# Patient Record
Sex: Female | Born: 1945
Health system: Southern US, Community
[De-identification: ages and names within clinical notes are randomized; demographics above are authoritative.]

## PROBLEM LIST (undated history)

## (undated) DIAGNOSIS — M419 Scoliosis, unspecified: Secondary | ICD-10-CM

## (undated) DIAGNOSIS — N368 Other specified disorders of urethra: Secondary | ICD-10-CM

## (undated) DIAGNOSIS — N3941 Urge incontinence: Secondary | ICD-10-CM

## (undated) DIAGNOSIS — Z8489 Family history of other specified conditions: Secondary | ICD-10-CM

## (undated) DIAGNOSIS — B0229 Other postherpetic nervous system involvement: Secondary | ICD-10-CM

## (undated) DIAGNOSIS — H59023 Cataract (lens) fragments in eye following cataract surgery, bilateral: Secondary | ICD-10-CM

## (undated) DIAGNOSIS — Z8719 Personal history of other diseases of the digestive system: Secondary | ICD-10-CM

## (undated) DIAGNOSIS — K5190 Ulcerative colitis, unspecified, without complications: Secondary | ICD-10-CM

## (undated) DIAGNOSIS — Z9889 Other specified postprocedural states: Secondary | ICD-10-CM

## (undated) DIAGNOSIS — K210 Gastro-esophageal reflux disease with esophagitis: Secondary | ICD-10-CM

## (undated) DIAGNOSIS — N393 Stress incontinence (female) (male): Secondary | ICD-10-CM

## (undated) DIAGNOSIS — R51 Headache: Secondary | ICD-10-CM

## (undated) DIAGNOSIS — K5792 Diverticulitis of intestine, part unspecified, without perforation or abscess without bleeding: Secondary | ICD-10-CM

## (undated) DIAGNOSIS — G43909 Migraine, unspecified, not intractable, without status migrainosus: Secondary | ICD-10-CM

## (undated) DIAGNOSIS — K219 Gastro-esophageal reflux disease without esophagitis: Secondary | ICD-10-CM

## (undated) DIAGNOSIS — M4120 Other idiopathic scoliosis, site unspecified: Secondary | ICD-10-CM

## (undated) DIAGNOSIS — Z780 Asymptomatic menopausal state: Secondary | ICD-10-CM

## (undated) DIAGNOSIS — R519 Headache, unspecified: Secondary | ICD-10-CM

## (undated) DIAGNOSIS — I493 Ventricular premature depolarization: Secondary | ICD-10-CM

## (undated) DIAGNOSIS — I10 Essential (primary) hypertension: Secondary | ICD-10-CM

## (undated) DIAGNOSIS — R339 Retention of urine, unspecified: Secondary | ICD-10-CM

## (undated) DIAGNOSIS — E0590 Thyrotoxicosis, unspecified without thyrotoxic crisis or storm: Secondary | ICD-10-CM

## (undated) DIAGNOSIS — M7060 Trochanteric bursitis, unspecified hip: Secondary | ICD-10-CM

## (undated) DIAGNOSIS — L570 Actinic keratosis: Secondary | ICD-10-CM

## (undated) DIAGNOSIS — C801 Malignant (primary) neoplasm, unspecified: Secondary | ICD-10-CM

## (undated) DIAGNOSIS — F419 Anxiety disorder, unspecified: Secondary | ICD-10-CM

## (undated) DIAGNOSIS — D649 Anemia, unspecified: Secondary | ICD-10-CM

## (undated) DIAGNOSIS — R112 Nausea with vomiting, unspecified: Secondary | ICD-10-CM

## (undated) DIAGNOSIS — M1990 Unspecified osteoarthritis, unspecified site: Secondary | ICD-10-CM

## (undated) HISTORY — DX: Actinic keratosis: L57.0

## (undated) HISTORY — DX: Migraine, unspecified, not intractable, without status migrainosus: G43.909

## (undated) HISTORY — PX: BACK SURGERY: SHX140

## (undated) HISTORY — DX: Gastro-esophageal reflux disease with esophagitis: K21.0

## (undated) HISTORY — DX: Scoliosis, unspecified: M41.9

## (undated) HISTORY — DX: Trochanteric bursitis, unspecified hip: M70.60

## (undated) HISTORY — PX: COLON SURGERY: SHX602

## (undated) HISTORY — DX: Ulcerative colitis, unspecified, without complications: K51.90

## (undated) HISTORY — DX: Stress incontinence (female) (male): N39.3

## (undated) HISTORY — PX: EYE SURGERY: SHX253

## (undated) HISTORY — DX: Other specified disorders of urethra: N36.8

## (undated) HISTORY — PX: CARPAL TUNNEL RELEASE: SHX101

## (undated) HISTORY — DX: Urge incontinence: N39.41

## (undated) HISTORY — DX: Retention of urine, unspecified: R33.9

## (undated) HISTORY — PX: CATARACT EXTRACTION W/ INTRAOCULAR LENS  IMPLANT, BILATERAL: SHX1307

## (undated) HISTORY — DX: Other idiopathic scoliosis, site unspecified: M41.20

## (undated) HISTORY — DX: Asymptomatic menopausal state: Z78.0

## (undated) HISTORY — PX: ABDOMINAL HYSTERECTOMY: SHX81

## (undated) HISTORY — DX: Gastro-esophageal reflux disease without esophagitis: K21.9

## (undated) HISTORY — PX: TONSILLECTOMY: SUR1361

---

## 2002-03-30 HISTORY — PX: LUMBAR LAMINECTOMY: SHX95

## 2003-12-29 ENCOUNTER — Encounter: Payer: Self-pay | Admitting: Internal Medicine

## 2004-01-29 ENCOUNTER — Encounter: Payer: Self-pay | Admitting: Internal Medicine

## 2004-02-28 ENCOUNTER — Encounter: Payer: Self-pay | Admitting: Internal Medicine

## 2004-03-30 ENCOUNTER — Encounter: Payer: Self-pay | Admitting: Internal Medicine

## 2004-04-30 ENCOUNTER — Encounter: Payer: Self-pay | Admitting: Internal Medicine

## 2004-05-28 ENCOUNTER — Encounter: Payer: Self-pay | Admitting: Internal Medicine

## 2004-06-28 ENCOUNTER — Encounter: Payer: Self-pay | Admitting: Internal Medicine

## 2004-07-18 ENCOUNTER — Ambulatory Visit: Payer: Self-pay | Admitting: Internal Medicine

## 2004-07-28 ENCOUNTER — Encounter: Payer: Self-pay | Admitting: Internal Medicine

## 2004-08-28 ENCOUNTER — Encounter: Payer: Self-pay | Admitting: Internal Medicine

## 2004-09-27 ENCOUNTER — Encounter: Payer: Self-pay | Admitting: Internal Medicine

## 2004-10-07 ENCOUNTER — Ambulatory Visit: Payer: Self-pay | Admitting: Unknown Physician Specialty

## 2004-10-28 ENCOUNTER — Encounter: Payer: Self-pay | Admitting: Internal Medicine

## 2004-11-28 ENCOUNTER — Encounter: Payer: Self-pay | Admitting: Internal Medicine

## 2004-12-28 ENCOUNTER — Encounter: Payer: Self-pay | Admitting: Internal Medicine

## 2005-01-22 ENCOUNTER — Observation Stay: Payer: Self-pay | Admitting: Internal Medicine

## 2005-01-22 ENCOUNTER — Other Ambulatory Visit: Payer: Self-pay

## 2005-01-27 ENCOUNTER — Encounter: Payer: Self-pay | Admitting: Internal Medicine

## 2005-01-28 ENCOUNTER — Encounter: Payer: Self-pay | Admitting: Internal Medicine

## 2005-02-27 ENCOUNTER — Encounter: Payer: Self-pay | Admitting: Internal Medicine

## 2005-03-30 ENCOUNTER — Encounter: Payer: Self-pay | Admitting: Internal Medicine

## 2005-07-10 ENCOUNTER — Ambulatory Visit: Payer: Self-pay | Admitting: Internal Medicine

## 2005-11-06 ENCOUNTER — Ambulatory Visit: Payer: Self-pay | Admitting: Unknown Physician Specialty

## 2005-11-06 HISTORY — PX: COLONOSCOPY: SHX174

## 2005-11-06 HISTORY — PX: ESOPHAGOGASTRODUODENOSCOPY: SHX1529

## 2006-07-28 ENCOUNTER — Ambulatory Visit: Payer: Self-pay | Admitting: Internal Medicine

## 2007-03-18 ENCOUNTER — Ambulatory Visit: Payer: Self-pay

## 2007-03-31 HISTORY — PX: POSTERIOR LAMINECTOMY / DECOMPRESSION LUMBAR SPINE: SUR740

## 2007-08-10 ENCOUNTER — Ambulatory Visit: Payer: Self-pay | Admitting: Internal Medicine

## 2008-01-26 ENCOUNTER — Ambulatory Visit: Payer: Self-pay | Admitting: Specialist

## 2008-02-12 ENCOUNTER — Emergency Department: Payer: Self-pay | Admitting: Emergency Medicine

## 2008-03-07 ENCOUNTER — Ambulatory Visit: Payer: Self-pay | Admitting: Specialist

## 2008-03-30 HISTORY — PX: COLONOSCOPY: SHX174

## 2008-06-22 ENCOUNTER — Ambulatory Visit: Payer: Self-pay | Admitting: Ophthalmology

## 2008-08-16 ENCOUNTER — Ambulatory Visit: Payer: Self-pay | Admitting: Internal Medicine

## 2008-09-11 ENCOUNTER — Ambulatory Visit: Payer: Self-pay | Admitting: Ophthalmology

## 2008-12-12 ENCOUNTER — Inpatient Hospital Stay: Payer: Self-pay | Admitting: Internal Medicine

## 2009-01-23 ENCOUNTER — Ambulatory Visit: Payer: Self-pay | Admitting: Unknown Physician Specialty

## 2009-01-24 ENCOUNTER — Inpatient Hospital Stay: Payer: Self-pay | Admitting: Unknown Physician Specialty

## 2009-02-08 ENCOUNTER — Ambulatory Visit: Payer: Self-pay | Admitting: Unknown Physician Specialty

## 2009-03-13 ENCOUNTER — Emergency Department: Payer: Self-pay | Admitting: Emergency Medicine

## 2009-08-29 ENCOUNTER — Ambulatory Visit: Payer: Self-pay | Admitting: Internal Medicine

## 2009-10-11 ENCOUNTER — Other Ambulatory Visit: Payer: Self-pay

## 2010-02-19 ENCOUNTER — Other Ambulatory Visit: Payer: Self-pay | Admitting: Unknown Physician Specialty

## 2010-02-19 ENCOUNTER — Other Ambulatory Visit: Payer: Self-pay | Admitting: Internal Medicine

## 2010-08-14 ENCOUNTER — Other Ambulatory Visit: Payer: Self-pay | Admitting: Internal Medicine

## 2010-09-05 ENCOUNTER — Ambulatory Visit: Payer: Self-pay | Admitting: Internal Medicine

## 2010-09-07 ENCOUNTER — Ambulatory Visit: Payer: Self-pay | Admitting: Physical Medicine and Rehabilitation

## 2010-09-16 ENCOUNTER — Ambulatory Visit: Payer: Self-pay | Admitting: Pain Medicine

## 2010-10-06 ENCOUNTER — Ambulatory Visit: Payer: Self-pay | Admitting: Pain Medicine

## 2010-10-09 ENCOUNTER — Ambulatory Visit: Payer: Self-pay | Admitting: Pain Medicine

## 2010-10-29 ENCOUNTER — Ambulatory Visit: Payer: Self-pay | Admitting: Pain Medicine

## 2010-10-30 ENCOUNTER — Ambulatory Visit: Payer: Self-pay | Admitting: Pain Medicine

## 2010-11-17 ENCOUNTER — Ambulatory Visit: Payer: Self-pay | Admitting: Pain Medicine

## 2011-01-08 ENCOUNTER — Ambulatory Visit: Payer: Self-pay | Admitting: Pain Medicine

## 2011-02-09 ENCOUNTER — Ambulatory Visit: Payer: Self-pay | Admitting: Pain Medicine

## 2011-02-12 ENCOUNTER — Ambulatory Visit: Payer: Self-pay | Admitting: Pain Medicine

## 2011-03-04 ENCOUNTER — Ambulatory Visit: Payer: Self-pay | Admitting: Pain Medicine

## 2011-03-12 ENCOUNTER — Ambulatory Visit: Payer: Self-pay | Admitting: Pain Medicine

## 2011-03-31 DIAGNOSIS — H59023 Cataract (lens) fragments in eye following cataract surgery, bilateral: Secondary | ICD-10-CM

## 2011-03-31 HISTORY — PX: COLONOSCOPY: SHX174

## 2011-03-31 HISTORY — DX: Cataract (lens) fragments in eye following cataract surgery, bilateral: H59.023

## 2011-04-25 ENCOUNTER — Other Ambulatory Visit: Payer: Self-pay | Admitting: Unknown Physician Specialty

## 2011-04-27 LAB — CLOSTRIDIUM DIFFICILE BY PCR

## 2011-05-06 ENCOUNTER — Ambulatory Visit: Payer: Self-pay | Admitting: Internal Medicine

## 2011-05-18 ENCOUNTER — Ambulatory Visit: Payer: Self-pay | Admitting: Pain Medicine

## 2011-09-23 ENCOUNTER — Ambulatory Visit: Payer: Self-pay | Admitting: Internal Medicine

## 2011-11-04 ENCOUNTER — Ambulatory Visit: Payer: Self-pay | Admitting: Internal Medicine

## 2012-01-04 ENCOUNTER — Other Ambulatory Visit: Payer: Self-pay | Admitting: Unknown Physician Specialty

## 2012-02-12 ENCOUNTER — Ambulatory Visit: Payer: Self-pay | Admitting: Unknown Physician Specialty

## 2012-03-29 ENCOUNTER — Other Ambulatory Visit: Payer: Self-pay | Admitting: Physician Assistant

## 2012-03-29 LAB — COMPREHENSIVE METABOLIC PANEL
BUN: 17 mg/dL (ref 7–18)
Bilirubin,Total: 0.3 mg/dL (ref 0.2–1.0)
Chloride: 110 mmol/L — ABNORMAL HIGH (ref 98–107)
Co2: 20 mmol/L — ABNORMAL LOW (ref 21–32)
Creatinine: 0.74 mg/dL (ref 0.60–1.30)
EGFR (African American): >60
EGFR (Non-African Amer.): >60
Glucose: 70 mg/dL (ref 65–99)
Osmolality: 279 (ref 275–301)
SGOT(AST): 25 U/L (ref 15–37)
SGPT (ALT): 29 U/L (ref 12–78)
Sodium: 140 mmol/L (ref 136–145)
Total Protein: 7.5 g/dL (ref 6.4–8.2)

## 2012-03-29 LAB — CBC WITH DIFFERENTIAL/PLATELET
Basophil %: 1 %
Eosinophil #: 0.2 10*3/uL (ref 0.0–0.7)
Eosinophil %: 1.5 %
HCT: 43.2 % (ref 35.0–47.0)
HGB: 14.7 g/dL (ref 12.0–16.0)
Lymphocyte #: 1.3 10*3/uL (ref 1.0–3.6)
Lymphocyte %: 11.7 %
Neutrophil %: 75 %
RDW: 13.3 % (ref 11.5–14.5)

## 2012-03-29 LAB — LIPASE, BLOOD: Lipase: 136 U/L (ref 73–393)

## 2012-08-02 ENCOUNTER — Emergency Department: Payer: Self-pay | Admitting: Emergency Medicine

## 2012-09-28 ENCOUNTER — Ambulatory Visit: Payer: Self-pay | Admitting: Internal Medicine

## 2012-12-31 ENCOUNTER — Emergency Department: Payer: Self-pay | Admitting: Emergency Medicine

## 2012-12-31 LAB — HEMOGLOBIN: HGB: 13.5 g/dL (ref 12.0–16.0)

## 2013-03-13 DIAGNOSIS — C4492 Squamous cell carcinoma of skin, unspecified: Secondary | ICD-10-CM

## 2013-03-13 HISTORY — DX: Squamous cell carcinoma of skin, unspecified: C44.92

## 2013-05-15 ENCOUNTER — Ambulatory Visit: Payer: Self-pay | Admitting: Pain Medicine

## 2013-05-18 ENCOUNTER — Ambulatory Visit: Payer: Self-pay | Admitting: Pain Medicine

## 2013-05-18 LAB — CBC WITH DIFFERENTIAL/PLATELET
Basophil #: 0.1 10*3/uL (ref 0.0–0.1)
Basophil %: 1.1 %
EOS PCT: 0.8 %
Eosinophil #: 0.1 10*3/uL (ref 0.0–0.7)
HCT: 41.1 % (ref 35.0–47.0)
HGB: 13.7 g/dL (ref 12.0–16.0)
LYMPHS PCT: 16.1 %
Lymphocyte #: 2 10*3/uL (ref 1.0–3.6)
MCH: 29.3 pg (ref 26.0–34.0)
MCHC: 33.4 g/dL (ref 32.0–36.0)
MCV: 88 fL (ref 80–100)
MONO ABS: 1.2 x10 3/mm — AB (ref 0.2–0.9)
Monocyte %: 9.7 %
NEUTROS ABS: 8.9 10*3/uL — AB (ref 1.4–6.5)
Neutrophil %: 72.3 %
Platelet: 372 10*3/uL (ref 150–440)
RBC: 4.69 10*6/uL (ref 3.80–5.20)
RDW: 14 % (ref 11.5–14.5)
WBC: 12.3 10*3/uL — ABNORMAL HIGH (ref 3.6–11.0)

## 2013-05-18 LAB — MAGNESIUM: Magnesium: 1.9 mg/dL

## 2013-05-29 ENCOUNTER — Ambulatory Visit: Payer: Self-pay | Admitting: Pain Medicine

## 2013-06-28 ENCOUNTER — Ambulatory Visit: Payer: Self-pay | Admitting: General Practice

## 2013-07-26 DIAGNOSIS — M4120 Other idiopathic scoliosis, site unspecified: Secondary | ICD-10-CM

## 2013-07-26 DIAGNOSIS — K5190 Ulcerative colitis, unspecified, without complications: Secondary | ICD-10-CM | POA: Insufficient documentation

## 2013-07-26 DIAGNOSIS — G43909 Migraine, unspecified, not intractable, without status migrainosus: Secondary | ICD-10-CM | POA: Insufficient documentation

## 2013-07-26 HISTORY — DX: Other idiopathic scoliosis, site unspecified: M41.20

## 2013-07-26 HISTORY — DX: Migraine, unspecified, not intractable, without status migrainosus: G43.909

## 2013-08-09 DIAGNOSIS — N393 Stress incontinence (female) (male): Secondary | ICD-10-CM | POA: Insufficient documentation

## 2013-08-09 DIAGNOSIS — N368 Other specified disorders of urethra: Secondary | ICD-10-CM | POA: Insufficient documentation

## 2013-08-09 DIAGNOSIS — N3941 Urge incontinence: Secondary | ICD-10-CM | POA: Insufficient documentation

## 2013-08-09 HISTORY — DX: Other specified disorders of urethra: N36.8

## 2013-08-09 HISTORY — DX: Urge incontinence: N39.41

## 2013-08-09 HISTORY — DX: Stress incontinence (female) (male): N39.3

## 2013-10-09 ENCOUNTER — Ambulatory Visit: Payer: Self-pay | Admitting: Anesthesiology

## 2013-10-17 ENCOUNTER — Ambulatory Visit: Payer: Self-pay | Admitting: Urology

## 2013-10-19 LAB — PATHOLOGY REPORT

## 2013-10-29 DIAGNOSIS — R339 Retention of urine, unspecified: Secondary | ICD-10-CM | POA: Insufficient documentation

## 2013-10-29 HISTORY — DX: Retention of urine, unspecified: R33.9

## 2013-11-06 ENCOUNTER — Ambulatory Visit: Payer: Self-pay | Admitting: Internal Medicine

## 2014-03-30 DIAGNOSIS — C801 Malignant (primary) neoplasm, unspecified: Secondary | ICD-10-CM

## 2014-03-30 HISTORY — DX: Malignant (primary) neoplasm, unspecified: C80.1

## 2014-07-21 NOTE — Op Note (Signed)
PATIENT NAME:  April Larsen, LOSH MR#:  [id number] DATE OF BIRTH:  Oct 18, 1945  DATE OF PROCEDURE:  10/17/2013  PREOPERATIVE DIAGNOSIS: Urethral prolapse.   POSTOPERATIVE DIAGNOSIS: Urethral prolapse.  PROCEDURE: Urethral prolapse excision.   SURGEON: Edrick Oh, M.D.   ANESTHESIA: Laryngeal mask airway anesthesia.   INDICATIONS: The patient is a 69 year old white female who noted a bulging area in the vaginal region with bleeding. On physical examination she was noted to have a urethral prolapse with some inflammation. There has been some reduction in the degree of inflammation. She has had no significant difficulty with urination as a result of the prolapse. She presents for urethral prolapse excision.   DESCRIPTION OF PROCEDURE: After informed consent was obtained, the patient was taken to the operating room and placed in the dorsal lithotomy position under laryngeal mask airway anesthesia. The patient was then prepped and draped in the usual standard fashion. A 16 French Foley catheter was placed to gravity drainage without difficulty. An approximately 1-cm prolapse was noted predominantly at the ventral aspect of the urethral meatus. This did extend circumferentially with an approximately 3-mm extension anterior.  The anterior midline of the prolapsed tissue was incised utilizing electrocautery beginning on the left-hand side. Small sections of the skin edge connection and subsequent urethral tissue were incised. Interrupted 5-0 Monocryl sutures were placed in various close proximity as the tissue was excised circumferentially.   No significant bleeding was encountered during the excision. The resection was continued circumferentially removing the entire section of urethral prolapse back to the 12 o'clock position. Multiple interrupted 5-0 Monocryl sutures were placed. Complete removal of the prolapse was achieved. The section of urethral prolapse was sent to pathology for further evaluation.  Estrogen cream was then applied to the urethral meatus. The Foley catheter was left to gravity drainage. The patient was returned to the supine position. She was awakened from laryngeal mask airway anesthesia. She was taken to the recovery room in stable condition. There were no problems or complications. The patient tolerated the procedure well.   ESTIMATED BLOOD LOSS: Approximately 50 mL    ____________________________ Denice Bors. Jacqlyn Larsen, MD bsc:lt D: 10/17/2013 39:03:00 ET T: 10/17/2013 10:56:28 ET JOB#: [id number]  cc: Denice Bors. Jacqlyn Larsen, MD, <Dictator> Denice Bors Henessy Rohrer MD ELECTRONICALLY SIGNED 10/24/2013 7:19

## 2014-08-21 ENCOUNTER — Other Ambulatory Visit: Payer: Self-pay | Admitting: Internal Medicine

## 2014-08-21 DIAGNOSIS — M5417 Radiculopathy, lumbosacral region: Secondary | ICD-10-CM

## 2014-08-24 ENCOUNTER — Ambulatory Visit
Admission: RE | Admit: 2014-08-24 | Discharge: 2014-08-24 | Disposition: A | Discharge status: Home / Self Care | Payer: 59 | Source: Ambulatory Visit | Attending: Internal Medicine | Admitting: Internal Medicine

## 2014-08-24 DIAGNOSIS — M5386 Other specified dorsopathies, lumbar region: Secondary | ICD-10-CM | POA: Diagnosis not present

## 2014-08-24 DIAGNOSIS — M5387 Other specified dorsopathies, lumbosacral region: Secondary | ICD-10-CM | POA: Insufficient documentation

## 2014-08-24 DIAGNOSIS — M5417 Radiculopathy, lumbosacral region: Secondary | ICD-10-CM

## 2014-08-24 DIAGNOSIS — M4180 Other forms of scoliosis, site unspecified: Secondary | ICD-10-CM | POA: Diagnosis not present

## 2014-08-24 DIAGNOSIS — M5416 Radiculopathy, lumbar region: Secondary | ICD-10-CM | POA: Diagnosis present

## 2014-10-04 DIAGNOSIS — K2100 Gastro-esophageal reflux disease with esophagitis, without bleeding: Secondary | ICD-10-CM

## 2014-10-04 HISTORY — DX: Gastro-esophageal reflux disease with esophagitis, without bleeding: K21.00

## 2014-10-24 ENCOUNTER — Other Ambulatory Visit: Payer: Self-pay | Admitting: Internal Medicine

## 2014-10-24 DIAGNOSIS — Z1231 Encounter for screening mammogram for malignant neoplasm of breast: Secondary | ICD-10-CM

## 2014-11-08 ENCOUNTER — Other Ambulatory Visit: Payer: Self-pay | Admitting: Internal Medicine

## 2014-11-08 ENCOUNTER — Ambulatory Visit
Admission: RE | Admit: 2014-11-08 | Discharge: 2014-11-08 | Disposition: A | Discharge status: Home / Self Care | Payer: 59 | Source: Ambulatory Visit | Attending: Internal Medicine | Admitting: Internal Medicine

## 2014-11-08 DIAGNOSIS — Z1231 Encounter for screening mammogram for malignant neoplasm of breast: Secondary | ICD-10-CM | POA: Diagnosis not present

## 2014-11-08 HISTORY — DX: Malignant (primary) neoplasm, unspecified: C80.1

## 2014-11-29 DIAGNOSIS — M7060 Trochanteric bursitis, unspecified hip: Secondary | ICD-10-CM

## 2014-11-29 HISTORY — DX: Trochanteric bursitis, unspecified hip: M70.60

## 2015-01-18 DIAGNOSIS — M419 Scoliosis, unspecified: Secondary | ICD-10-CM | POA: Insufficient documentation

## 2015-01-18 DIAGNOSIS — M4126 Other idiopathic scoliosis, lumbar region: Secondary | ICD-10-CM | POA: Insufficient documentation

## 2015-01-18 HISTORY — DX: Scoliosis, unspecified: M41.9

## 2015-07-10 DIAGNOSIS — D692 Other nonthrombocytopenic purpura: Secondary | ICD-10-CM | POA: Diagnosis not present

## 2015-07-10 DIAGNOSIS — K130 Diseases of lips: Secondary | ICD-10-CM | POA: Diagnosis not present

## 2015-07-10 DIAGNOSIS — L578 Other skin changes due to chronic exposure to nonionizing radiation: Secondary | ICD-10-CM | POA: Diagnosis not present

## 2015-07-10 DIAGNOSIS — L821 Other seborrheic keratosis: Secondary | ICD-10-CM | POA: Diagnosis not present

## 2015-07-10 DIAGNOSIS — Z85828 Personal history of other malignant neoplasm of skin: Secondary | ICD-10-CM | POA: Diagnosis not present

## 2015-07-10 DIAGNOSIS — L820 Inflamed seborrheic keratosis: Secondary | ICD-10-CM | POA: Diagnosis not present

## 2015-07-10 DIAGNOSIS — Z1283 Encounter for screening for malignant neoplasm of skin: Secondary | ICD-10-CM | POA: Diagnosis not present

## 2015-08-22 DIAGNOSIS — N3001 Acute cystitis with hematuria: Secondary | ICD-10-CM | POA: Diagnosis not present

## 2015-08-24 ENCOUNTER — Encounter: Payer: Self-pay | Admitting: Emergency Medicine

## 2015-08-24 ENCOUNTER — Emergency Department: Payer: 59

## 2015-08-24 ENCOUNTER — Emergency Department
Admission: EM | Admit: 2015-08-24 | Discharge: 2015-08-24 | Disposition: A | Discharge status: Home / Self Care | Payer: 59 | Attending: Emergency Medicine | Admitting: Emergency Medicine

## 2015-08-24 DIAGNOSIS — Z79899 Other long term (current) drug therapy: Secondary | ICD-10-CM | POA: Diagnosis not present

## 2015-08-24 DIAGNOSIS — Z85828 Personal history of other malignant neoplasm of skin: Secondary | ICD-10-CM | POA: Insufficient documentation

## 2015-08-24 DIAGNOSIS — K5732 Diverticulitis of large intestine without perforation or abscess without bleeding: Secondary | ICD-10-CM | POA: Diagnosis not present

## 2015-08-24 DIAGNOSIS — R000 Tachycardia, unspecified: Secondary | ICD-10-CM | POA: Diagnosis not present

## 2015-08-24 DIAGNOSIS — K5792 Diverticulitis of intestine, part unspecified, without perforation or abscess without bleeding: Secondary | ICD-10-CM

## 2015-08-24 DIAGNOSIS — R1084 Generalized abdominal pain: Secondary | ICD-10-CM | POA: Diagnosis not present

## 2015-08-24 DIAGNOSIS — R1013 Epigastric pain: Secondary | ICD-10-CM | POA: Diagnosis not present

## 2015-08-24 LAB — COMPREHENSIVE METABOLIC PANEL
ALT: 19 U/L (ref 14–54)
AST: 19 U/L (ref 15–41)
Albumin: 4.1 g/dL (ref 3.5–5.0)
Alkaline Phosphatase: 82 U/L (ref 38–126)
Anion gap: 11 (ref 5–15)
BUN: 21 mg/dL — AB (ref 6–20)
CHLORIDE: 103 mmol/L (ref 101–111)
CO2: 22 mmol/L (ref 22–32)
CREATININE: 1.08 mg/dL — AB (ref 0.44–1.00)
Calcium: 9.6 mg/dL (ref 8.9–10.3)
GFR calc Af Amer: 60 mL/min — ABNORMAL LOW (ref >60)
GFR calc non Af Amer: 52 mL/min — ABNORMAL LOW (ref >60)
Glucose, Bld: 127 mg/dL — ABNORMAL HIGH (ref 65–99)
POTASSIUM: 3.4 mmol/L — AB (ref 3.5–5.1)
SODIUM: 136 mmol/L (ref 135–145)
Total Bilirubin: 1 mg/dL (ref 0.3–1.2)
Total Protein: 7.5 g/dL (ref 6.5–8.1)

## 2015-08-24 LAB — CBC
HEMATOCRIT: 40.1 % (ref 35.0–47.0)
Hemoglobin: 13.7 g/dL (ref 12.0–16.0)
MCH: 30.3 pg (ref 26.0–34.0)
MCHC: 34.1 g/dL (ref 32.0–36.0)
MCV: 88.9 fL (ref 80.0–100.0)
PLATELETS: 314 10*3/uL (ref 150–440)
RBC: 4.51 MIL/uL (ref 3.80–5.20)
RDW: 12.5 % (ref 11.5–14.5)
WBC: 17.6 10*3/uL — ABNORMAL HIGH (ref 3.6–11.0)

## 2015-08-24 LAB — URINALYSIS COMPLETE WITH MICROSCOPIC (ARMC ONLY)
BACTERIA UA: NONE SEEN
Bilirubin Urine: NEGATIVE
Glucose, UA: NEGATIVE mg/dL
Hgb urine dipstick: NEGATIVE
Nitrite: NEGATIVE
PROTEIN: NEGATIVE mg/dL
Specific Gravity, Urine: 1.015 (ref 1.005–1.030)
pH: 5 (ref 5.0–8.0)

## 2015-08-24 LAB — LIPASE, BLOOD: LIPASE: 21 U/L (ref 11–51)

## 2015-08-24 LAB — TROPONIN I

## 2015-08-24 MED ORDER — METRONIDAZOLE 500 MG PO TABS: 500.0000 mg | ORAL_TABLET | Freq: Two times a day (BID) | ORAL | Status: DC

## 2015-08-24 MED ORDER — IOPAMIDOL (ISOVUE-300) INJECTION 61%
100.0000 mL | Freq: Once | INTRAVENOUS | Status: AC | PRN
  Administered 2015-08-24: 100 mL via INTRAVENOUS

## 2015-08-24 MED ORDER — DIATRIZOATE MEGLUMINE & SODIUM 66-10 % PO SOLN
15.0000 mL | Freq: Once | ORAL | Status: AC
  Administered 2015-08-24: 15 mL via ORAL

## 2015-08-24 MED ORDER — METRONIDAZOLE 500 MG PO TABS
500.0000 mg | ORAL_TABLET | Freq: Once | ORAL | Status: AC
  Administered 2015-08-24: 500 mg via ORAL
  Filled 2015-08-24: qty 1

## 2015-08-24 MED ORDER — ONDANSETRON HCL 4 MG PO TABS: 4.0000 mg | ORAL_TABLET | Freq: Three times a day (TID) | ORAL | Status: DC | PRN

## 2015-08-24 MED ORDER — CIPROFLOXACIN HCL 500 MG PO TABS
500.0000 mg | ORAL_TABLET | Freq: Once | ORAL | Status: AC
  Administered 2015-08-24: 500 mg via ORAL
  Filled 2015-08-24: qty 1

## 2015-08-24 MED ORDER — CIPROFLOXACIN HCL 500 MG PO TABS: 500.0000 mg | ORAL_TABLET | Freq: Two times a day (BID) | ORAL | Status: DC

## 2015-08-24 MED ORDER — HYDROCODONE-ACETAMINOPHEN 5-325 MG PO TABS: 1.0000 | ORAL_TABLET | Freq: Four times a day (QID) | ORAL | Status: DC | PRN

## 2015-08-24 MED ORDER — SODIUM CHLORIDE 0.9 % IV BOLUS (SEPSIS)
1000.0000 mL | Freq: Once | INTRAVENOUS | Status: AC
  Administered 2015-08-24: 1000 mL via INTRAVENOUS

## 2015-08-24 NOTE — ED Notes (Signed)
Family at bedside. Iv started, IV bolus given. Pt denies needs at this time.

## 2015-08-24 NOTE — ED Notes (Signed)
Patient transported to X-ray 

## 2015-08-24 NOTE — ED Notes (Signed)
Pt presents to ED c/o epigastric pain and back pain for "4 years but the pain has been getting worse." Pt reports at times she feels like her throat is closing or tightening. Pt alert and oriented x 4, respirations even and unlabored, skin warm and dry. Son at bedside. Call bell within reach.

## 2015-08-24 NOTE — ED Notes (Signed)
Discussed discharge instructions, prescriptions, and follow-up care with patient. No questions or concerns at this time. Pt stable at discharge.  

## 2015-08-24 NOTE — Discharge Instructions (Signed)
You're being treated for diverticulitis with antibiotic Cipro and Flagyl. Return to the emergency room for any worsening condition including worsening abdominal pain, fever, concern for dehydration including not making urine, or any other symptoms concerning to you.   Diverticulitis Diverticulitis is when small pockets that have formed in your colon (large intestine) become infected or swollen. HOME CARE  Follow your doctor's instructions.  Follow a special diet if told by your doctor.  When you feel better, your doctor may tell you to change your diet. You may be told to eat a lot of fiber. Fruits and vegetables are good sources of fiber. Fiber makes it easier to poop (have bowel movements).  Take supplements or probiotics as told by your doctor.  Only take medicines as told by your doctor.  Keep all follow-up visits with your doctor. GET HELP IF:  Your pain does not get better.  You have a hard time eating food.  You are not pooping like normal. GET HELP RIGHT AWAY IF:  Your pain gets worse.  Your problems do not get better.  Your problems suddenly get worse.  You have a fever.  You keep throwing up (vomiting).  You have bloody or black, tarry poop (stool). MAKE SURE YOU:   Understand these instructions.  Will watch your condition.  Will get help right away if you are not doing well or get worse.   This information is not intended to replace advice given to you by your health care provider. Make sure you discuss any questions you have with your health care provider.   Document Released: 09/02/2007 Document Revised: 03/21/2013 Document Reviewed: 02/08/2013 Elsevier Interactive Patient Education [employer].

## 2015-08-24 NOTE — ED Notes (Signed)
Patient reports having mid back pain that radiates through to epigastric area with nausea.  Patient reports history of similar episodes that resolve on own.

## 2015-08-24 NOTE — ED Provider Notes (Signed)
[facility] Emergency Department Provider Note   ____________________________________________  Time seen:  I have reviewed the triage vital signs and the triage nursing note.  HISTORY  Chief Complaint Abdominal Pain   Historian Patient  HPI April Larsen is a 70 y.o. female with a history of prior episode of diverticulosis, is here for evaluation having intermittent epigastric and midabdominal nausea and pain. This episode has been ongoing for several days now, gradual but waxing and waning. Currently pain is only about a 2 or 3 out of 10. She states that the episode is lasting longer than prior episodes and that when she came in for evaluation. No fever or chills. She states that in the past when she's had similar episodes it often wakes her up at night or in the morning she notices that her throat is sore.  She states that today does not feel like indigestion to her. She denies chest pain. No palpitations or trouble breathing. No fever.  She states that she was told several years ago that she had gallstones, and she thinks that her symptoms may be due to gallstones.    Past Medical History  Diagnosis Date  . Cancer ([place])     skin    There are no active problems to display for this patient.   History reviewed. No pertinent past surgical history.  Current Outpatient Rx  Name  Route  Sig  Dispense  Refill  . budesonide (ENTOCORT EC) 3 MG 24 hr capsule   Oral   Take 3 mg by mouth 3 (three) times a week.         . calcium carbonate (OSCAL) 1500 (600 Ca) MG TABS tablet   Oral   Take 600 mg of elemental calcium by mouth 2 (two) times daily with a meal.         . cyclobenzaprine (FLEXERIL) 10 MG tablet   Oral   Take 10 mg by mouth daily.      3   . DULoxetine (CYMBALTA) 60 MG capsule   Oral   Take 60 mg by mouth daily.      3   . ESTRACE VAGINAL 0.1 MG/GM vaginal cream   Vaginal   Place 1 application vaginally daily.      11      Dispense as written.   . ferrous sulfate 325 (65 FE) MG tablet   Oral   Take 325 mg by mouth daily with breakfast.         . omeprazole (PRILOSEC) 20 MG capsule   Oral   Take 20 mg by mouth 2 (two) times daily.      3   . triamterene-hydrochlorothiazide (MAXZIDE) 75-50 MG tablet   Oral   Take 1 tablet by mouth daily.      99   . Vitamin D, Ergocalciferol, (DRISDOL) 50000 units CAPS capsule   Oral   Take 50,000 Units by mouth 3 (three) times a week.         . ciprofloxacin (CIPRO) 500 MG tablet   Oral   Take 1 tablet (500 mg total) by mouth 2 (two) times daily.   20 tablet   0   . lidocaine (LIDODERM) 5 %   Transdermal   Place 1 patch onto the skin daily.      3   . metroNIDAZOLE (FLAGYL) 500 MG tablet   Oral   Take 1 tablet (500 mg total) by mouth 2 (two) times daily.   20 tablet  0     Allergies Sulfa antibiotics  Family History  Problem Relation Age of Onset  . Breast cancer Sister 50  . Breast cancer Paternal Aunt 68    Social History Social History  Substance Use Topics  . Smoking status: Never Smoker   . Smokeless tobacco: None  . Alcohol Use: No    Review of Systems  Constitutional: Negative for fever. Eyes: Negative for visual changes. ENT: Negative for sore throat. Cardiovascular: Negative for chest pain. Respiratory: Negative for shortness of breath. Gastrointestinal: Negative for vomiting and diarrhea. Genitourinary: Negative for dysuria. Musculoskeletal: Negative for back pain. Skin: Negative for rash. Neurological: Negative for headache. 10 point Review of Systems otherwise negative ____________________________________________   PHYSICAL EXAM:  VITAL SIGNS: ED Triage Vitals  Enc Vitals Group     BP 08/24/15 0702 107/68 mmHg     Pulse Rate 08/24/15 0421 100     Resp 08/24/15 0421 22     Temp 08/24/15 0421 98.5 F (36.9 C)     Temp Source 08/24/15 0421 Oral     SpO2 08/24/15 0421 97 %     Weight 08/24/15 0421  180 lb (81.647 kg)     Height 08/24/15 0421 5' (1.524 m)     Head Cir --      Peak Flow --      Pain Score 08/24/15 0423 5     Pain Loc --      Pain Edu? --      Excl. in [place]? --      Constitutional: Alert and oriented. Well appearing and in no distress. HEENT   Head: Normocephalic and atraumatic.      Eyes: Conjunctivae are normal. PERRL. Normal extraocular movements.      Ears:         Nose: No congestion/rhinnorhea.   Mouth/Throat: Mucous membranes are moist.   Neck: No stridor. Cardiovascular/Chest: Normal rate, regular rhythm.  No murmurs, rubs, or gallops. Respiratory: Normal respiratory effort without tachypnea nor retractions. Breath sounds are clear and equal bilaterally. No wheezes/rales/rhonchi. Gastrointestinal: Soft. No distention, no guarding, no rebound. Moderate tenderness in the low epigastrium. No focal tenderness in the right upper quadrant. Some tenderness in the lower abdomen especially right-sided.  Genitourinary/rectal:Deferred Musculoskeletal: Nontender with normal range of motion in all extremities. No joint effusions.  No lower extremity tenderness.  No edema. Neurologic:  Normal speech and language. No gross or focal neurologic deficits are appreciated. Skin:  Skin is warm, dry and intact. No rash noted. Psychiatric: Mood and affect are normal. Speech and behavior are normal. Patient exhibits appropriate insight and judgment.  ____________________________________________   EKG I, Lisa Roca, MD, the attending physician have personally viewed and interpreted all ECGs.  101 bpm . tachycardic. Narrow QRS. Normal axis. Normal ST and T-wave ____________________________________________  LABS (pertinent positives/negatives)  Labs Reviewed  COMPREHENSIVE METABOLIC PANEL - Abnormal; Notable for the following:    Potassium 3.4 (*)    Glucose, Bld 127 (*)    BUN 21 (*)    Creatinine, Ser 1.08 (*)    GFR calc non Af Amer 52 (*)    GFR calc Af Amer  60 (*)    All other components within normal limits  CBC - Abnormal; Notable for the following:    WBC 17.6 (*)    All other components within normal limits  URINALYSIS COMPLETEWITH MICROSCOPIC ([facility] ONLY) - Abnormal; Notable for the following:    Color, Urine YELLOW (*)    APPearance HAZY (*)  Ketones, ur TRACE (*)    Leukocytes, UA TRACE (*)    Squamous Epithelial / LPF 6-30 (*)    All other components within normal limits  LIPASE, BLOOD  TROPONIN I    ____________________________________________  RADIOLOGY All Xrays were viewed by me. Imaging interpreted by Radiologist.  Chest and abdomen acute:IMPRESSION: No evidence of bowel obstruction or ileus. No acute cardiopulmonary Disease.  CT abdomen pelvis with contrast:  IMPRESSION: 1. Findings most concerning for acute diverticulitis of the sigmoid colon. Recommend follow-up colonoscopy (if the patient has not had a recent screening colonoscopy) following the resolution of the patient's acute symptoms to exclude any underlying lesion. __________________________________________  PROCEDURES  Procedure(s) performed: None  Critical Care performed: None  ____________________________________________   ED COURSE / ASSESSMENT AND PLAN  Pertinent labs & imaging results that were available during my care of the patient were reviewed by me and considered in my medical decision making (see chart for details).   Patient's here with acute ongoing abdominal pain which she described as epigastric and mid abdomen, but on exam also includes the lower abdomen. Patient was most wondering whether or not the gallbladder could be causing her symptoms, but our exam she has nonfocal right upper quadrant. Her white blood cell count was elevated, I discussed with her obtaining a CT for further evaluation of the abdomen.  If CT was negative, might consider ultrasound the gallbladder.  Urinalysis without evidence of acute urinary  infection. LFTs within normal limits, less likely obstructive biliary emergency.  CT of abdomen and pelvis is consistent with acute diverticulitis.  Although not the best imaging modality for the gallbladder, the visualized clot on the CT is normal. I am not suspicious of an acute emergency obstruction or infectious process with the gallbladder.  Patient we treated for acute diverticulitis. Patient is able to take by mouth. I discussed with her that without fever, tachycardia, or hypotension, I do think it's reasonable to allow her to be treated as an outpatient and follow-up with her doctor. She states she has an appointment on Tuesday.  She would like to go home rather than be admitted to the hospital for observation and management treatment.   CONSULTATIONS:   None   Patient / Family / Caregiver informed of clinical course, medical decision-making process, and agree with plan.   I discussed return precautions, follow-up instructions, and discharged instructions with patient and/or family.   ___________________________________________   FINAL CLINICAL IMPRESSION(S) / ED DIAGNOSES   Final diagnoses:  Acute diverticulitis              Note: This dictation was prepared with Dragon dictation. Any transcriptional errors that result from this process are unintentional   Lisa Roca, MD 08/24/15 1141

## 2015-08-24 NOTE — ED Notes (Signed)
Janean Sark (Son) [phone]

## 2015-08-27 DIAGNOSIS — K5792 Diverticulitis of intestine, part unspecified, without perforation or abscess without bleeding: Secondary | ICD-10-CM | POA: Diagnosis not present

## 2015-08-27 DIAGNOSIS — K5130 Ulcerative (chronic) rectosigmoiditis without complications: Secondary | ICD-10-CM | POA: Diagnosis not present

## 2015-08-31 ENCOUNTER — Encounter: Payer: Self-pay | Admitting: Emergency Medicine

## 2015-08-31 ENCOUNTER — Emergency Department
Admission: EM | Admit: 2015-08-31 | Discharge: 2015-08-31 | Disposition: A | Discharge status: Home / Self Care | Payer: PRIVATE HEALTH INSURANCE | Attending: Emergency Medicine | Admitting: Emergency Medicine

## 2015-08-31 DIAGNOSIS — S61211A Laceration without foreign body of left index finger without damage to nail, initial encounter: Secondary | ICD-10-CM | POA: Diagnosis present

## 2015-08-31 DIAGNOSIS — Z23 Encounter for immunization: Secondary | ICD-10-CM | POA: Insufficient documentation

## 2015-08-31 DIAGNOSIS — Y990 Civilian activity done for income or pay: Secondary | ICD-10-CM | POA: Diagnosis not present

## 2015-08-31 DIAGNOSIS — Z85828 Personal history of other malignant neoplasm of skin: Secondary | ICD-10-CM | POA: Insufficient documentation

## 2015-08-31 DIAGNOSIS — Y9389 Activity, other specified: Secondary | ICD-10-CM | POA: Insufficient documentation

## 2015-08-31 DIAGNOSIS — Y929 Unspecified place or not applicable: Secondary | ICD-10-CM | POA: Diagnosis not present

## 2015-08-31 DIAGNOSIS — W230XXA Caught, crushed, jammed, or pinched between moving objects, initial encounter: Secondary | ICD-10-CM | POA: Diagnosis not present

## 2015-08-31 DIAGNOSIS — S61219A Laceration without foreign body of unspecified finger without damage to nail, initial encounter: Secondary | ICD-10-CM

## 2015-08-31 DIAGNOSIS — S6722XA Crushing injury of left hand, initial encounter: Secondary | ICD-10-CM

## 2015-08-31 HISTORY — DX: Diverticulitis of intestine, part unspecified, without perforation or abscess without bleeding: K57.92

## 2015-08-31 MED ORDER — TETANUS-DIPHTH-ACELL PERTUSSIS 5-2.5-18.5 LF-MCG/0.5 IM SUSP
0.5000 mL | Freq: Once | INTRAMUSCULAR | Status: AC
  Administered 2015-08-31: 0.5 mL via INTRAMUSCULAR
  Filled 2015-08-31: qty 0.5

## 2015-08-31 NOTE — ED Provider Notes (Signed)
[facility] Emergency Department Provider Note ____________________________________________  Time seen: 0731  I have reviewed the triage vital signs and the nursing notes.  HISTORY  Chief Complaint  Laceration  HPI April Larsen is a 70 y.o. female to the ED for evaluation of a work-related injury sustained just prior to arrival. Patient describes laceration to the fat pad of her left index finger after she accidentally cut it on a wheeled stool, here at work. She fell off of the stool as she sat down, and her finger was rolled over by the wheel. She presents with a "u-shaped" skin tear laceration to the distal finger with bleeding controlled. She reports her tetanus is unknown at this time. Reports her pain at a 5/10 during the interview.  Past Medical History  Diagnosis Date  . Cancer ([place])     skin  . Diverticulitis     There are no active problems to display for this patient.   Past Surgical History  Procedure Laterality Date  . Carpal tunnel release    . Back surgery      Current Outpatient Rx  Name  Route  Sig  Dispense  Refill  . budesonide (ENTOCORT EC) 3 MG 24 hr capsule   Oral   Take 3 mg by mouth 3 (three) times a week.         . calcium carbonate (OSCAL) 1500 (600 Ca) MG TABS tablet   Oral   Take 600 mg of elemental calcium by mouth 2 (two) times daily with a meal.         . ciprofloxacin (CIPRO) 500 MG tablet   Oral   Take 1 tablet (500 mg total) by mouth 2 (two) times daily.   20 tablet   0   . cyclobenzaprine (FLEXERIL) 10 MG tablet   Oral   Take 10 mg by mouth daily.      3   . DULoxetine (CYMBALTA) 60 MG capsule   Oral   Take 60 mg by mouth daily.      3   . ESTRACE VAGINAL 0.1 MG/GM vaginal cream   Vaginal   Place 1 application vaginally daily.      11     Dispense as written.   . ferrous sulfate 325 (65 FE) MG tablet   Oral   Take 325 mg by mouth daily with breakfast.         .  HYDROcodone-acetaminophen (NORCO/VICODIN) 5-325 MG tablet   Oral   Take 1 tablet by mouth every 6 (six) hours as needed for moderate pain.   10 tablet   0   . lidocaine (LIDODERM) 5 %   Transdermal   Place 1 patch onto the skin daily.      3   . metroNIDAZOLE (FLAGYL) 500 MG tablet   Oral   Take 1 tablet (500 mg total) by mouth 2 (two) times daily.   20 tablet   0   . omeprazole (PRILOSEC) 20 MG capsule   Oral   Take 20 mg by mouth 2 (two) times daily.      3   . ondansetron (ZOFRAN) 4 MG tablet   Oral   Take 1 tablet (4 mg total) by mouth every 8 (eight) hours as needed for nausea or vomiting.   10 tablet   0   . triamterene-hydrochlorothiazide (MAXZIDE) 75-50 MG tablet   Oral   Take 1 tablet by mouth daily.      99   .  Vitamin D, Ergocalciferol, (DRISDOL) 50000 units CAPS capsule   Oral   Take 50,000 Units by mouth 3 (three) times a week.           Allergies Sulfa antibiotics  Family History  Problem Relation Age of Onset  . Breast cancer Sister 25  . Breast cancer Paternal Aunt 31    Social History Social History  Substance Use Topics  . Smoking status: Never Smoker   . Smokeless tobacco: Never Used  . Alcohol Use: No    Review of Systems  Constitutional: Negative for fever. Musculoskeletal: Negative for back pain. Right index finger injury as above Skin: Negative for rash. Neurological: Negative for headaches, focal weakness or numbness. ____________________________________________  PHYSICAL EXAM:  VITAL SIGNS: ED Triage Vitals  Enc Vitals Group     BP 08/31/15 0341 122/91 mmHg     Pulse Rate 08/31/15 0341 90     Resp 08/31/15 0341 18     Temp 08/31/15 0341 97.8 F (36.6 C)     Temp Source 08/31/15 0341 Oral     SpO2 08/31/15 0341 100 %     Weight 08/31/15 0341 185 lb (83.915 kg)     Height 08/31/15 0341 5' 5"  (1.651 m)     Head Cir --      Peak Flow --      Pain Score 08/31/15 0342 9     Pain Loc --      Pain Edu? --       Excl. in [place]? --     Constitutional: Alert and oriented. Well appearing and in no distress. Head: Normocephalic and atraumatic. Cardiovascular: Normal distal pulses and cap refill. Respiratory: Normal respiratory effort.  Musculoskeletal: Right index finger with distal edema and ecchymosis to the fat pad. Normal DIP ROM. Superficial skin tear to the distal fat pad. No active bleeding. Nontender with normal range of motion in all extremities.  Neurologic:  No gross focal neurologic deficits are appreciated. Skin:  Skin is warm, dry and intact. No rash noted. ____________________________________________  PROCEDURES  Tdap  LACERATION REPAIR Performed by: Melvenia Needles Authorized by: Melvenia Needles Consent: Verbal consent obtained. Risks and benefits: risks, benefits and alternatives were discussed Consent given by: patient Patient identity confirmed: provided demographic data Prepped and Draped in normal sterile fashion Wound explored  Laceration Location: right index finger tip  Laceration Length: 1 cm  No Foreign Bodies seen or palpated  Anesthesia: none  Irrigation method: gauze   Amount of cleaning: standard  Skin closure: wound closure - cyanoacrylate  Patient tolerance: Patient tolerated the procedure well with no immediate complications. ____________________________________________  INITIAL IMPRESSION / ASSESSMENT AND PLAN / ED COURSE  Patient with a superficial laceration s/p crush injury to the right index finger. Wound repaired with adhesive and tetanus boostered. Patient will follow-up with the employee health clinic as needed. RTW without restrictions.  ____________________________________________  FINAL CLINICAL IMPRESSION(S) / ED DIAGNOSES  Final diagnoses:  Crushing injury of finger of left hand, initial encounter  Finger laceration, initial encounter      Melvenia Needles, PA-C 08/31/15 [place],  MD 09/01/15 [phone]

## 2015-08-31 NOTE — Discharge Instructions (Signed)
Laceration Care, Adult A laceration is a cut that goes through all layers of the skin. The cut also goes into the tissue that is right under the skin. Some cuts heal on their own. Others need to be closed with stitches (sutures), staples, skin adhesive strips, or wound glue. Taking care of your cut lowers your risk of infection and helps your cut to heal better. HOW TO TAKE CARE OF YOUR CUT For stitches or staples:  Keep the wound clean and dry.  If you were given a bandage (dressing), you should change it at least one time per day or as told by your doctor. You should also change it if it gets wet or dirty.  Keep the wound completely dry for the first 24 hours or as told by your doctor. After that time, you may take a shower or a bath. However, make sure that the wound is not soaked in water until after the stitches or staples have been removed.  Clean the wound one time each day or as told by your doctor:  Wash the wound with soap and water.  Rinse the wound with water until all of the soap comes off.  Pat the wound dry with a clean towel. Do not rub the wound.  After you clean the wound, put a thin layer of antibiotic ointment on it as told by your doctor. This ointment:  Helps to prevent infection.  Keeps the bandage from sticking to the wound.  Have your stitches or staples removed as told by your doctor. If your doctor used skin adhesive strips:   Keep the wound clean and dry.  If you were given a bandage, you should change it at least one time per day or as told by your doctor. You should also change it if it gets dirty or wet.  Do not get the skin adhesive strips wet. You can take a shower or a bath, but be careful to keep the wound dry.  If the wound gets wet, pat it dry with a clean towel. Do not rub the wound.  Skin adhesive strips fall off on their own. You can trim the strips as the wound heals. Do not remove any strips that are still stuck to the wound. They will  fall off after a while. If your doctor used wound glue:  Try to keep your wound dry, but you may briefly wet it in the shower or bath. Do not soak the wound in water, such as by swimming.  After you take a shower or a bath, gently pat the wound dry with a clean towel. Do not rub the wound.  Do not do any activities that will make you really sweaty until the skin glue has fallen off on its own.  Do not apply liquid, cream, or ointment medicine to your wound while the skin glue is still on.  If you were given a bandage, you should change it at least one time per day or as told by your doctor. You should also change it if it gets dirty or wet.  If a bandage is placed over the wound, do not let the tape for the bandage touch the skin glue.  Do not pick at the glue. The skin glue usually stays on for 5-10 days. Then, it falls off of the skin. General Instructions  To help prevent scarring, make sure to cover your wound with sunscreen whenever you are outside after stitches are removed, after adhesive strips are removed,  or when wound glue stays in place and the wound is healed. Make sure to wear a sunscreen of at least 30 SPF.  Take over-the-counter and prescription medicines only as told by your doctor.  If you were given antibiotic medicine or ointment, take or apply it as told by your doctor. Do not stop using the antibiotic even if your wound is getting better.  Do not scratch or pick at the wound.  Keep all follow-up visits as told by your doctor. This is important.  Check your wound every day for signs of infection. Watch for:  Redness, swelling, or pain.  Fluid, blood, or pus.  Raise (elevate) the injured area above the level of your heart while you are sitting or lying down, if possible. GET HELP IF:  You got a tetanus shot and you have any of these problems at the injection site:  Swelling.  Very bad pain.  Redness.  Bleeding.  You have a fever.  A wound that was  closed breaks open.  You notice a bad smell coming from your wound or your bandage.  You notice something coming out of the wound, such as wood or glass.  Medicine does not help your pain.  You have more redness, swelling, or pain at the site of your wound.  You have fluid, blood, or pus coming from your wound.  You notice a change in the color of your skin near your wound.  You need to change the bandage often because fluid, blood, or pus is coming from the wound.  You start to have a new rash.  You start to have numbness around the wound. GET HELP RIGHT AWAY IF:  You have very bad swelling around the wound.  Your pain suddenly gets worse and is very bad.  You notice painful lumps near the wound or on skin that is anywhere on your body.  You have a red streak going away from your wound.  The wound is on your hand or foot and you cannot move a finger or toe like you usually can.  The wound is on your hand or foot and you notice that your fingers or toes look pale or bluish.   This information is not intended to replace advice given to you by your health care provider. Make sure you discuss any questions you have with your health care provider.   Document Released: 09/02/2007 Document Revised: 07/31/2014 Document Reviewed: 03/12/2014 Elsevier Interactive Patient Education 2016 [place] the wound clean, dry, and covered.

## 2015-08-31 NOTE — ED Notes (Signed)
Pt here with left index finger laceration. Pt states lacerated finger on chair at work approx 40 min pta. Pt is feeling faint from laceration. Pt lying in chair with head down and feet up. "u" shaped laceration with controlled bleeding noted. Dressing placed in triage.

## 2015-08-31 NOTE — ED Notes (Signed)
NAD noted at time of D/C. Pt denies questions or concerns. Pt ambulatory to the lobby at this time.  

## 2015-08-31 NOTE — ED Notes (Signed)
April V., (EDT) Preformed the BAT on pt in triage with Holli Humbles., (EDT) as a witness.

## 2015-09-02 ENCOUNTER — Ambulatory Visit: Payer: Self-pay | Admitting: Physician Assistant

## 2015-09-03 ENCOUNTER — Emergency Department
Admission: EM | Admit: 2015-09-03 | Discharge: 2015-09-03 | Disposition: A | Discharge status: Home / Self Care | Payer: 59 | Attending: Emergency Medicine | Admitting: Emergency Medicine

## 2015-09-03 ENCOUNTER — Encounter: Payer: Self-pay | Admitting: Physician Assistant

## 2015-09-03 ENCOUNTER — Emergency Department: Payer: 59

## 2015-09-03 ENCOUNTER — Ambulatory Visit: Payer: Self-pay | Admitting: Physician Assistant

## 2015-09-03 ENCOUNTER — Encounter: Payer: Self-pay | Admitting: Emergency Medicine

## 2015-09-03 VITALS — BP 140/80 | HR 76 | Temp 97.6°F

## 2015-09-03 DIAGNOSIS — Z85828 Personal history of other malignant neoplasm of skin: Secondary | ICD-10-CM | POA: Insufficient documentation

## 2015-09-03 DIAGNOSIS — Y9389 Activity, other specified: Secondary | ICD-10-CM | POA: Insufficient documentation

## 2015-09-03 DIAGNOSIS — Z79899 Other long term (current) drug therapy: Secondary | ICD-10-CM | POA: Insufficient documentation

## 2015-09-03 DIAGNOSIS — S79911A Unspecified injury of right hip, initial encounter: Secondary | ICD-10-CM | POA: Diagnosis not present

## 2015-09-03 DIAGNOSIS — S0990XA Unspecified injury of head, initial encounter: Secondary | ICD-10-CM | POA: Diagnosis not present

## 2015-09-03 DIAGNOSIS — M25551 Pain in right hip: Secondary | ICD-10-CM | POA: Diagnosis not present

## 2015-09-03 DIAGNOSIS — Y999 Unspecified external cause status: Secondary | ICD-10-CM | POA: Insufficient documentation

## 2015-09-03 DIAGNOSIS — Y929 Unspecified place or not applicable: Secondary | ICD-10-CM | POA: Insufficient documentation

## 2015-09-03 DIAGNOSIS — W010XXA Fall on same level from slipping, tripping and stumbling without subsequent striking against object, initial encounter: Secondary | ICD-10-CM | POA: Insufficient documentation

## 2015-09-03 MED ORDER — OXYCODONE-ACETAMINOPHEN 5-325 MG PO TABS
1.0000 | ORAL_TABLET | Freq: Once | ORAL | Status: AC
  Administered 2015-09-03: 1 via ORAL
  Filled 2015-09-03: qty 1

## 2015-09-03 MED ORDER — OXYCODONE-ACETAMINOPHEN 5-325 MG PO TABS: 1.0000 | ORAL_TABLET | Freq: Four times a day (QID) | ORAL | Status: DC | PRN

## 2015-09-03 NOTE — ED Notes (Signed)
Patient presents to the ED after a fall yesterday morning at 4 am.  Patient states, "my feet slipped out from under me, I fell back on my bottom and hit my head."  Patient denies taking blood thinners, denies passing out.  Patient states she went to work after the fall yesterday but was hurting.  Patient states her pain had increased when she woke up this morning.  Patient is complaining of pain in her lower back and her right hip radiating down her leg.

## 2015-09-03 NOTE — Progress Notes (Signed)
S: c/o r hip and back pain, fell on Sunday night, was sitting on the side of the bed and when went to stand she fell to the floor, landed on back and r hip, hit head, no loc, increased pain with walking since she fell, states pain radiates to her knee, no numbness or tingling, no v/, pain relieved by lying down  O: vitals wnl nad, pt in wheelchair, has difficulty standing and bearing weight when moved to exam table, legs appear to be equal length, pain reproduced with all rom of r hip, lower back a little tender, n/v intact  A: fall, contusion vs fx  P: sent pt to ER, called PA desk to discuss

## 2015-09-03 NOTE — ED Notes (Signed)
Pt presents after mechanical fall yesterday. States she hit her head, but no LOC. She awakened this morning to pain in her left hip, leg, and lower back.

## 2015-09-03 NOTE — ED Provider Notes (Addendum)
[facility] Emergency Department Provider Note  ____________________________________________   I have reviewed the triage vital signs and the nursing notes.   HISTORY  Chief Complaint Fall    HPI April Larsen is a 70 y.o. female who states that she got out of bed yesterday and she slipped, landing on her bottom. It happened not last night but the night before, he was able to ambulate but she continues to have pain in her right hip from the fall. She does not have an ongoing headache although she did bump her head. She feels that she has a deep bruise.There was no syncope, patient did not pass out, this was a mechanical fall.     Past Medical History  Diagnosis Date  . Cancer ([place])     skin  . Diverticulitis     There are no active problems to display for this patient.   Past Surgical History  Procedure Laterality Date  . Carpal tunnel release    . Back surgery      Current Outpatient Rx  Name  Route  Sig  Dispense  Refill  . budesonide (ENTOCORT EC) 3 MG 24 hr capsule   Oral   Take 3 mg by mouth 3 (three) times a week.         . calcium carbonate (OSCAL) 1500 (600 Ca) MG TABS tablet   Oral   Take 600 mg of elemental calcium by mouth 2 (two) times daily with a meal.         . ciprofloxacin (CIPRO) 500 MG tablet   Oral   Take 1 tablet (500 mg total) by mouth 2 (two) times daily. Patient not taking: Reported on 09/03/2015   20 tablet   0   . cyclobenzaprine (FLEXERIL) 10 MG tablet   Oral   Take 10 mg by mouth daily.      3   . DULoxetine (CYMBALTA) 60 MG capsule   Oral   Take 60 mg by mouth daily.      3   . ESTRACE VAGINAL 0.1 MG/GM vaginal cream   Vaginal   Place 1 application vaginally daily.      11     Dispense as written.   . ferrous sulfate 325 (65 FE) MG tablet   Oral   Take 325 mg by mouth daily with breakfast.         . HYDROcodone-acetaminophen (NORCO/VICODIN) 5-325 MG tablet   Oral   Take 1  tablet by mouth every 6 (six) hours as needed for moderate pain.   10 tablet   0   . lidocaine (LIDODERM) 5 %   Transdermal   Place 1 patch onto the skin daily.      3   . metroNIDAZOLE (FLAGYL) 500 MG tablet   Oral   Take 1 tablet (500 mg total) by mouth 2 (two) times daily.   20 tablet   0   . omeprazole (PRILOSEC) 20 MG capsule   Oral   Take 20 mg by mouth 2 (two) times daily.      3   . ondansetron (ZOFRAN) 4 MG tablet   Oral   Take 1 tablet (4 mg total) by mouth every 8 (eight) hours as needed for nausea or vomiting.   10 tablet   0   . triamterene-hydrochlorothiazide (MAXZIDE) 75-50 MG tablet   Oral   Take 1 tablet by mouth daily. Reported on 09/03/2015      99   .  Vitamin D, Ergocalciferol, (DRISDOL) 50000 units CAPS capsule   Oral   Take 50,000 Units by mouth 3 (three) times a week.           Allergies Sulfa antibiotics  Family History  Problem Relation Age of Onset  . Breast cancer Sister 78  . Breast cancer Paternal Aunt 70    Social History Social History  Substance Use Topics  . Smoking status: Never Smoker   . Smokeless tobacco: Never Used  . Alcohol Use: No    Review of Systems }Constitutional: No fever/chills Eyes: No visual changes. ENT: No sore throat. No stiff neck no neck pain Cardiovascular: Denies chest pain. Respiratory: Denies shortness of breath. Gastrointestinal:   no vomiting.  No diarrhea.  No constipation. Genitourinary: Negative for dysuria. Musculoskeletal: Negative lower extremity swelling Skin: Negative for rash. Neurological: Negative for headaches, focal weakness or numbness. 10-point ROS otherwise negative.  ____________________________________________   PHYSICAL EXAM:  VITAL SIGNS: ED Triage Vitals  Enc Vitals Group     BP 09/03/15 1130 132/69 mmHg     Pulse Rate 09/03/15 1130 68     Resp 09/03/15 1130 18     Temp 09/03/15 1130 97.9 F (36.6 C)     Temp Source 09/03/15 1130 Oral     SpO2 09/03/15  1130 98 %     Weight 09/03/15 1130 179 lb (81.194 kg)     Height 09/03/15 1130 5' 5"  (1.651 m)     Head Cir --      Peak Flow --      Pain Score 09/03/15 1131 7     Pain Loc --      Pain Edu? --      Excl. in [place]? --     Constitutional: Alert and oriented. Well appearing and in no acute distress. Eyes: Conjunctivae are normal. PERRL. EOMI. Head: Atraumatic. Nose: No congestion/rhinnorhea. Mouth/Throat: Mucous membranes are moist.  Oropharynx non-erythematous. Neck: No stridor.   Nontender with no meningismus Cardiovascular: Normal rate, regular rhythm. Grossly normal heart sounds.  Good peripheral circulation. Respiratory: Normal respiratory effort.  No retractions. Lungs CTAB. Abdominal: Soft and nontender. No distention. No guarding no rebound Back:  There is no focal tenderness or step off there is no midline tenderness there are no lesions noted. there is no CVA tenderness Musculoskeletal: Minimal tenderness palpation in the right hip however full painless range of motion of said, no foreshortening Refill is normal. No joint effusions, no DVT signs strong distal pulses no edema Neurologic:  Normal speech and language. No gross focal neurologic deficits are appreciated.  Skin:  Skin is warm, dry and intact. No rash noted. Psychiatric: Mood and affect are normal. Speech and behavior are normal.  ____________________________________________   LABS (all labs ordered are listed, but only abnormal results are displayed)  Labs Reviewed - No data to display ____________________________________________  EKG  I personally interpreted any EKGs ordered by me or triage  ____________________________________________  RADIOLOGY  I reviewed any imaging ordered by me or triage that were performed during my shift and, if possible, patient and/or family made aware of any abnormal findings. ____________________________________________   PROCEDURES  Procedure(s) performed:  None  Critical Care performed: None  ____________________________________________   INITIAL IMPRESSION / ASSESSMENT AND PLAN / ED COURSE  Pertinent labs & imaging results that were available during my care of the patient were reviewed by me and considered in my medical decision making (see chart for details).  Patient here for pain control after  a fall. She has been able to ambulate with minimal difficulty on that hip but it is uncomfortable. There is no evidence of fracture on x-ray. Very low suspicion clinically of fracture. We'll treat the patient's pain with pain control medications and sounded orthopedic surgery. I have advised her that if she has ongoing symptoms that seem to be worsening she may require CT scan or MRI to further evaluate but this time there is no indication of fracture. Patient very comfortable with this plan. ----------------------------------------- 1:44 PM on 09/03/2015 -----------------------------------------  Patient able and really with no difficulty in the emergency department, we will discharge her at her request. Patient is asked several times for Percocet prescription. She is not allergic to Percocet we will give her a short course of pain medication for her hip pain after her fall. Extensive return precautions and follow-up given and understood. ____________________________________________   FINAL CLINICAL IMPRESSION(S) / ED DIAGNOSES  Final diagnoses:  Head trauma      This chart was dictated using voice recognition software.  Despite best efforts to proofread,  errors can occur which can change meaning.     Schuyler Amor, MD 09/03/15 [place], MD 09/03/15 1344

## 2015-09-03 NOTE — ED Notes (Signed)
Pt able to ambulate without walker. Pt reports mild pain with ambulation but reports it is manageable. Pt in NAD. MD made aware.

## 2015-09-06 ENCOUNTER — Ambulatory Visit: Payer: Self-pay | Admitting: Physician Assistant

## 2015-09-20 DIAGNOSIS — M85671 Other cyst of bone, right ankle and foot: Secondary | ICD-10-CM | POA: Diagnosis not present

## 2015-09-20 DIAGNOSIS — G5701 Lesion of sciatic nerve, right lower limb: Secondary | ICD-10-CM | POA: Diagnosis not present

## 2015-10-22 DIAGNOSIS — Z0000 Encounter for general adult medical examination without abnormal findings: Secondary | ICD-10-CM | POA: Diagnosis not present

## 2015-10-29 DIAGNOSIS — Z0000 Encounter for general adult medical examination without abnormal findings: Secondary | ICD-10-CM | POA: Diagnosis not present

## 2015-10-29 DIAGNOSIS — K5190 Ulcerative colitis, unspecified, without complications: Secondary | ICD-10-CM | POA: Insufficient documentation

## 2015-10-29 HISTORY — DX: Ulcerative colitis, unspecified, without complications: K51.90

## 2015-12-12 DIAGNOSIS — K5792 Diverticulitis of intestine, part unspecified, without perforation or abscess without bleeding: Secondary | ICD-10-CM | POA: Diagnosis not present

## 2015-12-12 DIAGNOSIS — M79671 Pain in right foot: Secondary | ICD-10-CM | POA: Diagnosis not present

## 2015-12-17 DIAGNOSIS — K5792 Diverticulitis of intestine, part unspecified, without perforation or abscess without bleeding: Secondary | ICD-10-CM | POA: Diagnosis not present

## 2015-12-24 ENCOUNTER — Other Ambulatory Visit: Payer: Self-pay | Admitting: Internal Medicine

## 2015-12-24 DIAGNOSIS — Z1231 Encounter for screening mammogram for malignant neoplasm of breast: Secondary | ICD-10-CM

## 2016-01-06 ENCOUNTER — Ambulatory Visit
Admission: RE | Admit: 2016-01-06 | Discharge: 2016-01-06 | Disposition: A | Discharge status: Home / Self Care | Payer: 59 | Source: Ambulatory Visit | Attending: Internal Medicine | Admitting: Internal Medicine

## 2016-01-06 DIAGNOSIS — Z1231 Encounter for screening mammogram for malignant neoplasm of breast: Secondary | ICD-10-CM | POA: Diagnosis not present

## 2016-01-10 DIAGNOSIS — M79671 Pain in right foot: Secondary | ICD-10-CM | POA: Diagnosis not present

## 2016-01-10 DIAGNOSIS — G5701 Lesion of sciatic nerve, right lower limb: Secondary | ICD-10-CM | POA: Diagnosis not present

## 2016-01-10 DIAGNOSIS — G8929 Other chronic pain: Secondary | ICD-10-CM | POA: Diagnosis not present

## 2016-01-21 DIAGNOSIS — M79671 Pain in right foot: Secondary | ICD-10-CM | POA: Diagnosis not present

## 2016-01-21 DIAGNOSIS — M19071 Primary osteoarthritis, right ankle and foot: Secondary | ICD-10-CM | POA: Diagnosis not present

## 2016-03-02 DIAGNOSIS — L728 Other follicular cysts of the skin and subcutaneous tissue: Secondary | ICD-10-CM | POA: Diagnosis not present

## 2016-03-02 DIAGNOSIS — L820 Inflamed seborrheic keratosis: Secondary | ICD-10-CM | POA: Diagnosis not present

## 2016-03-02 DIAGNOSIS — L570 Actinic keratosis: Secondary | ICD-10-CM | POA: Diagnosis not present

## 2016-03-02 DIAGNOSIS — L720 Epidermal cyst: Secondary | ICD-10-CM | POA: Diagnosis not present

## 2016-03-02 DIAGNOSIS — L578 Other skin changes due to chronic exposure to nonionizing radiation: Secondary | ICD-10-CM | POA: Diagnosis not present

## 2016-03-13 DIAGNOSIS — H524 Presbyopia: Secondary | ICD-10-CM | POA: Diagnosis not present

## 2016-03-27 ENCOUNTER — Encounter: Payer: Self-pay | Admitting: Physician Assistant

## 2016-03-27 ENCOUNTER — Ambulatory Visit: Payer: Self-pay | Admitting: Physician Assistant

## 2016-03-27 VITALS — BP 149/90 | HR 103 | Temp 98.1°F

## 2016-03-27 DIAGNOSIS — J0120 Acute ethmoidal sinusitis, unspecified: Secondary | ICD-10-CM

## 2016-03-27 MED ORDER — PSEUDOEPH-BROMPHEN-DM 30-2-10 MG/5ML PO SYRP: 5.0000 mL | ORAL_SOLUTION | Freq: Four times a day (QID) | ORAL | 0 refills | Status: DC | PRN

## 2016-03-27 MED ORDER — AMOXICILLIN 875 MG PO TABS: 875.0000 mg | ORAL_TABLET | Freq: Two times a day (BID) | ORAL | 0 refills | Status: DC

## 2016-03-27 NOTE — Progress Notes (Signed)
   Subjective:    Patient ID: April Larsen, female    DOB: October 31, 1945, 70 y.o.   MRN: [id number]  HPI Patient c/o nasal congestion, facial and ear pain, post nasal drainage, sore throat, chest congestion, and cough. Denies fever/chill, or N/V/D. No palliative measures for compliant.   Review of Systems Negative except for compliant.    Objective:   Physical Exam Bilateral maxillary guarding, edematous nasal turbinates,and post nasal drainage. Neck supple, Lungs CTA, with productive cough. Heart RRR.       Assessment & Plan:sinusitis  Amoxil, Magic mouthwash, and Bromfed DM. Follow up with Family Doctor.

## 2016-03-30 DIAGNOSIS — K5792 Diverticulitis of intestine, part unspecified, without perforation or abscess without bleeding: Secondary | ICD-10-CM

## 2016-03-30 HISTORY — PX: PARTIAL COLECTOMY: SHX5273

## 2016-03-30 HISTORY — DX: Diverticulitis of intestine, part unspecified, without perforation or abscess without bleeding: K57.92

## 2016-04-22 DIAGNOSIS — Z0000 Encounter for general adult medical examination without abnormal findings: Secondary | ICD-10-CM | POA: Diagnosis not present

## 2016-04-30 DIAGNOSIS — K5190 Ulcerative colitis, unspecified, without complications: Secondary | ICD-10-CM | POA: Diagnosis not present

## 2016-04-30 DIAGNOSIS — M41127 Adolescent idiopathic scoliosis, lumbosacral region: Secondary | ICD-10-CM | POA: Diagnosis not present

## 2016-04-30 DIAGNOSIS — M5430 Sciatica, unspecified side: Secondary | ICD-10-CM | POA: Diagnosis not present

## 2016-05-15 ENCOUNTER — Other Ambulatory Visit: Payer: Self-pay | Admitting: Nurse Practitioner

## 2016-05-15 DIAGNOSIS — R1011 Right upper quadrant pain: Secondary | ICD-10-CM

## 2016-05-15 DIAGNOSIS — K210 Gastro-esophageal reflux disease with esophagitis: Secondary | ICD-10-CM | POA: Diagnosis not present

## 2016-05-15 DIAGNOSIS — R0789 Other chest pain: Secondary | ICD-10-CM | POA: Diagnosis not present

## 2016-05-15 DIAGNOSIS — K5190 Ulcerative colitis, unspecified, without complications: Secondary | ICD-10-CM | POA: Diagnosis not present

## 2016-05-26 ENCOUNTER — Ambulatory Visit
Admission: RE | Admit: 2016-05-26 | Discharge: 2016-05-26 | Disposition: A | Discharge status: Home / Self Care | Payer: 59 | Source: Ambulatory Visit | Attending: Nurse Practitioner | Admitting: Nurse Practitioner

## 2016-05-26 ENCOUNTER — Encounter
Admission: RE | Admit: 2016-05-26 | Discharge: 2016-05-26 | Disposition: A | Discharge status: Home / Self Care | Payer: 59 | Source: Ambulatory Visit | Attending: Nurse Practitioner | Admitting: Nurse Practitioner

## 2016-05-26 DIAGNOSIS — R1011 Right upper quadrant pain: Secondary | ICD-10-CM | POA: Diagnosis not present

## 2016-05-26 DIAGNOSIS — K8020 Calculus of gallbladder without cholecystitis without obstruction: Secondary | ICD-10-CM | POA: Diagnosis not present

## 2016-05-26 DIAGNOSIS — R932 Abnormal findings on diagnostic imaging of liver and biliary tract: Secondary | ICD-10-CM | POA: Diagnosis not present

## 2016-05-26 MED ORDER — TECHNETIUM TC 99M MEBROFENIN IV KIT
5.0000 | PACK | Freq: Once | INTRAVENOUS | Status: AC | PRN
  Administered 2016-05-26: 5.1 via INTRAVENOUS

## 2016-06-05 ENCOUNTER — Ambulatory Visit (INDEPENDENT_AMBULATORY_CARE_PROVIDER_SITE_OTHER): Payer: 59 | Admitting: Cardiovascular Disease

## 2016-06-05 ENCOUNTER — Other Ambulatory Visit: Payer: Self-pay

## 2016-06-05 VITALS — BP 140/80 | HR 100 | Ht 65.5 in | Wt 190.5 lb

## 2016-06-05 DIAGNOSIS — Z01810 Encounter for preprocedural cardiovascular examination: Secondary | ICD-10-CM | POA: Diagnosis not present

## 2016-06-05 DIAGNOSIS — R079 Chest pain, unspecified: Secondary | ICD-10-CM | POA: Diagnosis not present

## 2016-06-05 NOTE — Progress Notes (Signed)
Cardiology Office Note   Date:  06/05/2016   ID:  Denise, Washburn November 24, 1945, MRN [id number]  PCP:  Rusty Aus, MD  Cardiologist:   Kathlyn Sacramento, MD   Chief Complaint  Patient presents with  . other    NP. referred by Dr.Mills, gastro. for chest pain/cardiac testing for Eventual colonoscopy/EGD. Pt  c/o CP, sob. Reviewed meds with pt verbally.      History of Present Illness: Caron Ode is a 71 y.o. female who Was referred by Dr. Vira Agar for evaluation of chest pain and preoperative cardiovascular evaluation before colonoscopy/EGD. She has no prior cardiac history. She has known history of ulcerative colitis and mild hyperlipidemia. She has been experiencing intermittent episodes of lower chest and upper epigastric pain which radiates to her throat and back. She had one prolonged episode that woke her up from sleep and lasted for about 45 minutes. She felt that her throat was closing and she had bitter taste in her mouth. These episodes are typically not triggered by physical activities. She had no recent shortness of breath, sweating or palpitations. She is not a smoker. She does have family history of coronary artery disease. Her father died at the age of 73 myocardial infarction. Her mother had a stroke. Abdominal ultrasound showed gallstones without evidence of acute cholecystitis. HIDA scan was normal.   Past Medical History:  Diagnosis Date  . Cancer ([place])    skin  . Diverticulitis     Past Surgical History:  Procedure Laterality Date  . BACK SURGERY    . CARPAL TUNNEL RELEASE       Current Outpatient Prescriptions  Medication Sig Dispense Refill  . amoxicillin (AMOXIL) 875 MG tablet Take 1 tablet (875 mg total) by mouth 2 (two) times daily. 20 tablet 0  . brompheniramine-pseudoephedrine-DM 30-2-10 MG/5ML syrup Take 5 mLs by mouth 4 (four) times daily as needed. 118 mL 0  . budesonide (ENTOCORT EC) 3 MG 24 hr capsule Take 3 mg by mouth 3  (three) times a week.    . calcium carbonate (OSCAL) 1500 (600 Ca) MG TABS tablet Take 600 mg of elemental calcium by mouth 2 (two) times daily with a meal.    . ciprofloxacin (CIPRO) 500 MG tablet Take 1 tablet (500 mg total) by mouth 2 (two) times daily. 20 tablet 0  . cyclobenzaprine (FLEXERIL) 10 MG tablet Take 10 mg by mouth daily.  3  . DULoxetine (CYMBALTA) 60 MG capsule Take 60 mg by mouth daily.  3  . ESTRACE VAGINAL 0.1 MG/GM vaginal cream Place 1 application vaginally daily.  11  . ferrous sulfate 325 (65 FE) MG tablet Take 325 mg by mouth daily with breakfast.    . HYDROcodone-acetaminophen (NORCO/VICODIN) 5-325 MG tablet Take 1 tablet by mouth every 6 (six) hours as needed for moderate pain. 10 tablet 0  . lidocaine (LIDODERM) 5 % Place 1 patch onto the skin daily.  3  . metroNIDAZOLE (FLAGYL) 500 MG tablet Take 1 tablet (500 mg total) by mouth 2 (two) times daily. 20 tablet 0  . omeprazole (PRILOSEC) 20 MG capsule Take 20 mg by mouth 2 (two) times daily.  3  . ondansetron (ZOFRAN) 4 MG tablet Take 1 tablet (4 mg total) by mouth every 8 (eight) hours as needed for nausea or vomiting. 10 tablet 0  . oxyCODONE-acetaminophen (ROXICET) 5-325 MG tablet Take 1 tablet by mouth every 6 (six) hours as needed. 12 tablet 0  . triamterene-hydrochlorothiazide (MAXZIDE)  75-50 MG tablet Take 1 tablet by mouth daily. Reported on 09/03/2015  99  . Vitamin D, Ergocalciferol, (DRISDOL) 50000 units CAPS capsule Take 50,000 Units by mouth 3 (three) times a week.     No current facility-administered medications for this visit.     Allergies:   Sulfa antibiotics    Social History:  The patient  reports that she has never smoked. She has never used smokeless tobacco. She reports that she does not drink alcohol or use drugs.   Family History:  The patient's family history includes Breast cancer (age of onset: 90) in her paternal aunt; Breast cancer (age of onset: 49) in her sister.    ROS:  Please see  the history of present illness.   Otherwise, review of systems are positive for none.   All other systems are reviewed and negative.    PHYSICAL EXAM: VS:  BP 136/78   Pulse 100   Ht 5' 5.5" (1.664 m)   Wt 190 lb 8 oz (86.4 kg)   BMI 31.22 kg/m  , BMI Body mass index is 31.22 kg/m. GEN: Well nourished, well developed, in no acute distress  HEENT: normal  Neck: no JVD, carotid bruits, or masses Cardiac: RRR; no murmurs, rubs, or gallops,no edema  Respiratory:  clear to auscultation bilaterally, normal work of breathing GI: soft, nontender, nondistended, + BS MS: no deformity or atrophy  Skin: warm and dry, no rash Neuro:  Strength and sensation are intact Psych: euthymic mood, full affect   EKG:  EKG is ordered today. The ekg ordered today demonstrates normal sinus rhythm with occasional PVCs. No significant ST or T wave changes.   Recent Labs: 08/24/2015: ALT 19; BUN 21; Creatinine, Ser 1.08; Hemoglobin 13.7; Platelets 314; Potassium 3.4; Sodium 136    Lipid Panel No results found for: CHOL, TRIG, HDL, CHOLHDL, VLDL, LDLCALC, LDLDIRECT    Wt Readings from Last 3 Encounters:  06/05/16 190 lb 8 oz (86.4 kg)  09/03/15 179 lb (81.2 kg)  08/31/15 185 lb (83.9 kg)      PAD Screen 06/05/2016  Previous PAD dx? No  Previous surgical procedure? No  Pain with walking? Yes  Subsides with rest? Yes  Feet/toe relief with dangling? Yes  Painful, non-healing ulcers? No  Extremities discolored? Yes      ASSESSMENT AND PLAN:  1.  Atypical chest pain: Given age and risk factors, I recommend evaluation with a treadmill nuclear stress test. Physical exam is unremarkable. EKG is also unremarkable other than PVCs. It is highly possible that some of her symptoms are GI in nature with an element of GERD and possible esophageal spasm. The patient is going to continue with further GI workup.  2. Preoperative cardiovascular evaluation for endoscopy: Given the presence of chest pain, she  would be evaluated with a stress test. If stress test is low risk, she can proceed at an overall low risk from a cardiac standpoint.    Disposition:   FU with me as needed.   Signed,  Kathlyn Sacramento, MD  06/05/2016 2:37 PM    [place]

## 2016-06-05 NOTE — Patient Instructions (Addendum)
Medication Instructions:  Your physician recommends that you continue on your current medications as directed. Please refer to the Current Medication list given to you today.   Labwork: none  Testing/Procedures: Your physician has requested that you have a lexiscan myoview. For further information please visit [email or web address]. Please follow instruction sheet, as given.  April Larsen  Your caregiver has ordered a Stress Test with nuclear imaging. The purpose of this test is to evaluate the blood supply to your heart muscle. This procedure is referred to as a "Non-Invasive Stress Test." This is because other than having an IV started in your vein, nothing is inserted or "invades" your body. Cardiac stress tests are done to find areas of poor blood flow to the heart by determining the extent of coronary artery disease (CAD). Some patients exercise on a treadmill, which naturally increases the blood flow to your heart, while others who are  unable to walk on a treadmill due to physical limitations have a pharmacologic/chemical stress agent called Lexiscan . This medicine will mimic walking on a treadmill by temporarily increasing your coronary blood flow.   Please note: these test may take anywhere between 2-4 hours to complete  PLEASE REPORT TO [facility] AT THE FIRST DESK WILL DIRECT YOU WHERE TO GO  Date of Procedure:__Friday, March 16___  Arrival Time for Procedure:___7:45am__  Instructions regarding medication:   __xx__:  Hold maxide the morning of your test.   PLEASE NOTIFY THE OFFICE AT LEAST 24 HOURS IN ADVANCE IF YOU ARE UNABLE TO KEEP YOUR APPOINTMENT.  [phone] AND  PLEASE NOTIFY NUCLEAR MEDICINE AT [facility] AT LEAST 24 HOURS IN ADVANCE IF YOU ARE UNABLE TO KEEP YOUR APPOINTMENT. [phone]  How to prepare for your Myoview test:  1. Do not eat or drink after midnight 2. No caffeine for 24 hours prior to test 3. No smoking 24 hours prior  to test. 4. Your medication may be taken with water.  If your doctor stopped a medication because of this test, do not take that medication. 5. Ladies, please do not wear dresses.  Skirts or pants are appropriate. Please wear a short sleeve shirt. 6. No perfume, cologne or lotion. 7. Wear comfortable walking shoes. No heels!            Follow-Up: Your physician recommends that you schedule a follow-up appointment as needed.    Any Other Special Instructions Will Be Listed Below (If Applicable).     If you need a refill on your cardiac medications before your next appointment, please call your pharmacy.  Cardiac Nuclear Scan A cardiac nuclear scan is a test that measures blood flow to the heart when a person is resting and when he or she is exercising. The test looks for problems such as:  Not enough blood reaching a portion of the heart.  The heart muscle not working normally. You may need this test if:  You have heart disease.  You have had abnormal lab results.  You have had heart surgery or angioplasty.  You have chest pain.  You have shortness of breath. In this test, a radioactive dye (tracer) is injected into your bloodstream. After the tracer has traveled to your heart, an imaging device is used to measure how much of the tracer is absorbed by or distributed to various areas of your heart. This procedure is usually done at a hospital and takes 2-4 hours. Tell a health care provider about:  Any allergies you have.  All medicines you are taking, including vitamins, herbs, eye drops, creams, and over-the-counter medicines.  Any problems you or family members have had with the use of anesthetic medicines.  Any blood disorders you have.  Any surgeries you have had.  Any medical conditions you have.  Whether you are pregnant or may be pregnant. What are the risks? Generally, this is a safe procedure. However, problems may occur, including:  Serious chest  pain and heart attack. This is only a risk if the stress portion of the test is done.  Rapid heartbeat.  Sensation of warmth in your chest. This usually passes quickly. What happens before the procedure?  Ask your health care provider about changing or stopping your regular medicines. This is especially important if you are taking diabetes medicines or blood thinners.  Remove your jewelry on the day of the procedure. What happens during the procedure?  An IV tube will be inserted into one of your veins.  Your health care provider will inject a small amount of radioactive tracer through the tube.  You will wait for 20-40 minutes while the tracer travels through your bloodstream.  Your heart activity will be monitored with an electrocardiogram (ECG).  You will lie down on an exam table.  Images of your heart will be taken for about 15-20 minutes.  You may be asked to exercise on a treadmill or stationary bike. While you exercise, your heart's activity will be monitored with an ECG, and your blood pressure will be checked. If you are unable to exercise, you may be given a medicine to increase blood flow to parts of your heart.  When blood flow to your heart has peaked, a tracer will again be injected through the IV tube.  After 20-40 minutes, you will get back on the exam table and have more images taken of your heart.  When the procedure is over, your IV tube will be removed. The procedure may vary among health care providers and hospitals. Depending on the type of tracer used, scans may need to be repeated 3-4 hours later. What happens after the procedure?  Unless your health care provider tells you otherwise, you may return to your normal schedule, including diet, activities, and medicines.  Unless your health care provider tells you otherwise, you may increase your fluid intake. This will help flush the contrast dye from your body. Drink enough fluid to keep your urine clear or  pale yellow.  It is up to you to get your test results. Ask your health care provider, or the department that is doing the test, when your results will be ready. Summary  A cardiac nuclear scan measures the blood flow to the heart when a person is resting and when he or she is exercising.  You may need this test if you are at risk for heart disease.  Tell your health care provider if you are pregnant.  Unless your health care provider tells you otherwise, increase your fluid intake. This will help flush the contrast dye from your body. Drink enough fluid to keep your urine clear or pale yellow. This information is not intended to replace advice given to you by your health care provider. Make sure you discuss any questions you have with your health care provider. Document Released: 04/10/2004 Document Revised: 03/18/2016 Document Reviewed: 02/22/2013 Elsevier Interactive Patient Education  2017 [employer].

## 2016-06-12 ENCOUNTER — Encounter
Admission: RE | Admit: 2016-06-12 | Discharge: 2016-06-12 | Disposition: A | Discharge status: Home / Self Care | Payer: 59 | Source: Ambulatory Visit | Attending: Cardiovascular Disease | Admitting: Cardiovascular Disease

## 2016-06-12 ENCOUNTER — Other Ambulatory Visit: Payer: Self-pay

## 2016-06-12 DIAGNOSIS — R079 Chest pain, unspecified: Secondary | ICD-10-CM | POA: Insufficient documentation

## 2016-06-12 LAB — NM MYOCAR MULTI W/SPECT W/WALL MOTION / EF
CHL CUP NUCLEAR SSS: 1
CHL CUP STRESS STAGE 2 GRADE: 0 %
CHL CUP STRESS STAGE 3 SPEED: 0.2 mph
CHL CUP STRESS STAGE 4 GRADE: 10 %
CHL CUP STRESS STAGE 4 SPEED: 1.7 mph
CHL CUP STRESS STAGE 5 GRADE: 0 %
CHL CUP STRESS STAGE 5 HR: 118 {beats}/min
CHL CUP STRESS STAGE 5 SPEED: 0 mph
CHL CUP STRESS STAGE 6 DBP: 70 mmHg
CHL CUP STRESS STAGE 6 SPEED: 0 mph
CSEPEW: 4.6 METS
CSEPPHR: 134 {beats}/min
CSEPPMHR: 88 %
Exercise duration (min): 4 min
Exercise duration (sec): 1 s
LV dias vol: 55 mL (ref 46–106)
LVSYSVOL: 15 mL
MPHR: 152 {beats}/min
Peak BP: 127 mmHg
Percent HR: 88 %
Rest HR: 82 {beats}/min
SDS: 1
SRS: 4
Stage 1 HR: 94 {beats}/min
Stage 2 HR: 90 {beats}/min
Stage 2 Speed: 0.1 mph
Stage 3 Grade: 0.2 %
Stage 3 HR: 90 {beats}/min
Stage 4 DBP: 79 mmHg
Stage 4 HR: 134 {beats}/min
Stage 4 SBP: 127 mmHg
Stage 6 Grade: 0 %
Stage 6 HR: 96 {beats}/min
Stage 6 SBP: 157 mmHg
TID: 1

## 2016-06-12 MED ORDER — TECHNETIUM TC 99M TETROFOSMIN IV KIT
12.7000 | PACK | Freq: Once | INTRAVENOUS | Status: AC | PRN
  Administered 2016-06-12: 12.7 via INTRAVENOUS

## 2016-06-12 MED ORDER — TECHNETIUM TC 99M TETROFOSMIN IV KIT
30.0000 | PACK | Freq: Once | INTRAVENOUS | Status: AC | PRN
  Administered 2016-06-12: 30.42 via INTRAVENOUS

## 2016-07-02 ENCOUNTER — Telehealth: Payer: Self-pay | Admitting: Cardiovascular Disease

## 2016-07-02 NOTE — Telephone Encounter (Signed)
Routed nuc med results clearing pt for EGD to [facility], [phone]

## 2016-07-08 DIAGNOSIS — K8010 Calculus of gallbladder with chronic cholecystitis without obstruction: Secondary | ICD-10-CM | POA: Diagnosis not present

## 2016-07-10 DIAGNOSIS — M5430 Sciatica, unspecified side: Secondary | ICD-10-CM | POA: Diagnosis not present

## 2016-07-28 ENCOUNTER — Encounter: Payer: Self-pay | Admitting: *Deleted

## 2016-07-29 ENCOUNTER — Ambulatory Visit
Admission: RE | Admit: 2016-07-29 | Discharge: 2016-07-29 | Disposition: A | Discharge status: Home / Self Care | Payer: 59 | Source: Ambulatory Visit | Attending: Unknown Physician Specialty | Admitting: Unknown Physician Specialty

## 2016-07-29 ENCOUNTER — Ambulatory Visit: Payer: 59 | Admitting: *Deleted

## 2016-07-29 ENCOUNTER — Encounter: Payer: Self-pay | Admitting: *Deleted

## 2016-07-29 ENCOUNTER — Encounter
Admission: RE | Disposition: A | Discharge status: Home / Self Care | Payer: Self-pay | Source: Ambulatory Visit | Attending: Unknown Physician Specialty

## 2016-07-29 DIAGNOSIS — Z79890 Hormone replacement therapy (postmenopausal): Secondary | ICD-10-CM | POA: Diagnosis not present

## 2016-07-29 DIAGNOSIS — R1011 Right upper quadrant pain: Secondary | ICD-10-CM | POA: Diagnosis not present

## 2016-07-29 DIAGNOSIS — K5732 Diverticulitis of large intestine without perforation or abscess without bleeding: Secondary | ICD-10-CM | POA: Insufficient documentation

## 2016-07-29 DIAGNOSIS — K219 Gastro-esophageal reflux disease without esophagitis: Secondary | ICD-10-CM | POA: Insufficient documentation

## 2016-07-29 DIAGNOSIS — K5730 Diverticulosis of large intestine without perforation or abscess without bleeding: Secondary | ICD-10-CM | POA: Diagnosis not present

## 2016-07-29 DIAGNOSIS — K640 First degree hemorrhoids: Secondary | ICD-10-CM | POA: Diagnosis not present

## 2016-07-29 DIAGNOSIS — K5100 Ulcerative (chronic) pancolitis without complications: Secondary | ICD-10-CM | POA: Diagnosis not present

## 2016-07-29 DIAGNOSIS — K5792 Diverticulitis of intestine, part unspecified, without perforation or abscess without bleeding: Secondary | ICD-10-CM | POA: Diagnosis not present

## 2016-07-29 DIAGNOSIS — K5190 Ulcerative colitis, unspecified, without complications: Secondary | ICD-10-CM | POA: Diagnosis not present

## 2016-07-29 DIAGNOSIS — K449 Diaphragmatic hernia without obstruction or gangrene: Secondary | ICD-10-CM | POA: Diagnosis not present

## 2016-07-29 DIAGNOSIS — Z79899 Other long term (current) drug therapy: Secondary | ICD-10-CM | POA: Insufficient documentation

## 2016-07-29 HISTORY — DX: Headache: R51

## 2016-07-29 HISTORY — DX: Scoliosis, unspecified: M41.9

## 2016-07-29 HISTORY — DX: Other postherpetic nervous system involvement: B02.29

## 2016-07-29 HISTORY — DX: Ulcerative colitis, unspecified, without complications: K51.90

## 2016-07-29 HISTORY — PX: COLONOSCOPY WITH PROPOFOL: SHX5780

## 2016-07-29 HISTORY — DX: Headache, unspecified: R51.9

## 2016-07-29 HISTORY — PX: ESOPHAGOGASTRODUODENOSCOPY (EGD) WITH PROPOFOL: SHX5813

## 2016-07-29 SURGERY — ESOPHAGOGASTRODUODENOSCOPY (EGD) WITH PROPOFOL
Anesthesia: General

## 2016-07-29 MED ORDER — PROPOFOL 500 MG/50ML IV EMUL
INTRAVENOUS | Status: AC
  Filled 2016-07-29: qty 50

## 2016-07-29 MED ORDER — SODIUM CHLORIDE 0.9 % IV SOLN
INTRAVENOUS | Status: DC
  Administered 2016-07-29: 1000 mL via INTRAVENOUS
  Administered 2016-07-29: 14:00:00 via INTRAVENOUS

## 2016-07-29 MED ORDER — PROPOFOL 500 MG/50ML IV EMUL
INTRAVENOUS | Status: DC | PRN
  Administered 2016-07-29: 150 ug/kg/min via INTRAVENOUS

## 2016-07-29 MED ORDER — SODIUM CHLORIDE 0.9 % IV SOLN: INTRAVENOUS | Status: DC

## 2016-07-29 NOTE — Transfer of Care (Signed)
Immediate Anesthesia Transfer of Care Note  Patient: April Larsen  Procedure(s) Performed: Procedure(s): ESOPHAGOGASTRODUODENOSCOPY (EGD) WITH PROPOFOL (N/A) COLONOSCOPY WITH PROPOFOL (N/A)  Patient Location: PACU  Anesthesia Type:General  Level of Consciousness: awake, alert  and oriented  Airway & Oxygen Therapy: Patient Spontanous Breathing and Patient connected to nasal cannula oxygen  Post-op Assessment: Report given to RN and Post -op Vital signs reviewed and stable  Post vital signs: Reviewed and stable  Last Vitals:  Vitals:   07/29/16 1311 07/29/16 1439  BP: (!) 141/89 108/68  Pulse: 96 68  Resp: 18 17  Temp: 37.7 C (!) 36.1 C    Last Pain:  Vitals:   07/29/16 1439  TempSrc: Tympanic         Complications: No apparent anesthesia complications

## 2016-07-29 NOTE — Op Note (Signed)
[facility] Gastroenterology Patient Name: [name] Procedure Date: 07/29/2016 1:50 PM MRN: [id number] Account #: [id number] Date of Birth: 1946/03/28 Admit Type: Outpatient Age: 71 Room: [facility] ENDO ROOM 4 Gender: Female Note Status: Finalized Procedure:            Upper GI endoscopy Indications:          Epigastric abdominal pain, Abdominal pain in the right                        upper quadrant, Heartburn Providers:            Manya Silvas, MD Medicines:            Propofol per Anesthesia Complications:        No immediate complications. Procedure:            Pre-Anesthesia Assessment:                       - After reviewing the risks and benefits, the patient                        was deemed in satisfactory condition to undergo the                        procedure.                       After obtaining informed consent, the endoscope was                        passed under direct vision. Throughout the procedure,                        the patient's blood pressure, pulse, and oxygen                        saturations were monitored continuously. The                        Colonoscope was introduced through the mouth, and                        advanced to the second part of duodenum. The upper GI                        endoscopy was somewhat difficult due to unusual                        anatomy. Successful completion of the procedure was                        aided by applying abdominal pressure. The patient                        tolerated the procedure well. Findings:      The examined esophagus was normal. Gastro esophageal junction at 30cm.      A large hiatal hernia was present. 10cm or so. This required external       abd pressure to pass from the body into the antrum. No ulceration or       gastritis.  The examined duodenum was normal. Impression:           - Normal esophagus.                       - Large hiatal hernia.         - Normal examined duodenum.                       - No specimens collected. Recommendation:       - The findings and recommendations were discussed with                        the patient's family. Manya Silvas, MD 07/29/2016 2:37:25 PM This report has been signed electronically. Number of Addenda: 0 Note Initiated On: 07/29/2016 1:50 PM      [facility]

## 2016-07-29 NOTE — Anesthesia Preprocedure Evaluation (Addendum)
Anesthesia Evaluation  Patient identified by MRN, date of birth, ID band Patient awake    Reviewed: Allergy & Precautions, NPO status , Patient's Chart, lab work & pertinent test results  Airway Mallampati: III  TM Distance: <3 FB     Dental   Pulmonary neg pulmonary ROS,    Pulmonary exam normal        Cardiovascular negative cardio ROS Normal cardiovascular exam     Neuro/Psych  Headaches,  Neuromuscular disease negative psych ROS   GI/Hepatic Neg liver ROS, PUD,   Endo/Other  negative endocrine ROS  Renal/GU negative Renal ROS  negative genitourinary   Musculoskeletal scoliosis   Abdominal Normal abdominal exam  (+)   Peds negative pediatric ROS (+)  Hematology negative hematology ROS (+)   Anesthesia Other Findings Past Medical History: No date: Cancer ([place])     Comment: skin No date: Diverticulitis No date: Headache     Comment: MIGRAINE No date: Postherpetic neuralgia     Comment: RIGHT 7TH.CRANIAL NERVE No date: Scoliosis No date: Ulcerative colitis ([place])  Reproductive/Obstetrics                            Anesthesia Physical Anesthesia Plan  ASA: II  Anesthesia Plan: General   Post-op Pain Management:    Induction: Intravenous  Airway Management Planned: Nasal Cannula  Additional Equipment:   Intra-op Plan:   Post-operative Plan:   Informed Consent: I have reviewed the patients History and Physical, chart, labs and discussed the procedure including the risks, benefits and alternatives for the proposed anesthesia with the patient or authorized representative who has indicated his/her understanding and acceptance.     Plan Discussed with: CRNA and Surgeon  Anesthesia Plan Comments:         Anesthesia Quick Evaluation

## 2016-07-29 NOTE — Anesthesia Post-op Follow-up Note (Cosign Needed)
Anesthesia QCDR form completed.        

## 2016-07-29 NOTE — H&P (Signed)
Primary Care Physician:  Rusty Aus, MD Primary Gastroenterologist:  Dr. Vira Agar  Pre-Procedure History & Physical: HPI:  April Larsen is a 71 y.o. female is here for an endoscopy and colonoscopy.   Past Medical History:  Diagnosis Date  . Cancer ([place])    skin  . Diverticulitis   . Headache    MIGRAINE  . Postherpetic neuralgia    RIGHT 7TH.CRANIAL NERVE  . Scoliosis   . Ulcerative colitis [facility])     Past Surgical History:  Procedure Laterality Date  . ABDOMINAL HYSTERECTOMY    . BACK SURGERY     L3-L4 AND L3-S1 DECOMPRESSION  . CARPAL TUNNEL RELEASE    . COLONOSCOPY  11/06/2005  . ESOPHAGOGASTRODUODENOSCOPY  11/06/2005    Prior to Admission medications   Medication Sig Start Date End Date Taking? Authorizing Provider  acetaminophen (TYLENOL) 500 MG tablet Take 1,000 mg by mouth every 6 (six) hours as needed.   Yes Historical Provider, MD  DULoxetine (CYMBALTA) 60 MG capsule Take 60 mg by mouth daily. 06/17/15  Yes Historical Provider, MD  omeprazole (PRILOSEC) 20 MG capsule Take 20 mg by mouth 2 (two) times daily. 07/29/15  Yes Historical Provider, MD  ondansetron (ZOFRAN) 4 MG tablet Take 4 mg by mouth every 8 (eight) hours as needed for nausea or vomiting.   Yes Historical Provider, MD  budesonide (ENTOCORT EC) 3 MG 24 hr capsule Take 3 mg by mouth 3 (three) times a week.    Historical Provider, MD  calcium carbonate (OSCAL) 1500 (600 Ca) MG TABS tablet Take 600 mg of elemental calcium by mouth 2 (two) times daily with a meal.    Historical Provider, MD  Cholecalciferol (VITAMIN D3) 2000 units capsule Take 200 Units by mouth daily.    Historical Provider, MD  cyclobenzaprine (FLEXERIL) 10 MG tablet Take 10 mg by mouth daily. 08/12/15   Historical Provider, MD  ESTRACE VAGINAL 0.1 MG/GM vaginal cream Place 1 application vaginally daily. 06/20/15   Historical Provider, MD  ferrous sulfate 325 (65 FE) MG tablet Take 325 mg by mouth daily with breakfast.    Historical  Provider, MD  lidocaine (LIDODERM) 5 % Place 1 patch onto the skin daily. 05/28/15   Historical Provider, MD  meloxicam (MOBIC) 7.5 MG tablet Take 7.5 mg by mouth as needed for pain.    Historical Provider, MD  triamterene-hydrochlorothiazide (MAXZIDE) 75-50 MG tablet Take 1 tablet by mouth daily. Reported on 09/03/2015 07/29/15   Historical Provider, MD    Allergies as of 06/08/2016 - Review Complete 06/05/2016  Allergen Reaction Noted  . Sulfa antibiotics Rash 08/24/2015    Family History  Problem Relation Age of Onset  . Breast cancer Sister 50  . Breast cancer Paternal Aunt 38    Social History   Social History  . Marital status: Divorced    Spouse name: N/A  . Number of children: N/A  . Years of education: N/A   Occupational History  . Not on file.   Social History Main Topics  . Smoking status: Never Smoker  . Smokeless tobacco: Never Used  . Alcohol use No  . Drug use: No  . Sexual activity: Not on file   Other Topics Concern  . Not on file   Social History Narrative  . No narrative on file    Review of Systems: See HPI, otherwise negative ROS  Physical Exam: BP (!) 141/89   Pulse 96   Temp 99.9 F (37.7 C) (Tympanic)  Resp 18   Ht 5' 5.5" (1.664 m)   Wt 83.9 kg (185 lb)   SpO2 100%   BMI 30.32 kg/m  General:   Alert,  pleasant and cooperative in NAD Head:  Normocephalic and atraumatic. Neck:  Supple; no masses or thyromegaly. Lungs:  Clear throughout to auscultation.    Heart:  Regular rate and rhythm. Abdomen:  Soft, nontender and nondistended. Normal bowel sounds, without guarding, and without rebound.   Neurologic:  Alert and  oriented x4;  grossly normal neurologically.  Impression/Plan: April Larsen is here for an endoscopy and colonoscopy to be performed for chronic ulcerative colitis and EGD for RUQ abd pain, reflux with discomfort.  Risks, benefits, limitations, and alternatives regarding  endoscopy and colonoscopy have been  reviewed with the patient.  Questions have been answered.  All parties agreeable.   Gaylyn Cheers, MD  07/29/2016, 1:49 PM

## 2016-07-29 NOTE — Op Note (Signed)
[facility] Gastroenterology Patient Name: April Larsen Procedure Date: 07/29/2016 1:51 PM MRN: [id number] Account #: [id number] Date of Birth: 1945-07-12 Admit Type: Outpatient Age: 71 Room: [facility] ENDO ROOM 4 Gender: Female Note Status: Finalized Procedure:            Colonoscopy Indications:          Follow-up of chronic ulcerative pancolitis Providers:            Manya Silvas, MD Referring MD:         Rusty Aus, MD (Referring MD) Medicines:            Propofol per Anesthesia Complications:        No immediate complications. Procedure:            Pre-Anesthesia Assessment:                       - After reviewing the risks and benefits, the patient                        was deemed in satisfactory condition to undergo the                        procedure.                       After obtaining informed consent, the colonoscope was                        passed under direct vision. Throughout the procedure,                        the patient's blood pressure, pulse, and oxygen                        saturations were monitored continuously. The                        Colonoscope was introduced through the anus and                        advanced to the the cecum, identified by appendiceal                        orifice and ileocecal valve. The colonoscopy was                        somewhat difficult due to restricted mobility of the                        colon. Successful completion of the procedure was aided                        by applying abdominal pressure. The patient tolerated                        the procedure well. The quality of the bowel                        preparation was good. Findings:      Multiple medium-mouthed diverticula were found in the sigmoid colon and  descending colon. One had clear diverticulitis and will require       antibiotics.      Internal hemorrhoids were found during endoscopy. The hemorrhoids were   medium-sized and Grade I (internal hemorrhoids that do not prolapse).      Normal mucosa was found in the sigmoid colon, in the descending colon,       in the transverse colon and in the ascending colon. Biopsies were taken       with a cold forceps for histology. Done from ascending, transverse,       descending and sigmoid colon. Impression:           - Diverticulosis in the sigmoid colon and in the                        descending colon.                       - Internal hemorrhoids.                       - Normal mucosa in the sigmoid colon, in the descending                        colon, in the transverse colon and in the ascending                        colon. Biopsied. Recommendation:       - Await pathology results. Take antibiotics. Manya Silvas, MD 07/29/2016 2:25:25 PM This report has been signed electronically. Number of Addenda: 0 Note Initiated On: 07/29/2016 1:51 PM Scope Withdrawal Time: 0 hours 8 minutes 29 seconds  Total Procedure Duration: 0 hours 20 minutes 11 seconds       [facility]

## 2016-07-30 ENCOUNTER — Encounter: Payer: Self-pay | Admitting: Unknown Physician Specialty

## 2016-07-30 NOTE — Anesthesia Postprocedure Evaluation (Signed)
Anesthesia Post Note  Patient: April Larsen  Procedure(s) Performed: Procedure(s) (LRB): ESOPHAGOGASTRODUODENOSCOPY (EGD) WITH PROPOFOL (N/A) COLONOSCOPY WITH PROPOFOL (N/A)  Patient location during evaluation: Endoscopy Anesthesia Type: General Level of consciousness: awake and alert Pain management: pain level controlled Vital Signs Assessment: post-procedure vital signs reviewed and stable Respiratory status: spontaneous breathing, nonlabored ventilation, respiratory function stable and patient connected to nasal cannula oxygen Cardiovascular status: blood pressure returned to baseline and stable Postop Assessment: no signs of nausea or vomiting Anesthetic complications: no     Last Vitals:  Vitals:   07/29/16 1459 07/29/16 1509  BP: 128/78 134/89  Pulse: 69 68  Resp: 15 14  Temp:      Last Pain:  Vitals:   07/29/16 1439  TempSrc: Tympanic                 [name]

## 2016-07-31 LAB — SURGICAL PATHOLOGY

## 2016-08-06 DIAGNOSIS — K5792 Diverticulitis of intestine, part unspecified, without perforation or abscess without bleeding: Secondary | ICD-10-CM | POA: Diagnosis not present

## 2016-08-06 DIAGNOSIS — K5190 Ulcerative colitis, unspecified, without complications: Secondary | ICD-10-CM | POA: Diagnosis not present

## 2016-08-06 DIAGNOSIS — K449 Diaphragmatic hernia without obstruction or gangrene: Secondary | ICD-10-CM | POA: Diagnosis not present

## 2016-08-06 DIAGNOSIS — K219 Gastro-esophageal reflux disease without esophagitis: Secondary | ICD-10-CM

## 2016-08-06 HISTORY — DX: Gastro-esophageal reflux disease without esophagitis: K21.9

## 2016-08-13 ENCOUNTER — Encounter
Admission: RE | Admit: 2016-08-13 | Discharge: 2016-08-13 | Disposition: A | Discharge status: Home / Self Care | Payer: 59 | Source: Ambulatory Visit | Attending: Surgery | Admitting: Surgery

## 2016-08-13 HISTORY — DX: Anemia, unspecified: D64.9

## 2016-08-13 HISTORY — DX: Personal history of other diseases of the digestive system: Z87.19

## 2016-08-13 HISTORY — DX: Family history of other specified conditions: Z84.89

## 2016-08-13 HISTORY — DX: Gastro-esophageal reflux disease without esophagitis: K21.9

## 2016-08-13 HISTORY — DX: Unspecified osteoarthritis, unspecified site: M19.90

## 2016-08-13 NOTE — Patient Instructions (Signed)
  Your procedure is scheduled on: 08-21-16 Report to Same Day Surgery 2nd floor medical mall [facility] Entrance-take elevator on left to 2nd floor.  Check in with surgery information desk.) To find out your arrival time please call [phone] between 1PM - 3PM on 08-20-16  Remember: Instructions that are not followed completely may result in serious medical risk, up to and including death, or upon the discretion of your surgeon and anesthesiologist your surgery may need to be rescheduled.    _x___ 1. Do not eat food or drink liquids after midnight. No gum chewing or hard candies.     __x__ 2. No Alcohol for 24 hours before or after surgery.   __x__3. No Smoking for 24 prior to surgery.   ____  4. Bring all medications with you on the day of surgery if instructed.    __x__ 5. Notify your doctor if there is any change in your medical condition     (cold, fever, infections).     Do not wear jewelry, make-up, hairpins, clips or nail polish.  Do not wear lotions, powders, or perfumes. You may wear deodorant.  Do not shave 48 hours prior to surgery. Men may shave face and neck.  Do not bring valuables to the hospital.    [facility] is not responsible for any belongings or valuables.               Contacts, dentures or bridgework may not be worn into surgery.  Leave your suitcase in the car. After surgery it may be brought to your room.  For patients admitted to the hospital, discharge time is determined by your treatment team.   Patients discharged the day of surgery will not be allowed to drive home.  You will need someone to drive you home and stay with you the night of your procedure.    Please read over the following fact sheets that you were given:    _x___ [facility] WITH A SMALL SIP OF WATER .These include:  1. PRILOSEC   2.  3.  4.  5.  6.  ____Fleets enema or Magnesium Citrate as directed.   ____ Use CHG Soap or sage wipes  as directed on instruction sheet   ____ Use inhalers on the day of surgery and bring to hospital day of surgery  ____ Stop Metformin and Janumet 2 days prior to surgery.    ____ Take 1/2 of usual insulin dose the night before surgery and none on the morning surgery.   ____ Follow recommendations from Cardiologist, Pulmonologist or PCP regarding stopping Aspirin, Coumadin, Pllavix ,Eliquis, Effient, or Pradaxa, and Pletal.  X____Stop Anti-inflammatories such as Advil, Aleve, Ibuprofen, Motrin, Naproxen, Naprosyn, Goodies powders or aspirin products NOW-OK to take Tylenol    ____ Stop supplements until after surgery.     ____ Bring C-Pap to the hospital.

## 2016-08-20 ENCOUNTER — Encounter: Payer: Self-pay | Admitting: *Deleted

## 2016-08-20 NOTE — Pre-Procedure Instructions (Signed)
NM Myocar Multi W/Spect W/Wall Motion / EF (Accession [id number]) (Order [id number])  Imaging  Date: 06/12/2016 Department: [facility] NUCLEAR MEDICINE Released By: Sharene Skeans Authorizing: Wellington Hampshire, MD  Exam Information   Status Exam Begun  Exam Ended   Final [99] 06/12/2016 8:13 AM 06/12/2016 10:38 AM  PACS Images   Show images for NM Myocar Multi W/Spect W/Wall Motion / EF  Study Result   Exercise myocardial perfusion imaging study with no significant  ischemia Normal wall motion, EF estimated at 51% No EKG changes concerning for ischemia at peak stress or in recovery. Target heart rate achieved, exercise time 4 min, 4.6 METS Low risk scan   Signed, Esmond Plants, MD, Ph.D [facility]     Scans on Order [id number]   Scan on 06/12/2016 2:24 PM by Default, Provider, MD  Result History   NM Myocar Multi W/Spect W/Wall Motion / EF (Order #[id number]) on 06/12/2016 - Order Result History Report  Result Notes   Notes recorded by Georgiana Shore, RN on 06/15/2016 at 1:36 PM EDT Reviewed results w/pt who verbalized understanding.   ------  Notes recorded by Georgiana Shore, RN on 06/15/2016 at 9:47 AM EDT s/w gentleman at home number who states she is at work. Left message on cell VM for patient to contact the office.   ------  Notes recorded by Wellington Hampshire, MD on 06/14/2016 at 6:16 PM EDT Inform patient that stress test was normal. She is at low risk for endoscopy.       Vitals   Height Weight BMI (Calculated)  5' 5.5" (1.664 m) 190 lb 8 oz (86.4 kg) 31.3  Protocol Documents   Imaging Protocol  Nuclear Stress Findings   Isotope administration Rest isotope was administered with an IV injection of 12.704 mCi technetium tetrofosmin. Rest SPECT images were obtained approximately 45 minutes post tracer injection. Stress isotope was administered with an IV injection of 30.429 mCi technetium tetrofosmin Stress SPECT images were obtained  approximately 30 minutes post tracer injection.    Nuclear Measurements Study was gated.    Nuclear Stress Measurements   LV sys vol 15 mL    TID 1     LV dias vol 55 mL    SSS 1     SRS 4     SDS 1          Stress Measurements   Baseline Vitals  Rest HR 82 bpm    Rest BP 122/83 mmHg    Exercise Time  Exercise duration (min) 4 min    Exercise duration (sec) 1 sec    Peak Stress Vitals  Peak HR 134 BPM    Peak BP 127 mmHg    Exercise Data  MPHR 152 bpm    Percent HR 88 %    Estimated workload 4.6 METS       Stress Findings   ECG RSR, 82, no acute st/t changes. .    Stress Findings The patient exercised following the Bruce protocol.  The patient reported shortness of breath during the stress test. The patient experienced no angina during the stress test.   The test was stopped because the patient complained of fatigue and shortness of breath. Test was stopped per protocol.   Blood pressure and heart rate demonstrated a normal response to exercise. Blood pressure demonstrated a normal response to exercise. Overall, the patient's exercise capacity was moderately impaired.   Recovery time: 5 minutes. The patient's response to  exercise was adequate for diagnosis.    Response to Stress There was no ST segment deviation noted during stress.  Arrhythmias during stress: none.  Arrhythmias during recovery: none.  There were no significant arrhythmias noted during the test.  ECG was interpretable and there was no significant change from baseline.    External Result Report   External Result Report  Imaging   Imaging Information  Signed by   Signed Date/Time  Phone Pager  Minna Merritts 06/12/2016 7:33 PM [phone]   Result Notes   Notes recorded by Georgiana Shore, RN on 06/15/2016 at 1:36 PM EDT Reviewed results w/pt who verbalized understanding.   ------  Notes recorded by Georgiana Shore, RN on 06/15/2016 at 9:47 AM EDT s/w gentleman at home number  who states she is at work. Left message on cell VM for patient to contact the office.   ------  Notes recorded by Wellington Hampshire, MD on 06/14/2016 at 6:16 PM EDT Inform patient that stress test was normal. She is at low risk for endoscopy.       Signed   Electronically signed by Minna Merritts, MD on 06/12/16 at 1933 EDT  Imaging Related Medications   Medication  technetium tetrofosmin (TC-MYOVIEW) injection 14.4 millicurie  Route: Intravenous  Admin Amount: 31.5 millicurie  PRN Reason(s): radiopharmaceutical  Last Admin Time: 06/12/16 0817  Number of Expected Doses: 1  Most Recent Administration:  User Action Time Recorded Time Dose Route Site Comment Action Reason  Patty Sermons 06/12/16 4008 67/61/95 0932 67.1 millicurie Intravenous   Contrast Given   Full Administration Report            Medication  technetium tetrofosmin (TC-MYOVIEW) injection 30 millicurie  Route: Intravenous  Admin Amount: 30 millicurie  PRN Reason(s): radiopharmaceutical  Last Admin Time: 06/12/16 2458  Number of Expected Doses: 1  Most Recent Administration:  User Action Time Recorded Time Dose Route Site Comment Action Reason  Joesphine Bare, RT 06/12/16 0998 33/82/50 5397 67.34 millicurie Intravenous   Contrast Given   Full Administration Report            Original Order   Ordered On Ordered By   06/05/2016 2:58 PM Georgiana Shore, RN

## 2016-08-20 NOTE — Pre-Procedure Instructions (Signed)
Progress Notes Encounter Date: 06/05/2016 Wellington Hampshire, MD  Cardiology  Expand All Collapse All   [] Hide copied text [] Hover for attribution information     Cardiology Office Note   Date:  06/05/2016   ID:  Pernell, Dikes Mar 27, 1946, MRN [id number]  PCP:  Rusty Aus, MD          Cardiologist:   Kathlyn Sacramento, MD       Chief Complaint  Patient presents with  . other    NP. referred by Dr.Mills, gastro. for chest pain/cardiac testing for Eventual colonoscopy/EGD. Pt  c/o CP, sob. Reviewed meds with pt verbally.      History of Present Illness: April Larsen is a 71 y.o. female who Was referred by Dr. Vira Agar for evaluation of chest pain and preoperative cardiovascular evaluation before colonoscopy/EGD. She has no prior cardiac history. She has known history of ulcerative colitis and mild hyperlipidemia. She has been experiencing intermittent episodes of lower chest and upper epigastric pain which radiates to her throat and back. She had one prolonged episode that woke her up from sleep and lasted for about 45 minutes. She felt that her throat was closing and she had bitter taste in her mouth. These episodes are typically not triggered by physical activities. She had no recent shortness of breath, sweating or palpitations. She is not a smoker. She does have family history of coronary artery disease. Her father died at the age of 15 myocardial infarction. Her mother had a stroke. Abdominal ultrasound showed gallstones without evidence of acute cholecystitis. HIDA scan was normal.       Past Medical History:  Diagnosis Date  . Cancer ([place])    skin  . Diverticulitis          Past Surgical History:  Procedure Laterality Date  . BACK SURGERY    . CARPAL TUNNEL RELEASE             Current Outpatient Prescriptions  Medication Sig Dispense Refill  . amoxicillin (AMOXIL) 875 MG tablet Take 1 tablet (875 mg total) by mouth 2 (two)  times daily. 20 tablet 0  . brompheniramine-pseudoephedrine-DM 30-2-10 MG/5ML syrup Take 5 mLs by mouth 4 (four) times daily as needed. 118 mL 0  . budesonide (ENTOCORT EC) 3 MG 24 hr capsule Take 3 mg by mouth 3 (three) times a week.    . calcium carbonate (OSCAL) 1500 (600 Ca) MG TABS tablet Take 600 mg of elemental calcium by mouth 2 (two) times daily with a meal.    . ciprofloxacin (CIPRO) 500 MG tablet Take 1 tablet (500 mg total) by mouth 2 (two) times daily. 20 tablet 0  . cyclobenzaprine (FLEXERIL) 10 MG tablet Take 10 mg by mouth daily.  3  . DULoxetine (CYMBALTA) 60 MG capsule Take 60 mg by mouth daily.  3  . ESTRACE VAGINAL 0.1 MG/GM vaginal cream Place 1 application vaginally daily.  11  . ferrous sulfate 325 (65 FE) MG tablet Take 325 mg by mouth daily with breakfast.    . HYDROcodone-acetaminophen (NORCO/VICODIN) 5-325 MG tablet Take 1 tablet by mouth every 6 (six) hours as needed for moderate pain. 10 tablet 0  . lidocaine (LIDODERM) 5 % Place 1 patch onto the skin daily.  3  . metroNIDAZOLE (FLAGYL) 500 MG tablet Take 1 tablet (500 mg total) by mouth 2 (two) times daily. 20 tablet 0  . omeprazole (PRILOSEC) 20 MG capsule Take 20 mg by mouth 2 (two) times daily.  3  . ondansetron (ZOFRAN) 4 MG tablet Take 1 tablet (4 mg total) by mouth every 8 (eight) hours as needed for nausea or vomiting. 10 tablet 0  . oxyCODONE-acetaminophen (ROXICET) 5-325 MG tablet Take 1 tablet by mouth every 6 (six) hours as needed. 12 tablet 0  . triamterene-hydrochlorothiazide (MAXZIDE) 75-50 MG tablet Take 1 tablet by mouth daily. Reported on 09/03/2015  99  . Vitamin D, Ergocalciferol, (DRISDOL) 50000 units CAPS capsule Take 50,000 Units by mouth 3 (three) times a week.     No current facility-administered medications for this visit.     Allergies:   Sulfa antibiotics    Social History:  The patient  reports that she has never smoked. She has never used smokeless tobacco. She  reports that she does not drink alcohol or use drugs.   Family History:  The patient's family history includes Breast cancer (age of onset: 23) in her paternal aunt; Breast cancer (age of onset: 62) in her sister.    ROS:  Please see the history of present illness.   Otherwise, review of systems are positive for none.   All other systems are reviewed and negative.    PHYSICAL EXAM: VS:  BP 136/78   Pulse 100   Ht 5' 5.5" (1.664 m)   Wt 190 lb 8 oz (86.4 kg)   BMI 31.22 kg/m  , BMI Body mass index is 31.22 kg/m. GEN: Well nourished, well developed, in no acute distress  HEENT: normal  Neck: no JVD, carotid bruits, or masses Cardiac: RRR; no murmurs, rubs, or gallops,no edema  Respiratory:  clear to auscultation bilaterally, normal work of breathing GI: soft, nontender, nondistended, + BS MS: no deformity or atrophy  Skin: warm and dry, no rash Neuro:  Strength and sensation are intact Psych: euthymic mood, full affect   EKG:  EKG is ordered today. The ekg ordered today demonstrates normal sinus rhythm with occasional PVCs. No significant ST or T wave changes.   Recent Labs: 08/24/2015: ALT 19; BUN 21; Creatinine, Ser 1.08; Hemoglobin 13.7; Platelets 314; Potassium 3.4; Sodium 136    Lipid Panel Labs(Brief)  No results found for: CHOL, TRIG, HDL, CHOLHDL, VLDL, LDLCALC, LDLDIRECT         Wt Readings from Last 3 Encounters:  06/05/16 190 lb 8 oz (86.4 kg)  09/03/15 179 lb (81.2 kg)  08/31/15 185 lb (83.9 kg)      PAD Screen 06/05/2016  Previous PAD dx? No  Previous surgical procedure? No  Pain with walking? Yes  Subsides with rest? Yes  Feet/toe relief with dangling? Yes  Painful, non-healing ulcers? No  Extremities discolored? Yes      ASSESSMENT AND PLAN:  1.  Atypical chest pain: Given age and risk factors, I recommend evaluation with a treadmill nuclear stress test. Physical exam is unremarkable. EKG is also unremarkable other than  PVCs. It is highly possible that some of her symptoms are GI in nature with an element of GERD and possible esophageal spasm. The patient is going to continue with further GI workup.  2. Preoperative cardiovascular evaluation for endoscopy: Given the presence of chest pain, she would be evaluated with a stress test. If stress test is low risk, she can proceed at an overall low risk from a cardiac standpoint.    Disposition:   FU with me as needed.   Signed,  Kathlyn Sacramento, MD  06/05/2016 2:37 PM    [place]    Electronically signed  by Wellington Hampshire, MD at 06/05/2016 3:15 PM      Office Visit on 06/05/2016        Detailed Report

## 2016-08-21 ENCOUNTER — Ambulatory Visit: Payer: 59 | Admitting: Anesthesiology

## 2016-08-21 ENCOUNTER — Ambulatory Visit: Payer: 59

## 2016-08-21 ENCOUNTER — Encounter: Payer: Self-pay | Admitting: *Deleted

## 2016-08-21 ENCOUNTER — Ambulatory Visit
Admission: RE | Admit: 2016-08-21 | Discharge: 2016-08-21 | Disposition: A | Discharge status: Home / Self Care | Payer: 59 | Source: Ambulatory Visit | Attending: Surgery | Admitting: Surgery

## 2016-08-21 ENCOUNTER — Encounter
Admission: RE | Disposition: A | Discharge status: Home / Self Care | Payer: Self-pay | Source: Ambulatory Visit | Attending: Surgery

## 2016-08-21 DIAGNOSIS — K449 Diaphragmatic hernia without obstruction or gangrene: Secondary | ICD-10-CM | POA: Diagnosis not present

## 2016-08-21 DIAGNOSIS — B0229 Other postherpetic nervous system involvement: Secondary | ICD-10-CM | POA: Diagnosis not present

## 2016-08-21 DIAGNOSIS — K8020 Calculus of gallbladder without cholecystitis without obstruction: Secondary | ICD-10-CM | POA: Diagnosis not present

## 2016-08-21 DIAGNOSIS — K828 Other specified diseases of gallbladder: Secondary | ICD-10-CM | POA: Insufficient documentation

## 2016-08-21 DIAGNOSIS — K219 Gastro-esophageal reflux disease without esophagitis: Secondary | ICD-10-CM | POA: Insufficient documentation

## 2016-08-21 DIAGNOSIS — D649 Anemia, unspecified: Secondary | ICD-10-CM | POA: Insufficient documentation

## 2016-08-21 DIAGNOSIS — K8010 Calculus of gallbladder with chronic cholecystitis without obstruction: Secondary | ICD-10-CM | POA: Insufficient documentation

## 2016-08-21 DIAGNOSIS — Z882 Allergy status to sulfonamides status: Secondary | ICD-10-CM | POA: Diagnosis not present

## 2016-08-21 DIAGNOSIS — Z79899 Other long term (current) drug therapy: Secondary | ICD-10-CM | POA: Insufficient documentation

## 2016-08-21 DIAGNOSIS — Z90710 Acquired absence of both cervix and uterus: Secondary | ICD-10-CM | POA: Diagnosis not present

## 2016-08-21 DIAGNOSIS — I493 Ventricular premature depolarization: Secondary | ICD-10-CM | POA: Diagnosis not present

## 2016-08-21 DIAGNOSIS — M419 Scoliosis, unspecified: Secondary | ICD-10-CM | POA: Diagnosis not present

## 2016-08-21 DIAGNOSIS — Z803 Family history of malignant neoplasm of breast: Secondary | ICD-10-CM | POA: Insufficient documentation

## 2016-08-21 DIAGNOSIS — M1990 Unspecified osteoarthritis, unspecified site: Secondary | ICD-10-CM | POA: Insufficient documentation

## 2016-08-21 DIAGNOSIS — Z85828 Personal history of other malignant neoplasm of skin: Secondary | ICD-10-CM | POA: Diagnosis not present

## 2016-08-21 DIAGNOSIS — G43909 Migraine, unspecified, not intractable, without status migrainosus: Secondary | ICD-10-CM | POA: Insufficient documentation

## 2016-08-21 HISTORY — PX: CHOLECYSTECTOMY: SHX55

## 2016-08-21 HISTORY — DX: Ventricular premature depolarization: I49.3

## 2016-08-21 SURGERY — LAPAROSCOPIC CHOLECYSTECTOMY WITH INTRAOPERATIVE CHOLANGIOGRAM
Anesthesia: General | Site: Abdomen | Wound class: Clean Contaminated

## 2016-08-21 MED ORDER — HEPARIN SODIUM (PORCINE) 5000 UNIT/ML IJ SOLN
INTRAMUSCULAR | Status: AC
  Filled 2016-08-21: qty 1

## 2016-08-21 MED ORDER — PROMETHAZINE HCL 25 MG/ML IJ SOLN: 6.2500 mg | INTRAMUSCULAR | Status: DC | PRN

## 2016-08-21 MED ORDER — GLYCOPYRROLATE 0.2 MG/ML IJ SOLN
INTRAMUSCULAR | Status: AC
  Filled 2016-08-21: qty 1

## 2016-08-21 MED ORDER — ROCURONIUM BROMIDE 100 MG/10ML IV SOLN
INTRAVENOUS | Status: DC | PRN
  Administered 2016-08-21: 50 mg via INTRAVENOUS

## 2016-08-21 MED ORDER — SODIUM CHLORIDE 0.9 % IJ SOLN
INTRAMUSCULAR | Status: AC
  Filled 2016-08-21: qty 50

## 2016-08-21 MED ORDER — OXYCODONE HCL 5 MG PO TABS
ORAL_TABLET | ORAL | Status: AC
  Administered 2016-08-21: 5 mg via ORAL
  Filled 2016-08-21: qty 1

## 2016-08-21 MED ORDER — OXYCODONE HCL 5 MG PO TABS
5.0000 mg | ORAL_TABLET | Freq: Once | ORAL | Status: AC | PRN
  Administered 2016-08-21: 5 mg via ORAL

## 2016-08-21 MED ORDER — MIDAZOLAM HCL 2 MG/2ML IJ SOLN
INTRAMUSCULAR | Status: DC | PRN
  Administered 2016-08-21: 1 mg via INTRAVENOUS

## 2016-08-21 MED ORDER — ONDANSETRON HCL 4 MG/2ML IJ SOLN
INTRAMUSCULAR | Status: AC
  Filled 2016-08-21: qty 2

## 2016-08-21 MED ORDER — SUGAMMADEX SODIUM 200 MG/2ML IV SOLN
INTRAVENOUS | Status: DC | PRN
  Administered 2016-08-21: 160 mg via INTRAVENOUS

## 2016-08-21 MED ORDER — MEPERIDINE HCL 50 MG/ML IJ SOLN: 6.2500 mg | INTRAMUSCULAR | Status: DC | PRN

## 2016-08-21 MED ORDER — ROCURONIUM BROMIDE 50 MG/5ML IV SOLN
INTRAVENOUS | Status: AC
  Filled 2016-08-21: qty 1

## 2016-08-21 MED ORDER — DEXAMETHASONE SODIUM PHOSPHATE 10 MG/ML IJ SOLN
INTRAMUSCULAR | Status: DC | PRN
  Administered 2016-08-21: 6 mg via INTRAVENOUS

## 2016-08-21 MED ORDER — PROPOFOL 10 MG/ML IV BOLUS
INTRAVENOUS | Status: DC | PRN
  Administered 2016-08-21: 130 mg via INTRAVENOUS

## 2016-08-21 MED ORDER — DEXAMETHASONE SODIUM PHOSPHATE 10 MG/ML IJ SOLN
INTRAMUSCULAR | Status: AC
  Filled 2016-08-21: qty 1

## 2016-08-21 MED ORDER — FENTANYL CITRATE (PF) 100 MCG/2ML IJ SOLN: 25.0000 ug | INTRAMUSCULAR | Status: DC | PRN

## 2016-08-21 MED ORDER — FENTANYL CITRATE (PF) 100 MCG/2ML IJ SOLN
INTRAMUSCULAR | Status: AC
  Administered 2016-08-21: 25 ug via INTRAVENOUS
  Filled 2016-08-21: qty 2

## 2016-08-21 MED ORDER — OXYCODONE HCL 5 MG/5ML PO SOLN: 5.0000 mg | Freq: Once | ORAL | Status: AC | PRN

## 2016-08-21 MED ORDER — ONDANSETRON HCL 4 MG/2ML IJ SOLN
INTRAMUSCULAR | Status: DC | PRN
  Administered 2016-08-21: 4 mg via INTRAVENOUS

## 2016-08-21 MED ORDER — FENTANYL CITRATE (PF) 100 MCG/2ML IJ SOLN
INTRAMUSCULAR | Status: DC | PRN
  Administered 2016-08-21 (×2): 25 ug via INTRAVENOUS
  Administered 2016-08-21 (×2): 75 ug via INTRAVENOUS

## 2016-08-21 MED ORDER — PROPOFOL 10 MG/ML IV BOLUS
INTRAVENOUS | Status: AC
  Filled 2016-08-21: qty 20

## 2016-08-21 MED ORDER — FENTANYL CITRATE (PF) 100 MCG/2ML IJ SOLN
INTRAMUSCULAR | Status: AC
  Filled 2016-08-21: qty 2

## 2016-08-21 MED ORDER — LACTATED RINGERS IV SOLN
INTRAVENOUS | Status: DC
  Administered 2016-08-21: 07:00:00 via INTRAVENOUS

## 2016-08-21 MED ORDER — SUCCINYLCHOLINE CHLORIDE 20 MG/ML IJ SOLN
INTRAMUSCULAR | Status: AC
  Filled 2016-08-21: qty 1

## 2016-08-21 MED ORDER — LIDOCAINE HCL (PF) 2 % IJ SOLN
INTRAMUSCULAR | Status: AC
  Filled 2016-08-21: qty 2

## 2016-08-21 MED ORDER — PHENYLEPHRINE HCL 10 MG/ML IJ SOLN
INTRAMUSCULAR | Status: AC
  Filled 2016-08-21: qty 1

## 2016-08-21 MED ORDER — FENTANYL CITRATE (PF) 100 MCG/2ML IJ SOLN
25.0000 ug | INTRAMUSCULAR | Status: DC | PRN
  Administered 2016-08-21 (×2): 25 ug via INTRAVENOUS

## 2016-08-21 MED ORDER — SODIUM CHLORIDE 0.9 % IV SOLN
INTRAVENOUS | Status: DC | PRN
  Administered 2016-08-21: 08:00:00 via INTRAMUSCULAR

## 2016-08-21 MED ORDER — SUGAMMADEX SODIUM 200 MG/2ML IV SOLN
INTRAVENOUS | Status: AC
  Filled 2016-08-21: qty 2

## 2016-08-21 MED ORDER — HYDROCODONE-ACETAMINOPHEN 5-325 MG PO TABS: 1.0000 | ORAL_TABLET | ORAL | Status: DC | PRN

## 2016-08-21 MED ORDER — FENTANYL CITRATE (PF) 100 MCG/2ML IJ SOLN
25.0000 ug | INTRAMUSCULAR | Status: AC | PRN
  Administered 2016-08-21 (×6): 25 ug via INTRAVENOUS

## 2016-08-21 MED ORDER — MIDAZOLAM HCL 2 MG/2ML IJ SOLN
INTRAMUSCULAR | Status: AC
  Filled 2016-08-21: qty 2

## 2016-08-21 MED ORDER — ONDANSETRON HCL 4 MG/2ML IJ SOLN: 4.0000 mg | Freq: Once | INTRAMUSCULAR | Status: DC | PRN

## 2016-08-21 MED ORDER — HYDROCODONE-ACETAMINOPHEN 5-325 MG PO TABS: 1.0000 | ORAL_TABLET | ORAL | 0 refills | Status: DC | PRN

## 2016-08-21 MED ORDER — SODIUM CHLORIDE 0.9 % IV SOLN
INTRAVENOUS | Status: DC | PRN
  Administered 2016-08-21: 20 mL

## 2016-08-21 SURGICAL SUPPLY — 39 items
APPLIER CLIP ROT 10 11.4 M/L (STAPLE) ×2
BENZOIN TINCTURE PRP APPL 2/3 (GAUZE/BANDAGES/DRESSINGS) ×2 IMPLANT
CANISTER SUCT 1200ML W/VALVE (MISCELLANEOUS) ×2 IMPLANT
CANNULA DILATOR 10 W/SLV (CANNULA) ×2 IMPLANT
CATH REDDICK CHOLANGI 4FR 50CM (CATHETERS) ×2 IMPLANT
CHLORAPREP W/TINT 26ML (MISCELLANEOUS) ×2 IMPLANT
CLIP APPLIE ROT 10 11.4 M/L (STAPLE) ×1 IMPLANT
DRAPE SHEET LG 3/4 BI-LAMINATE (DRAPES) ×2 IMPLANT
ELECT REM PT RETURN 9FT ADLT (ELECTROSURGICAL) ×2
ELECTRODE REM PT RTRN 9FT ADLT (ELECTROSURGICAL) ×1 IMPLANT
GAUZE SPONGE 4X4 12PLY STRL (GAUZE/BANDAGES/DRESSINGS) ×2 IMPLANT
GLOVE BIO SURGEON STRL SZ7.5 (GLOVE) ×2 IMPLANT
GOWN STRL REUS W/ TWL LRG LVL3 (GOWN DISPOSABLE) ×4 IMPLANT
GOWN STRL REUS W/TWL LRG LVL3 (GOWN DISPOSABLE) ×4
IRRIGATION STRYKERFLOW (MISCELLANEOUS) ×1 IMPLANT
IRRIGATOR STRYKERFLOW (MISCELLANEOUS) ×2
IV NS 1000ML (IV SOLUTION) ×1
IV NS 1000ML BAXH (IV SOLUTION) ×1 IMPLANT
KIT RM TURNOVER STRD PROC AR (KITS) ×2 IMPLANT
LABEL OR SOLS (LABEL) ×2 IMPLANT
NDL INSUFF ACCESS 14 VERSASTEP (NEEDLE) ×2 IMPLANT
NDL SAFETY ECLIPSE 18X1.5 (NEEDLE) ×1 IMPLANT
NEEDLE FILTER BLUNT 18X 1/2SAF (NEEDLE) ×1
NEEDLE FILTER BLUNT 18X1 1/2 (NEEDLE) ×1 IMPLANT
NEEDLE HYPO 18GX1.5 SHARP (NEEDLE) ×1
NS IRRIG 500ML POUR BTL (IV SOLUTION) ×2 IMPLANT
PACK LAP CHOLECYSTECTOMY (MISCELLANEOUS) ×2 IMPLANT
SCISSORS METZENBAUM CVD 33 (INSTRUMENTS) ×2 IMPLANT
SEAL FOR SCOPE WARMER C3101 (MISCELLANEOUS) ×2 IMPLANT
SLEEVE ENDOPATH XCEL 5M (ENDOMECHANICALS) ×2 IMPLANT
STRIP CLOSURE SKIN 1/2X4 (GAUZE/BANDAGES/DRESSINGS) ×2 IMPLANT
SUT CHROMIC 5 0 RB 1 27 (SUTURE) ×2 IMPLANT
SUT VIC AB 0 CT2 27 (SUTURE) IMPLANT
SYR 20CC LL (SYRINGE) ×2 IMPLANT
SYR 3ML LL SCALE MARK (SYRINGE) ×4 IMPLANT
TROCAR XCEL NON-BLD 11X100MML (ENDOMECHANICALS) ×2 IMPLANT
TROCAR XCEL NON-BLD 5MMX100MML (ENDOMECHANICALS) ×2 IMPLANT
TUBING INSUFFLATOR HI FLOW (MISCELLANEOUS) ×2 IMPLANT
WATER STERILE IRR 1000ML POUR (IV SOLUTION) ×2 IMPLANT

## 2016-08-21 NOTE — Anesthesia Procedure Notes (Addendum)
Procedure Name: Intubation Date/Time: 08/21/2016 7:36 AM Performed by: Darlyne Russian Pre-anesthesia Checklist: Patient identified, Emergency Drugs available, Suction available, Patient being monitored and Timeout performed Patient Re-evaluated:Patient Re-evaluated prior to inductionOxygen Delivery Method: Circle system utilized Preoxygenation: Pre-oxygenation with 100% oxygen Intubation Type: IV induction Ventilation: Mask ventilation without difficulty Laryngoscope Size: Mac and 3 Grade View: Grade II Tube type: Oral Tube size: 7.0 mm Number of attempts: 1 Airway Equipment and Method: Stylet (Soft bite block placed between left molars.) Placement Confirmation: ETT inserted through vocal cords under direct vision,  positive ETCO2 and breath sounds checked- equal and bilateral Secured at: 21 cm Tube secured with: Tape Dental Injury: Teeth and Oropharynx as per pre-operative assessment

## 2016-08-21 NOTE — Anesthesia Postprocedure Evaluation (Signed)
Anesthesia Post Note  Patient: April Larsen  Procedure(s) Performed: Procedure(s) (LRB): LAPAROSCOPIC CHOLECYSTECTOMY WITH INTRAOPERATIVE CHOLANGIOGRAM (N/A)  Patient location during evaluation: PACU Anesthesia Type: General Level of consciousness: awake and alert and oriented Pain management: pain level controlled Vital Signs Assessment: post-procedure vital signs reviewed and stable Respiratory status: spontaneous breathing, nonlabored ventilation and respiratory function stable Cardiovascular status: blood pressure returned to baseline and stable Postop Assessment: no signs of nausea or vomiting Anesthetic complications: no     Last Vitals:  Vitals:   08/21/16 1022 08/21/16 1036  BP:  (!) 100/55  Pulse: 78 81  Resp: 17 14  Temp:  36.1 C    Last Pain:  Vitals:   08/21/16 1036  TempSrc:   PainSc: 5                  Haniya Fern

## 2016-08-21 NOTE — Discharge Instructions (Addendum)
Take Tylenol or Norco if needed for pain.  Should not drive or do anything dangerous when taking Norco.  Remove dressings on Saturday. May shower Sunday.  Avoid straining and heavy lifting in the first week after surgery.   AMBULATORY SURGERY  DISCHARGE INSTRUCTIONS   1) The drugs that you were given will stay in your system until tomorrow so for the next 24 hours you should not:  A) Drive an automobile B) Make any legal decisions C) Drink any alcoholic beverage   2) You may resume regular meals tomorrow.  Today it is better to start with liquids and gradually work up to solid foods.  You may eat anything you prefer, but it is better to start with liquids, then soup and crackers, and gradually work up to solid foods.   3) Please notify your doctor immediately if you have any unusual bleeding, trouble breathing, redness and pain at the surgery site, drainage, fever, or pain not relieved by medication.    4) Additional Instructions:        Please contact your physician with any problems or Same Day Surgery at [phone], Monday through Friday 6 am to 4 pm, or [place] at [facility] number at [phone].

## 2016-08-21 NOTE — Anesthesia Preprocedure Evaluation (Signed)
Anesthesia Evaluation  Patient identified by MRN, date of birth, ID band Patient awake    Reviewed: Allergy & Precautions, NPO status , Patient's Chart, lab work & pertinent test results  History of Anesthesia Complications Negative for: history of anesthetic complications  Airway Mallampati: II  TM Distance: >3 FB Neck ROM: Full    Dental no notable dental hx.    Pulmonary neg pulmonary ROS, neg sleep apnea, neg COPD,    breath sounds clear to auscultation- rhonchi (-) wheezing      Cardiovascular Exercise Tolerance: Good (-) hypertension(-) CAD and (-) Past MI  Rhythm:Regular Rate:Normal - Systolic murmurs and - Diastolic murmurs    Neuro/Psych  Headaches, negative psych ROS   GI/Hepatic Neg liver ROS, hiatal hernia, PUD, GERD  Medicated and Controlled,  Endo/Other  negative endocrine ROSneg diabetes  Renal/GU negative Renal ROS     Musculoskeletal  (+) Arthritis ,   Abdominal (+) + obese,   Peds  Hematology  (+) anemia ,   Anesthesia Other Findings Past Medical History: No date: Anemia     Comment: LAST HGB 15.2 ON 04-22-16 No date: Arthritis No date: Cancer ([place])     Comment: skin No date: Diverticulitis No date: Family history of adverse reaction to anesthes*     Comment: SISTER HARD TO WAKE UP No date: GERD (gastroesophageal reflux disease) No date: Headache     Comment: MIGRAINE No date: History of hiatal hernia No date: Postherpetic neuralgia     Comment: RIGHT 7TH.CRANIAL NERVE No date: PVC (premature ventricular contraction) No date: Scoliosis No date: Ulcerative colitis ([place])   Reproductive/Obstetrics                             Anesthesia Physical Anesthesia Plan  ASA: II  Anesthesia Plan: General   Post-op Pain Management:    Induction: Intravenous  Airway Management Planned: Oral ETT  Additional Equipment:   Intra-op Plan:   Post-operative Plan:  Extubation in OR  Informed Consent: I have reviewed the patients History and Physical, chart, labs and discussed the procedure including the risks, benefits and alternatives for the proposed anesthesia with the patient or authorized representative who has indicated his/her understanding and acceptance.   Dental advisory given  Plan Discussed with: CRNA and Anesthesiologist  Anesthesia Plan Comments:         Anesthesia Quick Evaluation

## 2016-08-21 NOTE — H&P (Signed)
  She comes for lap cholecystectomy.  She reports no change in condition since office exam.  Noted EGD and colonoscopy findings.  Labs noted.  Discussed plan for surgery.

## 2016-08-21 NOTE — Anesthesia Post-op Follow-up Note (Cosign Needed)
Anesthesia QCDR form completed.        

## 2016-08-21 NOTE — Op Note (Signed)
OPERATIVE REPORT  PREOPERATIVE DIAGNOSIS:  Chronic cholecystitis cholelithiasis  POSTOPERATIVE DIAGNOSIS: Chronic cholecystitis cholelithiasis  PROCEDURE: Laparoscopic cholecystectomy with cholangiogram  ANESTHESIA: General  SURGEON: Rochel Brome M.D.  INDICATIONS:She has history of epigastric pains and ultrasound findings of gallstones. Surgery was recommended for definitive treatment.  With the patient on the operating table in the supine position under general endotracheal anesthesia the abdomen was prepared with ChloraPrep solution and draped in a sterile manner. A short incision was made in the inferior aspect of the umbilicus and carried down to the deep fascia which was grasped with a laryngeal hook. A Veress needle was inserted aspirated and irrigated with a saline solution. The peritoneal cavity was insufflated with carbon dioxide. The Veress needle was removed. The 10 mm cannula was inserted. The 10 mm 0 laparoscope was inserted to view the peritoneal cavity.  Another incision was made in the epigastrium slightly to the right of the midline to introduce an 11 mm cannula. 2 incisions were made in the lateral aspect of the right upper quadrant to introduce 2   5 mm cannulas. Initial inspection revealed the smooth surface of the liver.  The gallbladder was retracted towards the right shoulder. Multiple adhesions were taken down with blunt and sharp dissection. The gallbladder neck was retracted inferiorly and laterally. Additional adhesions were taken down. The porta hepatis was identified. The gallbladder was mobilized with incision of the visceral peritoneum. The cystic duct was dissected free from surrounding structures. The cystic artery was dissected free from surrounding structures. A critical view of safety was demonstrated  An Endo Clip was placed across the cystic duct adjacent to the gallbladder neck. An incision was made in the cystic duct to introduce a Reddick catheter. The  cholangiogram was done with injection of half-strength Conray 60 dye. This demonstrated the bile ducts and flow of dye into the duodenum. No retained stones were identified. The cholangiogram appeared normal. The Reddick catheter was removed. The cystic duct was doubly ligated with endoclips and divided. The cystic artery was controlled with double endoclips and divided. The gallbladder was dissected free from the liver with use of hook and cautery and blunt dissection. Bleeding was minimal and hemostasis was intact. The gallbladder was delivered up through the infraumbilical incision opened and suctioned.  Multiple stones were removed with the stone scoop. The gallbladder and stones were submitted for routine pathology  The right upper quadrant was further inspected and could see hemostasis was intact. Estimated blood loss for the procedure approximately 3 cc. The cannulas were removed allowing carbon dioxide to escape from the peritoneal cavity. The skin incisions were closed with interrupted 5-0 chromic subcutaneous suture benzoin and Steri-Strips. Gauze dressings were applied with paper tape.  The patient appeared to be in satisfactory condition and was prepared for transfer to the recovery room  [place].D.

## 2016-08-21 NOTE — Transfer of Care (Signed)
Immediate Anesthesia Transfer of Care Note  Patient: April Larsen  Procedure(s) Performed: Procedure(s): LAPAROSCOPIC CHOLECYSTECTOMY WITH INTRAOPERATIVE CHOLANGIOGRAM (N/A)  Patient Location: PACU  Anesthesia Type:General  Level of Consciousness: patient cooperative and responds to stimulation  Airway & Oxygen Therapy: Patient Spontanous Breathing and Patient connected to nasal cannula oxygen  Post-op Assessment: Report given to RN and Post -op Vital signs reviewed and stable  Post vital signs: Reviewed and stable  Last Vitals:  Vitals:   08/21/16 0607 08/21/16 0910  BP: (!) 134/92 110/71  Pulse: 88 77  Resp: 18 14  Temp: 36.6 C 36.3 C    Last Pain:  Vitals:   08/21/16 0910  TempSrc: Temporal         Complications: No apparent anesthesia complications. Oral airway removed at 0914 when arousable.  Patent airway maintained. Dr. Tamala Julian aware of cut on lower left lip during wake up from biting lip.

## 2016-08-21 NOTE — H&P (Signed)
April Larsen is an 71 y.o. female.   Chief Complaint: Epigastric pains HPI: She has history of epigastric pains in the substernal area which radiates through to the back. The pains often, probably and last for a period of time and resolved abruptly. She reported no associated nausea or vomiting. She did have recent ultrasound demonstrating multiple gallstones. She also had recent cardiac stress testing which was negative. Recent upper endoscopy demonstrated hiatus hernia, recent colonoscopy demonstrated diverticulosis  Past Medical History:  Diagnosis Date  . Anemia    LAST HGB 15.2 ON 04-22-16  . Arthritis   . Cancer ([place])    skin  . Diverticulitis   . Family history of adverse reaction to anesthesia    SISTER HARD TO WAKE UP  . GERD (gastroesophageal reflux disease)   . Headache    MIGRAINE  . History of hiatal hernia   . Postherpetic neuralgia    RIGHT 7TH.CRANIAL NERVE  . PVC (premature ventricular contraction)   . Scoliosis   . Ulcerative colitis [facility])     Past Surgical History:  Procedure Laterality Date  . ABDOMINAL HYSTERECTOMY    . BACK SURGERY     L3-L4 AND L3-S1 DECOMPRESSION  . CARPAL TUNNEL RELEASE    . COLONOSCOPY  11/06/2005  . COLONOSCOPY WITH PROPOFOL N/A 07/29/2016   Procedure: COLONOSCOPY WITH PROPOFOL;  Surgeon: Manya Silvas, MD;  Location: [facility] ENDOSCOPY;  Service: Endoscopy;  Laterality: N/A;  . ESOPHAGOGASTRODUODENOSCOPY  11/06/2005  . ESOPHAGOGASTRODUODENOSCOPY (EGD) WITH PROPOFOL N/A 07/29/2016   Procedure: ESOPHAGOGASTRODUODENOSCOPY (EGD) WITH PROPOFOL;  Surgeon: Manya Silvas, MD;  Location: [facility] ENDOSCOPY;  Service: Endoscopy;  Laterality: N/A;    Family History  Problem Relation Age of Onset  . Breast cancer Sister 49  . Breast cancer Paternal Aunt 65   Social History:  reports that she has never smoked. She has never used smokeless tobacco. She reports that she does not drink alcohol or use drugs.  Allergies:  Allergies  Allergen  Reactions  . Sulfa Antibiotics Rash    Medications Prior to Admission  Medication Sig Dispense Refill  . acetaminophen (TYLENOL) 500 MG tablet Take 1,000 mg by mouth every 6 (six) hours as needed for moderate pain or headache.     . calcium carbonate (OSCAL) 1500 (600 Ca) MG TABS tablet Take 600 mg of elemental calcium by mouth 2 (two) times daily with a meal.    . cetirizine (ZYRTEC) 10 MG tablet Take 10 mg by mouth daily as needed for allergies.    . Cholecalciferol (VITAMIN D3) 2000 units capsule Take 2,000 Units by mouth daily.     . cyclobenzaprine (FLEXERIL) 10 MG tablet Take 10 mg by mouth at bedtime.   3  . DULoxetine (CYMBALTA) 60 MG capsule Take 60 mg by mouth at bedtime.   3  . ESTRACE VAGINAL 0.1 MG/GM vaginal cream Place 1 application vaginally at bedtime.   11  . ferrous sulfate 325 (65 FE) MG tablet Take 325 mg by mouth daily.     Marland Kitchen lidocaine (LIDODERM) 5 % Place 1 patch onto the skin daily as needed (pain).   3  . omeprazole (PRILOSEC) 20 MG capsule Take 20 mg by mouth 2 (two) times daily.  3    No results found for this or any previous visit (from the past 48 hour(s)). Dg Cholangiogram Operative  Result Date: 08/21/2016 CLINICAL DATA:  Gallstones EXAM: INTRAOPERATIVE CHOLANGIOGRAM TECHNIQUE: Cholangiographic images from the C-arm fluoroscopic device were submitted for interpretation  post-operatively. Please see the procedural report for the amount of contrast and the fluoroscopy time utilized. COMPARISON:  None. FINDINGS: Contrast fills the biliary tree without filling defects in the common bile duct. IMPRESSION: Patent biliary tree. Electronically Signed   By: Marybelle Killings M.D.   On: 08/21/2016 08:39    ROS she does report some recent fatigue. She reports no other recent acute illness such as cough cold or sore throat. She reports no dyspnea on exertion no exertional chest pains. Does have chronic backache.  Blood pressure 110/71, pulse 77, temperature 97.3 F (36.3 C),  resp. rate 14, height 5' 5.5" (1.664 m), weight 180 lb (81.6 kg), SpO2 94 %. Physical Exam  GENERAL:  Awake alert and oriented and in no acute distress.  HEENT:  Head is normocephalic.  Pupils are equal reactive to light.  Extraocular movements are intact. Sclera is clear.  Pharynx is clear.  NECK:  Supple with no palpable mass and no adenopathy.  LUNGS:  Clear without rales rhonchi or wheezes.  HEART:  Regular rhythm S1-S2, without murmur.  ABDOMEN: Soft and nontender  NEUROLOGIC:  Awake alert and moving all extremities.  EXTREMITIES:  Well-developed well-nourished with no dependent edema.  Assessment/Plan Chronic cholecystitis cholelithiasis  I discussed the plan for laparoscopic cholecystectomy.  Rochel Brome, MD 08/21/2016, 9:18 AM

## 2016-08-22 ENCOUNTER — Encounter: Payer: Self-pay | Admitting: Surgery

## 2016-08-25 LAB — SURGICAL PATHOLOGY

## 2016-09-14 DIAGNOSIS — K219 Gastro-esophageal reflux disease without esophagitis: Secondary | ICD-10-CM | POA: Diagnosis not present

## 2016-09-14 DIAGNOSIS — K5190 Ulcerative colitis, unspecified, without complications: Secondary | ICD-10-CM | POA: Diagnosis not present

## 2016-09-22 DIAGNOSIS — M5116 Intervertebral disc disorders with radiculopathy, lumbar region: Secondary | ICD-10-CM | POA: Diagnosis not present

## 2016-10-01 ENCOUNTER — Emergency Department
Admission: EM | Admit: 2016-10-01 | Discharge: 2016-10-01 | Disposition: A | Discharge status: Home / Self Care | Payer: PPO | Attending: Emergency Medicine | Admitting: Emergency Medicine

## 2016-10-01 ENCOUNTER — Emergency Department: Payer: PPO

## 2016-10-01 ENCOUNTER — Encounter: Payer: Self-pay | Admitting: *Deleted

## 2016-10-01 DIAGNOSIS — K5792 Diverticulitis of intestine, part unspecified, without perforation or abscess without bleeding: Secondary | ICD-10-CM | POA: Diagnosis not present

## 2016-10-01 DIAGNOSIS — K5730 Diverticulosis of large intestine without perforation or abscess without bleeding: Secondary | ICD-10-CM | POA: Diagnosis not present

## 2016-10-01 DIAGNOSIS — Z79899 Other long term (current) drug therapy: Secondary | ICD-10-CM | POA: Insufficient documentation

## 2016-10-01 DIAGNOSIS — Z85828 Personal history of other malignant neoplasm of skin: Secondary | ICD-10-CM | POA: Diagnosis not present

## 2016-10-01 DIAGNOSIS — R1032 Left lower quadrant pain: Secondary | ICD-10-CM | POA: Diagnosis present

## 2016-10-01 LAB — COMPREHENSIVE METABOLIC PANEL
ALT: 23 U/L (ref 14–54)
AST: 20 U/L (ref 15–41)
Albumin: 4.1 g/dL (ref 3.5–5.0)
Alkaline Phosphatase: 97 U/L (ref 38–126)
Anion gap: 9 (ref 5–15)
BUN: 24 mg/dL — ABNORMAL HIGH (ref 6–20)
CHLORIDE: 104 mmol/L (ref 101–111)
CO2: 20 mmol/L — ABNORMAL LOW (ref 22–32)
CREATININE: 0.8 mg/dL (ref 0.44–1.00)
Calcium: 9.2 mg/dL (ref 8.9–10.3)
Glucose, Bld: 116 mg/dL — ABNORMAL HIGH (ref 65–99)
POTASSIUM: 3.7 mmol/L (ref 3.5–5.1)
Sodium: 133 mmol/L — ABNORMAL LOW (ref 135–145)
TOTAL PROTEIN: 7.4 g/dL (ref 6.5–8.1)
Total Bilirubin: 1.2 mg/dL (ref 0.3–1.2)

## 2016-10-01 LAB — CBC
HEMATOCRIT: 44.5 % (ref 35.0–47.0)
Hemoglobin: 15.1 g/dL (ref 12.0–16.0)
MCH: 30.6 pg (ref 26.0–34.0)
MCHC: 34 g/dL (ref 32.0–36.0)
MCV: 89.9 fL (ref 80.0–100.0)
PLATELETS: 327 10*3/uL (ref 150–440)
RBC: 4.95 MIL/uL (ref 3.80–5.20)
RDW: 13.1 % (ref 11.5–14.5)
WBC: 22.5 10*3/uL — AB (ref 3.6–11.0)

## 2016-10-01 LAB — LIPASE, BLOOD: LIPASE: 23 U/L (ref 11–51)

## 2016-10-01 MED ORDER — KETOROLAC TROMETHAMINE 30 MG/ML IJ SOLN: 15.0000 mg | Freq: Once | INTRAMUSCULAR | Status: DC

## 2016-10-01 MED ORDER — IOPAMIDOL (ISOVUE-300) INJECTION 61%
100.0000 mL | Freq: Once | INTRAVENOUS | Status: AC | PRN
  Administered 2016-10-01: 100 mL via INTRAVENOUS
  Filled 2016-10-01: qty 100

## 2016-10-01 MED ORDER — ONDANSETRON 4 MG PO TBDP: 4.0000 mg | ORAL_TABLET | Freq: Three times a day (TID) | ORAL | 0 refills | Status: DC | PRN

## 2016-10-01 MED ORDER — OXYCODONE-ACETAMINOPHEN 5-325 MG PO TABS: 1.0000 | ORAL_TABLET | Freq: Four times a day (QID) | ORAL | 0 refills | Status: DC | PRN

## 2016-10-01 MED ORDER — METRONIDAZOLE 500 MG PO TABS: 500.0000 mg | ORAL_TABLET | Freq: Three times a day (TID) | ORAL | 0 refills | Status: DC

## 2016-10-01 MED ORDER — ONDANSETRON HCL 4 MG/2ML IJ SOLN
4.0000 mg | Freq: Once | INTRAMUSCULAR | Status: AC
  Administered 2016-10-01: 4 mg via INTRAVENOUS
  Filled 2016-10-01: qty 2

## 2016-10-01 MED ORDER — PIPERACILLIN-TAZOBACTAM 3.375 G IVPB
3.3750 g | Freq: Once | INTRAVENOUS | Status: AC
  Administered 2016-10-01: 3.375 g via INTRAVENOUS
  Filled 2016-10-01: qty 50

## 2016-10-01 MED ORDER — MORPHINE SULFATE (PF) 2 MG/ML IV SOLN
2.0000 mg | Freq: Once | INTRAVENOUS | Status: AC
  Administered 2016-10-01: 2 mg via INTRAVENOUS
  Filled 2016-10-01: qty 1

## 2016-10-01 MED ORDER — AMOXICILLIN-POT CLAVULANATE 875-125 MG PO TABS: 1.0000 | ORAL_TABLET | Freq: Two times a day (BID) | ORAL | 0 refills | Status: DC

## 2016-10-01 MED ORDER — SODIUM CHLORIDE 0.9 % IV BOLUS (SEPSIS)
1000.0000 mL | Freq: Once | INTRAVENOUS | Status: AC
  Administered 2016-10-01: 1000 mL via INTRAVENOUS

## 2016-10-01 NOTE — ED Provider Notes (Signed)
[facility] Emergency Department Provider Note  ____________________________________________  Time seen: Approximately 3:19 PM  I have reviewed the triage vital signs and the nursing notes.   HISTORY  Chief Complaint Groin Pain   HPI Danese Dorsainvil is a 71 y.o. female with a history of diverticulitis and ulcerative colitis who presents for left lower quadrant abdominal pain. Pain started yesterday has been constant, located in the left lower quadrant, nonradiating, currently 8 out of 10. Pain is similar to prior episodes of diverticulitis. Patient has had nausea but no vomiting. No fever or chills. Last normal bowel movement was 24 hours ago. No dysuria or hematuria. Patient called her GI doctor today who told her to come to the emergency room.  Past Medical History:  Diagnosis Date  . Anemia    LAST HGB 15.2 ON 04-22-16  . Arthritis   . Cancer ([place])    skin  . Diverticulitis   . Family history of adverse reaction to anesthesia    SISTER HARD TO WAKE UP  . GERD (gastroesophageal reflux disease)   . Headache    MIGRAINE  . History of hiatal hernia   . Postherpetic neuralgia    RIGHT 7TH.CRANIAL NERVE  . PVC (premature ventricular contraction)   . Scoliosis   . Ulcerative colitis ([place])     There are no active problems to display for this patient.   Past Surgical History:  Procedure Laterality Date  . ABDOMINAL HYSTERECTOMY    . BACK SURGERY     L3-L4 AND L3-S1 DECOMPRESSION  . CARPAL TUNNEL RELEASE    . CHOLECYSTECTOMY N/A 08/21/2016   Procedure: LAPAROSCOPIC CHOLECYSTECTOMY WITH INTRAOPERATIVE CHOLANGIOGRAM;  Surgeon: Leonie Green, MD;  Location: [facility] ORS;  Service: General;  Laterality: N/A;  . COLONOSCOPY  11/06/2005  . COLONOSCOPY WITH PROPOFOL N/A 07/29/2016   Procedure: COLONOSCOPY WITH PROPOFOL;  Surgeon: Manya Silvas, MD;  Location: [facility] ENDOSCOPY;  Service: Endoscopy;  Laterality: N/A;  . ESOPHAGOGASTRODUODENOSCOPY   11/06/2005  . ESOPHAGOGASTRODUODENOSCOPY (EGD) WITH PROPOFOL N/A 07/29/2016   Procedure: ESOPHAGOGASTRODUODENOSCOPY (EGD) WITH PROPOFOL;  Surgeon: Manya Silvas, MD;  Location: [facility] ENDOSCOPY;  Service: Endoscopy;  Laterality: N/A;    Prior to Admission medications   Medication Sig Start Date End Date Taking? Authorizing Provider  acetaminophen (TYLENOL) 500 MG tablet Take 1,000 mg by mouth every 6 (six) hours as needed for moderate pain or headache.     [provider]  amoxicillin-clavulanate (AUGMENTIN) 875-125 MG tablet Take 1 tablet by mouth 2 (two) times daily. 10/01/16 10/15/16  Rudene Re, MD  calcium carbonate (OSCAL) 1500 (600 Ca) MG TABS tablet Take 600 mg of elemental calcium by mouth 2 (two) times daily with a meal.    [provider]  cetirizine (ZYRTEC) 10 MG tablet Take 10 mg by mouth daily as needed for allergies.    [provider]  Cholecalciferol (VITAMIN D3) 2000 units capsule Take 2,000 Units by mouth daily.     [provider]  cyclobenzaprine (FLEXERIL) 10 MG tablet Take 10 mg by mouth at bedtime.  08/12/15   [provider]  DULoxetine (CYMBALTA) 60 MG capsule Take 60 mg by mouth at bedtime.  06/17/15   [provider]  ESTRACE VAGINAL 0.1 MG/GM vaginal cream Place 1 application vaginally at bedtime.  06/20/15   [provider]  ferrous sulfate 325 (65 FE) MG tablet Take 325 mg by mouth daily.     [provider]  HYDROcodone-acetaminophen [facility]) [phone]  MG tablet Take 1-2 tablets by mouth every 4 (four) hours as needed for moderate pain. 08/21/16   Leonie Green, MD  lidocaine (LIDODERM) 5 % Place 1 patch onto the skin daily as needed (pain).  05/28/15   [provider]  metroNIDAZOLE (FLAGYL) 500 MG tablet Take 1 tablet (500 mg total) by mouth 3 (three) times daily. 10/01/16 10/15/16  Rudene Re, MD  omeprazole (PRILOSEC) 20 MG capsule Take 20 mg by mouth 2 (two) times daily.  07/29/15   [provider]  ondansetron (ZOFRAN ODT) 4 MG disintegrating tablet Take 1 tablet (4 mg total) by mouth every 8 (eight) hours as needed for nausea or vomiting. 10/01/16   Rudene Re, MD  oxyCODONE-acetaminophen (ROXICET) 5-325 MG tablet Take 1 tablet by mouth every 6 (six) hours as needed. 10/01/16 10/01/17  Rudene Re, MD    Allergies Sulfa antibiotics  Family History  Problem Relation Age of Onset  . Breast cancer Sister 33  . Breast cancer Paternal Aunt 60    Social History Social History  Substance Use Topics  . Smoking status: Never Smoker  . Smokeless tobacco: Never Used  . Alcohol use No    Review of Systems  Constitutional: Negative for fever. Eyes: Negative for visual changes. ENT: Negative for sore throat. Neck: No neck pain  Cardiovascular: Negative for chest pain. Respiratory: Negative for shortness of breath. Gastrointestinal: + LLQ abdominal pain and nausea. No vomiting or diarrhea. Genitourinary: Negative for dysuria. Musculoskeletal: Negative for back pain. Skin: Negative for rash. Neurological: Negative for headaches, weakness or numbness. Psych: No SI or HI  ____________________________________________   PHYSICAL EXAM:  VITAL SIGNS: ED Triage Vitals [10/01/16 1328]  Enc Vitals Group     BP (!) 140/93     Pulse Rate 95     Resp 18     Temp      Temp src      SpO2 98 %     Weight 170 lb (77.1 kg)     Height 5' 5"  (1.651 m)     Head Circumference      Peak Flow      Pain Score 7     Pain Loc      Pain Edu?      Excl. in [place]?     Constitutional: Alert and oriented. Well appearing and in no apparent distress. HEENT:      Head: Normocephalic and atraumatic.         Eyes: Conjunctivae are normal. Sclera is non-icteric.       Mouth/Throat: Mucous membranes are moist.       Neck: Supple with no signs of meningismus. Cardiovascular: Regular rate and rhythm. No murmurs, gallops, or rubs. 2+ symmetrical distal pulses  are present in all extremities. No JVD. Respiratory: Normal respiratory effort. Lungs are clear to auscultation bilaterally. No wheezes, crackles, or rhonchi.  Gastrointestinal: Soft, ttp over the LLQ, non distended with positive bowel sounds. No rebound or guarding. Genitourinary: No CVA tenderness. Musculoskeletal: Nontender with normal range of motion in all extremities. No edema, cyanosis, or erythema of extremities. Neurologic: Normal speech and language. Face is symmetric. Moving all extremities. No gross focal neurologic deficits are appreciated. Skin: Skin is warm, dry and intact. No rash noted. Psychiatric: Mood and affect are normal. Speech and behavior are normal.  ____________________________________________   LABS (all labs ordered are listed, but only abnormal results are displayed)  Labs Reviewed  COMPREHENSIVE METABOLIC PANEL - Abnormal; Notable for the following:  Result Value   Sodium 133 (*)    CO2 20 (*)    Glucose, Bld 116 (*)    BUN 24 (*)    All other components within normal limits  CBC - Abnormal; Notable for the following:    WBC 22.5 (*)    All other components within normal limits  LIPASE, BLOOD  URINALYSIS, COMPLETE (UACMP) WITH MICROSCOPIC   ____________________________________________  EKG  none ____________________________________________  RADIOLOGY  CT a/p: Changes consistent with acute diverticulitis involving the sigmoid colon. The degree of inflammatory change is worse than that seen on the prior exam with significant edema within the sigmoid colonic wall. No perforation or abscess is identified at this time.  Moderate-sized hiatal hernia. ____________________________________________   PROCEDURES  Procedure(s) performed: None Procedures Critical Care performed:  None ____________________________________________   INITIAL IMPRESSION / ASSESSMENT AND PLAN / ED COURSE  71 y.o. female with a history of diverticulitis and  ulcerative colitis who presents for left lower quadrant abdominal pain and nausea x 2 days. Patient is well-appearing, in no distress, normal vital signs, tender to palpation on the left lower quadrant with no rebound or guarding. Blood work showing leukocytosis with a white count of 22.5, normal CMP and lipase. UA is pending. We'll send patient for CT abdomen and pelvis to evaluate for diverticulitis versus UC flare.    _________________________ 4:41 PM on 10/01/2016 -----------------------------------------  With patient's elevated white count and a CT concerning for acute diverticulitis with significant edema although no perforation or abscess was identified I recommended admission to the hospital for IV antibiotics. Patient feels improved and wishes to go home. Vitals are normal with no evidence of sepsis. Therefore I spoke with her gastroenterologist Dr. Vira Agar on the phone who will see patient at 9 AM tomorrow morning for close follow-up. Patient received a dose of IV Zosyn. She will be discharged home on Augmentin, Flagyl, Percocet and Zofran. I recommended immediate return to the emergency room if she spikes a fever, if she is vomiting, if her pain is worse. Patient agrees to return. We'll discharge her home at this time.  Pertinent labs & imaging results that were available during my care of the patient were reviewed by me and considered in my medical decision making (see chart for details).    ____________________________________________   FINAL CLINICAL IMPRESSION(S) / ED DIAGNOSES  Final diagnoses:  Diverticulitis      NEW MEDICATIONS STARTED DURING THIS VISIT:  New Prescriptions   AMOXICILLIN-CLAVULANATE (AUGMENTIN) 875-125 MG TABLET    Take 1 tablet by mouth 2 (two) times daily.   METRONIDAZOLE (FLAGYL) 500 MG TABLET    Take 1 tablet (500 mg total) by mouth 3 (three) times daily.   ONDANSETRON (ZOFRAN ODT) 4 MG DISINTEGRATING TABLET    Take 1 tablet (4 mg total) by mouth  every 8 (eight) hours as needed for nausea or vomiting.   OXYCODONE-ACETAMINOPHEN (ROXICET) 5-325 MG TABLET    Take 1 tablet by mouth every 6 (six) hours as needed.     Note:  This document was prepared using Dragon voice recognition software and may include unintentional dictation errors.    Rudene Re, MD 10/01/16 [phone]

## 2016-10-01 NOTE — ED Notes (Signed)
Patient transported to CT 

## 2016-10-01 NOTE — ED Triage Notes (Signed)
Pt to ED reporting left sided groin pain that began yesterday. PT reports hx of diverticulitis and feels as though this pain is similar to previous flares. PCP told pt to come to ED for CT. Pt report having nausea but denies vomiting. Pt also reports having constipation. Last BM was yesterday morning. Pt reports having taken stool softer and coffee without success today.

## 2016-10-06 DIAGNOSIS — K5732 Diverticulitis of large intestine without perforation or abscess without bleeding: Secondary | ICD-10-CM | POA: Diagnosis not present

## 2016-10-14 ENCOUNTER — Encounter: Payer: Self-pay | Admitting: Emergency Medicine

## 2016-10-14 ENCOUNTER — Emergency Department: Payer: PPO

## 2016-10-14 ENCOUNTER — Emergency Department
Admission: EM | Admit: 2016-10-14 | Discharge: 2016-10-14 | Disposition: A | Discharge status: Home / Self Care | Payer: PPO | Attending: Emergency Medicine | Admitting: Emergency Medicine

## 2016-10-14 DIAGNOSIS — Z79899 Other long term (current) drug therapy: Secondary | ICD-10-CM | POA: Insufficient documentation

## 2016-10-14 DIAGNOSIS — K449 Diaphragmatic hernia without obstruction or gangrene: Secondary | ICD-10-CM | POA: Diagnosis not present

## 2016-10-14 DIAGNOSIS — K5732 Diverticulitis of large intestine without perforation or abscess without bleeding: Secondary | ICD-10-CM

## 2016-10-14 DIAGNOSIS — R1032 Left lower quadrant pain: Secondary | ICD-10-CM | POA: Diagnosis not present

## 2016-10-14 DIAGNOSIS — R1030 Lower abdominal pain, unspecified: Secondary | ICD-10-CM | POA: Diagnosis present

## 2016-10-14 LAB — COMPREHENSIVE METABOLIC PANEL
ALBUMIN: 3.9 g/dL (ref 3.5–5.0)
ALK PHOS: 77 U/L (ref 38–126)
ALT: 20 U/L (ref 14–54)
AST: 20 U/L (ref 15–41)
Anion gap: 8 (ref 5–15)
BUN: 19 mg/dL (ref 6–20)
CALCIUM: 9.2 mg/dL (ref 8.9–10.3)
CO2: 23 mmol/L (ref 22–32)
CREATININE: 0.85 mg/dL (ref 0.44–1.00)
Chloride: 107 mmol/L (ref 101–111)
GFR calc Af Amer: >60 mL/min (ref >60)
GFR calc non Af Amer: >60 mL/min (ref >60)
Glucose, Bld: 96 mg/dL (ref 65–99)
Potassium: 3.4 mmol/L — ABNORMAL LOW (ref 3.5–5.1)
SODIUM: 138 mmol/L (ref 135–145)
Total Bilirubin: 0.6 mg/dL (ref 0.3–1.2)
Total Protein: 6.8 g/dL (ref 6.5–8.1)

## 2016-10-14 LAB — URINALYSIS, COMPLETE (UACMP) WITH MICROSCOPIC
Bilirubin Urine: NEGATIVE
Glucose, UA: NEGATIVE mg/dL
Hgb urine dipstick: NEGATIVE
KETONES UR: 5 mg/dL — AB
Nitrite: NEGATIVE
PH: 5 (ref 5.0–8.0)
Protein, ur: NEGATIVE mg/dL
SPECIFIC GRAVITY, URINE: 1.023 (ref 1.005–1.030)

## 2016-10-14 LAB — CBC
HCT: 42.1 % (ref 35.0–47.0)
Hemoglobin: 14.5 g/dL (ref 12.0–16.0)
MCH: 31.3 pg (ref 26.0–34.0)
MCHC: 34.5 g/dL (ref 32.0–36.0)
MCV: 90.6 fL (ref 80.0–100.0)
Platelets: 303 10*3/uL (ref 150–440)
RBC: 4.64 MIL/uL (ref 3.80–5.20)
RDW: 13.3 % (ref 11.5–14.5)
WBC: 12.1 10*3/uL — ABNORMAL HIGH (ref 3.6–11.0)

## 2016-10-14 LAB — LIPASE, BLOOD: Lipase: 27 U/L (ref 11–51)

## 2016-10-14 MED ORDER — PIPERACILLIN-TAZOBACTAM 3.375 G IVPB 30 MIN
3.3750 g | Freq: Once | INTRAVENOUS | Status: AC
  Administered 2016-10-14: 3.375 g via INTRAVENOUS

## 2016-10-14 MED ORDER — SODIUM CHLORIDE 0.9 % IV BOLUS (SEPSIS)
1000.0000 mL | Freq: Once | INTRAVENOUS | Status: AC
  Administered 2016-10-14: 1000 mL via INTRAVENOUS

## 2016-10-14 MED ORDER — AMOXICILLIN-POT CLAVULANATE 875-125 MG PO TABS: 1.0000 | ORAL_TABLET | Freq: Two times a day (BID) | ORAL | 0 refills | Status: AC

## 2016-10-14 MED ORDER — IOPAMIDOL (ISOVUE-300) INJECTION 61%
30.0000 mL | Freq: Once | INTRAVENOUS | Status: AC | PRN
  Administered 2016-10-14: 30 mL via ORAL
  Filled 2016-10-14: qty 30

## 2016-10-14 MED ORDER — METRONIDAZOLE 500 MG PO TABS: 500.0000 mg | ORAL_TABLET | Freq: Three times a day (TID) | ORAL | 0 refills | Status: AC

## 2016-10-14 MED ORDER — PIPERACILLIN-TAZOBACTAM 3.375 G IVPB 30 MIN
INTRAVENOUS | Status: AC
  Administered 2016-10-14: 3.375 g via INTRAVENOUS
  Filled 2016-10-14: qty 50

## 2016-10-14 MED ORDER — IOPAMIDOL (ISOVUE-300) INJECTION 61%
100.0000 mL | Freq: Once | INTRAVENOUS | Status: AC | PRN
  Administered 2016-10-14: 100 mL via INTRAVENOUS
  Filled 2016-10-14: qty 100

## 2016-10-14 NOTE — ED Provider Notes (Signed)
[facility] Emergency Department Provider Note  ____________________________________________  Time seen: Approximately 4:31 PM  I have reviewed the triage vital signs and the nursing notes.   HISTORY  Chief Complaint Abdominal Pain    HPI April Larsen is a 71 y.o. female sent to the ED by her gastrologist clinic for repeat evaluation due to ongoing left lower quadrant abdominal pain. She is being treated for diverticulitis, on Augmentin and Flagyl for the past 9 days reports that her abdominal pain is not improved. Worse with movement and change in position and sitting upright. Better with laying down flat and resting. Nonradiating, moderate intensity, sharp. No fever chills or sweats. Has a history of hospitalizations due to diverticulitis. Also has a history of ulcerative colitis, stopped taking the Entocort steroids that she is to control it a few weeks ago.     Past Medical History:  Diagnosis Date  . Anemia    LAST HGB 15.2 ON 04-22-16  . Arthritis   . Cancer ([place])    skin  . Diverticulitis   . Family history of adverse reaction to anesthesia    SISTER HARD TO WAKE UP  . GERD (gastroesophageal reflux disease)   . Headache    MIGRAINE  . History of hiatal hernia   . Postherpetic neuralgia    RIGHT 7TH.CRANIAL NERVE  . PVC (premature ventricular contraction)   . Scoliosis   . Ulcerative colitis ([place])      There are no active problems to display for this patient.    Past Surgical History:  Procedure Laterality Date  . ABDOMINAL HYSTERECTOMY    . BACK SURGERY     L3-L4 AND L3-S1 DECOMPRESSION  . CARPAL TUNNEL RELEASE    . CHOLECYSTECTOMY N/A 08/21/2016   Procedure: LAPAROSCOPIC CHOLECYSTECTOMY WITH INTRAOPERATIVE CHOLANGIOGRAM;  Surgeon: Leonie Green, MD;  Location: [facility] ORS;  Service: General;  Laterality: N/A;  . COLONOSCOPY  11/06/2005  . COLONOSCOPY WITH PROPOFOL N/A 07/29/2016   Procedure: COLONOSCOPY WITH PROPOFOL;   Surgeon: Manya Silvas, MD;  Location: [facility] ENDOSCOPY;  Service: Endoscopy;  Laterality: N/A;  . ESOPHAGOGASTRODUODENOSCOPY  11/06/2005  . ESOPHAGOGASTRODUODENOSCOPY (EGD) WITH PROPOFOL N/A 07/29/2016   Procedure: ESOPHAGOGASTRODUODENOSCOPY (EGD) WITH PROPOFOL;  Surgeon: Manya Silvas, MD;  Location: [facility] ENDOSCOPY;  Service: Endoscopy;  Laterality: N/A;     Prior to Admission medications   Medication Sig Start Date End Date Taking? Authorizing Provider  acetaminophen (TYLENOL) 500 MG tablet Take 1,000 mg by mouth every 6 (six) hours as needed for moderate pain or headache.     [provider]  amoxicillin-clavulanate (AUGMENTIN) 875-125 MG tablet Take 1 tablet by mouth 2 (two) times daily. 10/14/16 10/28/16  Carrie Mew, MD  calcium carbonate (OSCAL) 1500 (600 Ca) MG TABS tablet Take 600 mg of elemental calcium by mouth 2 (two) times daily with a meal.    [provider]  cetirizine (ZYRTEC) 10 MG tablet Take 10 mg by mouth daily as needed for allergies.    [provider]  Cholecalciferol (VITAMIN D3) 2000 units capsule Take 2,000 Units by mouth daily.     [provider]  cyclobenzaprine (FLEXERIL) 10 MG tablet Take 10 mg by mouth at bedtime.  08/12/15   [provider]  DULoxetine (CYMBALTA) 60 MG capsule Take 60 mg by mouth at bedtime.  06/17/15   [provider]  ESTRACE VAGINAL 0.1 MG/GM vaginal cream Place 1 application vaginally at bedtime.  06/20/15   [provider]  ferrous sulfate 325 (65 FE) MG tablet Take 325 mg by mouth daily.     [provider]  HYDROcodone-acetaminophen (NORCO) 5-325 MG tablet Take 1-2 tablets by mouth every 4 (four) hours as needed for moderate pain. 08/21/16   Leonie Green, MD  lidocaine (LIDODERM) 5 % Place 1 patch onto the skin daily as needed (pain).  05/28/15   [provider]  metroNIDAZOLE (FLAGYL) 500 MG tablet Take 1 tablet (500 mg total) by mouth 3 (three)  times daily. 10/14/16 10/28/16  Carrie Mew, MD  omeprazole (PRILOSEC) 20 MG capsule Take 20 mg by mouth 2 (two) times daily. 07/29/15   [provider]  ondansetron (ZOFRAN ODT) 4 MG disintegrating tablet Take 1 tablet (4 mg total) by mouth every 8 (eight) hours as needed for nausea or vomiting. 10/01/16   Rudene Re, MD  oxyCODONE-acetaminophen (ROXICET) 5-325 MG tablet Take 1 tablet by mouth every 6 (six) hours as needed. 10/01/16 10/01/17  Rudene Re, MD     Allergies Sulfa antibiotics   Family History  Problem Relation Age of Onset  . Breast cancer Sister 11  . Breast cancer Paternal Aunt 78    Social History Social History  Substance Use Topics  . Smoking status: Never Smoker  . Smokeless tobacco: Never Used  . Alcohol use No    Review of Systems  Constitutional:   No fever or chills.  ENT:   No sore throat. No rhinorrhea. Cardiovascular:   No chest pain or syncope. Respiratory:   No dyspnea or cough. Gastrointestinal:   Abdominal pain as above without vomiting or diarrhea.  Musculoskeletal:   Negative for focal pain or swelling All other systems reviewed and are negative except as documented above in ROS and HPI.  ____________________________________________   PHYSICAL EXAM:  VITAL SIGNS: ED Triage Vitals  Enc Vitals Group     BP 10/14/16 1311 126/82     Pulse Rate 10/14/16 1311 77     Resp 10/14/16 1311 17     Temp 10/14/16 1311 97.6 F (36.4 C)     Temp Source 10/14/16 1311 Oral     SpO2 10/14/16 1311 98 %     Weight 10/14/16 1311 175 lb (79.4 kg)     Height 10/14/16 1311 5' 6"  (1.676 m)     Head Circumference --      Peak Flow --      Pain Score 10/14/16 1310 5     Pain Loc --      Pain Edu? --      Excl. in [place]? --     Vital signs reviewed, nursing assessments reviewed.   Constitutional:   Alert and oriented. Well appearing and in no distress. Eyes:   No scleral icterus.  EOMI. No nystagmus. No conjunctival pallor.  PERRL. ENT   Head:   Normocephalic and atraumatic.   Nose:   No congestion/rhinnorhea.    Mouth/Throat:   MMM, no pharyngeal erythema. No peritonsillar mass.    Neck:   No meningismus. Full ROM Hematological/Lymphatic/Immunilogical:   No cervical lymphadenopathy. Cardiovascular:   RRR. Symmetric bilateral radial and DP pulses.  No murmurs.  Respiratory:   Normal respiratory effort without tachypnea/retractions. Breath sounds are clear and equal bilaterally. No wheezes/rales/rhonchi. Gastrointestinal:   Soft With suprapubic and left lower quadrant tenderness. Non distended. There is no CVA tenderness.  No rebound, rigidity, or guarding. Genitourinary:   deferred Musculoskeletal:   Normal range of motion in all extremities. No joint effusions.  No lower extremity tenderness.  No edema. Neurologic:   Normal speech and language.  Motor grossly intact. No gross focal neurologic deficits are appreciated.  Skin:    Skin is warm, dry and intact. No rash noted.  No petechiae, purpura, or bullae.  ____________________________________________    LABS (pertinent positives/negatives) (all labs ordered are listed, but only abnormal results are displayed) Labs Reviewed  COMPREHENSIVE METABOLIC PANEL - Abnormal; Notable for the following:       Result Value   Potassium 3.4 (*)    All other components within normal limits  CBC - Abnormal; Notable for the following:    WBC 12.1 (*)    All other components within normal limits  URINALYSIS, COMPLETE (UACMP) WITH MICROSCOPIC - Abnormal; Notable for the following:    Color, Urine YELLOW (*)    APPearance CLEAR (*)    Ketones, ur 5 (*)    Leukocytes, UA MODERATE (*)    Bacteria, UA RARE (*)    Squamous Epithelial / LPF 0-5 (*)    All other components within normal limits  LIPASE, BLOOD   ____________________________________________   EKG    ____________________________________________    RADIOLOGY  Ct Abdomen Pelvis W  Contrast  Result Date: 10/14/2016 CLINICAL DATA:  Persistent left lower quadrant pain failing to improve on oral antibiotics. History of recent diverticulitis. EXAM: CT ABDOMEN AND PELVIS WITH CONTRAST TECHNIQUE: Multidetector CT imaging of the abdomen and pelvis was performed using the standard protocol following bolus administration of intravenous contrast. CONTRAST:  138m ISOVUE-300 IOPAMIDOL (ISOVUE-300) INJECTION 61% COMPARISON:  10/01/2016 FINDINGS: Lower chest: Moderate to large hiatal hernia is again noted, incompletely included. Heart is normal size without pericardial effusion. No pneumonic consolidation. No effusion. Hepatobiliary: No focal liver abnormality is seen. Status post cholecystectomy. No enhancing mass of the liver nor biliary dilatation. No evidence of choledocholithiasis. Pancreas: Unremarkable. No pancreatic ductal dilatation or surrounding inflammatory changes. Spleen: Normal in size without focal abnormality. Adrenals/Urinary Tract: Normal bilateral adrenal glands and kidneys. No obstructive uropathy. Physiologically distended bladder. Stomach/Bowel: Much improved appearance of the sigmoid colon where there had been edema and pericolonic inflammation. Minimal residual pericolonic inflammatory change persists. No abscess or bowel obstruction. Significant circular muscle hypertrophy of the sigmoid is again noted. No new areas of inflammation are apparent. Small bowel loops are unremarkable. The appendix is normal. Vascular/Lymphatic: Aortic atherosclerosis. No enlarged abdominal or pelvic lymph nodes. Reproductive: Status post hysterectomy. No adnexal masses. Other: No abdominal wall hernia or abnormality. No abdominopelvic ascites. Musculoskeletal: Levoconvex scoliosis of the lumbar spine with thoracolumbar spondylosis is again noted. No acute osseous abnormality. IMPRESSION: 1. Significant improvement in the appearance of the sigmoid colon were there had been diverticulitis now  demonstrating interval decrease in pericolonic inflammation. Minimal residual pericolonic inflammatory change is noted. No bowel obstruction, free air or abscess. No new areas of bowel inflammation are identified. 2. Stable moderate to large hiatal hernia. 3. Status post cholecystectomy and hysterectomy. 4. Thoracolumbar spondylosis with levoscoliosis. Electronically Signed   By: DAshley RoyaltyM.D.   On: 10/14/2016 18:00    ____________________________________________   PROCEDURES Procedures  ____________________________________________   INITIAL IMPRESSION / ASSESSMENT AND PLAN / ED COURSE  Pertinent labs & imaging results that were available during my care of the patient were reviewed by me and considered in my medical decision making (see chart for details).  Patient presents with abdominal pain, concern for ulcerative colitis flare versus complicated diverticulitis. Not septic, vitals and initial labs unremarkable. We'll get a repeat  CT scan of the abdomen pelvis, give IV Zosyn and IV fluids. Low suspicion for bowel obstruction AAA dissection mesenteric ischemia.      ----------------------------------------- 7:01 PM on 10/14/2016 -----------------------------------------  CT scan much improved from before, labs unremarkable, vital signs normal. Patient well-appearing no acute distress, calm and comfortable. Mild left lower quadrant tenderness. Continue her antibiotics, follow up with Dr. Vira Agar, has an appointment in mid August.  ____________________________________________   FINAL CLINICAL IMPRESSION(S) / ED DIAGNOSES  Final diagnoses:  Left lower quadrant pain  Diverticulitis large intestine w/o perforation or abscess w/o bleeding      Current Discharge Medication List       Portions of this note were generated with dragon dictation software. Dictation errors may occur despite best attempts at proofreading.    Carrie Mew, MD 10/14/16 Lurline Hare

## 2016-10-14 NOTE — ED Triage Notes (Signed)
Patient presents to ED via POV from home. Patient was recently seen and dx with diverticulitis and put on augmentin and flagyl. Patient states tomorrow she will be finished with her augentin. Patient states she is still experiencing pain and that she called Dr. Dillard Essex office and they told her to come here for IV antibiotics and another CT scan. Patient c/o LLQ pain and nausea.

## 2016-10-14 NOTE — Discharge Instructions (Signed)
Results for orders placed or performed during the hospital encounter of 10/14/16  Lipase, blood  Result Value Ref Range   Lipase 27 11 - 51 U/L  Comprehensive metabolic panel  Result Value Ref Range   Sodium 138 135 - 145 mmol/L   Potassium 3.4 (L) 3.5 - 5.1 mmol/L   Chloride 107 101 - 111 mmol/L   CO2 23 22 - 32 mmol/L   Glucose, Bld 96 65 - 99 mg/dL   BUN 19 6 - 20 mg/dL   Creatinine, Ser 0.85 0.44 - 1.00 mg/dL   Calcium 9.2 8.9 - 10.3 mg/dL   Total Protein 6.8 6.5 - 8.1 g/dL   Albumin 3.9 3.5 - 5.0 g/dL   AST 20 15 - 41 U/L   ALT 20 14 - 54 U/L   Alkaline Phosphatase 77 38 - 126 U/L   Total Bilirubin 0.6 0.3 - 1.2 mg/dL   GFR calc non Af Amer >60 >60 mL/min   GFR calc Af Amer >60 >60 mL/min   Anion gap 8 5 - 15  CBC  Result Value Ref Range   WBC 12.1 (H) 3.6 - 11.0 K/uL   RBC 4.64 3.80 - 5.20 MIL/uL   Hemoglobin 14.5 12.0 - 16.0 g/dL   HCT 42.1 35.0 - 47.0 %   MCV 90.6 80.0 - 100.0 fL   MCH 31.3 26.0 - 34.0 pg   MCHC 34.5 32.0 - 36.0 g/dL   RDW 13.3 11.5 - 14.5 %   Platelets 303 150 - 440 K/uL  Urinalysis, Complete w Microscopic  Result Value Ref Range   Color, Urine YELLOW (A) YELLOW   APPearance CLEAR (A) CLEAR   Specific Gravity, Urine 1.023 1.005 - 1.030   pH 5.0 5.0 - 8.0   Glucose, UA NEGATIVE NEGATIVE mg/dL   Hgb urine dipstick NEGATIVE NEGATIVE   Bilirubin Urine NEGATIVE NEGATIVE   Ketones, ur 5 (A) NEGATIVE mg/dL   Protein, ur NEGATIVE NEGATIVE mg/dL   Nitrite NEGATIVE NEGATIVE   Leukocytes, UA MODERATE (A) NEGATIVE   RBC / HPF 0-5 0 - 5 RBC/hpf   WBC, UA 0-5 0 - 5 WBC/hpf   Bacteria, UA RARE (A) NONE SEEN   Squamous Epithelial / LPF 0-5 (A) NONE SEEN   Mucous PRESENT    Hyaline Casts, UA PRESENT    Ct Abdomen Pelvis W Contrast  Result Date: 10/14/2016 CLINICAL DATA:  Persistent left lower quadrant pain failing to improve on oral antibiotics. History of recent diverticulitis. EXAM: CT ABDOMEN AND PELVIS WITH CONTRAST TECHNIQUE: Multidetector CT  imaging of the abdomen and pelvis was performed using the standard protocol following bolus administration of intravenous contrast. CONTRAST:  137m ISOVUE-300 IOPAMIDOL (ISOVUE-300) INJECTION 61% COMPARISON:  10/01/2016 FINDINGS: Lower chest: Moderate to large hiatal hernia is again noted, incompletely included. Heart is normal size without pericardial effusion. No pneumonic consolidation. No effusion. Hepatobiliary: No focal liver abnormality is seen. Status post cholecystectomy. No enhancing mass of the liver nor biliary dilatation. No evidence of choledocholithiasis. Pancreas: Unremarkable. No pancreatic ductal dilatation or surrounding inflammatory changes. Spleen: Normal in size without focal abnormality. Adrenals/Urinary Tract: Normal bilateral adrenal glands and kidneys. No obstructive uropathy. Physiologically distended bladder. Stomach/Bowel: Much improved appearance of the sigmoid colon where there had been edema and pericolonic inflammation. Minimal residual pericolonic inflammatory change persists. No abscess or bowel obstruction. Significant circular muscle hypertrophy of the sigmoid is again noted. No new areas of inflammation are apparent. Small bowel loops are unremarkable. The appendix is normal. Vascular/Lymphatic: Aortic  atherosclerosis. No enlarged abdominal or pelvic lymph nodes. Reproductive: Status post hysterectomy. No adnexal masses. Other: No abdominal wall hernia or abnormality. No abdominopelvic ascites. Musculoskeletal: Levoconvex scoliosis of the lumbar spine with thoracolumbar spondylosis is again noted. No acute osseous abnormality. IMPRESSION: 1. Significant improvement in the appearance of the sigmoid colon were there had been diverticulitis now demonstrating interval decrease in pericolonic inflammation. Minimal residual pericolonic inflammatory change is noted. No bowel obstruction, free air or abscess. No new areas of bowel inflammation are identified. 2. Stable moderate to  large hiatal hernia. 3. Status post cholecystectomy and hysterectomy. 4. Thoracolumbar spondylosis with levoscoliosis. Electronically Signed   By: Ashley Royalty M.D.   On: 10/14/2016 18:00   Ct Abdomen Pelvis W Contrast  Result Date: 10/01/2016 CLINICAL DATA:  Left-sided groin pain, history of diverticulitis EXAM: CT ABDOMEN AND PELVIS WITH CONTRAST TECHNIQUE: Multidetector CT imaging of the abdomen and pelvis was performed using the standard protocol following bolus administration of intravenous contrast. CONTRAST:  169m ISOVUE-300 IOPAMIDOL (ISOVUE-300) INJECTION 61% COMPARISON:  08/24/2015 FINDINGS: Lower chest: Lung bases are free of acute infiltrate or sizable effusion. Moderate hiatal hernia is noted Hepatobiliary: No focal liver abnormality is seen. Status post cholecystectomy. No biliary dilatation. Pancreas: Unremarkable. No pancreatic ductal dilatation or surrounding inflammatory changes. Spleen: Normal in size without focal abnormality. Adrenals/Urinary Tract: Adrenal glands are unremarkable. Kidneys are normal, without renal calculi, focal lesion, or hydronephrosis. Bladder is unremarkable. Stomach/Bowel: There is evidence of diverticulosis within the colon. Significant pericolonic inflammatory changes are noted in the sigmoid colon consistent with diverticulitis. No abscess or perforation is identified. Significant edema is noted within the wall of the sigmoid proximally related to the inflammatory change. The appendix is within normal limits. No small bowel abnormality is seen. Vascular/Lymphatic: Aortic atherosclerosis. No enlarged abdominal or pelvic lymph nodes. Reproductive: Status post hysterectomy. No adnexal masses. Other: No abdominal wall hernia or abnormality. No abdominopelvic ascites. Musculoskeletal: Scoliosis is noted involving the thoracolumbar spine concave to the right in the lumbar spine. No acute bony abnormality is noted. IMPRESSION: Changes consistent with acute diverticulitis  involving the sigmoid colon. The degree of inflammatory change is worse than that seen on the prior exam with significant edema within the sigmoid colonic wall. No perforation or abscess is identified at this time. Moderate-sized hiatal hernia. Electronically Signed   By: MInez CatalinaM.D.   On: 10/01/2016 15:33

## 2016-10-14 NOTE — ED Notes (Signed)
Pt taken to CT.

## 2016-10-19 DIAGNOSIS — R197 Diarrhea, unspecified: Secondary | ICD-10-CM | POA: Diagnosis not present

## 2016-10-19 DIAGNOSIS — K5792 Diverticulitis of intestine, part unspecified, without perforation or abscess without bleeding: Secondary | ICD-10-CM | POA: Diagnosis not present

## 2016-10-19 DIAGNOSIS — K5190 Ulcerative colitis, unspecified, without complications: Secondary | ICD-10-CM | POA: Diagnosis not present

## 2016-10-21 ENCOUNTER — Other Ambulatory Visit
Admission: RE | Admit: 2016-10-21 | Discharge: 2016-10-21 | Disposition: A | Discharge status: Home / Self Care | Payer: PPO | Source: Ambulatory Visit | Attending: Nurse Practitioner | Admitting: Nurse Practitioner

## 2016-10-21 DIAGNOSIS — R197 Diarrhea, unspecified: Secondary | ICD-10-CM | POA: Insufficient documentation

## 2016-10-21 LAB — C DIFFICILE QUICK SCREEN W PCR REFLEX
C Diff antigen: NEGATIVE
C Diff interpretation: NOT DETECTED
C Diff toxin: NEGATIVE

## 2016-11-04 DIAGNOSIS — K5190 Ulcerative colitis, unspecified, without complications: Secondary | ICD-10-CM | POA: Diagnosis not present

## 2016-11-09 ENCOUNTER — Other Ambulatory Visit: Payer: Self-pay

## 2016-11-09 DIAGNOSIS — Z780 Asymptomatic menopausal state: Secondary | ICD-10-CM | POA: Insufficient documentation

## 2016-11-09 HISTORY — DX: Asymptomatic menopausal state: Z78.0

## 2016-11-11 ENCOUNTER — Encounter: Payer: Self-pay | Admitting: Surgery

## 2016-11-11 ENCOUNTER — Ambulatory Visit (INDEPENDENT_AMBULATORY_CARE_PROVIDER_SITE_OTHER): Payer: PPO | Admitting: Surgery

## 2016-11-11 VITALS — BP 134/86 | HR 96 | Temp 97.6°F | Ht 65.0 in | Wt 173.0 lb

## 2016-11-11 DIAGNOSIS — K5190 Ulcerative colitis, unspecified, without complications: Secondary | ICD-10-CM | POA: Diagnosis not present

## 2016-11-11 DIAGNOSIS — Z0000 Encounter for general adult medical examination without abnormal findings: Secondary | ICD-10-CM | POA: Diagnosis not present

## 2016-11-11 DIAGNOSIS — K5732 Diverticulitis of large intestine without perforation or abscess without bleeding: Secondary | ICD-10-CM

## 2016-11-11 DIAGNOSIS — E039 Hypothyroidism, unspecified: Secondary | ICD-10-CM | POA: Diagnosis not present

## 2016-11-11 DIAGNOSIS — K5733 Diverticulitis of large intestine without perforation or abscess with bleeding: Secondary | ICD-10-CM | POA: Diagnosis not present

## 2016-11-11 NOTE — Patient Instructions (Signed)
Diverticulitis Diverticulitis is when small pockets in your large intestine (colon) get infected or swollen. This causes stomach pain and watery poop (diarrhea). These pouches are called diverticula. They form in people who have a condition called diverticulosis. Follow these instructions at home: Medicines  Take over-the-counter and prescription medicines only as told by your doctor. These include: ? Antibiotics. ? Pain medicines. ? Fiber pills. ? Probiotics. ? Stool softeners.  Do not drive or use heavy machinery while taking prescription pain medicine.  If you were prescribed an antibiotic, take it as told. Do not stop taking it even if you feel better. General instructions  Follow a diet as told by your doctor.  When you feel better, your doctor may tell you to change your diet. You may need to eat a lot of fiber. Fiber makes it easier to poop (have bowel movements). Healthy foods with fiber include: ? Berries. ? Beans. ? Lentils. ? Green vegetables.  Exercise 3 or more times a week. Aim for 30 minutes each time. Exercise enough to sweat and make your heart beat faster.  Keep all follow-up visits as told. This is important. You may need to have an exam of the large intestine. This is called a colonoscopy. Contact a doctor if:  Your pain does not get better.  You have a hard time eating or drinking.  You are not pooping like normal. Get help right away if:  Your pain gets worse.  Your problems do not get better.  Your problems get worse very fast.  You have a fever.  You throw up (vomit) more than one time.  You have poop that is: ? Bloody. ? Black. ? Tarry. Summary  Diverticulitis is when small pockets in your large intestine (colon) get infected or swollen.  Take medicines only as told by your doctor.  Follow a diet as told by your doctor. This information is not intended to replace advice given to you by your health care provider. Make sure you discuss  any questions you have with your health care provider. Document Released: 09/02/2007 Document Revised: 04/02/2016 Document Reviewed: 04/02/2016 Elsevier Interactive Patient Education  2017 [employer].   Please give [place] a call if you decide on moving forward with surgery. As of right now, we will set your surgery to be on 12/10/2016 with Dr. Caroleen Hamman at [facility] Please look at your blue sheet incase you have any questions.

## 2016-11-11 NOTE — Progress Notes (Signed)
Surgical Consultation  11/11/2016  April Larsen is an 71 y.o. female.    HPI:   [name] is referred by Dr. Vira Agar for a history of recurrent diverticulitis. She reports that she was first diagnosed with diverticulitis about 8 years ago so far she has had at least 3 major episodes of diverticulitis with the most recent being a few weeks ago. She reports that the last time her pain was severe and required at least 3 weeks of antibiotics with some of them being IV. Of note she had had a laparoscopic cholecystectomy by Dr. Tamala Julian in 3 months ago and has recovered well from this. She has good cardiovascular performance able to do more than 4 Mets without any shortness of breath or chest pain. I have personally reviewed her CT scan from July 18 showing evidence of diverticulitis without perforation or abscess.  she did have a negative C. difficile because she was having some diarrhea. Her creatinine albumin are normal as well as her hemoglobin. He reports she still has intermittent pain in the left lower quadrant that is sharp in nature with no specific allergen eating or aggravating factors. No fevers no chills no other constitutional symptoms. She has lost some weight over the last 6 months or so  Past Medical History:  Diagnosis Date  . Anemia    LAST HGB 15.2 ON 04-22-16  . Arthritis   . Cancer ([place])    skin  . Diverticulitis   . Family history of adverse reaction to anesthesia    SISTER HARD TO WAKE UP  . GERD (gastroesophageal reflux disease)   . Headache    MIGRAINE  . History of hiatal hernia   . Postherpetic neuralgia    RIGHT 7TH.CRANIAL NERVE  . PVC (premature ventricular contraction)   . Scoliosis   . Ulcerative colitis [facility])     Past Surgical History:  Procedure Laterality Date  . ABDOMINAL HYSTERECTOMY    . BACK SURGERY     L3-L4 AND L3-S1 DECOMPRESSION  . CARPAL TUNNEL RELEASE    . CHOLECYSTECTOMY N/A 08/21/2016   Procedure: LAPAROSCOPIC CHOLECYSTECTOMY WITH  INTRAOPERATIVE CHOLANGIOGRAM;  Surgeon: Leonie Green, MD;  Location: [facility] ORS;  Service: General;  Laterality: N/A;  . COLONOSCOPY  11/06/2005  . COLONOSCOPY WITH PROPOFOL N/A 07/29/2016   Procedure: COLONOSCOPY WITH PROPOFOL;  Surgeon: Manya Silvas, MD;  Location[facility] ENDOSCOPY;  Service: Endoscopy;  Laterality: N/A;  . ESOPHAGOGASTRODUODENOSCOPY  11/06/2005  . ESOPHAGOGASTRODUODENOSCOPY (EGD) WITH PROPOFOL N/A 07/29/2016   Procedure: ESOPHAGOGASTRODUODENOSCOPY (EGD) WITH PROPOFOL;  Surgeon: Manya Silvas, MD;  Location: [facility] ENDOSCOPY;  Service: Endoscopy;  Laterality: N/A;    Family History  Problem Relation Age of Onset  . Breast cancer Sister 49  . Breast cancer Paternal Aunt 56    Social History:  reports that she has never smoked. She has never used smokeless tobacco. She reports that she does not drink alcohol or use drugs.  Allergies:  Allergies  Allergen Reactions  . Sulfa Antibiotics Rash    Medications reviewed.     ROS Full ROS performed and is otherwise negative other than what is stated in the HPI    BP 134/86   Pulse 96   Temp 97.6 F (36.4 C) (Oral)   Ht 5' 5"  (1.651 m)   Wt 78.5 kg (173 lb)   BMI 28.79 kg/m    Physical Exam  Constitutional: She is oriented to person, place, and time and well-developed, well-nourished, and in no distress.  No distress.  Eyes: Right eye exhibits no discharge. Left eye exhibits no discharge. No scleral icterus.  Neck: No JVD present. No tracheal deviation present.  Cardiovascular: Normal rate, regular rhythm and normal heart sounds.   Pulmonary/Chest: Effort normal. No stridor. No respiratory distress. She has no wheezes. She has no rales.  Abdominal: Soft. She exhibits no distension and no mass. There is no rebound and no guarding.  Mild TTP LLQ, no peritnitis  Musculoskeletal: Normal range of motion. She exhibits no edema.  Neurological: She is alert and oriented to person, place, and time. GCS score is  15.  Skin: Skin is warm and dry.  Psychiatric: Mood, memory, affect and judgment normal.  Nursing note and vitals reviewed.    Assessment/Plan: Chronic diverticulitis with recurrent episodes requiring significant extended antibiotic use. Discussed with the patient in detail about options of conservative management versus sigmoid colectomy. Scars with the patient in detail about each of the options. Risk, benefits and possible complications of surgery including but not limited to: Bleeding, infection, recurrence, need for colostomy, anastomotic leak, pain. She thinks she was to proceed with elective laparoscopic sigmoid colectomy. She will make sure she will call us back to confirm that that is the case. We will tentatively put her on the schedule in about 3-4 weeks for a laparoscopic sigmoid colectomy possible open. All questions were answered and extensive counseling was provided.     Caroleen Hamman, MD [facility] General Surgeon

## 2016-11-12 ENCOUNTER — Telehealth: Payer: Self-pay | Admitting: Surgery

## 2016-11-12 NOTE — Telephone Encounter (Signed)
Pt advised of pre op date/time and sx date. Sx: 12/10/16 with Dr Pabon-Dr Genevive Bi assisting-Laparoscopic sigmoid resection, possible open.  Pre op: 12/01/16 @ 9:30am--Office.   Patient made aware to call [phone], between 1-3:00pm the day before surgery, to find out what time to arrive.

## 2016-11-18 ENCOUNTER — Telehealth: Payer: Self-pay

## 2016-11-18 DIAGNOSIS — Z8719 Personal history of other diseases of the digestive system: Secondary | ICD-10-CM | POA: Diagnosis not present

## 2016-11-18 DIAGNOSIS — K5900 Constipation, unspecified: Secondary | ICD-10-CM | POA: Diagnosis not present

## 2016-11-18 NOTE — Telephone Encounter (Signed)
Patient called wanting to know if there is any way that she could have her surgery (Laparoscopic Sigmoid Colectomy possible open) sooner than 12/10/2016 by Dr. Dahlia Byes at [facility].  Looking at Dr. Corlis Leak schedule on 11/24/2016, he only has 3 patient's for that Tuesday at [facility]. Would she be able to be done on that day starting at noon? It seems that Pre-Admit is scheduled to be done on the same day. I also saw that Dr. Genevive Bi is available on 11/24/2016 since he was originally assisting Dr. Dahlia Byes.   Please let me know so I could inform the patient. Thank you.

## 2016-11-19 NOTE — Telephone Encounter (Signed)
In process of moving patient's surgery to 11/24/16 as instructed, I spoke with patient and she states that she was placed on Antibiotics by Dr. Vira Agar yesterday due to recurrent diverticulitis.  Surgery has been cancelled. Sent notification to Dr. Dahlia Byes at this time. Awaiting return phone call.

## 2016-11-20 MED ORDER — POLYETHYLENE GLYCOL 3350 17 GM/SCOOP PO POWD: 1.0000 | Freq: Once | ORAL | 0 refills | Status: AC

## 2016-11-20 MED ORDER — BISACODYL 5 MG PO TBEC: 20.0000 mg | DELAYED_RELEASE_TABLET | Freq: Once | ORAL | 0 refills | Status: DC

## 2016-11-20 MED ORDER — POLYETHYLENE GLYCOL 3350 17 GM/SCOOP PO POWD: 1.0000 | Freq: Once | ORAL | 0 refills | Status: DC

## 2016-11-20 MED ORDER — BISACODYL 5 MG PO TBEC: 20.0000 mg | DELAYED_RELEASE_TABLET | Freq: Once | ORAL | 0 refills | Status: AC

## 2016-11-20 NOTE — Telephone Encounter (Signed)
Call made to patient. She verbalizes understanding of all instructions. I answered all questions to patient's satisfaction. She was encouraged to call back with any further questions or concerns.

## 2016-11-20 NOTE — Telephone Encounter (Signed)
Spoke with Dr. Dahlia Byes in regards to current situation with patient's recent presumed recurrent diverticulitis and surgery scheduled. He would like to add patient to surgery schedule for 11/24/16 with Dr. Genevive Bi assisting and then see patient in office at 0900 on Monday morning.  Surgery has been added to schedule. Patient will be surgery admit. Booking sheet faxed to [facility] with new surgery date.  PAT was notified and will see patient on 11/23/16 at 0800am.  Patient will begin clear liquid diet on Sunday, 11/22/16 and will continue 11/23/16. She was told to hold on Bowel Prep medications until after her morning am appointment with surgeon. Then she will take these medications right after she gets home.

## 2016-11-23 ENCOUNTER — Ambulatory Visit (INDEPENDENT_AMBULATORY_CARE_PROVIDER_SITE_OTHER): Payer: PPO | Admitting: Surgery

## 2016-11-23 ENCOUNTER — Other Ambulatory Visit: Payer: Self-pay

## 2016-11-23 ENCOUNTER — Encounter
Admission: RE | Admit: 2016-11-23 | Discharge: 2016-11-23 | Disposition: A | Discharge status: Home / Self Care | Payer: PPO | Source: Ambulatory Visit | Attending: Surgery | Admitting: Surgery

## 2016-11-23 ENCOUNTER — Encounter: Payer: Self-pay | Admitting: Surgery

## 2016-11-23 VITALS — BP 133/80 | HR 88 | Temp 98.0°F | Ht 65.0 in | Wt 169.0 lb

## 2016-11-23 DIAGNOSIS — K219 Gastro-esophageal reflux disease without esophagitis: Secondary | ICD-10-CM | POA: Diagnosis present

## 2016-11-23 DIAGNOSIS — K660 Peritoneal adhesions (postprocedural) (postinfection): Secondary | ICD-10-CM | POA: Diagnosis present

## 2016-11-23 DIAGNOSIS — E669 Obesity, unspecified: Secondary | ICD-10-CM | POA: Diagnosis present

## 2016-11-23 DIAGNOSIS — Z79899 Other long term (current) drug therapy: Secondary | ICD-10-CM | POA: Diagnosis not present

## 2016-11-23 DIAGNOSIS — M419 Scoliosis, unspecified: Secondary | ICD-10-CM | POA: Diagnosis present

## 2016-11-23 DIAGNOSIS — M1990 Unspecified osteoarthritis, unspecified site: Secondary | ICD-10-CM | POA: Diagnosis present

## 2016-11-23 DIAGNOSIS — K5732 Diverticulitis of large intestine without perforation or abscess without bleeding: Secondary | ICD-10-CM

## 2016-11-23 DIAGNOSIS — K5792 Diverticulitis of intestine, part unspecified, without perforation or abscess without bleeding: Secondary | ICD-10-CM

## 2016-11-23 DIAGNOSIS — Z01812 Encounter for preprocedural laboratory examination: Secondary | ICD-10-CM | POA: Insufficient documentation

## 2016-11-23 DIAGNOSIS — F419 Anxiety disorder, unspecified: Secondary | ICD-10-CM | POA: Diagnosis present

## 2016-11-23 DIAGNOSIS — K5190 Ulcerative colitis, unspecified, without complications: Secondary | ICD-10-CM | POA: Insufficient documentation

## 2016-11-23 DIAGNOSIS — Z9049 Acquired absence of other specified parts of digestive tract: Secondary | ICD-10-CM | POA: Diagnosis not present

## 2016-11-23 DIAGNOSIS — Z803 Family history of malignant neoplasm of breast: Secondary | ICD-10-CM

## 2016-11-23 DIAGNOSIS — Z882 Allergy status to sulfonamides status: Secondary | ICD-10-CM | POA: Diagnosis not present

## 2016-11-23 DIAGNOSIS — Z90710 Acquired absence of both cervix and uterus: Secondary | ICD-10-CM | POA: Diagnosis not present

## 2016-11-23 DIAGNOSIS — Z6828 Body mass index (BMI) 28.0-28.9, adult: Secondary | ICD-10-CM | POA: Diagnosis not present

## 2016-11-23 DIAGNOSIS — Z9841 Cataract extraction status, right eye: Secondary | ICD-10-CM | POA: Diagnosis not present

## 2016-11-23 DIAGNOSIS — Z9842 Cataract extraction status, left eye: Secondary | ICD-10-CM | POA: Diagnosis not present

## 2016-11-23 DIAGNOSIS — D649 Anemia, unspecified: Secondary | ICD-10-CM | POA: Diagnosis present

## 2016-11-23 DIAGNOSIS — Z8711 Personal history of peptic ulcer disease: Secondary | ICD-10-CM | POA: Diagnosis not present

## 2016-11-23 HISTORY — DX: Anxiety disorder, unspecified: F41.9

## 2016-11-23 HISTORY — DX: Cataract (lens) fragments in eye following cataract surgery, bilateral: H59.023

## 2016-11-23 HISTORY — DX: Other specified postprocedural states: R11.2

## 2016-11-23 HISTORY — DX: Nausea with vomiting, unspecified: Z98.890

## 2016-11-23 LAB — SURGICAL PCR SCREEN
MRSA, PCR: NEGATIVE
STAPHYLOCOCCUS AUREUS: NEGATIVE

## 2016-11-23 MED ORDER — ERYTHROMYCIN BASE 500 MG PO TABS: 1000.0000 mg | ORAL_TABLET | Freq: Three times a day (TID) | ORAL | 0 refills | Status: DC

## 2016-11-23 MED ORDER — NEOMYCIN SULFATE 500 MG PO TABS: 1000.0000 mg | ORAL_TABLET | Freq: Three times a day (TID) | ORAL | 0 refills | Status: DC

## 2016-11-23 NOTE — Progress Notes (Signed)
Patient ID: April Larsen, female   DOB: 1945-07-12, 71 y.o.   MRN: [id number]  HPI April Larsen is a 71 y.o. female known to me with a history of recurrent diverticulitis. She had another recent attack and so Dr. Vira Agar who prescribed her Augmentin and ciprofloxacin. She reports significant improvement of symptoms. No abdominal pain. She is having some loose bowel movements. No fevers no chills. She is able to take by mouth. The review her labs and her white count was 12,000. She is otherwise doing well and in no acute distress or toxemia  HPI  Past Medical History:  Diagnosis Date  . Anemia    LAST HGB 15.2 ON 04-22-16  . Anxiety   . Arthritis   . Cancer ([place]) 2016   skin  . Cataract (lens) fragments in eye following cataract surgery, bilateral 2013   one eye done then 4 weeks later the other eye done.  . Diverticulitis   . Family history of adverse reaction to anesthesia    SISTER HARD TO WAKE UP and nausea and vomiting.  Marland Kitchen GERD (gastroesophageal reflux disease)   . Headache    MIGRAINE  . History of hiatal hernia   . PONV (postoperative nausea and vomiting)    nausea, does not remember vomiting.  Marland Kitchen Postherpetic neuralgia    RIGHT 7TH.CRANIAL NERVE from shingles followed by Bell's palsy  . PVC (premature ventricular contraction)   . Scoliosis   . Ulcerative colitis [facility])     Past Surgical History:  Procedure Laterality Date  . ABDOMINAL HYSTERECTOMY    . BACK SURGERY     L3-L4 AND L3-S1 DECOMPRESSION  . CARPAL TUNNEL RELEASE    . CHOLECYSTECTOMY N/A 08/21/2016   Procedure: LAPAROSCOPIC CHOLECYSTECTOMY WITH INTRAOPERATIVE CHOLANGIOGRAM;  Surgeon: Leonie Green, MD;  Location: [facility] ORS;  Service: General;  Laterality: N/A;  . COLONOSCOPY  11/06/2005  . COLONOSCOPY WITH PROPOFOL N/A 07/29/2016   Procedure: COLONOSCOPY WITH PROPOFOL;  Surgeon: Manya Silvas, MD;  [facility] ENDOSCOPY;  Service: Endoscopy;  Laterality: N/A;  .  ESOPHAGOGASTRODUODENOSCOPY  11/06/2005  . ESOPHAGOGASTRODUODENOSCOPY (EGD) WITH PROPOFOL N/A 07/29/2016   Procedure: ESOPHAGOGASTRODUODENOSCOPY (EGD) WITH PROPOFOL;  Surgeon: Manya Silvas, MD;  Location: [facility] ENDOSCOPY;  Service: Endoscopy;  Laterality: N/A;    Family History  Problem Relation Age of Onset  . Breast cancer Sister 33  . Breast cancer Paternal Aunt 75    Social History Social History  Substance Use Topics  . Smoking status: Never Smoker  . Smokeless tobacco: Never Used  . Alcohol use No    Allergies  Allergen Reactions  . Sulfa Antibiotics Rash    Current Outpatient Prescriptions  Medication Sig Dispense Refill  . acetaminophen (TYLENOL) 500 MG tablet Take 1,000 mg by mouth every 6 (six) hours as needed for moderate pain or headache.     . Calcium Carbonate-Vitamin D (CALCIUM 600+D PO) Take 1 tablet by mouth 2 (two) times daily.    . cholecalciferol (VITAMIN D) 1000 units tablet Take 2,000 Units by mouth daily.    . cyclobenzaprine (FLEXERIL) 10 MG tablet Take 10 mg by mouth at bedtime.   3  . DULoxetine (CYMBALTA) 60 MG capsule Take 60 mg by mouth at bedtime.   3  . ESTRACE VAGINAL 0.1 MG/GM vaginal cream Place 1 application vaginally at bedtime.   11  . ferrous sulfate 325 (65 FE) MG tablet Take 325 mg by mouth daily.     Marland Kitchen lidocaine (LIDODERM) 5 %  Place 1 patch onto the skin daily as needed (pain).   3  . omeprazole (PRILOSEC) 20 MG capsule Take 20 mg by mouth 2 (two) times daily.  3  . saccharomyces boulardii (FLORASTOR) 250 MG capsule Take 250 mg by mouth daily.    . sennosides-docusate sodium (SENOKOT-S) 8.6-50 MG tablet Take 2 tablets by mouth at bedtime.    Marland Kitchen erythromycin base (E-MYCIN) 500 MG tablet Take 2 tablets (1,000 mg total) by mouth 3 (three) times daily. 6 tablet 0  . neomycin (MYCIFRADIN) 500 MG tablet Take 2 tablets (1,000 mg total) by mouth 3 (three) times daily. 6 tablet 0   No current facility-administered medications for this visit.       Review of Systems Full ROS  was asked and was negative except for the information on the HPI  Physical Exam Blood pressure 133/80, pulse 88, temperature 98 F (36.7 C), temperature source Oral, height 5' 5"  (1.651 m), weight 76.7 kg (169 lb). CONSTITUTIONAL: NAD EYES: Pupils are equal, round, and reactive to light, Sclera are non-icteric. EARS, NOSE, MOUTH AND THROAT: The oropharynx is clear. The oral mucosa is pink and moist. Hearing is intact to voice. LYMPH NODES:  Lymph nodes in the neck are normal. RESPIRATORY:  Lungs are clear. There is normal respiratory effort, with equal breath sounds bilaterally, and without pathologic use of accessory muscles. CARDIOVASCULAR: Heart is regular without murmurs, gallops, or rubs. GI: The abdomen is soft, nontender, and nondistended. There are no palpable masses. There is no hepatosplenomegaly. There are normal bowel sounds in all quadrants. GU: Rectal deferred.   MUSCULOSKELETAL: Normal muscle strength and tone. No cyanosis or edema.   SKIN: Turgor is good and there are no pathologic skin lesions or ulcers. NEUROLOGIC: Motor and sensation is grossly normal. Cranial nerves are grossly intact. PSYCH:  Oriented to person, place and time. Affect is normal.  Data Reviewed  I have personally reviewed the patient's imaging, laboratory findings and medical records.    Assessment/Plan Recurrent chronic diverticulitis. Discussed with the patient in detail about rationale for elective sigmoid colectomy. Procedure discussed with the patient in detail. Risk, benefits and possible complications including but not limited to: Bleeding, infection, anastomotic leak, need for colostomy and re interventions. She understands and wishes to proceed. We will go ahead and schedule her for a laparoscopic sigmoid colectomy in the morning. We will do a bowel prep today to include oral antibiotics. We'll stop the Cipro and the omentum for now. All her questions were  answered   Caroleen Hamman, MD FACS General Surgeon 11/23/2016, 9:43 AM

## 2016-11-23 NOTE — Patient Instructions (Signed)
Stop your Augmentin and Cipro.   Begin the bowel prep as soon as you get home with antibiotics just sent to the Pharmacy. Follow instructions as written on the sheet.  Please call with any questions or concerns prior to surgery tomorrow.

## 2016-11-23 NOTE — Patient Instructions (Addendum)
  Your procedure is scheduled FB:[id number] August 28 , 2018. Report to Same Day Surgery. To find out your arrival time please call [phone] between 1PM - 3PM this afternoon.  Remember: Instructions that are not followed completely may result in serious medical risk, up to and including death, or upon the discretion of your surgeon and anesthesiologist your surgery may need to be rescheduled.    _x___ 1. Do not eat food or drink liquids after midnight. No gum chewing or hard candies. Complete   Prep as directed by Dr. Dahlia Byes. Clear liquids until 2 hours prior to arrival time. Water, apple juice,   Black coffee or black tea or Gatorade.   ____ 2. No Alcohol for 24 hours before or after surgery.   ____ 3. Bring all medications with you on the day of surgery if instructed.    __x__ 4. Notify your doctor if there is any change in your medical condition     (cold, fever, infections).    _____ 5. No smoking 24 hours prior to surgery.     Do not wear jewelry, make-up, hairpins, clips or nail polish.  Do not wear lotions, powders, or perfumes.   Do not shave 48 hours prior to surgery. Men may shave face and neck.  Do not bring valuables to the hospital.    [facility] is not responsible for any belongings or valuables.               Contacts, dentures or bridgework may not be worn into surgery.  Leave your suitcase in the car. After surgery it may be brought to your room.  For patients admitted to the hospital, discharge time is determined by your treatment team.   Patients discharged the day of surgery will not be allowed to drive home.    Please read over the following fact sheets that you were given:   [facility] Preparing for Surgery  _x__ Take these medicines the morning of surgery with A SIP OF WATER:    1. omeprazole (PRILOSEC)  2. Any antibiotics Dr. Dahlia Byes orders.   ____ Fleet Enema (as directed)   __x__ Use CHG Soap as directed on instruction sheet  ____ Use inhalers on  the day of surgery and bring to hospital day of surgery  ____ Stop metformin 2 days prior to surgery    ____ Take 1/2 of usual insulin dose the night before surgery and none on the morning of surgery.   ____ Stop Coumadin/Plavix/aspirin on does not apply.  _x___ Stop Anti-inflammatories such as Advil, Aleve, Ibuprofen, Motrin, Naproxen, Naprosyn, Goodies powders or aspirin  products. OK to take Tylenol.   ____ Stop supplements until after surgery.    ____ Bring C-Pap to the hospital.

## 2016-11-24 ENCOUNTER — Encounter: Admission: RE | Payer: Self-pay | Source: Ambulatory Visit

## 2016-11-24 ENCOUNTER — Encounter
Admission: RE | Disposition: A | Discharge status: Home / Self Care | Payer: Self-pay | Source: Ambulatory Visit | Attending: Surgery

## 2016-11-24 ENCOUNTER — Encounter: Payer: Self-pay | Admitting: *Deleted

## 2016-11-24 ENCOUNTER — Inpatient Hospital Stay: Payer: PPO | Admitting: Anesthesiology

## 2016-11-24 ENCOUNTER — Inpatient Hospital Stay: Admission: RE | Admit: 2016-11-24 | Payer: PPO | Source: Ambulatory Visit | Admitting: Surgery

## 2016-11-24 ENCOUNTER — Inpatient Hospital Stay
Admission: RE | Admit: 2016-11-24 | Discharge: 2016-11-28 | DRG: 337 | Disposition: A | Discharge status: Home / Self Care | Payer: PPO | Source: Ambulatory Visit | Attending: Surgery | Admitting: Surgery

## 2016-11-24 DIAGNOSIS — K660 Peritoneal adhesions (postprocedural) (postinfection): Secondary | ICD-10-CM | POA: Diagnosis present

## 2016-11-24 DIAGNOSIS — Z8711 Personal history of peptic ulcer disease: Secondary | ICD-10-CM | POA: Diagnosis not present

## 2016-11-24 DIAGNOSIS — K219 Gastro-esophageal reflux disease without esophagitis: Secondary | ICD-10-CM | POA: Diagnosis present

## 2016-11-24 DIAGNOSIS — Z9841 Cataract extraction status, right eye: Secondary | ICD-10-CM

## 2016-11-24 DIAGNOSIS — Z9842 Cataract extraction status, left eye: Secondary | ICD-10-CM

## 2016-11-24 DIAGNOSIS — Z803 Family history of malignant neoplasm of breast: Secondary | ICD-10-CM

## 2016-11-24 DIAGNOSIS — D649 Anemia, unspecified: Secondary | ICD-10-CM | POA: Diagnosis present

## 2016-11-24 DIAGNOSIS — M1990 Unspecified osteoarthritis, unspecified site: Secondary | ICD-10-CM | POA: Diagnosis present

## 2016-11-24 DIAGNOSIS — K5792 Diverticulitis of intestine, part unspecified, without perforation or abscess without bleeding: Secondary | ICD-10-CM | POA: Diagnosis not present

## 2016-11-24 DIAGNOSIS — E669 Obesity, unspecified: Secondary | ICD-10-CM | POA: Diagnosis present

## 2016-11-24 DIAGNOSIS — K5732 Diverticulitis of large intestine without perforation or abscess without bleeding: Secondary | ICD-10-CM | POA: Diagnosis present

## 2016-11-24 DIAGNOSIS — Z90710 Acquired absence of both cervix and uterus: Secondary | ICD-10-CM

## 2016-11-24 DIAGNOSIS — Z6828 Body mass index (BMI) 28.0-28.9, adult: Secondary | ICD-10-CM

## 2016-11-24 DIAGNOSIS — F419 Anxiety disorder, unspecified: Secondary | ICD-10-CM | POA: Diagnosis present

## 2016-11-24 DIAGNOSIS — Z882 Allergy status to sulfonamides status: Secondary | ICD-10-CM

## 2016-11-24 DIAGNOSIS — M419 Scoliosis, unspecified: Secondary | ICD-10-CM | POA: Diagnosis present

## 2016-11-24 DIAGNOSIS — Z9049 Acquired absence of other specified parts of digestive tract: Secondary | ICD-10-CM | POA: Diagnosis not present

## 2016-11-24 DIAGNOSIS — Z79899 Other long term (current) drug therapy: Secondary | ICD-10-CM | POA: Diagnosis not present

## 2016-11-24 HISTORY — PX: LAPAROSCOPIC LOW ANTERIOR RESECTION: SHX5904

## 2016-11-24 LAB — CBC
HCT: 40.9 % (ref 35.0–47.0)
HEMOGLOBIN: 14.2 g/dL (ref 12.0–16.0)
MCH: 31.6 pg (ref 26.0–34.0)
MCHC: 34.6 g/dL (ref 32.0–36.0)
MCV: 91.4 fL (ref 80.0–100.0)
Platelets: 314 10*3/uL (ref 150–440)
RBC: 4.47 MIL/uL (ref 3.80–5.20)
RDW: 12.9 % (ref 11.5–14.5)
WBC: 16.5 10*3/uL — ABNORMAL HIGH (ref 3.6–11.0)

## 2016-11-24 LAB — CREATININE, SERUM
Creatinine, Ser: 1.02 mg/dL — ABNORMAL HIGH (ref 0.44–1.00)
GFR calc Af Amer: >60 mL/min (ref >60)
GFR calc non Af Amer: 55 mL/min — ABNORMAL LOW (ref >60)

## 2016-11-24 SURGERY — RESECTION, RECTUM, LOW ANTERIOR, LAPAROSCOPIC
Anesthesia: General | Wound class: Clean Contaminated

## 2016-11-24 SURGERY — COLECTOMY, SIGMOID, LAPAROSCOPIC
Anesthesia: General

## 2016-11-24 MED ORDER — ONDANSETRON 4 MG PO TBDP: 4.0000 mg | ORAL_TABLET | Freq: Four times a day (QID) | ORAL | Status: DC | PRN

## 2016-11-24 MED ORDER — ENOXAPARIN SODIUM 40 MG/0.4ML ~~LOC~~ SOLN
40.0000 mg | SUBCUTANEOUS | Status: DC
  Administered 2016-11-25 – 2016-11-28 (×4): 40 mg via SUBCUTANEOUS
  Filled 2016-11-24 (×4): qty 0.4

## 2016-11-24 MED ORDER — FENTANYL CITRATE (PF) 100 MCG/2ML IJ SOLN
INTRAMUSCULAR | Status: AC
Start: 2016-11-24 — End: 2016-11-24
  Filled 2016-11-24: qty 2

## 2016-11-24 MED ORDER — PHENYLEPHRINE HCL 10 MG/ML IJ SOLN
INTRAMUSCULAR | Status: DC | PRN
  Administered 2016-11-24: 100 ug via INTRAVENOUS
  Administered 2016-11-24: 200 ug via INTRAVENOUS
  Administered 2016-11-24 (×6): 100 ug via INTRAVENOUS

## 2016-11-24 MED ORDER — DEXTROSE IN LACTATED RINGERS 5 % IV SOLN
INTRAVENOUS | Status: DC
  Administered 2016-11-24 – 2016-11-27 (×8): via INTRAVENOUS

## 2016-11-24 MED ORDER — KETOROLAC TROMETHAMINE 15 MG/ML IJ SOLN
15.0000 mg | Freq: Four times a day (QID) | INTRAMUSCULAR | Status: DC
  Administered 2016-11-24 – 2016-11-28 (×14): 15 mg via INTRAVENOUS
  Filled 2016-11-24 (×15): qty 1

## 2016-11-24 MED ORDER — CHLORHEXIDINE GLUCONATE CLOTH 2 % EX PADS: 6.0000 | MEDICATED_PAD | Freq: Once | CUTANEOUS | Status: DC

## 2016-11-24 MED ORDER — EPHEDRINE SULFATE 50 MG/ML IJ SOLN
INTRAMUSCULAR | Status: AC
  Filled 2016-11-24: qty 1

## 2016-11-24 MED ORDER — ONDANSETRON HCL 4 MG/2ML IJ SOLN: 4.0000 mg | Freq: Four times a day (QID) | INTRAMUSCULAR | Status: DC | PRN

## 2016-11-24 MED ORDER — MIDAZOLAM HCL 2 MG/2ML IJ SOLN
INTRAMUSCULAR | Status: DC | PRN
  Administered 2016-11-24: 2 mg via INTRAVENOUS

## 2016-11-24 MED ORDER — DIPHENHYDRAMINE HCL 12.5 MG/5ML PO ELIX
12.5000 mg | ORAL_SOLUTION | Freq: Four times a day (QID) | ORAL | Status: DC | PRN
  Filled 2016-11-24: qty 5

## 2016-11-24 MED ORDER — PROCHLORPERAZINE MALEATE 10 MG PO TABS
10.0000 mg | ORAL_TABLET | Freq: Four times a day (QID) | ORAL | Status: DC | PRN
  Filled 2016-11-24: qty 1

## 2016-11-24 MED ORDER — ONDANSETRON HCL 4 MG/2ML IJ SOLN
INTRAMUSCULAR | Status: DC | PRN
  Administered 2016-11-24: 4 mg via INTRAVENOUS

## 2016-11-24 MED ORDER — FENTANYL CITRATE (PF) 100 MCG/2ML IJ SOLN
25.0000 ug | INTRAMUSCULAR | Status: DC | PRN
  Administered 2016-11-24 (×2): 50 ug via INTRAVENOUS

## 2016-11-24 MED ORDER — GABAPENTIN 100 MG PO CAPS
200.0000 mg | ORAL_CAPSULE | Freq: Three times a day (TID) | ORAL | Status: DC
  Administered 2016-11-24 – 2016-11-25 (×5): 200 mg via ORAL
  Filled 2016-11-24 (×5): qty 2

## 2016-11-24 MED ORDER — SUGAMMADEX SODIUM 200 MG/2ML IV SOLN
INTRAVENOUS | Status: AC
Start: 2016-11-24 — End: 2016-11-24
  Filled 2016-11-24: qty 2

## 2016-11-24 MED ORDER — SUGAMMADEX SODIUM 200 MG/2ML IV SOLN
INTRAVENOUS | Status: DC | PRN
  Administered 2016-11-24: 160 mg via INTRAVENOUS

## 2016-11-24 MED ORDER — OXYCODONE HCL 5 MG/5ML PO SOLN: 5.0000 mg | Freq: Once | ORAL | Status: DC | PRN

## 2016-11-24 MED ORDER — SODIUM CHLORIDE 0.9 % IV SOLN
INTRAVENOUS | Status: DC | PRN
  Administered 2016-11-24: 70 mL

## 2016-11-24 MED ORDER — LIDOCAINE HCL 2 % EX GEL
CUTANEOUS | Status: AC
  Filled 2016-11-24: qty 5

## 2016-11-24 MED ORDER — EPHEDRINE SULFATE 50 MG/ML IJ SOLN
INTRAMUSCULAR | Status: DC | PRN
  Administered 2016-11-24 (×2): 10 mg via INTRAVENOUS

## 2016-11-24 MED ORDER — FENTANYL CITRATE (PF) 100 MCG/2ML IJ SOLN
INTRAMUSCULAR | Status: DC | PRN
  Administered 2016-11-24 (×2): 50 ug via INTRAVENOUS
  Administered 2016-11-24: 100 ug via INTRAVENOUS
  Administered 2016-11-24 (×2): 50 ug via INTRAVENOUS

## 2016-11-24 MED ORDER — ACETAMINOPHEN 500 MG PO TABS
1000.0000 mg | ORAL_TABLET | Freq: Four times a day (QID) | ORAL | Status: DC
  Administered 2016-11-24 – 2016-11-28 (×11): 1000 mg via ORAL
  Filled 2016-11-24 (×12): qty 2

## 2016-11-24 MED ORDER — PROPOFOL 10 MG/ML IV BOLUS
INTRAVENOUS | Status: AC
  Filled 2016-11-24: qty 20

## 2016-11-24 MED ORDER — PROMETHAZINE HCL 25 MG/ML IJ SOLN: 6.2500 mg | INTRAMUSCULAR | Status: DC | PRN

## 2016-11-24 MED ORDER — SCOPOLAMINE 1 MG/3DAYS TD PT72
MEDICATED_PATCH | TRANSDERMAL | Status: AC
  Administered 2016-11-24: 1.5 mg via TRANSDERMAL
  Filled 2016-11-24: qty 1

## 2016-11-24 MED ORDER — BUPIVACAINE LIPOSOME 1.3 % IJ SUSP
INTRAMUSCULAR | Status: AC
  Filled 2016-11-24: qty 20

## 2016-11-24 MED ORDER — KETOROLAC TROMETHAMINE 30 MG/ML IJ SOLN
INTRAMUSCULAR | Status: AC
  Filled 2016-11-24: qty 1

## 2016-11-24 MED ORDER — BUPIVACAINE-EPINEPHRINE (PF) 0.5% -1:200000 IJ SOLN
INTRAMUSCULAR | Status: AC
  Filled 2016-11-24: qty 30

## 2016-11-24 MED ORDER — ACETAMINOPHEN 10 MG/ML IV SOLN
INTRAVENOUS | Status: AC
  Filled 2016-11-24: qty 100

## 2016-11-24 MED ORDER — PANTOPRAZOLE SODIUM 40 MG IV SOLR
40.0000 mg | Freq: Two times a day (BID) | INTRAVENOUS | Status: DC
  Administered 2016-11-24 – 2016-11-27 (×7): 40 mg via INTRAVENOUS
  Filled 2016-11-24 (×12): qty 40

## 2016-11-24 MED ORDER — SCOPOLAMINE 1 MG/3DAYS TD PT72
1.0000 | MEDICATED_PATCH | Freq: Once | TRANSDERMAL | Status: AC
  Administered 2016-11-24: 1.5 mg via TRANSDERMAL

## 2016-11-24 MED ORDER — BUPIVACAINE-EPINEPHRINE (PF) 0.25% -1:200000 IJ SOLN
INTRAMUSCULAR | Status: AC
  Filled 2016-11-24: qty 30

## 2016-11-24 MED ORDER — FENTANYL CITRATE (PF) 100 MCG/2ML IJ SOLN
INTRAMUSCULAR | Status: AC
  Filled 2016-11-24: qty 2

## 2016-11-24 MED ORDER — MEPERIDINE HCL 50 MG/ML IJ SOLN: 6.2500 mg | INTRAMUSCULAR | Status: DC | PRN

## 2016-11-24 MED ORDER — MIDAZOLAM HCL 2 MG/2ML IJ SOLN
INTRAMUSCULAR | Status: AC
  Filled 2016-11-24: qty 2

## 2016-11-24 MED ORDER — DIPHENHYDRAMINE HCL 50 MG/ML IJ SOLN: 12.5000 mg | Freq: Four times a day (QID) | INTRAMUSCULAR | Status: DC | PRN

## 2016-11-24 MED ORDER — ACETAMINOPHEN 10 MG/ML IV SOLN
INTRAVENOUS | Status: DC | PRN
  Administered 2016-11-24: 1000 mg via INTRAVENOUS

## 2016-11-24 MED ORDER — ORAL CARE MOUTH RINSE
15.0000 mL | Freq: Two times a day (BID) | OROMUCOSAL | Status: DC
  Administered 2016-11-24 – 2016-11-28 (×8): 15 mL via OROMUCOSAL

## 2016-11-24 MED ORDER — PROPOFOL 10 MG/ML IV BOLUS
INTRAVENOUS | Status: DC | PRN
  Administered 2016-11-24: 130 mg via INTRAVENOUS

## 2016-11-24 MED ORDER — KETOROLAC TROMETHAMINE 30 MG/ML IJ SOLN
INTRAMUSCULAR | Status: DC | PRN
  Administered 2016-11-24: 15 mg via INTRAVENOUS

## 2016-11-24 MED ORDER — SODIUM CHLORIDE 0.9 % IJ SOLN
INTRAMUSCULAR | Status: AC
  Filled 2016-11-24: qty 50

## 2016-11-24 MED ORDER — BUPIVACAINE-EPINEPHRINE 0.25% -1:200000 IJ SOLN
INTRAMUSCULAR | Status: DC | PRN
  Administered 2016-11-24: 30 mL

## 2016-11-24 MED ORDER — ROCURONIUM BROMIDE 50 MG/5ML IV SOLN
INTRAVENOUS | Status: AC
  Filled 2016-11-24: qty 1

## 2016-11-24 MED ORDER — DEXAMETHASONE SODIUM PHOSPHATE 10 MG/ML IJ SOLN
INTRAMUSCULAR | Status: DC | PRN
  Administered 2016-11-24: 10 mg via INTRAVENOUS

## 2016-11-24 MED ORDER — ROCURONIUM BROMIDE 100 MG/10ML IV SOLN
INTRAVENOUS | Status: DC | PRN
  Administered 2016-11-24: 50 mg via INTRAVENOUS
  Administered 2016-11-24: 10 mg via INTRAVENOUS
  Administered 2016-11-24: 20 mg via INTRAVENOUS

## 2016-11-24 MED ORDER — PROCHLORPERAZINE EDISYLATE 5 MG/ML IJ SOLN
5.0000 mg | Freq: Four times a day (QID) | INTRAMUSCULAR | Status: DC | PRN
  Filled 2016-11-24: qty 2

## 2016-11-24 MED ORDER — DEXAMETHASONE SODIUM PHOSPHATE 10 MG/ML IJ SOLN
INTRAMUSCULAR | Status: AC
  Filled 2016-11-24: qty 1

## 2016-11-24 MED ORDER — OXYCODONE HCL 5 MG PO TABS: 5.0000 mg | ORAL_TABLET | Freq: Once | ORAL | Status: DC | PRN

## 2016-11-24 MED ORDER — SODIUM CHLORIDE 0.9 % IV SOLN
1.0000 g | Freq: Once | INTRAVENOUS | Status: AC
  Administered 2016-11-24: 1 g via INTRAVENOUS
  Filled 2016-11-24: qty 1

## 2016-11-24 MED ORDER — ONDANSETRON HCL 4 MG/2ML IJ SOLN
INTRAMUSCULAR | Status: AC
  Filled 2016-11-24: qty 2

## 2016-11-24 MED ORDER — METHOCARBAMOL 500 MG PO TABS
500.0000 mg | ORAL_TABLET | Freq: Four times a day (QID) | ORAL | Status: DC | PRN
  Filled 2016-11-24: qty 1

## 2016-11-24 MED ORDER — MORPHINE SULFATE (PF) 4 MG/ML IV SOLN
2.0000 mg | INTRAVENOUS | Status: DC | PRN
  Administered 2016-11-24 – 2016-11-25 (×5): 2 mg via INTRAVENOUS
  Filled 2016-11-24 (×5): qty 1

## 2016-11-24 MED ORDER — LACTATED RINGERS IV SOLN
INTRAVENOUS | Status: DC
  Administered 2016-11-24 (×3): via INTRAVENOUS

## 2016-11-24 MED ORDER — CYCLOBENZAPRINE HCL 10 MG PO TABS
5.0000 mg | ORAL_TABLET | Freq: Three times a day (TID) | ORAL | Status: DC
  Administered 2016-11-24 – 2016-11-25 (×5): 5 mg via ORAL
  Filled 2016-11-24 (×9): qty 1

## 2016-11-24 SURGICAL SUPPLY — 91 items
APPLIER CLIP 11 MED OPEN (CLIP)
APPLIER CLIP 13 LRG OPEN (CLIP)
APPLIER CLIP 5 13 M/L LIGAMAX5 (MISCELLANEOUS)
BLADE SURG SZ10 CARB STEEL (BLADE) IMPLANT
BULB RESERV EVAC DRAIN JP 100C (MISCELLANEOUS) ×3 IMPLANT
CANISTER SUCT 1200ML W/VALVE (MISCELLANEOUS) ×3 IMPLANT
CATH FOL LEG HOLDER (MISCELLANEOUS) ×3 IMPLANT
CATH ROBINSON RED A/P 16FR (CATHETERS) ×3 IMPLANT
CATH URET ROBINSON 16FR STRL (CATHETERS) IMPLANT
CHLORAPREP W/TINT 26ML (MISCELLANEOUS) ×3 IMPLANT
CLIP APPLIE 11 MED OPEN (CLIP) IMPLANT
CLIP APPLIE 13 LRG OPEN (CLIP) IMPLANT
CLIP APPLIE 5 13 M/L LIGAMAX5 (MISCELLANEOUS) IMPLANT
CNTNR SPEC 2.5X3XGRAD LEK (MISCELLANEOUS)
CONT SPEC 4OZ STER OR WHT (MISCELLANEOUS)
CONTAINER SPEC 2.5X3XGRAD LEK (MISCELLANEOUS) IMPLANT
DECANTER SPIKE VIAL GLASS SM (MISCELLANEOUS) ×3 IMPLANT
DEFOGGER SCOPE WARMER CLEARIFY (MISCELLANEOUS) ×3 IMPLANT
DERMABOND ADVANCED (GAUZE/BANDAGES/DRESSINGS)
DERMABOND ADVANCED .7 DNX12 (GAUZE/BANDAGES/DRESSINGS) IMPLANT
DRAIN CHANNEL JP 19F (MISCELLANEOUS) ×3 IMPLANT
DRAPE INCISE IOBAN 66X45 STRL (DRAPES) ×3 IMPLANT
DRAPE LAPAROTOMY T 102X78X121 (DRAPES) ×3 IMPLANT
DRAPE LEGGINS SURG 28X43 STRL (DRAPES) ×3 IMPLANT
DRAPE UNDER BUTTOCK W/FLU (DRAPES) ×3 IMPLANT
DRSG OPSITE POSTOP 4X8 (GAUZE/BANDAGES/DRESSINGS) IMPLANT
ELECT BLADE 6.5 EXT (BLADE) ×3 IMPLANT
ELECT CAUTERY BLADE 6.4 (BLADE) IMPLANT
ELECT REM PT RETURN 9FT ADLT (ELECTROSURGICAL) ×3
ELECTRODE REM PT RTRN 9FT ADLT (ELECTROSURGICAL) ×2 IMPLANT
GELPORT LAPAROSCOPIC (MISCELLANEOUS) ×3 IMPLANT
GLOVE BIO SURGEON STRL SZ7 (GLOVE) ×6 IMPLANT
GLOVE BIOGEL PI IND STRL 6.5 (GLOVE) ×2 IMPLANT
GLOVE BIOGEL PI INDICATOR 6.5 (GLOVE) ×1
GOWN STRL REUS W/ TWL LRG LVL3 (GOWN DISPOSABLE) ×6 IMPLANT
GOWN STRL REUS W/TWL LRG LVL3 (GOWN DISPOSABLE) ×3
HANDLE SUCTION POOLE (INSTRUMENTS) ×2 IMPLANT
HANDLE YANKAUER SUCT BULB TIP (MISCELLANEOUS) ×3 IMPLANT
IRRIGATION STRYKERFLOW (MISCELLANEOUS) ×2 IMPLANT
IRRIGATOR STRYKERFLOW (MISCELLANEOUS) ×3
IV NS 1000ML (IV SOLUTION) ×1
IV NS 1000ML BAXH (IV SOLUTION) ×2 IMPLANT
KIT PINK PAD W/HEAD ARE REST (MISCELLANEOUS) ×3
KIT PINK PAD W/HEAD ARM REST (MISCELLANEOUS) ×2 IMPLANT
KIT RM TURNOVER CYSTO AR (KITS) ×3 IMPLANT
L-HOOK LAP DISP 36CM (ELECTROSURGICAL)
LHOOK LAP DISP 36CM (ELECTROSURGICAL) IMPLANT
LIGASURE IMPACT 36 18CM CVD LR (INSTRUMENTS) IMPLANT
NEEDLE HYPO 22GX1.5 SAFETY (NEEDLE) ×3 IMPLANT
NS IRRIG 1000ML POUR BTL (IV SOLUTION) ×3 IMPLANT
PACK BASIN MAJOR ARMC (MISCELLANEOUS) ×3 IMPLANT
PACK COLON CLEAN CLOSURE (MISCELLANEOUS) ×3 IMPLANT
PACK LAP CHOLECYSTECTOMY (MISCELLANEOUS) IMPLANT
PAD PREP 24X41 OB/GYN DISP (PERSONAL CARE ITEMS) ×3 IMPLANT
PENCIL ELECTRO HAND CTR (MISCELLANEOUS) ×3 IMPLANT
RELOAD STAPLER BLUE 60MM (STAPLE) ×4 IMPLANT
RELOAD STAPLER WHITE 60MM (STAPLE) ×2 IMPLANT
SCISSORS METZENBAUM CVD 33 (INSTRUMENTS) IMPLANT
SHEARS HARMONIC ACE PLUS 36CM (ENDOMECHANICALS) ×3 IMPLANT
SLEEVE ENDOPATH XCEL 5M (ENDOMECHANICALS) ×3 IMPLANT
SOL PREP PVP 2OZ (MISCELLANEOUS) ×3
SOLUTION PREP PVP 2OZ (MISCELLANEOUS) ×2 IMPLANT
SPONGE DRAIN TRACH 4X4 STRL 2S (GAUZE/BANDAGES/DRESSINGS) ×3 IMPLANT
SPONGE LAP 18X18 5 PK (GAUZE/BANDAGES/DRESSINGS) ×6 IMPLANT
STAPLE ECHEON FLEX 60 POW ENDO (STAPLE) ×3 IMPLANT
STAPLER CIRCULAR 29MM (STAPLE) ×3 IMPLANT
STAPLER RELOAD BLUE 60MM (STAPLE) ×6
STAPLER RELOAD WHITE 60MM (STAPLE) ×3
STAPLER SKIN PROX 35W (STAPLE) ×3 IMPLANT
SUCT SIGMOIDOSCOPE TIP 18 W/TU (SUCTIONS) IMPLANT
SUCTION POOLE HANDLE (INSTRUMENTS) ×3
SURGILUBE 2OZ TUBE FLIPTOP (MISCELLANEOUS) ×3 IMPLANT
SUT ETHILON 2 0 FS 18 (SUTURE) ×3 IMPLANT
SUT MNCRL AB 4-0 PS2 18 (SUTURE) ×6 IMPLANT
SUT PDS AB 0 CT1 27 (SUTURE) ×6 IMPLANT
SUT PDS PLUS 0 (SUTURE)
SUT PDS PLUS AB 0 CT-2 (SUTURE) IMPLANT
SUT SILK 0 SH 30 (SUTURE) IMPLANT
SUT SILK 2 0 (SUTURE) ×1
SUT SILK 2 0SH CR/8 30 (SUTURE) IMPLANT
SUT SILK 2-0 18XBRD TIE 12 (SUTURE) ×2 IMPLANT
SUT VIC AB 3-0 SH 27 (SUTURE) ×1
SUT VIC AB 3-0 SH 27X BRD (SUTURE) ×2 IMPLANT
SYR 20CC LL (SYRINGE) ×6 IMPLANT
SYR 30ML LL (SYRINGE) ×6 IMPLANT
SYRINGE IRR TOOMEY STRL 70CC (SYRINGE) ×3 IMPLANT
TOWEL OR 17X26 4PK STRL BLUE (TOWEL DISPOSABLE) ×3 IMPLANT
TRAY FOLEY W/METER SILVER 16FR (SET/KITS/TRAYS/PACK) ×3 IMPLANT
TROCAR XCEL 12X100 BLDLESS (ENDOMECHANICALS) ×3 IMPLANT
TROCAR XCEL NON-BLD 5MMX100MML (ENDOMECHANICALS) ×3 IMPLANT
TUBING INSUF HEATED (TUBING) ×3 IMPLANT

## 2016-11-24 NOTE — Anesthesia Postprocedure Evaluation (Signed)
Anesthesia Post Note  Patient: April Larsen  Procedure(s) Performed: Procedure(s): LAPAROSCOPIC LOW ANTERIOR RESECTION  Patient location during evaluation: PACU Anesthesia Type: General Level of consciousness: awake and alert and oriented Pain management: pain level controlled Vital Signs Assessment: post-procedure vital signs reviewed and stable Respiratory status: spontaneous breathing, nonlabored ventilation and respiratory function stable Cardiovascular status: blood pressure returned to baseline and stable Postop Assessment: no signs of nausea or vomiting Anesthetic complications: no     Last Vitals:  Vitals:   11/24/16 1246 11/24/16 1321  BP: 132/68 137/80  Pulse: 81 80  Resp: 18 16  Temp: (!) 36.3 C 36.5 C  SpO2: 99% 98%    Last Pain:  Vitals:   11/24/16 1321  TempSrc: Oral  PainSc: 7                  [name]

## 2016-11-24 NOTE — Anesthesia Post-op Follow-up Note (Signed)
Anesthesia QCDR form completed.        

## 2016-11-24 NOTE — Progress Notes (Signed)
Postop check Doing well Feels "sore" AVSS Making urine  PE NAD Abd: soft, incision c/d/i. Serosanguinous output from JP. No peritonitis  A/P doing well No apparent complications

## 2016-11-24 NOTE — Anesthesia Procedure Notes (Signed)
Procedure Name: Intubation Date/Time: 11/24/2016 7:36 AM Performed by: Jonna Clark Pre-anesthesia Checklist: Patient identified, Patient being monitored, Timeout performed, Emergency Drugs available and Suction available Patient Re-evaluated:Patient Re-evaluated prior to induction Oxygen Delivery Method: Circle system utilized Preoxygenation: Pre-oxygenation with 100% oxygen Induction Type: IV induction Ventilation: Mask ventilation without difficulty Laryngoscope Size: Mac and 3 Grade View: Grade I Tube type: Oral Tube size: 7.0 mm Number of attempts: 1 Airway Equipment and Method: Stylet Placement Confirmation: ETT inserted through vocal cords under direct vision,  positive ETCO2 and breath sounds checked- equal and bilateral Secured at: 21 cm Tube secured with: Tape Dental Injury: Teeth and Oropharynx as per pre-operative assessment

## 2016-11-24 NOTE — Interval H&P Note (Signed)
History and Physical Interval Note:  11/24/2016 7:21 AM  April Larsen  has presented today for surgery, with the diagnosis of DIVERTICULITIS  The various methods of treatment have been discussed with the patient and family. After consideration of risks, benefits and other options for treatment, the patient has consented to  Procedure(s): LAPAROSCOPIC SIGMOID COLECTOMY (N/A) COLON RESECTION SIGMOID (N/A) as a surgical intervention .  The patient's history has been reviewed, patient examined, no change in status, stable for surgery.  I have reviewed the patient's chart and labs.  Questions were answered to the patient's satisfaction.     Ross

## 2016-11-24 NOTE — H&P (View-Only) (Signed)
Patient ID: April Larsen, female   DOB: 01/23/1946, 71 y.o.   MRN: [id number]  HPI April Larsen is a 71 y.o. female known to me with a history of recurrent diverticulitis. She had another recent attack and so Dr. Vira Agar who prescribed her Augmentin and ciprofloxacin. She reports significant improvement of symptoms. No abdominal pain. She is having some loose bowel movements. No fevers no chills. She is able to take by mouth. The review her labs and her white count was 12,000. She is otherwise doing well and in no acute distress or toxemia  HPI  Past Medical History:  Diagnosis Date  . Anemia    LAST HGB 15.2 ON 04-22-16  . Anxiety   . Arthritis   . Cancer ([place]) 2016   skin  . Cataract (lens) fragments in eye following cataract surgery, bilateral 2013   one eye done then 4 weeks later the other eye done.  . Diverticulitis   . Family history of adverse reaction to anesthesia    SISTER HARD TO WAKE UP and nausea and vomiting.  Marland Kitchen GERD (gastroesophageal reflux disease)   . Headache    MIGRAINE  . History of hiatal hernia   . PONV (postoperative nausea and vomiting)    nausea, does not remember vomiting.  Marland Kitchen Postherpetic neuralgia    RIGHT 7TH.CRANIAL NERVE from shingles followed by Bell's palsy  . PVC (premature ventricular contraction)   . Scoliosis   . Ulcerative colitis [facility])     Past Surgical History:  Procedure Laterality Date  . ABDOMINAL HYSTERECTOMY    . BACK SURGERY     L3-L4 AND L3-S1 DECOMPRESSION  . CARPAL TUNNEL RELEASE    . CHOLECYSTECTOMY N/A 08/21/2016   Procedure: LAPAROSCOPIC CHOLECYSTECTOMY WITH INTRAOPERATIVE CHOLANGIOGRAM;  Surgeon: Leonie Green, MD;  Location: [facility] ORS;  Service: General;  Laterality: N/A;  . COLONOSCOPY  11/06/2005  . COLONOSCOPY WITH PROPOFOL N/A 07/29/2016   Procedure: COLONOSCOPY WITH PROPOFOL;  Surgeon: Manya Silvas, MD;  Location: [facility] END[facility]COPY;  Service: Endoscopy;  Laterality: N/A;  .  ESOPHAGOGASTRODUODENOSCOPY  11/06/2005  . ESOPHAGOGASTRODUODENOSCOPY (EGD) WITH PROPOFOL N/A 07/29/2016   Procedure: ESOPHAGOGASTRODUODENOSCOPY (EGD) WITH PROPOFOL;  Surgeon: Manya Silvas, MD;  Location: [facility] ENDOSCOPY;  Service: Endoscopy;  Laterality: N/A;    Family History  Problem Relation Age of Onset  . Breast cancer Sister 51  . Breast cancer Paternal Aunt 74    Social History Social History  Substance Use Topics  . Smoking status: Never Smoker  . Smokeless tobacco: Never Used  . Alcohol use No    Allergies  Allergen Reactions  . Sulfa Antibiotics Rash    Current Outpatient Prescriptions  Medication Sig Dispense Refill  . acetaminophen (TYLENOL) 500 MG tablet Take 1,000 mg by mouth every 6 (six) hours as needed for moderate pain or headache.     . Calcium Carbonate-Vitamin D (CALCIUM 600+D PO) Take 1 tablet by mouth 2 (two) times daily.    . cholecalciferol (VITAMIN D) 1000 units tablet Take 2,000 Units by mouth daily.    . cyclobenzaprine (FLEXERIL) 10 MG tablet Take 10 mg by mouth at bedtime.   3  . DULoxetine (CYMBALTA) 60 MG capsule Take 60 mg by mouth at bedtime.   3  . ESTRACE VAGINAL 0.1 MG/GM vaginal cream Place 1 application vaginally at bedtime.   11  . ferrous sulfate 325 (65 FE) MG tablet Take 325 mg by mouth daily.     Marland Kitchen lidocaine (LIDODERM) 5 %  Place 1 patch onto the skin daily as needed (pain).   3  . omeprazole (PRILOSEC) 20 MG capsule Take 20 mg by mouth 2 (two) times daily.  3  . saccharomyces boulardii (FLORASTOR) 250 MG capsule Take 250 mg by mouth daily.    . sennosides-docusate sodium (SENOKOT-S) 8.6-50 MG tablet Take 2 tablets by mouth at bedtime.    Marland Kitchen erythromycin base (E-MYCIN) 500 MG tablet Take 2 tablets (1,000 mg total) by mouth 3 (three) times daily. 6 tablet 0  . neomycin (MYCIFRADIN) 500 MG tablet Take 2 tablets (1,000 mg total) by mouth 3 (three) times daily. 6 tablet 0   No current facility-administered medications for this visit.       Review of Systems Full ROS  was asked and was negative except for the information on the HPI  Physical Exam Blood pressure 133/80, pulse 88, temperature 98 F (36.7 C), temperature source Oral, height 5' 5"  (1.651 m), weight 76.7 kg (169 lb). CONSTITUTIONAL: NAD EYES: Pupils are equal, round, and reactive to light, Sclera are non-icteric. EARS, NOSE, MOUTH AND THROAT: The oropharynx is clear. The oral mucosa is pink and moist. Hearing is intact to voice. LYMPH NODES:  Lymph nodes in the neck are normal. RESPIRATORY:  Lungs are clear. There is normal respiratory effort, with equal breath sounds bilaterally, and without pathologic use of accessory muscles. CARDIOVASCULAR: Heart is regular without murmurs, gallops, or rubs. GI: The abdomen is soft, nontender, and nondistended. There are no palpable masses. There is no hepatosplenomegaly. There are normal bowel sounds in all quadrants. GU: Rectal deferred.   MUSCULOSKELETAL: Normal muscle strength and tone. No cyanosis or edema.   SKIN: Turgor is good and there are no pathologic skin lesions or ulcers. NEUROLOGIC: Motor and sensation is grossly normal. Cranial nerves are grossly intact. PSYCH:  Oriented to person, place and time. Affect is normal.  Data Reviewed  I have personally reviewed the patient's imaging, laboratory findings and medical records.    Assessment/Plan Recurrent chronic diverticulitis. Discussed with the patient in detail about rationale for elective sigmoid colectomy. Procedure discussed with the patient in detail. Risk, benefits and possible complications including but not limited to: Bleeding, infection, anastomotic leak, need for colostomy and re interventions. She understands and wishes to proceed. We will go ahead and schedule her for a laparoscopic sigmoid colectomy in the morning. We will do a bowel prep today to include oral antibiotics. We'll stop the Cipro and the omentum for now. All her questions were  answered   Caroleen Hamman, MD FACS General Surgeon 11/23/2016, 9:43 AM

## 2016-11-24 NOTE — Op Note (Signed)
PROCEDURES: 1. Laparoscopic lysis of adhesions 2. Laparoscopic Low anterior resection 3. Laparoscopic takedown of splenic flexure  Pre-operative Diagnosis: Chronic Diverticulitis  Post-operative Diagnosis: Same  Surgeon: Marjory Lies Terriah Reggio   Assistants: Dr. Genevive Bi  Anesthesia: General endotracheal anesthesia  ASA Class: 2  Surgeon: Caroleen Hamman , MD FACS  Anesthesia: Gen. with endotracheal tube  Findings: Chronic diverticulitis of the sigmoid, involving rectosigmoid junction. Tension free Anastomosis with no evidence of leak intra-operatively    Estimated Blood Loss: 100cc         Drains: 19 FR blake drain         Specimens: colon       Complications: none               Condition: stable  Procedure Details  The patient was seen again in the Holding Room. The benefits, complications, treatment options, and expected outcomes were discussed with the patient. The risks of bleeding, infection, recurrence of symptoms, failure to resolve symptoms,  bowel injury, any of which could require further surgery were reviewed with the patient.   The patient was taken to Operating Room, identified as April Larsen and the procedure verified.  A Time Out was held and the above information confirmed.  Prior to the induction of general anesthesia, antibiotic prophylaxis was administered. VTE prophylaxis was in place. General endotracheal anesthesia was then administered and tolerated well. After the induction, the abdomen was prepped with Chloraprep and draped in the sterile fashion. The patient was positioned in the supine position. Prior to the induction of general anesthesia, antibiotic prophylaxis was administered. VTE prophylaxis was in place. General endotracheal anesthesia was then administered and tolerated well. After the induction, the abdomen was prepped with Chloraprep and draped in the sterile fashion. The patient was positioned in lithotomy position. 7 cm incision was created as a  midline mini laparotomy. The abdominal cavity was entered under direct visualization and the GelPort device was placed. The greater omentum was dissected from the transverse colon under direct visualization using cautery. Pneumoperitoneum was obtained.  A 5 mm port was placed in the suprapubic area under direct visualization and pneumoperitoneum was obtained. There were dense adhesions from the omentum to the abdominal wall that where lysed in the standard fashion with the Harmonic scalpel. We also were able to place a 12 mm port in the right lower quadrant. There was significant adhesive disease in the pelvis from the sigmoid to the pelvic wall and also from the sigmoid to the ovary and the uterus. This adhesions were lysed with a combination of finger fracturing and Harmonic scalpel. The white line of pot was identified and divided and we mobilized the descending colon IN a lateral to medial fashion.  We identified and  preserved the ureter at all times. We were also able to mobilize the splenic flexure using Harmonic scalpel in the standard fashion. We identified the takeoff of the inferior mesenteric artery dissected the pedicle and divided using a 60 mm vascular echelon stapler in the standard fashion. Using the Harmonic's scalpel were able to divide the mesorectum and and also divided proximal to the mesentery of the descending colon. Because of the disease involve the rectosigmoid junction we mobilized the rectum from the peritoneal reflection using a harmonic scalpel.   Once we have an adequate visualization and mobilization we divided the colon distally distal to the  junction of the rectosigmoid area with multiple blue loads using the echelon stapler. We removed the GelPort and visualized the colon  in a direct fashion. We divided  the descending colon with standard 60 mm blue load. We opened the stump and measure the diameter of the bowel. A 29 mm dilator was the perfect size. A pursestring was  used after inserting the anvil device. Dr. Genevive Bi was able to pass a 29 mm standard EEA stapler device through the anus and we had a little bit of difficulty but were able to finally pass the device through the end of the rectal stump. Under direct visualization we perform an end to end anastomosis with the EEA device. A leak test was performed inflating the colon with a Toomey syringe and a rubber catheter. No evidence of leak was observed. There was also adequate hemostasis. A 19 Blake drain was placed in the pelvis. We were able to mobilize the omentum and I created an omental flap to attach it to the anastomosis. The drain was sutured in place with a 2-0 silk. All the laparoscopic ports were removed and a second look showed no evidence of any bleeding or any other injuries. We changed gloves and place a new tray to close the abdomen with a 0 PDS suture in a running fashion and the skin was closed with 4-0 Monocryl. Liposomal Marcaine was injected on all incision sites under direct visualization. Dermabond was used to coat all the skin incisions. Needle and laparotomy count were correct and there were no immediate occasions  Caroleen Hamman, MD, FACS

## 2016-11-24 NOTE — Transfer of Care (Signed)
Immediate Anesthesia Transfer of Care Note  Patient: April Larsen  Procedure(s) Performed: Procedure(s): LAPAROSCOPIC LOW ANTERIOR RESECTION  Patient Location: PACU  Anesthesia Type:General  Level of Consciousness: sedated and responds to stimulation  Airway & Oxygen Therapy: Patient Spontanous Breathing and Patient connected to face mask oxygen  Post-op Assessment: Report given to RN and Post -op Vital signs reviewed and stable  Post vital signs: Reviewed and stable  Last Vitals:  Vitals:   11/24/16 0617 11/24/16 1136  BP: (!) 135/94 112/64  Pulse: (!) 111 83  Resp: 20 14  Temp: (!) 35.9 C (!) 36.2 C  SpO2: 97% 100%    Last Pain:  Vitals:   11/24/16 0617  TempSrc: Tympanic  PainSc: 4          Complications: No apparent anesthesia complications

## 2016-11-24 NOTE — Progress Notes (Signed)
15 minute call to floor.

## 2016-11-24 NOTE — Anesthesia Preprocedure Evaluation (Signed)
Anesthesia Evaluation  Patient identified by MRN, date of birth, ID band Patient awake    Reviewed: Allergy & Precautions, NPO status , Patient's Chart, lab work & pertinent test results  History of Anesthesia Complications (+) PONV and history of anesthetic complications  Airway Mallampati: II  TM Distance: >3 FB Neck ROM: Full    Dental no notable dental hx.    Pulmonary neg pulmonary ROS, neg sleep apnea, neg COPD,    breath sounds clear to auscultation- rhonchi (-) wheezing      Cardiovascular Exercise Tolerance: Good (-) hypertension(-) CAD and (-) Past MI  Rhythm:Regular Rate:Normal - Systolic murmurs and - Diastolic murmurs    Neuro/Psych  Headaches, Anxiety    GI/Hepatic Neg liver ROS, hiatal hernia, PUD, GERD  ,  Endo/Other  negative endocrine ROSneg diabetes  Renal/GU negative Renal ROS     Musculoskeletal  (+) Arthritis ,   Abdominal (+) - obese,   Peds  Hematology  (+) anemia ,   Anesthesia Other Findings Past Medical History: No date: Anemia     Comment:  LAST HGB 15.2 ON 04-22-16 No date: Anxiety No date: Arthritis 2016: Cancer [facility])     Comment:  skin 2013: Cataract (lens) fragments in eye following cataract surgery,  bilateral     Comment:  one eye done then 4 weeks later the other eye done. No date: Diverticulitis No date: Family history of adverse reaction to anesthesia     Comment:  SISTER HARD TO WAKE UP and nausea and vomiting. No date: GERD (gastroesophageal reflux disease) No date: Headache     Comment:  MIGRAINE No date: History of hiatal hernia No date: PONV (postoperative nausea and vomiting)     Comment:  nausea, does not remember vomiting. No date: Postherpetic neuralgia     Comment:  RIGHT 7TH.CRANIAL NERVE from shingles followed by Bell's              palsy No date: PVC (premature ventricular contraction) No date: Scoliosis No date: Ulcerative colitis ([place])   Reproductive/Obstetrics                             Anesthesia Physical Anesthesia Plan  ASA: II  Anesthesia Plan: General   Post-op Pain Management:    Induction: Intravenous  PONV Risk Score and Plan: 3 and Ondansetron, Dexamethasone, Midazolam and Propofol infusion  Airway Management Planned: Oral ETT  Additional Equipment:   Intra-op Plan:   Post-operative Plan: Extubation in OR  Informed Consent: I have reviewed the patients History and Physical, chart, labs and discussed the procedure including the risks, benefits and alternatives for the proposed anesthesia with the patient or authorized representative who has indicated his/her understanding and acceptance.   Dental advisory given  Plan Discussed with: CRNA and Anesthesiologist  Anesthesia Plan Comments:         Anesthesia Quick Evaluation

## 2016-11-25 ENCOUNTER — Encounter: Payer: Self-pay | Admitting: Surgery

## 2016-11-25 LAB — CBC
HCT: 36 % (ref 35.0–47.0)
Hemoglobin: 12.5 g/dL (ref 12.0–16.0)
MCH: 31.7 pg (ref 26.0–34.0)
MCHC: 34.7 g/dL (ref 32.0–36.0)
MCV: 91.2 fL (ref 80.0–100.0)
PLATELETS: 290 10*3/uL (ref 150–440)
RBC: 3.94 MIL/uL (ref 3.80–5.20)
RDW: 13.1 % (ref 11.5–14.5)
WBC: 15.9 10*3/uL — AB (ref 3.6–11.0)

## 2016-11-25 LAB — BASIC METABOLIC PANEL
Anion gap: 7 (ref 5–15)
BUN: 11 mg/dL (ref 6–20)
CHLORIDE: 106 mmol/L (ref 101–111)
CO2: 27 mmol/L (ref 22–32)
CREATININE: 0.79 mg/dL (ref 0.44–1.00)
Calcium: 8.6 mg/dL — ABNORMAL LOW (ref 8.9–10.3)
GFR calc Af Amer: >60 mL/min (ref >60)
GFR calc non Af Amer: >60 mL/min (ref >60)
GLUCOSE: 154 mg/dL — AB (ref 65–99)
Potassium: 3.3 mmol/L — ABNORMAL LOW (ref 3.5–5.1)
SODIUM: 140 mmol/L (ref 135–145)

## 2016-11-25 LAB — MAGNESIUM: MAGNESIUM: 1.6 mg/dL — AB (ref 1.7–2.4)

## 2016-11-25 LAB — SURGICAL PATHOLOGY

## 2016-11-25 MED ORDER — HYDROCODONE-ACETAMINOPHEN 5-325 MG PO TABS
1.0000 | ORAL_TABLET | ORAL | Status: DC | PRN
  Administered 2016-11-25 – 2016-11-26 (×2): 1 via ORAL
  Filled 2016-11-25 (×2): qty 1

## 2016-11-25 MED ORDER — MAGNESIUM SULFATE 2 GM/50ML IV SOLN
2.0000 g | Freq: Once | INTRAVENOUS | Status: AC
  Administered 2016-11-25: 2 g via INTRAVENOUS
  Filled 2016-11-25: qty 50

## 2016-11-25 NOTE — Progress Notes (Addendum)
Verbal order from on-call surgeon to discontinue foley, pt is due to void by 2100 11/25/2016.   Taft Worthing [employer]

## 2016-11-25 NOTE — Progress Notes (Signed)
POD # 1  Doing Well AVSS Good UO Hb stable creat ok Some pain No flatus No Emesis, taking clears JP 80cc  PE NAD Abd: soft, incisions c/d/i. appropiate incisional tenderness No peritonitis , serosanguinous drainage from JP Ext: no edema, well perfused   A/p Doing well Keep on clears Keep Foley for now due to pelvic surgery, anticipate DC foley in am mobilize

## 2016-11-26 LAB — BASIC METABOLIC PANEL
Anion gap: 5 (ref 5–15)
BUN: 11 mg/dL (ref 6–20)
CALCIUM: 8.4 mg/dL — AB (ref 8.9–10.3)
CHLORIDE: 103 mmol/L (ref 101–111)
CO2: 27 mmol/L (ref 22–32)
CREATININE: 0.8 mg/dL (ref 0.44–1.00)
GFR calc non Af Amer: >60 mL/min (ref >60)
Glucose, Bld: 97 mg/dL (ref 65–99)
Potassium: 3 mmol/L — ABNORMAL LOW (ref 3.5–5.1)
SODIUM: 135 mmol/L (ref 135–145)

## 2016-11-26 LAB — MAGNESIUM: MAGNESIUM: 2.2 mg/dL (ref 1.7–2.4)

## 2016-11-26 MED ORDER — CYCLOBENZAPRINE HCL 10 MG PO TABS
10.0000 mg | ORAL_TABLET | Freq: Three times a day (TID) | ORAL | Status: DC
  Administered 2016-11-26 – 2016-11-28 (×7): 10 mg via ORAL
  Filled 2016-11-26 (×7): qty 1

## 2016-11-26 MED ORDER — POTASSIUM CHLORIDE 10 MEQ/100ML IV SOLN
10.0000 meq | INTRAVENOUS | Status: AC
  Administered 2016-11-26 (×3): 10 meq via INTRAVENOUS
  Filled 2016-11-26 (×3): qty 100

## 2016-11-26 MED ORDER — GABAPENTIN 400 MG PO CAPS
400.0000 mg | ORAL_CAPSULE | Freq: Three times a day (TID) | ORAL | Status: DC
  Administered 2016-11-26 – 2016-11-28 (×7): 400 mg via ORAL
  Filled 2016-11-26 (×7): qty 1

## 2016-11-26 MED ORDER — OXYCODONE HCL 5 MG PO TABS
10.0000 mg | ORAL_TABLET | ORAL | Status: DC | PRN
  Administered 2016-11-26 – 2016-11-28 (×3): 10 mg via ORAL
  Filled 2016-11-26 (×4): qty 2

## 2016-11-26 NOTE — Progress Notes (Signed)
11/26/2016 15:45  Entered pt room to give scheduled meds and noted skin tear to left arm that was not present upon assessment earlier in the day.  When asked, pt said she was unsure.  Denied pain or any knowledge of how it happened.  Stated "My skin is thing.  It's always bruising and tearing."  Pt denied pain at site and was not actively bleeding.  Cleaned the tear and applied large adhesive bandage at site.  Will continue to monitor and assess.  Dola Argyle, RN

## 2016-11-26 NOTE — Progress Notes (Signed)
POD # 2 Doing well JP 200cc No flatus Able to void AVSS Still some moderate pain Ambulated Good UO creat ok Low K  PE NAD Abd: soft, incision c/d/i, no infection JP serosanguinous drainage  A/p Doing well Path noted and d/w pt Start PO oxy and increase flexeril Mobilize Keep clears

## 2016-11-26 NOTE — Plan of Care (Signed)
Problem: Safety: Goal: Ability to remain free from injury will improve Outcome: Not Progressing Skin tear noted to left arm, not present on assessment this morning.  See afternoon reassment and progress note.  Dola Argyle, RN

## 2016-11-27 LAB — BASIC METABOLIC PANEL
Anion gap: 5 (ref 5–15)
BUN: 8 mg/dL (ref 6–20)
CALCIUM: 8.4 mg/dL — AB (ref 8.9–10.3)
CO2: 27 mmol/L (ref 22–32)
CREATININE: 0.7 mg/dL (ref 0.44–1.00)
Chloride: 108 mmol/L (ref 101–111)
GFR calc Af Amer: >60 mL/min (ref >60)
GLUCOSE: 88 mg/dL (ref 65–99)
Potassium: 3.1 mmol/L — ABNORMAL LOW (ref 3.5–5.1)
SODIUM: 140 mmol/L (ref 135–145)

## 2016-11-27 MED ORDER — OXYCODONE-ACETAMINOPHEN 7.5-325 MG PO TABS
1.0000 | ORAL_TABLET | ORAL | Status: DC | PRN
  Administered 2016-11-27: 1 via ORAL
  Filled 2016-11-27: qty 1

## 2016-11-27 NOTE — Progress Notes (Signed)
3 Days Post-Op  Subjective: Patient feels well but is having diarrhea and passing gas. She has no nausea vomiting and minimal abdominal pain  Objective: Vital signs in last 24 hours: Temp:  [97.5 F (36.4 C)-97.8 F (36.6 C)] 97.6 F (36.4 C) (08/31 0500) Pulse Rate:  [70-78] 78 (08/31 0500) Resp:  [16-18] 18 (08/30 2038) BP: (126-146)/(67-86) 146/86 (08/31 0500) SpO2:  [98 %-99 %] 98 % (08/31 0500) Last BM Date: 11/26/16  Intake/Output from previous day: 08/30 0701 - 08/31 0700 In: 2384.3 [P.O.:600; I.V.:1584.3; IV Piggyback:200] Out: 2300 [Urine:2200; Drains:100] Intake/Output this shift: No intake/output data recorded.  Physical exam:  Wounds are clean abdomen is soft and nontender slightly distended  Lab Results: CBC   Recent Labs  11/24/16 1357 11/25/16 0434  WBC 16.5* 15.9*  HGB 14.2 12.5  HCT 40.9 36.0  PLT 314 290   BMET  Recent Labs  11/26/16 0458 11/27/16 0339  NA 135 140  K 3.0* 3.1*  CL 103 108  CO2 27 27  GLUCOSE 97 88  BUN 11 8  CREATININE 0.80 0.70  CALCIUM 8.4* 8.4*   PT/INR No results for input(s): LABPROT, INR in the last 72 hours. ABG No results for input(s): PHART, HCO3 in the last 72 hours.  Invalid input(s): PCO2, PO2  Studies/Results: No results found.  Anti-infectives: Anti-infectives    Start     Dose/Rate Route Frequency Ordered Stop   11/24/16 0600  ertapenem (INVANZ) 1 g in sodium chloride 0.9 % 50 mL IVPB     1 g 100 mL/hr over 30 Minutes Intravenous  Once 11/24/16 0558 11/24/16 1583      Assessment/Plan: s/p Procedure(s): LAPAROSCOPIC LOW ANTERIOR RESECTION   Patient is doing well passing gas but she's having diarrhea will advance diet slowly.  Florene Glen, MD, FACS  11/27/2016

## 2016-11-27 NOTE — Progress Notes (Signed)
Doing well AVSS BM some blood Ambulated Tolerated fulls  PE NAD Abd: soft, Mild tenderness, no peritonitis. Incisions c/d/i. No infection JP serous fluid  A/P Doing well  May DC in 24-48 hrs  Advance diet heplock ivf

## 2016-11-28 LAB — CBC WITH DIFFERENTIAL/PLATELET
BASOS ABS: 0.1 10*3/uL (ref 0–0.1)
BASOS PCT: 1 %
EOS ABS: 0.7 10*3/uL (ref 0–0.7)
EOS PCT: 8 %
HCT: 39.3 % (ref 35.0–47.0)
Hemoglobin: 13.7 g/dL (ref 12.0–16.0)
Lymphocytes Relative: 17 %
Lymphs Abs: 1.7 10*3/uL (ref 1.0–3.6)
MCH: 31.9 pg (ref 26.0–34.0)
MCHC: 34.8 g/dL (ref 32.0–36.0)
MCV: 91.9 fL (ref 80.0–100.0)
MONO ABS: 0.6 10*3/uL (ref 0.2–0.9)
Monocytes Relative: 7 %
Neutro Abs: 6.7 10*3/uL — ABNORMAL HIGH (ref 1.4–6.5)
Neutrophils Relative %: 67 %
PLATELETS: 326 10*3/uL (ref 150–440)
RBC: 4.28 MIL/uL (ref 3.80–5.20)
RDW: 13.3 % (ref 11.5–14.5)
WBC: 9.8 10*3/uL (ref 3.6–11.0)

## 2016-11-28 MED ORDER — OXYCODONE-ACETAMINOPHEN 5-325 MG PO TABS: 1.0000 | ORAL_TABLET | Freq: Four times a day (QID) | ORAL | 0 refills | Status: DC | PRN

## 2016-11-28 NOTE — Progress Notes (Signed)
Per Dr. Burt Knack okay to place order for JP drain to be removed at bedside. Also okay for patient to go back to taking all home meds.

## 2016-11-28 NOTE — Progress Notes (Addendum)
Pt A and O x 4. VSS. Pt tolerating diet well. Minimal complaints of pain with meds given to control and no nausea. IV removed intact, prescriptions given. JP drain removed. Pt voiced understanding of discharge instructions with no further questions. Pt discharged via wheelchair with nurse.

## 2016-11-28 NOTE — Progress Notes (Signed)
4 Days Post-Op  Subjective: Status post colon resection for diverticulitis. Patient doing very well feels well and tolerating a diet and believe she is ready to go home  Objective: Vital signs in last 24 hours: Temp:  [97.6 F (36.4 C)-98.2 F (36.8 C)] 97.6 F (36.4 C) (09/01 0416) Pulse Rate:  [70-74] 72 (09/01 0416) Resp:  [18-20] 18 (09/01 0416) BP: (128-143)/(74-80) 128/80 (09/01 0416) SpO2:  [98 %-100 %] 99 % (09/01 0416) Last BM Date: 11/26/16  Intake/Output from previous day: 08/31 0701 - 09/01 0700 In: 813.8 [P.O.:720; I.V.:93.8] Out: 1225 [Urine:1200; Drains:25] Intake/Output this shift: No intake/output data recorded.  Physical exam:  Abdomen soft nontender wounds are clean no erythema no drainage  Lab Results: CBC   Recent Labs  11/28/16 0338  WBC 9.8  HGB 13.7  HCT 39.3  PLT 326   BMET  Recent Labs  11/26/16 0458 11/27/16 0339  NA 135 140  K 3.0* 3.1*  CL 103 108  CO2 27 27  GLUCOSE 97 88  BUN 11 8  CREATININE 0.80 0.70  CALCIUM 8.4* 8.4*   PT/INR No results for input(s): LABPROT, INR in the last 72 hours. ABG No results for input(s): PHART, HCO3 in the last 72 hours.  Invalid input(s): PCO2, PO2  Studies/Results: No results found.  Anti-infectives: Anti-infectives    Start     Dose/Rate Route Frequency Ordered Stop   11/24/16 0600  ertapenem (INVANZ) 1 g in sodium chloride 0.9 % 50 mL IVPB     1 g 100 mL/hr over 30 Minutes Intravenous  Once 11/24/16 0558 11/24/16 8341      Assessment/Plan: s/p Procedure(s): LAPAROSCOPIC LOW ANTERIOR RESECTION   Patient doing very well will be discharged today to follow up with Dr. Perrin Maltese in 10 days.  Florene Glen, MD, FACS  11/28/2016

## 2016-11-28 NOTE — Discharge Instructions (Signed)
Resume all home medications Take oral analgesics as needed Follow-up with Dr. Dahlia Byes in 10 days to 2 weeks May shower Regular diet

## 2016-11-28 NOTE — Discharge Summary (Signed)
Physician Discharge Summary  Patient ID: April Larsen MRN: [id number] DOB/AGE: 1946-03-21 71 y.o.  Admit date: 11/24/2016 Discharge date: 11/28/2016   Discharge Diagnoses:  Active Problems:   Diverticulitis   Procedures:Laparoscopic assisted sigmoid colon resection  Hospital Course: This patient who was admitted the hospital for elective sigmoid colon resection for diverticulitis. Dr. Dahlia Byes perform this with laparoscopic assistance. Patient made a non-, K to postoperative recovery and is discharged in stable condition with instructions to shower and resume all her home medications she will follow-up with Dr. Dahlia Byes in 10-14 days. She may shower  Consults: None  Disposition: 01-Home or Self Care   Allergies as of 11/28/2016      Reactions   Sulfa Antibiotics Rash      Medication List    TAKE these medications   CALCIUM 600+D PO Take 1 tablet by mouth 2 (two) times daily.   cholecalciferol 1000 units tablet Commonly known as:  VITAMIN D Take 2,000 Units by mouth daily.   cyclobenzaprine 10 MG tablet Commonly known as:  FLEXERIL Take 10 mg by mouth at bedtime.   DULoxetine 60 MG capsule Commonly known as:  CYMBALTA Take 60 mg by mouth at bedtime.   erythromycin base 500 MG tablet Commonly known as:  E-MYCIN Take 2 tablets (1,000 mg total) by mouth 3 (three) times daily.   ESTRACE VAGINAL 0.1 MG/GM vaginal cream Generic drug:  estradiol Place 1 application vaginally at bedtime.   ferrous sulfate 325 (65 FE) MG tablet Take 325 mg by mouth daily.   lidocaine 5 % Commonly known as:  LIDODERM Place 1 patch onto the skin daily as needed (pain).   neomycin 500 MG tablet Commonly known as:  MYCIFRADIN Take 2 tablets (1,000 mg total) by mouth 3 (three) times daily.   omeprazole 20 MG capsule Commonly known as:  PRILOSEC Take 20 mg by mouth 2 (two) times daily.   oxyCODONE-acetaminophen 5-325 MG tablet Commonly known as:  ROXICET Take 1 tablet by mouth every  6 (six) hours as needed.   saccharomyces boulardii 250 MG capsule Commonly known as:  FLORASTOR Take 250 mg by mouth daily.   sennosides-docusate sodium 8.6-50 MG tablet Commonly known as:  SENOKOT-S Take 2 tablets by mouth at bedtime.   TYLENOL 500 MG tablet Generic drug:  acetaminophen Take 1,000 mg by mouth every 6 (six) hours as needed for moderate pain or headache.            Discharge Care Instructions        Start     Ordered   11/28/16 0000  oxyCODONE-acetaminophen (ROXICET) 5-325 MG tablet  Every 6 hours PRN     11/28/16 8882     Follow-up Information    Florene Glen, MD Follow up in 10 day(s).   Specialty:  Surgery Contact information: [address] [place] [address] [phone]           Florene Glen, MD, FACS

## 2016-12-01 ENCOUNTER — Other Ambulatory Visit: Payer: Self-pay

## 2016-12-01 ENCOUNTER — Telehealth: Payer: Self-pay

## 2016-12-01 NOTE — Telephone Encounter (Signed)
Post-op call made to patient at this time. Spoke with April Larsen. Post-op interview questions below.  1. How are you feeling? Some sorness  2. Is your pain controlled? yes  3. What are you doing for the pain? Taking the oxycodone  4. Are you having any Nausea or Vomiting? None at thime  5. Are you having any Fever or Chills? None  6. Are you having any Constipation or Diarrhea? Haven't had a bowel movement advised try Miaralax 17 g and take as stated on the package  7. Is there any Swelling or Bruising you are concerned about? Having some swelling in feet. Advised to drink as much water as possible and try ice and heat packs alternative every 30 minutes.  8. Do you have any questions or concerns at this time?    Discussion: Scheduled follow up appointment at 11:30 on 9/12 with Dr. Adonis Huguenin

## 2016-12-09 ENCOUNTER — Encounter: Payer: Self-pay | Admitting: General Surgery

## 2016-12-10 ENCOUNTER — Ambulatory Visit (INDEPENDENT_AMBULATORY_CARE_PROVIDER_SITE_OTHER): Payer: PPO | Admitting: Surgery

## 2016-12-10 ENCOUNTER — Encounter: Payer: Self-pay | Admitting: Surgery

## 2016-12-10 VITALS — BP 127/76 | HR 106 | Temp 98.5°F | Ht 65.0 in | Wt 169.6 lb

## 2016-12-10 DIAGNOSIS — Z09 Encounter for follow-up examination after completed treatment for conditions other than malignant neoplasm: Secondary | ICD-10-CM

## 2016-12-10 NOTE — Patient Instructions (Signed)
We will see you back in 2-3 weeks. If you are feeling better, you may call and cancel this appointment. You are doing wonderful at this time. Call prior to your next appointment if you have any questions or concerns.

## 2016-12-11 NOTE — Progress Notes (Signed)
S/p lap LAR Did very well Path d/w pt Some mild int. abd pain Taking PO, and having BM Ambulating well  PE: NAD Abd: incisions c/d/i, no infection. No peritonitis  A/P Doing very well from colectomy RTC 3-4 weeks No surgical issues or complications at this time

## 2016-12-31 ENCOUNTER — Encounter: Payer: Self-pay | Admitting: Surgery

## 2016-12-31 ENCOUNTER — Ambulatory Visit (INDEPENDENT_AMBULATORY_CARE_PROVIDER_SITE_OTHER): Payer: PPO | Admitting: Surgery

## 2016-12-31 VITALS — BP 151/94 | HR 92 | Temp 97.9°F | Ht 65.0 in | Wt 167.4 lb

## 2016-12-31 DIAGNOSIS — Z09 Encounter for follow-up examination after completed treatment for conditions other than malignant neoplasm: Secondary | ICD-10-CM

## 2016-12-31 NOTE — Progress Notes (Signed)
S/p lap LAR Doing very well  Taking PO, and having BM Ambulating well  PE: NAD Abd: scars well healed, no infection. No peritonitis  A/P Doing very well from colectomy RTC prn No surgical issues or complications at this time

## 2016-12-31 NOTE — Patient Instructions (Signed)
Please call our office should you have any questions or concerns.

## 2017-02-16 DIAGNOSIS — G5701 Lesion of sciatic nerve, right lower limb: Secondary | ICD-10-CM | POA: Diagnosis not present

## 2017-02-23 DIAGNOSIS — Z8719 Personal history of other diseases of the digestive system: Secondary | ICD-10-CM | POA: Diagnosis not present

## 2017-03-10 DIAGNOSIS — Z85828 Personal history of other malignant neoplasm of skin: Secondary | ICD-10-CM | POA: Diagnosis not present

## 2017-03-10 DIAGNOSIS — D1800 Hemangioma unspecified site: Secondary | ICD-10-CM | POA: Diagnosis not present

## 2017-03-10 DIAGNOSIS — L570 Actinic keratosis: Secondary | ICD-10-CM | POA: Diagnosis not present

## 2017-03-10 DIAGNOSIS — L578 Other skin changes due to chronic exposure to nonionizing radiation: Secondary | ICD-10-CM | POA: Diagnosis not present

## 2017-03-10 DIAGNOSIS — D692 Other nonthrombocytopenic purpura: Secondary | ICD-10-CM | POA: Diagnosis not present

## 2017-03-10 DIAGNOSIS — L821 Other seborrheic keratosis: Secondary | ICD-10-CM | POA: Diagnosis not present

## 2017-03-10 DIAGNOSIS — L812 Freckles: Secondary | ICD-10-CM | POA: Diagnosis not present

## 2017-03-10 DIAGNOSIS — L820 Inflamed seborrheic keratosis: Secondary | ICD-10-CM | POA: Diagnosis not present

## 2017-03-10 DIAGNOSIS — Z1283 Encounter for screening for malignant neoplasm of skin: Secondary | ICD-10-CM | POA: Diagnosis not present

## 2017-03-10 DIAGNOSIS — L853 Xerosis cutis: Secondary | ICD-10-CM | POA: Diagnosis not present

## 2017-03-10 DIAGNOSIS — L309 Dermatitis, unspecified: Secondary | ICD-10-CM | POA: Diagnosis not present

## 2017-03-10 DIAGNOSIS — D225 Melanocytic nevi of trunk: Secondary | ICD-10-CM | POA: Diagnosis not present

## 2017-05-02 DIAGNOSIS — J019 Acute sinusitis, unspecified: Secondary | ICD-10-CM | POA: Diagnosis not present

## 2017-05-11 DIAGNOSIS — E039 Hypothyroidism, unspecified: Secondary | ICD-10-CM | POA: Diagnosis not present

## 2017-05-11 DIAGNOSIS — K5190 Ulcerative colitis, unspecified, without complications: Secondary | ICD-10-CM | POA: Diagnosis not present

## 2017-05-18 DIAGNOSIS — Z79899 Other long term (current) drug therapy: Secondary | ICD-10-CM | POA: Diagnosis not present

## 2017-05-18 DIAGNOSIS — K5732 Diverticulitis of large intestine without perforation or abscess without bleeding: Secondary | ICD-10-CM | POA: Insufficient documentation

## 2017-05-18 DIAGNOSIS — M5116 Intervertebral disc disorders with radiculopathy, lumbar region: Secondary | ICD-10-CM | POA: Diagnosis not present

## 2017-05-18 DIAGNOSIS — M4127 Other idiopathic scoliosis, lumbosacral region: Secondary | ICD-10-CM | POA: Diagnosis not present

## 2017-05-18 DIAGNOSIS — Z0000 Encounter for general adult medical examination without abnormal findings: Secondary | ICD-10-CM | POA: Diagnosis not present

## 2017-05-18 DIAGNOSIS — K5190 Ulcerative colitis, unspecified, without complications: Secondary | ICD-10-CM | POA: Diagnosis not present

## 2017-05-24 DIAGNOSIS — M79604 Pain in right leg: Secondary | ICD-10-CM | POA: Diagnosis not present

## 2017-05-24 DIAGNOSIS — Z9889 Other specified postprocedural states: Secondary | ICD-10-CM | POA: Diagnosis not present

## 2017-05-24 DIAGNOSIS — M5136 Other intervertebral disc degeneration, lumbar region: Secondary | ICD-10-CM | POA: Diagnosis not present

## 2017-05-24 DIAGNOSIS — M79605 Pain in left leg: Secondary | ICD-10-CM | POA: Diagnosis not present

## 2017-05-24 DIAGNOSIS — M47816 Spondylosis without myelopathy or radiculopathy, lumbar region: Secondary | ICD-10-CM | POA: Diagnosis not present

## 2017-05-24 DIAGNOSIS — M4126 Other idiopathic scoliosis, lumbar region: Secondary | ICD-10-CM | POA: Diagnosis not present

## 2017-05-24 DIAGNOSIS — M79672 Pain in left foot: Secondary | ICD-10-CM | POA: Diagnosis not present

## 2017-05-24 DIAGNOSIS — M545 Low back pain: Secondary | ICD-10-CM | POA: Diagnosis not present

## 2017-05-24 DIAGNOSIS — M79671 Pain in right foot: Secondary | ICD-10-CM | POA: Diagnosis not present

## 2017-06-02 DIAGNOSIS — G608 Other hereditary and idiopathic neuropathies: Secondary | ICD-10-CM | POA: Diagnosis not present

## 2017-06-09 DIAGNOSIS — M79671 Pain in right foot: Secondary | ICD-10-CM | POA: Diagnosis not present

## 2017-06-09 DIAGNOSIS — G608 Other hereditary and idiopathic neuropathies: Secondary | ICD-10-CM | POA: Diagnosis not present

## 2017-06-09 DIAGNOSIS — M4127 Other idiopathic scoliosis, lumbosacral region: Secondary | ICD-10-CM | POA: Diagnosis not present

## 2017-06-09 DIAGNOSIS — M79672 Pain in left foot: Secondary | ICD-10-CM | POA: Diagnosis not present

## 2017-06-09 DIAGNOSIS — M5136 Other intervertebral disc degeneration, lumbar region: Secondary | ICD-10-CM | POA: Diagnosis not present

## 2017-06-09 DIAGNOSIS — M79604 Pain in right leg: Secondary | ICD-10-CM | POA: Diagnosis not present

## 2017-06-09 DIAGNOSIS — Z9889 Other specified postprocedural states: Secondary | ICD-10-CM | POA: Diagnosis not present

## 2017-06-09 DIAGNOSIS — M4126 Other idiopathic scoliosis, lumbar region: Secondary | ICD-10-CM | POA: Diagnosis not present

## 2017-06-09 DIAGNOSIS — M79605 Pain in left leg: Secondary | ICD-10-CM | POA: Diagnosis not present

## 2017-06-15 DIAGNOSIS — M62830 Muscle spasm of back: Secondary | ICD-10-CM | POA: Diagnosis not present

## 2017-06-15 DIAGNOSIS — M5416 Radiculopathy, lumbar region: Secondary | ICD-10-CM | POA: Diagnosis not present

## 2017-06-15 DIAGNOSIS — M5136 Other intervertebral disc degeneration, lumbar region: Secondary | ICD-10-CM | POA: Diagnosis not present

## 2017-06-15 DIAGNOSIS — M419 Scoliosis, unspecified: Secondary | ICD-10-CM | POA: Diagnosis not present

## 2017-07-12 DIAGNOSIS — M419 Scoliosis, unspecified: Secondary | ICD-10-CM | POA: Diagnosis not present

## 2017-07-19 DIAGNOSIS — M419 Scoliosis, unspecified: Secondary | ICD-10-CM | POA: Diagnosis not present

## 2017-07-21 DIAGNOSIS — M419 Scoliosis, unspecified: Secondary | ICD-10-CM | POA: Diagnosis not present

## 2017-07-27 DIAGNOSIS — M419 Scoliosis, unspecified: Secondary | ICD-10-CM | POA: Diagnosis not present

## 2017-08-03 DIAGNOSIS — M419 Scoliosis, unspecified: Secondary | ICD-10-CM | POA: Diagnosis not present

## 2017-08-10 DIAGNOSIS — M419 Scoliosis, unspecified: Secondary | ICD-10-CM | POA: Diagnosis not present

## 2017-08-17 DIAGNOSIS — M419 Scoliosis, unspecified: Secondary | ICD-10-CM | POA: Diagnosis not present

## 2017-08-25 DIAGNOSIS — M419 Scoliosis, unspecified: Secondary | ICD-10-CM | POA: Diagnosis not present

## 2017-08-30 DIAGNOSIS — M41127 Adolescent idiopathic scoliosis, lumbosacral region: Secondary | ICD-10-CM | POA: Diagnosis not present

## 2017-08-30 DIAGNOSIS — M419 Scoliosis, unspecified: Secondary | ICD-10-CM | POA: Diagnosis not present

## 2017-09-08 DIAGNOSIS — M419 Scoliosis, unspecified: Secondary | ICD-10-CM | POA: Diagnosis not present

## 2017-09-22 DIAGNOSIS — M419 Scoliosis, unspecified: Secondary | ICD-10-CM | POA: Diagnosis not present

## 2017-10-06 DIAGNOSIS — M419 Scoliosis, unspecified: Secondary | ICD-10-CM | POA: Diagnosis not present

## 2017-11-01 DIAGNOSIS — H43813 Vitreous degeneration, bilateral: Secondary | ICD-10-CM | POA: Diagnosis not present

## 2017-11-12 DIAGNOSIS — Z79899 Other long term (current) drug therapy: Secondary | ICD-10-CM | POA: Diagnosis not present

## 2017-11-19 DIAGNOSIS — Z0000 Encounter for general adult medical examination without abnormal findings: Secondary | ICD-10-CM | POA: Diagnosis not present

## 2017-11-19 DIAGNOSIS — M4127 Other idiopathic scoliosis, lumbosacral region: Secondary | ICD-10-CM | POA: Diagnosis not present

## 2017-11-19 DIAGNOSIS — E782 Mixed hyperlipidemia: Secondary | ICD-10-CM | POA: Insufficient documentation

## 2017-11-23 ENCOUNTER — Other Ambulatory Visit: Payer: Self-pay | Admitting: Internal Medicine

## 2017-11-23 DIAGNOSIS — Z1231 Encounter for screening mammogram for malignant neoplasm of breast: Secondary | ICD-10-CM

## 2017-12-28 ENCOUNTER — Ambulatory Visit
Admission: RE | Admit: 2017-12-28 | Discharge: 2017-12-28 | Disposition: A | Discharge status: Home / Self Care | Payer: PPO | Source: Ambulatory Visit | Attending: Internal Medicine | Admitting: Internal Medicine

## 2017-12-28 DIAGNOSIS — Z1231 Encounter for screening mammogram for malignant neoplasm of breast: Secondary | ICD-10-CM | POA: Insufficient documentation

## 2018-03-10 DIAGNOSIS — L812 Freckles: Secondary | ICD-10-CM | POA: Diagnosis not present

## 2018-03-10 DIAGNOSIS — L720 Epidermal cyst: Secondary | ICD-10-CM | POA: Diagnosis not present

## 2018-03-10 DIAGNOSIS — Z85828 Personal history of other malignant neoplasm of skin: Secondary | ICD-10-CM | POA: Diagnosis not present

## 2018-03-10 DIAGNOSIS — D485 Neoplasm of uncertain behavior of skin: Secondary | ICD-10-CM | POA: Diagnosis not present

## 2018-03-10 DIAGNOSIS — D0472 Carcinoma in situ of skin of left lower limb, including hip: Secondary | ICD-10-CM | POA: Diagnosis not present

## 2018-03-10 DIAGNOSIS — L570 Actinic keratosis: Secondary | ICD-10-CM | POA: Diagnosis not present

## 2018-03-10 DIAGNOSIS — Z1283 Encounter for screening for malignant neoplasm of skin: Secondary | ICD-10-CM | POA: Diagnosis not present

## 2018-03-10 DIAGNOSIS — L578 Other skin changes due to chronic exposure to nonionizing radiation: Secondary | ICD-10-CM | POA: Diagnosis not present

## 2018-03-10 DIAGNOSIS — L821 Other seborrheic keratosis: Secondary | ICD-10-CM | POA: Diagnosis not present

## 2018-03-30 DIAGNOSIS — J1282 Pneumonia due to coronavirus disease 2019: Secondary | ICD-10-CM

## 2018-03-30 DIAGNOSIS — U071 COVID-19: Secondary | ICD-10-CM

## 2018-03-30 HISTORY — DX: Pneumonia due to coronavirus disease 2019: J12.82

## 2018-03-30 HISTORY — DX: COVID-19: U07.1

## 2018-04-21 DIAGNOSIS — L578 Other skin changes due to chronic exposure to nonionizing radiation: Secondary | ICD-10-CM | POA: Diagnosis not present

## 2018-04-21 DIAGNOSIS — L570 Actinic keratosis: Secondary | ICD-10-CM | POA: Diagnosis not present

## 2018-04-21 DIAGNOSIS — Z85828 Personal history of other malignant neoplasm of skin: Secondary | ICD-10-CM | POA: Diagnosis not present

## 2018-06-13 DIAGNOSIS — L570 Actinic keratosis: Secondary | ICD-10-CM | POA: Diagnosis not present

## 2018-06-13 DIAGNOSIS — L578 Other skin changes due to chronic exposure to nonionizing radiation: Secondary | ICD-10-CM | POA: Diagnosis not present

## 2018-07-07 DIAGNOSIS — G629 Polyneuropathy, unspecified: Secondary | ICD-10-CM | POA: Diagnosis not present

## 2018-07-07 DIAGNOSIS — E11610 Type 2 diabetes mellitus with diabetic neuropathic arthropathy: Secondary | ICD-10-CM | POA: Diagnosis not present

## 2018-07-07 DIAGNOSIS — Z0000 Encounter for general adult medical examination without abnormal findings: Secondary | ICD-10-CM | POA: Diagnosis not present

## 2018-07-07 DIAGNOSIS — M65342 Trigger finger, left ring finger: Secondary | ICD-10-CM | POA: Diagnosis not present

## 2018-07-07 DIAGNOSIS — D509 Iron deficiency anemia, unspecified: Secondary | ICD-10-CM | POA: Diagnosis not present

## 2018-07-07 DIAGNOSIS — E669 Obesity, unspecified: Secondary | ICD-10-CM | POA: Diagnosis not present

## 2018-07-07 DIAGNOSIS — K279 Peptic ulcer, site unspecified, unspecified as acute or chronic, without hemorrhage or perforation: Secondary | ICD-10-CM | POA: Diagnosis not present

## 2018-07-07 DIAGNOSIS — Z23 Encounter for immunization: Secondary | ICD-10-CM | POA: Diagnosis not present

## 2018-07-07 DIAGNOSIS — C50919 Malignant neoplasm of unspecified site of unspecified female breast: Secondary | ICD-10-CM | POA: Diagnosis not present

## 2018-07-07 DIAGNOSIS — E1149 Type 2 diabetes mellitus with other diabetic neurological complication: Secondary | ICD-10-CM | POA: Diagnosis not present

## 2018-07-07 DIAGNOSIS — Z1159 Encounter for screening for other viral diseases: Secondary | ICD-10-CM | POA: Diagnosis not present

## 2018-07-07 DIAGNOSIS — I10 Essential (primary) hypertension: Secondary | ICD-10-CM | POA: Diagnosis not present

## 2018-07-07 DIAGNOSIS — E7800 Pure hypercholesterolemia, unspecified: Secondary | ICD-10-CM | POA: Diagnosis not present

## 2018-07-07 DIAGNOSIS — M65331 Trigger finger, right middle finger: Secondary | ICD-10-CM | POA: Diagnosis not present

## 2018-09-16 DIAGNOSIS — M65331 Trigger finger, right middle finger: Secondary | ICD-10-CM | POA: Diagnosis not present

## 2018-09-16 DIAGNOSIS — M65342 Trigger finger, left ring finger: Secondary | ICD-10-CM | POA: Diagnosis not present

## 2018-10-26 DIAGNOSIS — M4127 Other idiopathic scoliosis, lumbosacral region: Secondary | ICD-10-CM | POA: Diagnosis not present

## 2018-10-26 DIAGNOSIS — M542 Cervicalgia: Secondary | ICD-10-CM | POA: Diagnosis not present

## 2018-12-01 DIAGNOSIS — K5190 Ulcerative colitis, unspecified, without complications: Secondary | ICD-10-CM | POA: Diagnosis not present

## 2018-12-01 DIAGNOSIS — E782 Mixed hyperlipidemia: Secondary | ICD-10-CM | POA: Diagnosis not present

## 2018-12-08 DIAGNOSIS — K5190 Ulcerative colitis, unspecified, without complications: Secondary | ICD-10-CM | POA: Diagnosis not present

## 2018-12-08 DIAGNOSIS — F330 Major depressive disorder, recurrent, mild: Secondary | ICD-10-CM | POA: Diagnosis not present

## 2018-12-08 DIAGNOSIS — M8588 Other specified disorders of bone density and structure, other site: Secondary | ICD-10-CM | POA: Diagnosis not present

## 2018-12-08 DIAGNOSIS — Z780 Asymptomatic menopausal state: Secondary | ICD-10-CM | POA: Diagnosis not present

## 2018-12-08 DIAGNOSIS — Z0000 Encounter for general adult medical examination without abnormal findings: Secondary | ICD-10-CM | POA: Diagnosis not present

## 2018-12-08 DIAGNOSIS — M542 Cervicalgia: Secondary | ICD-10-CM | POA: Diagnosis not present

## 2018-12-08 DIAGNOSIS — M41127 Adolescent idiopathic scoliosis, lumbosacral region: Secondary | ICD-10-CM | POA: Diagnosis not present

## 2018-12-08 DIAGNOSIS — E782 Mixed hyperlipidemia: Secondary | ICD-10-CM | POA: Diagnosis not present

## 2018-12-14 ENCOUNTER — Other Ambulatory Visit: Payer: Self-pay | Admitting: Internal Medicine

## 2018-12-14 DIAGNOSIS — Z1231 Encounter for screening mammogram for malignant neoplasm of breast: Secondary | ICD-10-CM

## 2018-12-27 DIAGNOSIS — Z20828 Contact with and (suspected) exposure to other viral communicable diseases: Secondary | ICD-10-CM | POA: Diagnosis not present

## 2019-01-08 ENCOUNTER — Emergency Department: Payer: PPO

## 2019-01-08 ENCOUNTER — Other Ambulatory Visit: Payer: Self-pay

## 2019-01-08 ENCOUNTER — Encounter: Payer: Self-pay | Admitting: Emergency Medicine

## 2019-01-08 ENCOUNTER — Emergency Department
Admission: EM | Admit: 2019-01-08 | Discharge: 2019-01-09 | Disposition: A | Discharge status: Other Acute Inpatient Hospital | Payer: PPO | Attending: Emergency Medicine | Admitting: Emergency Medicine

## 2019-01-08 DIAGNOSIS — R0902 Hypoxemia: Secondary | ICD-10-CM | POA: Diagnosis not present

## 2019-01-08 DIAGNOSIS — J189 Pneumonia, unspecified organism: Secondary | ICD-10-CM | POA: Insufficient documentation

## 2019-01-08 DIAGNOSIS — J129 Viral pneumonia, unspecified: Secondary | ICD-10-CM | POA: Diagnosis not present

## 2019-01-08 DIAGNOSIS — U071 COVID-19: Secondary | ICD-10-CM | POA: Diagnosis not present

## 2019-01-08 DIAGNOSIS — R0602 Shortness of breath: Secondary | ICD-10-CM | POA: Diagnosis not present

## 2019-01-08 DIAGNOSIS — Z85828 Personal history of other malignant neoplasm of skin: Secondary | ICD-10-CM | POA: Insufficient documentation

## 2019-01-08 DIAGNOSIS — J1289 Other viral pneumonia: Secondary | ICD-10-CM | POA: Diagnosis not present

## 2019-01-08 DIAGNOSIS — K219 Gastro-esophageal reflux disease without esophagitis: Secondary | ICD-10-CM

## 2019-01-08 DIAGNOSIS — J9601 Acute respiratory failure with hypoxia: Secondary | ICD-10-CM | POA: Diagnosis not present

## 2019-01-08 DIAGNOSIS — R05 Cough: Secondary | ICD-10-CM | POA: Diagnosis not present

## 2019-01-08 LAB — CBC WITH DIFFERENTIAL/PLATELET
Abs Immature Granulocytes: 0.1 10*3/uL — ABNORMAL HIGH (ref 0.00–0.07)
Basophils Absolute: 0 10*3/uL (ref 0.0–0.1)
Basophils Relative: 0 %
Eosinophils Absolute: 0 10*3/uL (ref 0.0–0.5)
Eosinophils Relative: 0 %
HCT: 44.7 % (ref 36.0–46.0)
Hemoglobin: 15.4 g/dL — ABNORMAL HIGH (ref 12.0–15.0)
Immature Granulocytes: 1 %
Lymphocytes Relative: 3 %
Lymphs Abs: 0.4 10*3/uL — ABNORMAL LOW (ref 0.7–4.0)
MCH: 30.9 pg (ref 26.0–34.0)
MCHC: 34.5 g/dL (ref 30.0–36.0)
MCV: 89.6 fL (ref 80.0–100.0)
Monocytes Absolute: 0.5 10*3/uL (ref 0.1–1.0)
Monocytes Relative: 4 %
Neutro Abs: 11.4 10*3/uL — ABNORMAL HIGH (ref 1.7–7.7)
Neutrophils Relative %: 92 %
Platelets: 355 10*3/uL (ref 150–400)
RBC: 4.99 MIL/uL (ref 3.87–5.11)
RDW: 13.1 % (ref 11.5–15.5)
WBC: 12.5 10*3/uL — ABNORMAL HIGH (ref 4.0–10.5)
nRBC: 0 % (ref 0.0–0.2)

## 2019-01-08 LAB — COMPREHENSIVE METABOLIC PANEL
ALT: 41 U/L (ref 0–44)
AST: 59 U/L — ABNORMAL HIGH (ref 15–41)
Albumin: 3.1 g/dL — ABNORMAL LOW (ref 3.5–5.0)
Alkaline Phosphatase: 84 U/L (ref 38–126)
Anion gap: 12 (ref 5–15)
BUN: 26 mg/dL — ABNORMAL HIGH (ref 8–23)
CO2: 22 mmol/L (ref 22–32)
Calcium: 9.2 mg/dL (ref 8.9–10.3)
Chloride: 100 mmol/L (ref 98–111)
Creatinine, Ser: 0.89 mg/dL (ref 0.44–1.00)
GFR calc Af Amer: >60 mL/min (ref >60)
GFR calc non Af Amer: >60 mL/min (ref >60)
Glucose, Bld: 120 mg/dL — ABNORMAL HIGH (ref 70–99)
Potassium: 3.4 mmol/L — ABNORMAL LOW (ref 3.5–5.1)
Sodium: 134 mmol/L — ABNORMAL LOW (ref 135–145)
Total Bilirubin: 0.5 mg/dL (ref 0.3–1.2)
Total Protein: 7.3 g/dL (ref 6.5–8.1)

## 2019-01-08 LAB — TROPONIN I (HIGH SENSITIVITY): Troponin I (High Sensitivity): 5 ng/L (ref <18)

## 2019-01-08 LAB — FERRITIN: Ferritin: 721 ng/mL — ABNORMAL HIGH (ref 11–307)

## 2019-01-08 LAB — PROCALCITONIN: Procalcitonin: <0.1 ng/mL

## 2019-01-08 LAB — FIBRIN DERIVATIVES D-DIMER (ARMC ONLY): Fibrin derivatives D-dimer (ARMC): 1052.98 ng/mL (FEU) — ABNORMAL HIGH (ref 0.00–499.00)

## 2019-01-08 MED ORDER — DEXAMETHASONE SODIUM PHOSPHATE 10 MG/ML IJ SOLN
10.0000 mg | Freq: Once | INTRAMUSCULAR | Status: AC
  Administered 2019-01-08: 20:00:00 10 mg via INTRAVENOUS
  Filled 2019-01-08: qty 1

## 2019-01-08 NOTE — ED Triage Notes (Signed)
Pt arrived via [place] with reports of COVID positive on 9/29.  Pt states granddaughter had COVID and brother is COVID positive.  Pt states she also recently just buried her mother.  PT states "I'm deathly ill", pt c/o shortness of breath, cough and weakness along with decreased appetite.

## 2019-01-08 NOTE — ED Triage Notes (Signed)
First Nurse Note:  Patient diagnosed COVID + 9/29.  Arrives today stating not improving.  Continues to feel dizzy.  Not improving.  Patient is AAOx3.  Skin warm and dry. No SOB/ DOE.  NAD

## 2019-01-08 NOTE — ED Provider Notes (Signed)
[facility] Emergency Department Provider Note   ____________________________________________   First MD Initiated Contact with Patient 01/08/19 1841     (approximate)  I have reviewed the triage vital signs and the nursing notes.   HISTORY  Chief Complaint Shortness of Breath and COVID Positive    HPI April Larsen is a 73 y.o. female with past medical history listed below presents to the ED complaining of shortness of breath.  Patient reports that she was initially diagnosed with COVID-19 on September 29 through her PCPs office.  Since then, she has had gradually worsening shortness of breath and lightheadedness, which is now gotten to the point where she can no longer get around.  Is been associated with a dry cough and she was having fevers previously, but states these have resolved.  She denies any chest pain and has not had any pain or swelling in her legs.  She has been taking steroids and amoxicillin prescribed by her PCP, but states she continues to feel worse and worse.        Past Medical History:  Diagnosis Date  . Anemia    LAST HGB 15.2 ON 04-22-16  . Anxiety   . Arthritis   . Cancer ([place]) 2016   skin  . Cataract (lens) fragments in eye following cataract surgery, bilateral 2013   one eye done then 4 weeks later the other eye done.  . Chronic ulcerative colitis, without complications ([place]) 12/04/2834  . Diverticulitis   . Esophagitis, reflux 10/04/2014  . Family history of adverse reaction to anesthesia    SISTER HARD TO WAKE UP and nausea and vomiting.  . Female stress incontinence 08/09/2013  . Gastroesophageal reflux disease without esophagitis 08/06/2016  . GERD (gastroesophageal reflux disease)   . Headache    MIGRAINE  . History of hiatal hernia   . Incomplete emptying of bladder 10/29/2013  . Migraine 07/26/2013  . PONV (postoperative nausea and vomiting)    nausea, does not remember vomiting.  Marland Kitchen Postherpetic neuralgia    RIGHT 7TH.CRANIAL NERVE from shingles followed by Bell's palsy  . Postmenopausal 11/09/2016  . PVC (premature ventricular contraction)   . Scoliosis   . Scoliosis (and kyphoscoliosis), idiopathic 07/26/2013  . Scoliosis of lumbosacral spine 01/18/2015  . Trochanteric bursitis 11/29/2014  . Ulcerative colitis ([place])   . Urethral prolapse 08/09/2013  . Urge incontinence 08/09/2013    Patient Active Problem List   Diagnosis Date Noted  . Postmenopausal 11/09/2016  . Diverticulitis 08/06/2016  . Gastroesophageal reflux disease without esophagitis 08/06/2016  . Chronic ulcerative colitis, without complications ([place]) 62/94/7654  . Scoliosis of lumbosacral spine 01/18/2015  . Trochanteric bursitis 11/29/2014  . Esophagitis, reflux 10/04/2014  . Incomplete emptying of bladder 10/29/2013  . Female stress incontinence 08/09/2013  . Urethral prolapse 08/09/2013  . Urge incontinence 08/09/2013  . Migraine 07/26/2013  . Scoliosis (and kyphoscoliosis), idiopathic 07/26/2013  . Ulcerative colitis ([place]) 07/26/2013    Past Surgical History:  Procedure Laterality Date  . ABDOMINAL HYSTERECTOMY    . BACK SURGERY     L3-L4 AND L3-S1 DECOMPRESSION  . CARPAL TUNNEL RELEASE    . CHOLECYSTECTOMY N/A 08/21/2016   Procedure: LAPAROSCOPIC CHOLECYSTECTOMY WITH INTRAOPERATIVE CHOLANGIOGRAM;  Surgeon: Leonie Green, MD;  Location: [facility] ORS;  Service: General;  Laterality: N/A;  . COLONOSCOPY  11/06/2005  . COLONOSCOPY WITH PROPOFOL N/A 07/29/2016   Procedure: COLONOSCOPY WITH PROPOFOL;  Surgeon: Manya Silvas, MD;  Location: [facility] ENDOSCOPY;  Service: Endoscopy;  Laterality: N/A;  . ESOPHAGOGASTRODUODENOSCOPY  11/06/2005  . ESOPHAGOGASTRODUODENOSCOPY (EGD) WITH PROPOFOL N/A 07/29/2016   Procedure: ESOPHAGOGASTRODUODENOSCOPY (EGD) WITH PROPOFOL;  Surgeon: Manya Silvas, MD;  Location: [facility] ENDOSCOPY;  Service: Endoscopy;  Laterality: N/A;  . LAPAROSCOPIC LOW ANTERIOR RESECTION  11/24/2016   Procedure:  LAPAROSCOPIC LOW ANTERIOR RESECTION;  Surgeon: Jules Husbands, MD;  Location: [facility] ORS;  Service: General;;    Prior to Admission medications   Medication Sig Start Date End Date Taking? Authorizing Provider  acetaminophen (TYLENOL) 500 MG tablet Take 1,000 mg by mouth every 6 (six) hours as needed for moderate pain or headache.     [provider]  Calcium Carbonate-Vitamin D (CALCIUM 600+D PO) Take 1 tablet by mouth 2 (two) times daily.    [provider]  cholecalciferol (VITAMIN D) 1000 units tablet Take 2,000 Units by mouth daily.    [provider]  DULoxetine (CYMBALTA) 60 MG capsule Take 60 mg by mouth at bedtime.  06/17/15   [provider]  ESTRACE VAGINAL 0.1 MG/GM vaginal cream Place 1 application vaginally at bedtime.  06/20/15   [provider]  ferrous sulfate 325 (65 FE) MG tablet Take 325 mg by mouth daily.     [provider]  lidocaine (LIDODERM) 5 % Place 1 patch onto the skin daily as needed (pain).  05/28/15   [provider]  omeprazole (PRILOSEC) 20 MG capsule Take 20 mg by mouth 2 (two) times daily. 07/29/15   [provider]  PREVNAR 13 SUSP injection ADM 0.5ML IM UTD 12/21/16   [provider]  saccharomyces boulardii (FLORASTOR) 250 MG capsule Take 250 mg by mouth daily.    [provider]  sennosides-docusate sodium (SENOKOT-S) 8.6-50 MG tablet Take 2 tablets by mouth at bedtime.    [provider]  [facility] injection ADM 0.5ML IM UTD 12/21/16   [provider]    Allergies Sulfa antibiotics  Family History  Problem Relation Age of Onset  . Breast cancer Sister 80  . Breast cancer Paternal Aunt 44  . Breast cancer Cousin        1st paternal cousin    Social History Social History   Tobacco Use  . Smoking status: Never Smoker  . Smokeless tobacco: Never Used  Substance Use Topics  . Alcohol use: No  . Drug use: No    Review of Systems   Constitutional: No fever/chills.  Positive for lightheadedness. Eyes: No visual changes. ENT: No sore throat. Cardiovascular: Denies chest pain. Respiratory: Positive for cough and shortness of breath. Gastrointestinal: No abdominal pain.  No nausea, no vomiting.  No diarrhea.  No constipation. Genitourinary: Negative for dysuria. Musculoskeletal: Negative for back pain. Skin: Negative for rash. Neurological: Negative for headaches, focal weakness or numbness.  ____________________________________________   PHYSICAL EXAM:  VITAL SIGNS: ED Triage Vitals  Enc Vitals Group     BP      Pulse      Resp      Temp      Temp src      SpO2      Weight      Height      Head Circumference      Peak Flow      Pain Score      Pain Loc      Pain Edu?      Excl. in [place]?     Constitutional: Alert and oriented. Eyes: Conjunctivae are normal. Head: Atraumatic. Nose: No  congestion/rhinnorhea. Mouth/Throat: Mucous membranes are moist. Neck: Normal ROM Cardiovascular: Normal rate, regular rhythm. Grossly normal heart sounds. Respiratory: Tachypneic with no respiratory distress.  No retractions.  Crackles bilaterally. Gastrointestinal: Soft and nontender. No distention. Genitourinary: deferred Musculoskeletal: No lower extremity tenderness nor edema. Neurologic:  Normal speech and language. No gross focal neurologic deficits are appreciated. Skin:  Skin is warm, dry and intact. No rash noted. Psychiatric: Mood and affect are normal. Speech and behavior are normal.  ____________________________________________   LABS (all labs ordered are listed, but only abnormal results are displayed)  Labs Reviewed  COMPREHENSIVE METABOLIC PANEL - Abnormal; Notable for the following components:      Result Value   Sodium 134 (*)    Potassium 3.4 (*)    Glucose, Bld 120 (*)    BUN 26 (*)    Albumin 3.1 (*)    AST 59 (*)    All other components within normal limits  CBC WITH  DIFFERENTIAL/PLATELET - Abnormal; Notable for the following components:   WBC 12.5 (*)    Hemoglobin 15.4 (*)    Neutro Abs 11.4 (*)    Lymphs Abs 0.4 (*)    Abs Immature Granulocytes 0.10 (*)    All other components within normal limits  FERRITIN - Abnormal; Notable for the following components:   Ferritin 721 (*)    All other components within normal limits  FIBRIN DERIVATIVES D-DIMER ([facility] ONLY) - Abnormal; Notable for the following components:   Fibrin derivatives D-dimer ([facility]) 1,052.98 (*)    All other components within normal limits  PROCALCITONIN  C-REACTIVE PROTEIN  TROPONIN I (HIGH SENSITIVITY)  TROPONIN I (HIGH SENSITIVITY)   ____________________________________________  EKG  ED ECG REPORT I, Blake Divine, the attending physician, personally viewed and interpreted this ECG.   Date: 01/08/2019  EKG Time: 18:58  Rate: 90  Rhythm: normal sinus rhythm  Axis: Normal  Intervals:none  ST&T Change: None    PROCEDURES  Procedure(s) performed (including Critical Care):  .Critical Care Performed by: Blake Divine, MD Authorized by: Blake Divine, MD   Critical care provider statement:    Critical care time (minutes):  45   Critical care time was exclusive of:  Separately billable procedures and treating other patients and teaching time   Critical care was necessary to treat or prevent imminent or life-threatening deterioration of the following conditions:  Respiratory failure   Critical care was time spent personally by me on the following activities:  Discussions with consultants, evaluation of patient's response to treatment, examination of patient, ordering and performing treatments and interventions, ordering and review of laboratory studies, ordering and review of radiographic studies, pulse oximetry, re-evaluation of patient's condition, obtaining history from patient or surrogate and review of old charts   I assumed direction of critical care for this  patient from another provider in my specialty: no       ____________________________________________   INITIAL IMPRESSION / ASSESSMENT AND PLAN / ED COURSE       73 year old female presenting to the ED for worsening lightheadedness and shortness of breath after being diagnosed with COVID-19 approximately 2 weeks ago.  She is in no respiratory distress but noted to have room air sat of 88%, subsequently placed on 4 L nasal cannula with improvement to 95%.  Will check chest x-ray and labs, treat with steroids given her O2 requirement.  She will eventually require admission to [facility] for further management.  Chest x-ray consistent with COVID pneumonia, procalcitonin undetectable, doubt bacterial pneumonia.  Remainder of labs unremarkable, troponin within normal limits.  Case discussed with Dr. Myna Hidalgo at [facility], who accepts patient for transfer, she remained stable on 4 L nasal cannula at this time.      ____________________________________________   FINAL CLINICAL IMPRESSION(S) / ED DIAGNOSES  Final diagnoses:  COVID-19  Viral pneumonia  Hypoxia     ED Discharge Orders    None       Note:  This document was prepared using Dragon voice recognition software and may include unintentional dictation errors.   Blake Divine, MD 01/08/19 [phone]

## 2019-01-08 NOTE — ED Notes (Signed)
Pt updated on status of bed at [facility]. Pt aware it may be overnight before pt gets bed at facility. Pt verbalized understanding.

## 2019-01-09 ENCOUNTER — Encounter (HOSPITAL_COMMUNITY): Payer: Self-pay | Admitting: Family Medicine

## 2019-01-09 ENCOUNTER — Inpatient Hospital Stay (HOSPITAL_COMMUNITY)
Admission: AD | Admit: 2019-01-09 | Discharge: 2019-01-14 | DRG: 177 | Disposition: A | Discharge status: Home / Self Care | Payer: PPO | Source: Other Acute Inpatient Hospital | Attending: Internal Medicine | Admitting: Internal Medicine

## 2019-01-09 DIAGNOSIS — Z780 Asymptomatic menopausal state: Secondary | ICD-10-CM | POA: Diagnosis not present

## 2019-01-09 DIAGNOSIS — D72828 Other elevated white blood cell count: Secondary | ICD-10-CM | POA: Diagnosis present

## 2019-01-09 DIAGNOSIS — B0229 Other postherpetic nervous system involvement: Secondary | ICD-10-CM | POA: Diagnosis not present

## 2019-01-09 DIAGNOSIS — E871 Hypo-osmolality and hyponatremia: Secondary | ICD-10-CM | POA: Diagnosis not present

## 2019-01-09 DIAGNOSIS — Z90710 Acquired absence of both cervix and uterus: Secondary | ICD-10-CM | POA: Diagnosis not present

## 2019-01-09 DIAGNOSIS — I469 Cardiac arrest, cause unspecified: Secondary | ICD-10-CM | POA: Diagnosis not present

## 2019-01-09 DIAGNOSIS — Z9049 Acquired absence of other specified parts of digestive tract: Secondary | ICD-10-CM | POA: Diagnosis not present

## 2019-01-09 DIAGNOSIS — E876 Hypokalemia: Secondary | ICD-10-CM | POA: Diagnosis present

## 2019-01-09 DIAGNOSIS — J9601 Acute respiratory failure with hypoxia: Secondary | ICD-10-CM | POA: Diagnosis present

## 2019-01-09 DIAGNOSIS — T380X5A Adverse effect of glucocorticoids and synthetic analogues, initial encounter: Secondary | ICD-10-CM | POA: Diagnosis present

## 2019-01-09 DIAGNOSIS — E861 Hypovolemia: Secondary | ICD-10-CM | POA: Diagnosis present

## 2019-01-09 DIAGNOSIS — F419 Anxiety disorder, unspecified: Secondary | ICD-10-CM | POA: Diagnosis not present

## 2019-01-09 DIAGNOSIS — Z8719 Personal history of other diseases of the digestive system: Secondary | ICD-10-CM | POA: Diagnosis not present

## 2019-01-09 DIAGNOSIS — J1289 Other viral pneumonia: Secondary | ICD-10-CM | POA: Diagnosis not present

## 2019-01-09 DIAGNOSIS — K449 Diaphragmatic hernia without obstruction or gangrene: Secondary | ICD-10-CM | POA: Diagnosis not present

## 2019-01-09 DIAGNOSIS — J1282 Pneumonia due to coronavirus disease 2019: Secondary | ICD-10-CM

## 2019-01-09 DIAGNOSIS — M1990 Unspecified osteoarthritis, unspecified site: Secondary | ICD-10-CM | POA: Diagnosis not present

## 2019-01-09 DIAGNOSIS — K219 Gastro-esophageal reflux disease without esophagitis: Secondary | ICD-10-CM | POA: Diagnosis not present

## 2019-01-09 DIAGNOSIS — K5900 Constipation, unspecified: Secondary | ICD-10-CM | POA: Diagnosis not present

## 2019-01-09 DIAGNOSIS — K5190 Ulcerative colitis, unspecified, without complications: Secondary | ICD-10-CM | POA: Diagnosis not present

## 2019-01-09 DIAGNOSIS — J9600 Acute respiratory failure, unspecified whether with hypoxia or hypercapnia: Secondary | ICD-10-CM | POA: Diagnosis not present

## 2019-01-09 DIAGNOSIS — M4127 Other idiopathic scoliosis, lumbosacral region: Secondary | ICD-10-CM | POA: Diagnosis present

## 2019-01-09 DIAGNOSIS — R279 Unspecified lack of coordination: Secondary | ICD-10-CM | POA: Diagnosis not present

## 2019-01-09 DIAGNOSIS — Z79890 Hormone replacement therapy (postmenopausal): Secondary | ICD-10-CM | POA: Diagnosis not present

## 2019-01-09 DIAGNOSIS — Z882 Allergy status to sulfonamides status: Secondary | ICD-10-CM | POA: Diagnosis not present

## 2019-01-09 DIAGNOSIS — R52 Pain, unspecified: Secondary | ICD-10-CM | POA: Diagnosis not present

## 2019-01-09 DIAGNOSIS — U071 COVID-19: Principal | ICD-10-CM | POA: Diagnosis present

## 2019-01-09 DIAGNOSIS — Z79899 Other long term (current) drug therapy: Secondary | ICD-10-CM | POA: Diagnosis not present

## 2019-01-09 DIAGNOSIS — Z743 Need for continuous supervision: Secondary | ICD-10-CM | POA: Diagnosis not present

## 2019-01-09 LAB — ABO/RH: ABO/RH(D): O POS

## 2019-01-09 LAB — C-REACTIVE PROTEIN: CRP: 12.4 mg/dL — ABNORMAL HIGH (ref <1.0)

## 2019-01-09 MED ORDER — LIDOCAINE 5 % EX PTCH
1.0000 | MEDICATED_PATCH | Freq: Every day | CUTANEOUS | Status: DC | PRN
  Filled 2019-01-09: qty 1

## 2019-01-09 MED ORDER — SENNOSIDES-DOCUSATE SODIUM 8.6-50 MG PO TABS
2.0000 | ORAL_TABLET | Freq: Every day | ORAL | Status: DC
  Administered 2019-01-09 – 2019-01-13 (×5): 2 via ORAL
  Filled 2019-01-09 (×5): qty 2

## 2019-01-09 MED ORDER — ACETAMINOPHEN 325 MG PO TABS
650.0000 mg | ORAL_TABLET | Freq: Four times a day (QID) | ORAL | Status: DC | PRN
  Administered 2019-01-09 – 2019-01-10 (×2): 650 mg via ORAL
  Filled 2019-01-09 (×2): qty 2

## 2019-01-09 MED ORDER — ENOXAPARIN SODIUM 40 MG/0.4ML ~~LOC~~ SOLN
40.0000 mg | Freq: Every day | SUBCUTANEOUS | Status: DC
  Administered 2019-01-09 – 2019-01-14 (×6): 40 mg via SUBCUTANEOUS
  Filled 2019-01-09 (×6): qty 0.4

## 2019-01-09 MED ORDER — POLYETHYLENE GLYCOL 3350 17 G PO PACK: 17.0000 g | PACK | Freq: Every day | ORAL | Status: DC | PRN

## 2019-01-09 MED ORDER — ONDANSETRON HCL 4 MG PO TABS: 4.0000 mg | ORAL_TABLET | Freq: Four times a day (QID) | ORAL | Status: DC | PRN

## 2019-01-09 MED ORDER — PANTOPRAZOLE SODIUM 40 MG PO TBEC
40.0000 mg | DELAYED_RELEASE_TABLET | Freq: Two times a day (BID) | ORAL | Status: DC
  Administered 2019-01-09 – 2019-01-14 (×11): 40 mg via ORAL
  Filled 2019-01-09 (×12): qty 1

## 2019-01-09 MED ORDER — SODIUM CHLORIDE 0.9 % IV SOLN
200.0000 mg | Freq: Once | INTRAVENOUS | Status: AC
  Administered 2019-01-09: 200 mg via INTRAVENOUS
  Filled 2019-01-09: qty 40

## 2019-01-09 MED ORDER — SODIUM CHLORIDE 0.9% FLUSH: 3.0000 mL | INTRAVENOUS | Status: DC | PRN

## 2019-01-09 MED ORDER — SACCHAROMYCES BOULARDII 250 MG PO CAPS
250.0000 mg | ORAL_CAPSULE | Freq: Every day | ORAL | Status: DC
  Administered 2019-01-09 – 2019-01-14 (×6): 250 mg via ORAL
  Filled 2019-01-09 (×7): qty 1

## 2019-01-09 MED ORDER — ONDANSETRON HCL 4 MG/2ML IJ SOLN
4.0000 mg | Freq: Four times a day (QID) | INTRAMUSCULAR | Status: DC | PRN
  Administered 2019-01-12: 4 mg via INTRAVENOUS
  Filled 2019-01-09: qty 2

## 2019-01-09 MED ORDER — SODIUM CHLORIDE 0.9 % IV SOLN: 250.0000 mL | INTRAVENOUS | Status: DC | PRN

## 2019-01-09 MED ORDER — HYDROCODONE-ACETAMINOPHEN 5-325 MG PO TABS: 1.0000 | ORAL_TABLET | ORAL | Status: DC | PRN

## 2019-01-09 MED ORDER — SODIUM CHLORIDE 0.9 % IV SOLN
100.0000 mg | INTRAVENOUS | Status: AC
  Administered 2019-01-10 – 2019-01-13 (×4): 100 mg via INTRAVENOUS
  Filled 2019-01-09 (×4): qty 20

## 2019-01-09 MED ORDER — SODIUM CHLORIDE 0.9% IV SOLUTION
Freq: Once | INTRAVENOUS | Status: AC
  Administered 2019-01-09: 16:00:00 via INTRAVENOUS

## 2019-01-09 MED ORDER — DEXAMETHASONE SODIUM PHOSPHATE 10 MG/ML IJ SOLN
6.0000 mg | INTRAMUSCULAR | Status: DC
  Administered 2019-01-10 – 2019-01-11 (×2): 6 mg via INTRAVENOUS
  Filled 2019-01-09 (×2): qty 1

## 2019-01-09 MED ORDER — POTASSIUM CHLORIDE CRYS ER 20 MEQ PO TBCR
20.0000 meq | EXTENDED_RELEASE_TABLET | Freq: Once | ORAL | Status: AC
  Administered 2019-01-09: 20 meq via ORAL
  Filled 2019-01-09: qty 1

## 2019-01-09 MED ORDER — DULOXETINE HCL 60 MG PO CPEP
60.0000 mg | ORAL_CAPSULE | Freq: Every day | ORAL | Status: DC
  Administered 2019-01-09 – 2019-01-13 (×5): 60 mg via ORAL
  Filled 2019-01-09 (×6): qty 1

## 2019-01-09 MED ORDER — SODIUM CHLORIDE 0.9% FLUSH
3.0000 mL | Freq: Two times a day (BID) | INTRAVENOUS | Status: DC
  Administered 2019-01-09 – 2019-01-14 (×10): 3 mL via INTRAVENOUS

## 2019-01-09 MED ORDER — ALBUTEROL SULFATE HFA 108 (90 BASE) MCG/ACT IN AERS
2.0000 | INHALATION_SPRAY | RESPIRATORY_TRACT | Status: DC | PRN
  Filled 2019-01-09: qty 6.7

## 2019-01-09 NOTE — H&P (Signed)
History and Physical    April Larsen LKG:[id number] DOB: [birth year] DOA: 01/09/2019  PCP: Rusty Aus, MD   Patient coming from: Home   Chief Complaint: Lucky Rathke, SOB   HPI: April Larsen is a 73 y.o. female with medical history significant for GERD, osteoarthritis, and recurrent diverticulitis status-post sigmoid colectomy, diagnosed with COVID-19 on 12/27/2018, and now presenting to the ED for evaluation of progressive malaise with lightheadedness and shortness of breath despite treatment with steroids and amoxicillin.  Patient reports that her symptoms began approximately 2 weeks ago with fevers, chills, diarrhea, and general malaise.  She reports that the fevers and diarrhea have abated, but she has become progressively more short of breath with generalized weakness, nausea, loss of appetite, and lightheadedness.  She denies any chest pain or palpitations, denies lower extremity swelling or tenderness, and denies abdominal pain or vomiting.   [facility] ED Course: Upon arrival to the ED, patient is found to be afebrile, saturating 88% on room air, slightly tachycardic, and with stable blood pressure.  EKG features a sinus rhythm and chest x-ray is concerning for bilateral interstitial thickening and patchy airspace disease consistent with COVID pneumonia.  Chemistry panel features a sodium of 134, potassium 3.4, elevated BUN to creatinine ratio, AST 59, and albumin at 3.1.  CBC is notable for a leukocytosis to 12,500 and hemoglobin 15.4.  High-sensitivity troponin is normal.  Procalcitonin is undetectable.  CRP is elevated to 12.4.  Patient was started on supplemental oxygen and given 10 mg of Decadron in the ED.  Review of Systems:  All other systems reviewed and apart from HPI, are negative.  Past Medical History:  Diagnosis Date  . Anemia    LAST HGB 15.2 ON 04-22-16  . Anxiety   . Arthritis   . Cancer ([place]) 2016   skin  . Cataract (lens) fragments  in eye following cataract surgery, bilateral 2013   one eye done then 4 weeks later the other eye done.  . Chronic ulcerative colitis, without complications ([place]) 09/02/4401  . Diverticulitis   . Esophagitis, reflux 10/04/2014  . Family history of adverse reaction to anesthesia    SISTER HARD TO WAKE UP and nausea and vomiting.  . Female stress incontinence 08/09/2013  . Gastroesophageal reflux disease without esophagitis 08/06/2016  . GERD (gastroesophageal reflux disease)   . Headache    MIGRAINE  . History of hiatal hernia   . Incomplete emptying of bladder 10/29/2013  . Migraine 07/26/2013  . PONV (postoperative nausea and vomiting)    nausea, does not remember vomiting.  Marland Kitchen Postherpetic neuralgia    RIGHT 7TH.CRANIAL NERVE from shingles followed by Bell's palsy  . Postmenopausal 11/09/2016  . PVC (premature ventricular contraction)   . Scoliosis   . Scoliosis (and kyphoscoliosis), idiopathic 07/26/2013  . Scoliosis of lumbosacral spine 01/18/2015  . Trochanteric bursitis 11/29/2014  . Ulcerative colitis ([place])   . Urethral prolapse 08/09/2013  . Urge incontinence 08/09/2013    Past Surgical History:  Procedure Laterality Date  . ABDOMINAL HYSTERECTOMY    . BACK SURGERY     L3-L4 AND L3-S1 DECOMPRESSION  . CARPAL TUNNEL RELEASE    . CHOLECYSTECTOMY N/A 08/21/2016   Procedure: LAPAROSCOPIC CHOLECYSTECTOMY WITH INTRAOPERATIVE CHOLANGIOGRAM;  Surgeon: Leonie Green, MD;  Location: [facility] ORS;  Service: General;  Laterality: N/A;  . COLONOSCOPY  11/06/2005  . COLONOSCOPY WITH PROPOFOL N/A 07/29/2016   Procedure: COLONOSCOPY WITH PROPOFOL;  Surgeon: Manya Silvas, MD;  Location: [facility]  ENDOSCOPY;  Service: Endoscopy;  Laterality: N/A;  . ESOPHAGOGASTRODUODENOSCOPY  11/06/2005  . ESOPHAGOGASTRODUODENOSCOPY (EGD) WITH PROPOFOL N/A 07/29/2016   Procedure: ESOPHAGOGASTRODUODENOSCOPY (EGD) WITH PROPOFOL;  Surgeon: Manya Silvas, MD;  Location: [facility] ENDOSCOPY;  Service: Endoscopy;  Laterality:  N/A;  . LAPAROSCOPIC LOW ANTERIOR RESECTION  11/24/2016   Procedure: LAPAROSCOPIC LOW ANTERIOR RESECTION;  Surgeon: Jules Husbands, MD;  Location: [facility] ORS;  Service: General;;     reports that she has never smoked. She has never used smokeless tobacco. She reports that she does not drink alcohol or use drugs.  Allergies  Allergen Reactions  . Sulfa Antibiotics Rash    Family History  Problem Relation Age of Onset  . Breast cancer Sister 66  . Breast cancer Paternal Aunt 49  . Breast cancer Cousin        1st paternal cousin     Prior to Admission medications   Medication Sig Start Date End Date Taking? Authorizing Provider  acetaminophen (TYLENOL) 500 MG tablet Take 1,000 mg by mouth every 6 (six) hours as needed for moderate pain or headache.     [provider]  Calcium Carbonate-Vitamin D (CALCIUM 600+D PO) Take 1 tablet by mouth 2 (two) times daily.    [provider]  cholecalciferol (VITAMIN D) 1000 units tablet Take 2,000 Units by mouth daily.    [provider]  DULoxetine (CYMBALTA) 60 MG capsule Take 60 mg by mouth at bedtime.  06/17/15   [provider]  ESTRACE VAGINAL 0.1 MG/GM vaginal cream Place 1 application vaginally at bedtime.  06/20/15   [provider]  ferrous sulfate 325 (65 FE) MG tablet Take 325 mg by mouth daily.     [provider]  lidocaine (LIDODERM) 5 % Place 1 patch onto the skin daily as needed (pain).  05/28/15   [provider]  omeprazole (PRILOSEC) 20 MG capsule Take 20 mg by mouth 2 (two) times daily. 07/29/15   [provider]  PREVNAR 13 SUSP injection ADM 0.5ML IM UTD 12/21/16   [provider]  saccharomyces boulardii (FLORASTOR) 250 MG capsule Take 250 mg by mouth daily.    [provider]  sennosides-docusate sodium (SENOKOT-S) 8.6-50 MG tablet Take 2 tablets by mouth at bedtime.    [provider]  [facility] injection ADM 0.5ML IM UTD 12/21/16    [provider]    Physical Exam: There were no vitals filed for this visit.   Constitutional: NAD, calm  Eyes: PERTLA, lids and conjunctivae normal ENMT: Mucous membranes are moist. Posterior pharynx clear of any exudate or lesions.   Neck: normal, supple, no masses, no thyromegaly Respiratory: Rhonchi, mild tachypnea, no wheezing. No accessory muscle use.  Cardiovascular: S1 & S2 heard, regular rate and rhythm. No extremity edema.   Abdomen: No distension, no tenderness, soft. Bowel sounds active.  Musculoskeletal: no clubbing / cyanosis. No joint deformity upper and lower extremities.   Skin: no significant rashes, lesions, ulcers. Warm, dry, well-perfused. Neurologic: No facial asymmetry. Sensation intact. Moving all extremities.  Psychiatric: Alert and oriented x 3. Pleasant, cooperative.    Labs on Admission: I have personally reviewed following labs and imaging studies  CBC: Recent Labs  Lab 01/08/19 1932  WBC 12.5*  NEUTROABS 11.4*  HGB 15.4*  HCT 44.7  MCV 89.6  PLT 786   Basic Metabolic Panel: Recent Labs  Lab 01/08/19 1932  NA 134*  K 3.4*  CL 100  CO2 22  GLUCOSE 120*  BUN 26*  CREATININE 0.89  CALCIUM 9.2   GFR: Estimated Creatinine Clearance: 60.8 mL/min (by C-G formula based on SCr of 0.89 mg/dL). Liver Function Tests: Recent Labs  Lab 01/08/19 1932  AST 59*  ALT 41  ALKPHOS 84  BILITOT 0.5  PROT 7.3  ALBUMIN 3.1*   No results for input(s): LIPASE, AMYLASE in the last 168 hours. No results for input(s): AMMONIA in the last 168 hours. Coagulation Profile: No results for input(s): INR, PROTIME in the last 168 hours. Cardiac Enzymes: No results for input(s): CKTOTAL, CKMB, CKMBINDEX, TROPONINI in the last 168 hours. BNP (last 3 results) No results for input(s): PROBNP in the last 8760 hours. HbA1C: No results for input(s): HGBA1C in the last 72 hours. CBG: No results for input(s): GLUCAP in the last 168 hours. Lipid Profile:  No results for input(s): CHOL, HDL, LDLCALC, TRIG, CHOLHDL, LDLDIRECT in the last 72 hours. Thyroid Function Tests: No results for input(s): TSH, T4TOTAL, FREET4, T3FREE, THYROIDAB in the last 72 hours. Anemia Panel: Recent Labs    01/08/19 1932  FERRITIN 721*   Urine analysis:    Component Value Date/Time   COLORURINE YELLOW (A) 10/14/2016 1312   APPEARANCEUR CLEAR (A) 10/14/2016 1312   LABSPEC 1.023 10/14/2016 1312   PHURINE 5.0 10/14/2016 1312   GLUCOSEU NEGATIVE 10/14/2016 1312   HGBUR NEGATIVE 10/14/2016 1312   BILIRUBINUR NEGATIVE 10/14/2016 1312   KETONESUR 5 (A) 10/14/2016 1312   PROTEINUR NEGATIVE 10/14/2016 1312   NITRITE NEGATIVE 10/14/2016 1312   LEUKOCYTESUR MODERATE (A) 10/14/2016 1312   Sepsis Labs: @LABRCNTIP (procalcitonin:4,lacticidven:4) )No results found for this or any previous visit (from the past 240 hour(s)).   Radiological Exams on Admission: Dg Chest Portable 1 View  Result Date: 01/08/2019 CLINICAL DATA:  Cough, shortness of breath, and weakness. COVID-19 positive. EXAM: PORTABLE CHEST 1 VIEW COMPARISON:  Chest x-ray dated December 12, 2008. FINDINGS: Normal heart size. Asymmetric left mid and right lower lung interstitial thickening with patchy airspace disease. No pleural effusion or pneumothorax. No acute osseous abnormality. Unchanged large hiatal hernia. IMPRESSION: 1. Bilateral asymmetric interstitial thickening and patchy airspace disease, consistent with COVID-19 pneumonia. 2. Large hiatal hernia. Electronically Signed   By: Titus Dubin M.D.   On: 01/08/2019 19:47    EKG: Independently reviewed. Sinus rhythm, rate 90, QTc 400 ms.    Assessment/Plan   1. COVID-19 pneumonia; acute hypoxic respiratory failure  - Patient was diagnosed with COVID-19 on 9/29 and now presents with progressive SOB, cough, loss of appetite, and generalized weakness, found to be saturating mid-80's on rm air with multifocal PNA on CXR, undetectable procalcitonin,  CRP 12.4  - She was started on Decadron and supplemental O2 in ED  - Continue supplemental O2 and Decadron, start remdesivir, trend inflammatory markers, continue supportive care    2. GERD  - Patient had large hiatal hernia and GERD without esophagitis  - Continue PPI    3. Hypokalemia  - Replaced, repeat chem panel in am     PPE: CAPR, gown, gloves  DVT prophylaxis: Lovenox  Code Status: Full  Family Communication: Discussed with patient  Consults called: None  Admission status: Inpatient     Vianne Bulls, MD [facility] Pager [phone]  If 7PM-7AM, please contact night-coverage [email or web address] Password [facility]  01/09/2019, 5:36 AM

## 2019-01-09 NOTE — Progress Notes (Signed)
PROGRESS NOTE                                                                                                                                                                                                             Patient Demographics:    April Larsen, is a 73 y.o. female, DOB - 03-Jul-1945, RUE:[id number]  Admit date - 01/09/2019   Admitting Physician Vianne Bulls, MD  Outpatient Primary MD for the patient is Rusty Aus, MD  LOS - 0   No chief complaint on file.      Brief Narrative    73 y.o. female with medical history significant for GERD, osteoarthritis, and recurrent diverticulitis status-post sigmoid colectomy, diagnosed with COVID-19 on 12/27/2018, presents to ED secondary to generalized weakness, worsening dyspnea, she was noted to be hypoxic, with COVID-19 for pneumonia on imaging, was admitted to [facility] for further management.   Subjective:    April Larsen today reports mild cough, dyspnea, generalized weakness and poor appetite.   Assessment  & Plan :    Principal Problem:   Pneumonia due to COVID-19 virus Active Problems:   Gastroesophageal reflux disease without esophagitis   Acute respiratory failure with hypoxia (HCC)   COVID-19 pneumonia; acute hypoxic respiratory failure  - Patient was diagnosed with COVID-19 on 9/29 and now presents with progressive SOB, cough, loss of appetite, and generalized weakness, found to be saturating mid-80's on rm air with multifocal PNA on CXR, undetectable procalcitonin, CRP 12.4  -Remains on 4 L nasal cannula this morning, I have urged her to use incentive spirometry, out of bed to chair, and to prone once in bed(she has cold  and this might be difficult, but she said she will try) -Continue to trend inflammatory markers -Continue with IV Decadron -Continue with IV Remdesivir. -She has a history of multiple diverticulitis episodes in the past, she is not a candidate for Actemra. -  She was started on Decadron and supplemental O2 in ED  -Discussed convulsant plasma with her, gave her the consent with the information's, she is considering it   COVID-19 Labs  Recent Labs    01/08/19 1932  FERRITIN 721*  CRP 12.4*    No results found for: SARSCOV2NAA GERD  - Patient had large hiatal hernia and GERD without esophagitis  - Continue PPI  Hypokalemia  - Replaced, repeat chem panel in am     Code Status : Full  Family Communication  : Discussed with patient  Disposition Plan  : Home  Barriers For Discharge : remains hypoxic, and IV steroids, and IV Remdesivir  Consults  :  None  Procedures  : None  DVT Prophylaxis  :  [place] lovenox  Lab Results  Component Value Date   PLT 355 01/08/2019    Antibiotics  :    Anti-infectives (From admission, onward)   Start     Dose/Rate Route Frequency Ordered Stop   01/10/19 1000  remdesivir 100 mg in sodium chloride 0.9 % 250 mL IVPB     100 mg 500 mL/hr over 30 Minutes Intravenous Every 24 hours 01/09/19 0543 01/14/19 0959   01/09/19 0630  remdesivir 200 mg in sodium chloride 0.9 % 250 mL IVPB     200 mg 500 mL/hr over 30 Minutes Intravenous Once 01/09/19 0543 01/09/19 0717        Objective:   Vitals:   01/09/19 0500 01/09/19 0717  BP: (!) 153/78 135/72  Pulse: 84 75  Resp: 20 20  Temp: 98.9 F (37.2 C) 98 F (36.7 C)  TempSrc: Oral Oral  SpO2: 90% 94%  Weight: 73.7 kg   Height: 5' 5"  (1.651 m)     Wt Readings from Last 3 Encounters:  01/09/19 73.7 kg  01/08/19 77.1 kg  12/31/16 75.9 kg    No intake or output data in the 24 hours ending 01/09/19 0946   Physical Exam  Awake Alert, Oriented X 3, No new F.N deficits, Normal affect [place].AT,PERRAL Supple Neck,No JVD,  Symmetrical Ch est wall movement, Good air movement bilaterally, CTAB RRR,No Gallops,Rubs or new Murmurs, No Parasternal Heave +ve B.Sounds, Abd Soft, No tenderness,No rebound - guarding or rigidity. No Cyanosis, Clubbing  or edema, No new Rash or bruise     Data Review:    CBC Recent Labs  Lab 01/08/19 1932  WBC 12.5*  HGB 15.4*  HCT 44.7  PLT 355  MCV 89.6  MCH 30.9  MCHC 34.5  RDW 13.1  LYMPHSABS 0.4*  MONOABS 0.5  EOSABS 0.0  BASOSABS 0.0    Chemistries  Recent Labs  Lab 01/08/19 1932  NA 134*  K 3.4*  CL 100  CO2 22  GLUCOSE 120*  BUN 26*  CREATININE 0.89  CALCIUM 9.2  AST 59*  ALT 41  ALKPHOS 84  BILITOT 0.5   ------------------------------------------------------------------------------------------------------------------ No results for input(s): CHOL, HDL, LDLCALC, TRIG, CHOLHDL, LDLDIRECT in the last 72 hours.  No results found for: HGBA1C ------------------------------------------------------------------------------------------------------------------ No results for input(s): TSH, T4TOTAL, T3FREE, THYROIDAB in the last 72 hours.  Invalid input(s): FREET3 ------------------------------------------------------------------------------------------------------------------ Recent Labs    01/08/19 1932  FERRITIN 721*    Coagulation profile No results for input(s): INR, PROTIME in the last 168 hours.  No results for input(s): DDIMER in the last 72 hours.  Cardiac Enzymes No results for input(s): CKMB, TROPONINI, MYOGLOBIN in the last 168 hours.  Invalid input(s): CK ------------------------------------------------------------------------------------------------------------------ No results found for: BNP  Inpatient Medications  Scheduled Meds: . dexamethasone (DECADRON) injection  6 mg Intravenous Q24H  . DULoxetine  60 mg Oral QHS  . enoxaparin (LOVENOX) injection  40 mg Subcutaneous Daily  . pantoprazole  40 mg Oral BID AC  . saccharomyces boulardii  250 mg Oral Daily  . senna-docusate  2 tablet Oral QHS  . sodium chloride flush  3 mL Intravenous Q12H  Continuous Infusions: . sodium chloride    . [START ON 01/10/2019] remdesivir 100 mg in NS 250 mL      PRN Meds:.sodium chloride, acetaminophen, albuterol, HYDROcodone-acetaminophen, lidocaine, ondansetron **OR** ondansetron (ZOFRAN) IV, polyethylene glycol, sodium chloride flush  Micro Results No results found for this or any previous visit (from the past 240 hour(s)).  Radiology Reports Dg Chest Portable 1 View  Result Date: 01/08/2019 CLINICAL DATA:  Cough, shortness of breath, and weakness. COVID-19 positive. EXAM: PORTABLE CHEST 1 VIEW COMPARISON:  Chest x-ray dated December 12, 2008. FINDINGS: Normal heart size. Asymmetric left mid and right lower lung interstitial thickening with patchy airspace disease. No pleural effusion or pneumothorax. No acute osseous abnormality. Unchanged large hiatal hernia. IMPRESSION: 1. Bilateral asymmetric interstitial thickening and patchy airspace disease, consistent with COVID-19 pneumonia. 2. Large hiatal hernia. Electronically Signed   By: Titus Dubin M.D.   On: 01/08/2019 19:47     Phillips Climes M.D on 01/09/2019 at 9:46 AM  Between 7am to 7pm - Pager - [phone]  After 7pm go to [email or web address] - password [facility]  [facility] -  Office  [phone]

## 2019-01-09 NOTE — Progress Notes (Signed)
Dr Olevia Bowens informed about the pt's change in O2 requirements.

## 2019-01-09 NOTE — Plan of Care (Signed)
  Problem: Education: Goal: Knowledge of risk factors and measures for prevention of condition will improve Outcome: Progressing   Problem: Coping: Goal: Psychosocial and spiritual needs will be supported Outcome: Progressing   Problem: Respiratory: Goal: Will maintain a patent airway Outcome: Progressing Goal: Complications related to the disease process, condition or treatment will be avoided or minimized Outcome: Progressing   

## 2019-01-09 NOTE — Progress Notes (Signed)
Pharmacy Antibiotic Note  April Larsen is a 73 y.o. female admitted on 01/09/2019 with cellulitis and COVID 19.  Pharmacy has been consulted for Remdesivir dosing.  CXR shows bilateral asymmetric interstitial thickening and patchy airspace disease, consistent with COVID-19 pneumonia. Pt requiring supplemental oxygen (4L [place]) ALT 41  Plan: Remdesivir 244m IV now then 103mIV daily x 4 days Will f/u ALT and pt's clinical condition     Temp (24hrs), Avg:98.6 F (37 C), Min:98.1 F (36.7 C), Max:99 F (37.2 C)  Recent Labs  Lab 01/08/19 1932  WBC 12.5*  CREATININE 0.89    Estimated Creatinine Clearance: 60.8 mL/min (by C-G formula based on SCr of 0.89 mg/dL).    Allergies  Allergen Reactions  . Sulfa Antibiotics Rash    Antimicrobials this admission: 10/12 Remdesivir >> 10/16   Thank you for allowing pharmacy to be a part of this patient's care.  CaSherlon HandingPharmD, BCPS CGV Clinical pharmacist phone [phone]/02/2019 5:39 AM

## 2019-01-09 NOTE — Plan of Care (Signed)
Pt. Received to room 115. NAD noted. 02 at 4L/[place] and sats 88-92 %. Denies any c/o at present. Admission assessment completed.

## 2019-01-09 NOTE — ED Notes (Signed)
Pt leaving with [facility] now

## 2019-01-10 LAB — CBC WITH DIFFERENTIAL/PLATELET
Abs Immature Granulocytes: 0.16 10*3/uL — ABNORMAL HIGH (ref 0.00–0.07)
Basophils Absolute: 0 10*3/uL (ref 0.0–0.1)
Basophils Relative: 0 %
Eosinophils Absolute: 0 10*3/uL (ref 0.0–0.5)
Eosinophils Relative: 0 %
HCT: 41.5 % (ref 36.0–46.0)
Hemoglobin: 13.8 g/dL (ref 12.0–15.0)
Immature Granulocytes: 1 %
Lymphocytes Relative: 6 %
Lymphs Abs: 0.9 10*3/uL (ref 0.7–4.0)
MCH: 30.7 pg (ref 26.0–34.0)
MCHC: 33.3 g/dL (ref 30.0–36.0)
MCV: 92.4 fL (ref 80.0–100.0)
Monocytes Absolute: 0.5 10*3/uL (ref 0.1–1.0)
Monocytes Relative: 4 %
Neutro Abs: 13 10*3/uL — ABNORMAL HIGH (ref 1.7–7.7)
Neutrophils Relative %: 89 %
Platelets: 378 10*3/uL (ref 150–400)
RBC: 4.49 MIL/uL (ref 3.87–5.11)
RDW: 13.3 % (ref 11.5–15.5)
WBC: 14.7 10*3/uL — ABNORMAL HIGH (ref 4.0–10.5)
nRBC: 0 % (ref 0.0–0.2)

## 2019-01-10 LAB — PREPARE FRESH FROZEN PLASMA: Unit division: 0

## 2019-01-10 LAB — COMPREHENSIVE METABOLIC PANEL
ALT: 38 U/L (ref 0–44)
AST: 36 U/L (ref 15–41)
Albumin: 3 g/dL — ABNORMAL LOW (ref 3.5–5.0)
Alkaline Phosphatase: 65 U/L (ref 38–126)
Anion gap: 11 (ref 5–15)
BUN: 27 mg/dL — ABNORMAL HIGH (ref 8–23)
CO2: 22 mmol/L (ref 22–32)
Calcium: 9.2 mg/dL (ref 8.9–10.3)
Chloride: 102 mmol/L (ref 98–111)
Creatinine, Ser: 0.89 mg/dL (ref 0.44–1.00)
GFR calc Af Amer: >60 mL/min (ref >60)
GFR calc non Af Amer: >60 mL/min (ref >60)
Glucose, Bld: 79 mg/dL (ref 70–99)
Potassium: 3.8 mmol/L (ref 3.5–5.1)
Sodium: 135 mmol/L (ref 135–145)
Total Bilirubin: 0.8 mg/dL (ref 0.3–1.2)
Total Protein: 6.6 g/dL (ref 6.5–8.1)

## 2019-01-10 LAB — BPAM FFP
Blood Product Expiration Date: 202010131100
ISSUE DATE / TIME: 202010121343
Unit Type and Rh: 9500

## 2019-01-10 LAB — C-REACTIVE PROTEIN: CRP: 3.2 mg/dL — ABNORMAL HIGH (ref <1.0)

## 2019-01-10 LAB — D-DIMER, QUANTITATIVE: D-Dimer, Quant: 0.74 ug/mL-FEU — ABNORMAL HIGH (ref 0.00–0.50)

## 2019-01-10 LAB — PHOSPHORUS: Phosphorus: 1.6 mg/dL — ABNORMAL LOW (ref 2.5–4.6)

## 2019-01-10 LAB — FERRITIN: Ferritin: 483 ng/mL — ABNORMAL HIGH (ref 11–307)

## 2019-01-10 LAB — MAGNESIUM: Magnesium: 1.9 mg/dL (ref 1.7–2.4)

## 2019-01-10 MED ORDER — K PHOS MONO-SOD PHOS DI & MONO 155-852-130 MG PO TABS: 250.0000 mg | ORAL_TABLET | Freq: Three times a day (TID) | ORAL | Status: DC

## 2019-01-10 NOTE — Progress Notes (Signed)
PROGRESS NOTE                                                                                                                                                                                                             Patient Demographics:    April Larsen, is a 73 y.o. female, DOB - [birth year], GHW:[id number]  Admit date - 01/09/2019   Admitting Physician Vianne Bulls, MD  Outpatient Primary MD for the patient is Rusty Aus, MD  LOS - 1   No chief complaint on file.      Brief Narrative    73 y.o. female with medical history significant for GERD, osteoarthritis, and recurrent diverticulitis status-post sigmoid colectomy, diagnosed with COVID-19 on 12/27/2018, presents to ED secondary to generalized weakness, worsening dyspnea, she was noted to be hypoxic, with COVID-19 for pneumonia on imaging, was admitted to [facility] for further management.   Subjective:    April Larsen today reports mild cough,  generalized weakness and poor appetite.  Reports no change in her dyspnea   Assessment  & Plan :    Principal Problem:   Pneumonia due to COVID-19 virus Active Problems:   Gastroesophageal reflux disease without esophagitis   Acute respiratory failure with hypoxia (HCC)   COVID-19 pneumonia; acute hypoxic respiratory failure  - Patient was diagnosed with COVID-19 on 9/29 and now presents with progressive SOB, cough, loss of appetite, and generalized weakness, found to be saturating mid-80's on rm air with multifocal PNA on CXR, undetectable procalcitonin, CRP 12.4  -She remains on 4 to 5 L this morning, I have urged her to get out of bed to chair, use incentive spirometry , he is unable to prone given severe scoliosis . -Continue to trend inflammatory markers, CRP trending down which is reassuring -Continue with IV Decadron -Continue with IV Remdesivir. -She has a history of multiple diverticulitis episodes in the past, she is not a candidate  for Actemra. -See if convulsant plasma 10/12   COVID-19 Labs  Recent Labs    01/08/19 1932 01/10/19 0100  DDIMER  --  0.74*  FERRITIN 721* 483*  CRP 12.4* 3.2*    No results found for: SARSCOV2NAA GERD  - Patient had large hiatal hernia and GERD without esophagitis  - Continue PPI    Hypokalemia  - Replaced, repeat chem panel in  am     Code Status : Full  Family Communication  : Discussed with patient  Disposition Plan  : Home  Barriers For Discharge : remains hypoxic, and IV steroids, and IV Remdesivir  Consults  :  None  Procedures  : None  DVT Prophylaxis  :  [place] lovenox  Lab Results  Component Value Date   PLT 378 01/10/2019    Antibiotics  :    Anti-infectives (From admission, onward)   Start     Dose/Rate Route Frequency Ordered Stop   01/10/19 1000  remdesivir 100 mg in sodium chloride 0.9 % 250 mL IVPB     100 mg 500 mL/hr over 30 Minutes Intravenous Every 24 hours 01/09/19 0543 01/14/19 0959   01/09/19 0630  remdesivir 200 mg in sodium chloride 0.9 % 250 mL IVPB     200 mg 500 mL/hr over 30 Minutes Intravenous Once 01/09/19 0543 01/09/19 0725        Objective:   Vitals:   01/10/19 0700 01/10/19 0816 01/10/19 1131 01/10/19 1132  BP:  123/62 109/71   Pulse:  82 92   Resp:  20    Temp:  98.2 F (36.8 C) 98.4 F (36.9 C)   TempSrc:  Oral Oral   SpO2: 94% 92% 93% 93%  Weight:      Height:        Wt Readings from Last 3 Encounters:  01/09/19 73.7 kg  01/08/19 77.1 kg  12/31/16 75.9 kg     Intake/Output Summary (Last 24 hours) at 01/10/2019 1144 Last data filed at 01/10/2019 0600 Gross per 24 hour  Intake 1149 ml  Output 700 ml  Net 449 ml     Physical Exam  Awake Alert, Oriented X 3, No new F.N deficits, Normal affect Symmetrical Chest wall movement, Good air movement bilaterally, CTAB RRR,No Gallops,Rubs or new Murmurs, No Parasternal Heave +ve B.Sounds, Abd Soft, No tenderness, No rebound - guarding or rigidity. No  Cyanosis, Clubbing or edema, No new Rash or bruise       Data Review:    CBC Recent Labs  Lab 01/08/19 1932 01/10/19 0100  WBC 12.5* 14.7*  HGB 15.4* 13.8  HCT 44.7 41.5  PLT 355 378  MCV 89.6 92.4  MCH 30.9 30.7  MCHC 34.5 33.3  RDW 13.1 13.3  LYMPHSABS 0.4* 0.9  MONOABS 0.5 0.5  EOSABS 0.0 0.0  BASOSABS 0.0 0.0    Chemistries  Recent Labs  Lab 01/08/19 1932 01/10/19 0100  NA 134* 135  K 3.4* 3.8  CL 100 102  CO2 22 22  GLUCOSE 120* 79  BUN 26* 27*  CREATININE 0.89 0.89  CALCIUM 9.2 9.2  MG  --  1.9  AST 59* 36  ALT 41 38  ALKPHOS 84 65  BILITOT 0.5 0.8   ------------------------------------------------------------------------------------------------------------------ No results for input(s): CHOL, HDL, LDLCALC, TRIG, CHOLHDL, LDLDIRECT in the last 72 hours.  No results found for: HGBA1C ------------------------------------------------------------------------------------------------------------------ No results for input(s): TSH, T4TOTAL, T3FREE, THYROIDAB in the last 72 hours.  Invalid input(s): FREET3 ------------------------------------------------------------------------------------------------------------------ Recent Labs    01/08/19 1932 01/10/19 0100  FERRITIN 721* 483*    Coagulation profile No results for input(s): INR, PROTIME in the last 168 hours.  Recent Labs    01/10/19 0100  DDIMER 0.74*    Cardiac Enzymes No results for input(s): CKMB, TROPONINI, MYOGLOBIN in the last 168 hours.  Invalid input(s): CK ------------------------------------------------------------------------------------------------------------------ No results found for: BNP  Inpatient Medications  Scheduled Meds: .  dexamethasone (DECADRON) injection  6 mg Intravenous Q24H  . DULoxetine  60 mg Oral QHS  . enoxaparin (LOVENOX) injection  40 mg Subcutaneous Daily  . pantoprazole  40 mg Oral BID AC  . saccharomyces boulardii  250 mg Oral Daily  .  senna-docusate  2 tablet Oral QHS  . sodium chloride flush  3 mL Intravenous Q12H   Continuous Infusions: . sodium chloride    . remdesivir 100 mg in NS 250 mL 100 mg (01/10/19 0814)   PRN Meds:.sodium chloride, acetaminophen, albuterol, HYDROcodone-acetaminophen, lidocaine, ondansetron **OR** ondansetron (ZOFRAN) IV, polyethylene glycol, sodium chloride flush  Micro Results No results found for this or any previous visit (from the past 240 hour(s)).  Radiology Reports Dg Chest Portable 1 View  Result Date: 01/08/2019 CLINICAL DATA:  Cough, shortness of breath, and weakness. COVID-19 positive. EXAM: PORTABLE CHEST 1 VIEW COMPARISON:  Chest x-ray dated December 12, 2008. FINDINGS: Normal heart size. Asymmetric left mid and right lower lung interstitial thickening with patchy airspace disease. No pleural effusion or pneumothorax. No acute osseous abnormality. Unchanged large hiatal hernia. IMPRESSION: 1. Bilateral asymmetric interstitial thickening and patchy airspace disease, consistent with COVID-19 pneumonia. 2. Large hiatal hernia. Electronically Signed   By: Titus Dubin M.D.   On: 01/08/2019 19:47     Phillips Climes M.D on 01/10/2019 at 11:44 AM  Between 7am to 7pm - Pager - [phone]  After 7pm go to [email or web address] - password [facility]  [facility] -  Office  [phone]

## 2019-01-11 LAB — COMPREHENSIVE METABOLIC PANEL
ALT: 30 U/L (ref 0–44)
AST: 24 U/L (ref 15–41)
Albumin: 2.9 g/dL — ABNORMAL LOW (ref 3.5–5.0)
Alkaline Phosphatase: 67 U/L (ref 38–126)
Anion gap: 12 (ref 5–15)
BUN: 23 mg/dL (ref 8–23)
CO2: 22 mmol/L (ref 22–32)
Calcium: 9.1 mg/dL (ref 8.9–10.3)
Chloride: 103 mmol/L (ref 98–111)
Creatinine, Ser: 0.71 mg/dL (ref 0.44–1.00)
GFR calc Af Amer: >60 mL/min (ref >60)
GFR calc non Af Amer: >60 mL/min (ref >60)
Glucose, Bld: 81 mg/dL (ref 70–99)
Potassium: 3.9 mmol/L (ref 3.5–5.1)
Sodium: 137 mmol/L (ref 135–145)
Total Bilirubin: 0.5 mg/dL (ref 0.3–1.2)
Total Protein: 6.4 g/dL — ABNORMAL LOW (ref 6.5–8.1)

## 2019-01-11 LAB — CBC WITH DIFFERENTIAL/PLATELET
Abs Immature Granulocytes: 0.13 10*3/uL — ABNORMAL HIGH (ref 0.00–0.07)
Basophils Absolute: 0 10*3/uL (ref 0.0–0.1)
Basophils Relative: 0 %
Eosinophils Absolute: 0.1 10*3/uL (ref 0.0–0.5)
Eosinophils Relative: 1 %
HCT: 40.9 % (ref 36.0–46.0)
Hemoglobin: 13.6 g/dL (ref 12.0–15.0)
Immature Granulocytes: 1 %
Lymphocytes Relative: 6 %
Lymphs Abs: 0.9 10*3/uL (ref 0.7–4.0)
MCH: 30.6 pg (ref 26.0–34.0)
MCHC: 33.3 g/dL (ref 30.0–36.0)
MCV: 92.1 fL (ref 80.0–100.0)
Monocytes Absolute: 0.7 10*3/uL (ref 0.1–1.0)
Monocytes Relative: 5 %
Neutro Abs: 12.3 10*3/uL — ABNORMAL HIGH (ref 1.7–7.7)
Neutrophils Relative %: 87 %
Platelets: 383 10*3/uL (ref 150–400)
RBC: 4.44 MIL/uL (ref 3.87–5.11)
RDW: 13.3 % (ref 11.5–15.5)
WBC: 14.1 10*3/uL — ABNORMAL HIGH (ref 4.0–10.5)
nRBC: 0 % (ref 0.0–0.2)

## 2019-01-11 LAB — D-DIMER, QUANTITATIVE: D-Dimer, Quant: 0.75 ug/mL-FEU — ABNORMAL HIGH (ref 0.00–0.50)

## 2019-01-11 LAB — C-REACTIVE PROTEIN: CRP: 11.7 mg/dL — ABNORMAL HIGH (ref <1.0)

## 2019-01-11 MED ORDER — DEXAMETHASONE SODIUM PHOSPHATE 10 MG/ML IJ SOLN
6.0000 mg | Freq: Two times a day (BID) | INTRAMUSCULAR | Status: DC
  Administered 2019-01-11 – 2019-01-14 (×6): 6 mg via INTRAVENOUS
  Filled 2019-01-11 (×6): qty 1

## 2019-01-11 MED ORDER — POLYETHYLENE GLYCOL 3350 17 G PO PACK
17.0000 g | PACK | Freq: Two times a day (BID) | ORAL | Status: DC
  Administered 2019-01-11 – 2019-01-14 (×3): 17 g via ORAL
  Filled 2019-01-11 (×6): qty 1

## 2019-01-11 MED ORDER — VITAMIN C 500 MG PO TABS
500.0000 mg | ORAL_TABLET | Freq: Every day | ORAL | Status: DC
  Administered 2019-01-11 – 2019-01-14 (×4): 500 mg via ORAL
  Filled 2019-01-11 (×3): qty 1

## 2019-01-11 MED ORDER — ZINC SULFATE 220 (50 ZN) MG PO CAPS
220.0000 mg | ORAL_CAPSULE | Freq: Every day | ORAL | Status: DC
  Administered 2019-01-11 – 2019-01-14 (×4): 220 mg via ORAL
  Filled 2019-01-11 (×3): qty 1

## 2019-01-11 NOTE — Progress Notes (Signed)
PROGRESS NOTE  April Larsen MVH:[id number] DOB: [birth year] DOA: 01/09/2019  PCP: Rusty Aus, MD  Brief History/Interval Summary: 73 y.o.femalewith medical history significant forGERD, osteoarthritis, and recurrent diverticulitis status-post sigmoid colectomy, diagnosed with COVID-19 on 12/27/2018, presents to ED secondary to generalized weakness, worsening dyspnea, she was noted to be hypoxic, with COVID-19 for pneumonia on imaging, was admitted to [facility] for further management.  Reason for Visit: Acute respiratory failure with hypoxia.  Pneumonia due to COVID-19.  Consultants: None  Procedures: None  Antibiotics: Anti-infectives (From admission, onward)   Start     Dose/Rate Route Frequency Ordered Stop   01/10/19 1000  remdesivir 100 mg in sodium chloride 0.9 % 250 mL IVPB     100 mg 500 mL/hr over 30 Minutes Intravenous Every 24 hours 01/09/19 0543 01/14/19 0959   01/09/19 0630  remdesivir 200 mg in sodium chloride 0.9 % 250 mL IVPB     200 mg 500 mL/hr over 30 Minutes Intravenous Once 01/09/19 0543 01/09/19 0725      Subjective/Interval History: Patient states that she is feeling slightly better.  Not as short of breath as before.  Denies any nausea vomiting.  Urinating well.  Complains of constipation.    Assessment/Plan:  Acute Hypoxic Resp. Failure/Pneumonia due to COVID-19  COVID-19 Labs  Recent Labs    01/08/19 1932 01/10/19 0100 01/11/19 0118  DDIMER  --  0.74* 0.75*  FERRITIN 721* 483*  --   CRP 12.4* 3.2* 11.7*    Positive COVID-19 test results from 9/29 on care everywhere.  Fever: Remains afebrile Oxygen requirements: On 2 L of oxygen via nasal cannula.  Saturating in the early 90s. Antibacterials: None Remdesivir: Day 3 today Steroids: Dexamethasone 6 mg daily Diuretics: Not on a regular basis Actemra: Not given Vitamin C and Zinc: Will initiate DVT Prophylaxis:  Lovenox 40 mg daily Convalescent plasma: Transfused on 10/12   Patient's respiratory status appears to be improving.  She is not as short of breath as previously.  On 2 L of oxygen.  She remains on steroids and Remdesivir.  Her CRP has gone up from 3.2-11.7.  We will increase the dose of her dexamethasone.  She was given convalescent plasma.  Initiate vitamin C and zinc.  Continue to trend inflammatory markers.  D-dimer is 0.75.  Incentive spirometry, prone positioning and mobilization as much as possible.  Due to history of diverticulitis in the past she is not thought to be a candidate for Actemra.  Leukocytosis is due to steroids.  History of GERD Continue with the PPI  Hypokalemia Potassium is normal today.  Magnesium was 1.9 yesterday.  DVT Prophylaxis: Lovenox PUD Prophylaxis: Protonix Code Status: No code Family Communication: Discussed with the patient Disposition Plan: Mobilize.  Hopefully back home when improved.   Medications:  Scheduled: . dexamethasone (DECADRON) injection  6 mg Intravenous Q24H  . DULoxetine  60 mg Oral QHS  . enoxaparin (LOVENOX) injection  40 mg Subcutaneous Daily  . pantoprazole  40 mg Oral BID AC  . polyethylene glycol  17 g Oral BID  . saccharomyces boulardii  250 mg Oral Daily  . senna-docusate  2 tablet Oral QHS  . sodium chloride flush  3 mL Intravenous Q12H   Continuous: . sodium chloride    . remdesivir 100 mg in NS 250 mL 100 mg (01/11/19 0839)   WUX:LKGMWN chloride, acetaminophen, albuterol, HYDROcodone-acetaminophen, lidocaine, ondansetron **OR** ondansetron (ZOFRAN) IV, sodium chloride flush   Objective:  Vital Signs  Vitals:  01/10/19 1545 01/10/19 1948 01/11/19 0327 01/11/19 0740  BP: 111/75 122/73 112/67 119/71  Pulse: 88 81 80 86  Resp:   18 20  Temp: 98.4 F (36.9 C) 98.1 F (36.7 C) 98.5 F (36.9 C) 98.2 F (36.8 C)  TempSrc: Oral Oral Oral Oral  SpO2: 90% 92% 92% 94%  Weight:      Height:        Intake/Output Summary (Last 24 hours) at 01/11/2019 1101 Last data filed at  01/11/2019 0000 Gross per 24 hour  Intake 130 ml  Output -  Net 130 ml   Filed Weights   01/09/19 0500  Weight: 73.7 kg    General appearance: Awake alert.  In no distress Resp: Mildly tachypneic at rest.  Coarse breath sound bilaterally with few crackles at the bases.  No wheezing or rhonchi. Cardio: S1-S2 is normal regular.  No S3-S4.  No rubs murmurs or bruit GI: Abdomen is soft.  Nontender nondistended.  Bowel sounds are present normal.  No masses organomegaly Extremities: No edema.  Full range of motion of lower extremities. Neurologic: Alert and oriented x3.  No focal neurological deficits.    Lab Results:  Data Reviewed: I have personally reviewed following labs and imaging studies  CBC: Recent Labs  Lab 01/08/19 1932 01/10/19 0100 01/11/19 0118  WBC 12.5* 14.7* 14.1*  NEUTROABS 11.4* 13.0* 12.3*  HGB 15.4* 13.8 13.6  HCT 44.7 41.5 40.9  MCV 89.6 92.4 92.1  PLT 355 378 694    Basic Metabolic Panel: Recent Labs  Lab 01/08/19 1932 01/10/19 0100 01/11/19 0118  NA 134* 135 137  K 3.4* 3.8 3.9  CL 100 102 103  CO2 22 22 22   GLUCOSE 120* 79 81  BUN 26* 27* 23  CREATININE 0.89 0.89 0.71  CALCIUM 9.2 9.2 9.1  MG  --  1.9  --   PHOS  --  1.6*  --     GFR: Estimated Creatinine Clearance: 64.9 mL/min (by C-G formula based on SCr of 0.71 mg/dL).  Liver Function Tests: Recent Labs  Lab 01/08/19 1932 01/10/19 0100 01/11/19 0118  AST 59* 36 24  ALT 41 38 30  ALKPHOS 84 65 67  BILITOT 0.5 0.8 0.5  PROT 7.3 6.6 6.4*  ALBUMIN 3.1* 3.0* 2.9*     Anemia Panel: Recent Labs    01/08/19 1932 01/10/19 0100  FERRITIN 721* 2*        Radiology Studies: No results found.     LOS: 2 days   [facility] Pager on [email or web address]  01/11/2019, 11:01 AM

## 2019-01-12 DIAGNOSIS — K5900 Constipation, unspecified: Secondary | ICD-10-CM

## 2019-01-12 LAB — CBC WITH DIFFERENTIAL/PLATELET
Abs Immature Granulocytes: 0.18 10*3/uL — ABNORMAL HIGH (ref 0.00–0.07)
Basophils Absolute: 0 10*3/uL (ref 0.0–0.1)
Basophils Relative: 0 %
Eosinophils Absolute: 0.1 10*3/uL (ref 0.0–0.5)
Eosinophils Relative: 1 %
HCT: 39.1 % (ref 36.0–46.0)
Hemoglobin: 13 g/dL (ref 12.0–15.0)
Immature Granulocytes: 1 %
Lymphocytes Relative: 7 %
Lymphs Abs: 0.9 10*3/uL (ref 0.7–4.0)
MCH: 30.5 pg (ref 26.0–34.0)
MCHC: 33.2 g/dL (ref 30.0–36.0)
MCV: 91.8 fL (ref 80.0–100.0)
Monocytes Absolute: 0.7 10*3/uL (ref 0.1–1.0)
Monocytes Relative: 5 %
Neutro Abs: 10.7 10*3/uL — ABNORMAL HIGH (ref 1.7–7.7)
Neutrophils Relative %: 86 %
Platelets: 424 10*3/uL — ABNORMAL HIGH (ref 150–400)
RBC: 4.26 MIL/uL (ref 3.87–5.11)
RDW: 13.2 % (ref 11.5–15.5)
WBC: 12.7 10*3/uL — ABNORMAL HIGH (ref 4.0–10.5)
nRBC: 0 % (ref 0.0–0.2)

## 2019-01-12 LAB — COMPREHENSIVE METABOLIC PANEL
ALT: 25 U/L (ref 0–44)
AST: 17 U/L (ref 15–41)
Albumin: 2.8 g/dL — ABNORMAL LOW (ref 3.5–5.0)
Alkaline Phosphatase: 67 U/L (ref 38–126)
Anion gap: 10 (ref 5–15)
BUN: 22 mg/dL (ref 8–23)
CO2: 21 mmol/L — ABNORMAL LOW (ref 22–32)
Calcium: 8.6 mg/dL — ABNORMAL LOW (ref 8.9–10.3)
Chloride: 103 mmol/L (ref 98–111)
Creatinine, Ser: 0.67 mg/dL (ref 0.44–1.00)
GFR calc Af Amer: >60 mL/min (ref >60)
GFR calc non Af Amer: >60 mL/min (ref >60)
Glucose, Bld: 101 mg/dL — ABNORMAL HIGH (ref 70–99)
Potassium: 3.8 mmol/L (ref 3.5–5.1)
Sodium: 134 mmol/L — ABNORMAL LOW (ref 135–145)
Total Bilirubin: 0.6 mg/dL (ref 0.3–1.2)
Total Protein: 6.1 g/dL — ABNORMAL LOW (ref 6.5–8.1)

## 2019-01-12 LAB — D-DIMER, QUANTITATIVE: D-Dimer, Quant: 0.6 ug/mL-FEU — ABNORMAL HIGH (ref 0.00–0.50)

## 2019-01-12 LAB — C-REACTIVE PROTEIN: CRP: 10.3 mg/dL — ABNORMAL HIGH (ref <1.0)

## 2019-01-12 MED ORDER — K PHOS MONO-SOD PHOS DI & MONO 155-852-130 MG PO TABS
500.0000 mg | ORAL_TABLET | Freq: Two times a day (BID) | ORAL | Status: AC
  Administered 2019-01-12 (×2): 500 mg via ORAL
  Filled 2019-01-12 (×2): qty 2

## 2019-01-12 MED ORDER — SALINE SPRAY 0.65 % NA SOLN
2.0000 | NASAL | Status: DC | PRN
  Filled 2019-01-12: qty 44

## 2019-01-12 NOTE — Progress Notes (Addendum)
PROGRESS NOTE  April Larsen XBJ:[id number] DOB: [birth year] DOA: 01/09/2019  PCP: Rusty Aus, MD  Brief History/Interval Summary: 73 y.o.femalewith medical history significant forGERD, osteoarthritis, and recurrent diverticulitis status-post sigmoid colectomy, diagnosed with COVID-19 on 12/27/2018, presents to ED secondary to generalized weakness, worsening dyspnea, she was noted to be hypoxic, with COVID-19 for pneumonia on imaging, was admitted to [facility] for further management.  Reason for Visit: Acute respiratory failure with hypoxia.  Pneumonia due to COVID-19.  Consultants: None  Procedures: None  Antibiotics: Anti-infectives (From admission, onward)   Start     Dose/Rate Route Frequency Ordered Stop   01/10/19 1000  remdesivir 100 mg in sodium chloride 0.9 % 250 mL IVPB     100 mg 500 mL/hr over 30 Minutes Intravenous Every 24 hours 01/09/19 0543 01/14/19 0959   01/09/19 0630  remdesivir 200 mg in sodium chloride 0.9 % 250 mL IVPB     200 mg 500 mL/hr over 30 Minutes Intravenous Once 01/09/19 0543 01/09/19 0725      Subjective/Interval History: Patient states that she is feeling better.  Not as short of breath as before.  Slept well overnight.  Had been off of oxygen overnight but was placed back on oxygen this morning.  Occasional dry cough.  Reports a bowel movement yesterday.   Assessment/Plan:  Acute Hypoxic Resp. Failure/Pneumonia due to COVID-19  COVID-19 Labs  Recent Labs    01/10/19 0100 01/11/19 0118 01/12/19 0110  DDIMER 0.74* 0.75* 0.60*  FERRITIN 483*  --   --   CRP 3.2* 11.7* 10.3*    Positive COVID-19 test results from 9/29 on care everywhere.  Fever: Afebrile Oxygen requirements: Nasal cannula at 1 L.  Saturating in the mid 90s.   Antibacterials: None Remdesivir: Day 4 today Steroids: Dexamethasone 6 mg every 12 hours Diuretics: Not on a regular basis Actemra: Not given Vitamin C and Zinc: Continue DVT Prophylaxis:  Lovenox 40  mg daily Convalescent plasma: Transfused on 10/12  Patient's respiratory status is stable.  Yesterday she was taken off of oxygen but back on 1 L of oxygen this morning.  Continue Remdesivir and steroids.  Yesterday her CRP increased significantly to 11.7.  Steroid dose was increased.  CRP improved to 10.3 today.  Due to her history of diverticulitis in the recent past she is not considered a candidate for Actemra.  She was given convalescent plasma.  D-dimer 0.6.  Continue to trend inflammatory markers.  Continue incentive spirometry, mobilization and prone positioning.  Leukocytosis is due to steroids.  Mild hyponatremia Continue to monitor.  Constipation Patient did have a bowel movement yesterday.  Continue laxatives.  History of GERD Continue with PPI  Hypokalemia Repleted.  DVT Prophylaxis: Lovenox PUD Prophylaxis: Protonix Code Status: No code Family Communication: Discussed with the patient.  Discussed with her daughter yesterday. Disposition Plan: Mobilize.  Hopefully back home when improved.   Medications:  Scheduled: . dexamethasone (DECADRON) injection  6 mg Intravenous Q12H  . DULoxetine  60 mg Oral QHS  . enoxaparin (LOVENOX) injection  40 mg Subcutaneous Daily  . pantoprazole  40 mg Oral BID AC  . polyethylene glycol  17 g Oral BID  . saccharomyces boulardii  250 mg Oral Daily  . senna-docusate  2 tablet Oral QHS  . sodium chloride flush  3 mL Intravenous Q12H  . vitamin C  500 mg Oral Daily  . zinc sulfate  220 mg Oral Daily   Continuous: . sodium chloride    .  remdesivir 100 mg in NS 250 mL 100 mg (01/12/19 1012)   CWU:GQBVQX chloride, acetaminophen, albuterol, HYDROcodone-acetaminophen, lidocaine, ondansetron **OR** ondansetron (ZOFRAN) IV, sodium chloride flush   Objective:  Vital Signs  Vitals:   01/11/19 1923 01/11/19 2000 01/12/19 0400 01/12/19 0741  BP:  (!) 119/99 119/71 109/66  Pulse:  78 74 75  Resp:  18 18 18   Temp:  98 F (36.7 C) 98.1  F (36.7 C) 98.4 F (36.9 C)  TempSrc:  Oral Oral Oral  SpO2: 94% 93% 94% 90%  Weight:      Height:       No intake or output data in the 24 hours ending 01/12/19 1016 Filed Weights   01/09/19 0500  Weight: 73.7 kg    General appearance: Awake alert.  In no distress Resp: Not as tachypneic as yesterday.  Continues to have coarse breath sounds bilaterally.  Few crackles at the bases bilaterally.  No wheezing or rhonchi.   Cardio: S1-S2 is normal regular.  No S3-S4.  No rubs murmurs or bruit GI: Abdomen is soft.  Nontender nondistended.  Bowel sounds are present normal.  No masses organomegaly Extremities: No edema.  Full range of motion of lower extremities. Neurologic: Alert and oriented x3.  No focal neurological deficits.   Lab Results:  Data Reviewed: I have personally reviewed following labs and imaging studies  CBC: Recent Labs  Lab 01/08/19 1932 01/10/19 0100 01/11/19 0118 01/12/19 0110  WBC 12.5* 14.7* 14.1* 12.7*  NEUTROABS 11.4* 13.0* 12.3* 10.7*  HGB 15.4* 13.8 13.6 13.0  HCT 44.7 41.5 40.9 39.1  MCV 89.6 92.4 92.1 91.8  PLT 355 378 383 424*    Basic Metabolic Panel: Recent Labs  Lab 01/08/19 1932 01/10/19 0100 01/11/19 0118 01/12/19 0110  NA 134* 135 137 134*  K 3.4* 3.8 3.9 3.8  CL 100 102 103 103  CO2 22 22 22  21*  GLUCOSE 120* 79 81 101*  BUN 26* 27* 23 22  CREATININE 0.89 0.89 0.71 0.67  CALCIUM 9.2 9.2 9.1 8.6*  MG  --  1.9  --   --   PHOS  --  1.6*  --   --     GFR: Estimated Creatinine Clearance: 64.9 mL/min (by C-G formula based on SCr of 0.67 mg/dL).  Liver Function Tests: Recent Labs  Lab 01/08/19 1932 01/10/19 0100 01/11/19 0118 01/12/19 0110  AST 59* 36 24 17  ALT 41 38 30 25  ALKPHOS 84 65 67 67  BILITOT 0.5 0.8 0.5 0.6  PROT 7.3 6.6 6.4* 6.1*  ALBUMIN 3.1* 3.0* 2.9* 2.8*     Anemia Panel: Recent Labs    01/10/19 0100  FERRITIN 28*        Radiology Studies: No results found.     LOS: 3 days   Kenichi Cassada  [employer] on [email or web address]  01/12/2019, 10:16 AM

## 2019-01-12 NOTE — Progress Notes (Signed)
Daughter called, she is updated and all questions answered.

## 2019-01-12 NOTE — Evaluation (Signed)
Physical Therapy Evaluation Patient Details Name: April Larsen MRN: [id number] DOB: 03-14-1946 Today's Date: 01/12/2019   History of Present Illness  73 y.o. female with medical history significant for GERD, osteoarthritis, and recurrent diverticulitis status-post sigmoid colectomy, diagnosed with COVID-19 on 12/27/2018, presents to ED secondary to generalized weakness, worsening dyspnea, she was noted to be hypoxic, with COVID-19 for pneumonia on imaging, was admitted [facility]  Clinical Impression  The patient  Reports feeling better. Ambulated x 150' on RA with SPO2 dropping to 87%, #/4 dyspnea. Placed on 2 L for 150' with SPO2 93%. Patient should progress to Dc to home. Pt admitted with above diagnosis.   Pt currently with functional limitations due to the deficits listed below (see PT Problem List). Pt will benefit from skilled PT to increase their independence and safety with mobility to allow discharge to the venue listed below.       Follow Up Recommendations  none    Equipment Recommendations  None recommended by PT    Recommendations for Other Services       Precautions / Restrictions Precautions Precaution Comments: sat test for Dc      Mobility  Bed Mobility               General bed mobility comments: OOB  Transfers Overall transfer level: Independent                  Ambulation/Gait Ambulation/Gait assistance: Supervision Gait Distance (Feet): 300 Feet Assistive device: None Gait Pattern/deviations: Step-through pattern     General Gait Details: decreased  speed,  Stairs            Wheelchair Mobility    Modified Rankin (Stroke Patients Only)       Balance Overall balance assessment: No apparent balance deficits (not formally assessed)                                           Pertinent Vitals/Pain      Home Living Family/patient expects to be discharged to:: Private  residence Living Arrangements: Children Available Help at Discharge: Family Type of Home: House Home Access: Level entry     Home Layout: One level Home Equipment: None Additional Comments: son has special needs and is positive for virus    Prior Function Level of Independence: Independent               Hand Dominance        Extremity/Trunk Assessment   Upper Extremity Assessment Upper Extremity Assessment: Overall WFL for tasks assessed    Lower Extremity Assessment Lower Extremity Assessment: Overall WFL for tasks assessed    Cervical / Trunk Assessment Cervical / Trunk Assessment: Normal  Communication   Communication: No difficulties  Cognition Arousal/Alertness: Awake/alert Behavior During Therapy: WFL for tasks assessed/performed Overall Cognitive Status: Within Functional Limits for tasks assessed                                        General Comments      Exercises     Assessment/Plan    PT Assessment Patient needs continued PT services  PT Problem List Cardiopulmonary status limiting activity;Decreased activity tolerance       PT Treatment Interventions Gait training;Functional mobility training  PT Goals (Current goals can be found in the Care Plan section)  Acute Rehab PT Goals Patient Stated Goal: to be well PT Goal Formulation: With patient Time For Goal Achievement: 01/26/19 Potential to Achieve Goals: Good    Frequency Min 3X/week   Barriers to discharge        Co-evaluation               AM-PAC PT "6 Clicks" Mobility  Outcome Measure Help needed turning from your back to your side while in a flat bed without using bedrails?: None Help needed moving from lying on your back to sitting on the side of a flat bed without using bedrails?: None Help needed moving to and from a bed to a chair (including a wheelchair)?: None Help needed standing up from a chair using your arms (e.g., wheelchair or bedside  chair)?: None Help needed to walk in hospital room?: A Little Help needed climbing 3-5 steps with a railing? : A Little 6 Click Score: 22    End of Session Equipment Utilized During Treatment: Oxygen Activity Tolerance: Patient tolerated treatment well Patient left: in chair;with call bell/phone within reach Nurse Communication: Mobility status PT Visit Diagnosis: Difficulty in walking, not elsewhere classified (R26.2)    Time: 3568-6168 PT Time Calculation (min) (ACUTE ONLY): 32 min   Charges:   PT Evaluation $PT Eval Low Complexity: 1 Low PT Treatments $Gait Training: 8-22 mins        [place] Pager [phone] Office [phone]   Claretha Cooper 01/12/2019, 5:33 PM

## 2019-01-13 LAB — CBC WITH DIFFERENTIAL/PLATELET
Abs Immature Granulocytes: 0.37 10*3/uL — ABNORMAL HIGH (ref 0.00–0.07)
Basophils Absolute: 0.1 10*3/uL (ref 0.0–0.1)
Basophils Relative: 0 %
Eosinophils Absolute: 0.1 10*3/uL (ref 0.0–0.5)
Eosinophils Relative: 0 %
HCT: 39.2 % (ref 36.0–46.0)
Hemoglobin: 13.1 g/dL (ref 12.0–15.0)
Immature Granulocytes: 2 %
Lymphocytes Relative: 7 %
Lymphs Abs: 1.1 10*3/uL (ref 0.7–4.0)
MCH: 30.5 pg (ref 26.0–34.0)
MCHC: 33.4 g/dL (ref 30.0–36.0)
MCV: 91.4 fL (ref 80.0–100.0)
Monocytes Absolute: 1 10*3/uL (ref 0.1–1.0)
Monocytes Relative: 6 %
Neutro Abs: 13 10*3/uL — ABNORMAL HIGH (ref 1.7–7.7)
Neutrophils Relative %: 85 %
Platelets: 446 10*3/uL — ABNORMAL HIGH (ref 150–400)
RBC: 4.29 MIL/uL (ref 3.87–5.11)
RDW: 13.2 % (ref 11.5–15.5)
WBC: 15.5 10*3/uL — ABNORMAL HIGH (ref 4.0–10.5)
nRBC: 0 % (ref 0.0–0.2)

## 2019-01-13 LAB — C-REACTIVE PROTEIN: CRP: 5.9 mg/dL — ABNORMAL HIGH (ref <1.0)

## 2019-01-13 LAB — COMPREHENSIVE METABOLIC PANEL
ALT: 22 U/L (ref 0–44)
AST: 16 U/L (ref 15–41)
Albumin: 2.6 g/dL — ABNORMAL LOW (ref 3.5–5.0)
Alkaline Phosphatase: 62 U/L (ref 38–126)
Anion gap: 10 (ref 5–15)
BUN: 28 mg/dL — ABNORMAL HIGH (ref 8–23)
CO2: 24 mmol/L (ref 22–32)
Calcium: 8.8 mg/dL — ABNORMAL LOW (ref 8.9–10.3)
Chloride: 102 mmol/L (ref 98–111)
Creatinine, Ser: 0.76 mg/dL (ref 0.44–1.00)
GFR calc Af Amer: >60 mL/min (ref >60)
GFR calc non Af Amer: >60 mL/min (ref >60)
Glucose, Bld: 98 mg/dL (ref 70–99)
Potassium: 4.2 mmol/L (ref 3.5–5.1)
Sodium: 136 mmol/L (ref 135–145)
Total Bilirubin: 0.5 mg/dL (ref 0.3–1.2)
Total Protein: 6 g/dL — ABNORMAL LOW (ref 6.5–8.1)

## 2019-01-13 LAB — D-DIMER, QUANTITATIVE: D-Dimer, Quant: 0.58 ug/mL-FEU — ABNORMAL HIGH (ref 0.00–0.50)

## 2019-01-13 LAB — PHOSPHORUS: Phosphorus: 3.2 mg/dL (ref 2.5–4.6)

## 2019-01-13 NOTE — Progress Notes (Signed)
Daughter is called and updated. All questions answered.

## 2019-01-13 NOTE — Progress Notes (Signed)
PROGRESS NOTE  April Larsen BLT:[id number] DOB: 03-28-1946 DOA: 01/09/2019  PCP: Rusty Aus, MD  Brief History/Interval Summary: 73 y.o.femalewith medical history significant forGERD, osteoarthritis, and recurrent diverticulitis status-post sigmoid colectomy, diagnosed with COVID-19 on 12/27/2018, presents to ED secondary to generalized weakness, worsening dyspnea, she was noted to be hypoxic, with COVID-19 for pneumonia on imaging, was admitted to [facility] for further management.  Reason for Visit: Acute respiratory failure with hypoxia.  Pneumonia due to COVID-19.  Consultants: None  Procedures: None  Antibiotics: Anti-infectives (From admission, onward)   Start     Dose/Rate Route Frequency Ordered Stop   01/10/19 1000  remdesivir 100 mg in sodium chloride 0.9 % 250 mL IVPB     100 mg 500 mL/hr over 30 Minutes Intravenous Every 24 hours 01/09/19 0543 01/13/19 0905   01/09/19 0630  remdesivir 200 mg in sodium chloride 0.9 % 250 mL IVPB     200 mg 500 mL/hr over 30 Minutes Intravenous Once 01/09/19 0543 01/09/19 0725      Subjective/Interval History: Patient states that she is feeling better.  Not coughing as much.  Still feels a little fatigued at times.  She did ambulate with physical therapy yesterday.  Has been off of oxygen since last night.     Assessment/Plan:  Acute Hypoxic Resp. Failure/Pneumonia due to COVID-19  COVID-19 Labs  Recent Labs    01/11/19 0118 01/12/19 0110 01/13/19 0041  DDIMER 0.75* 0.60* 0.58*  CRP 11.7* 10.3* 5.9*    Positive COVID-19 test results from 9/29 on care everywhere.  Fever: Remains afebrile Oxygen requirements: Room air this morning.  Saturating in the early 90s.  She did drop into the 80s with ambulation yesterday.   Antibacterials: None Remdesivir: Day 5 today Steroids: Dexamethasone 6 mg every 12 hours Diuretics: Not on a regular basis Actemra: Not given Vitamin C and Zinc: Continue DVT Prophylaxis:  Lovenox 40  mg daily Convalescent plasma: Transfused on 10/12  Patient's respiratory status appears to be slowly improving.  She is saturating normal on room air but tends to drop into the 80s with ambulation.  She will likely benefit from home oxygen when ready for discharge.  She will complete course of Remdesivir today.  She remains on steroids.  Her inflammatory markers has improved this morning.  Continue higher dose of steroids for today.  Due to history of diverticulitis in the past she was not considered a candidate for Actemra.  She was given convalescent plasma.  D-dimer 0.58.  Leukocytosis is due to steroids.  Mild hyponatremia Likely due to mild hypovolemia.  Resolved.  Constipation Resolved with laxatives.  History of GERD Continue with PPI  Hypokalemia Repleted.  DVT Prophylaxis: Lovenox PUD Prophylaxis: Protonix Code Status: No code Family Communication: Discussed with patient.  Daughter being updated on a daily basis. Disposition Plan: Anticipate discharge tomorrow with home oxygen.   Medications:  Scheduled: . dexamethasone (DECADRON) injection  6 mg Intravenous Q12H  . DULoxetine  60 mg Oral QHS  . enoxaparin (LOVENOX) injection  40 mg Subcutaneous Daily  . pantoprazole  40 mg Oral BID AC  . polyethylene glycol  17 g Oral BID  . saccharomyces boulardii  250 mg Oral Daily  . senna-docusate  2 tablet Oral QHS  . sodium chloride flush  3 mL Intravenous Q12H  . vitamin C  500 mg Oral Daily  . zinc sulfate  220 mg Oral Daily   Continuous: . sodium chloride     AQT:MAUQJF chloride, acetaminophen,  albuterol, HYDROcodone-acetaminophen, lidocaine, ondansetron **OR** ondansetron (ZOFRAN) IV, sodium chloride, sodium chloride flush   Objective:  Vital Signs  Vitals:   01/12/19 1551 01/12/19 2030 01/13/19 0348 01/13/19 0815  BP: 99/63 100/69 109/61 106/63  Pulse: 84 78 71 83  Resp: 16 16 18 18   Temp: 98.5 F (36.9 C) 98.6 F (37 C) 98.1 F (36.7 C) 97.8 F (36.6 C)   TempSrc: Oral Oral Oral Oral  SpO2: 94% 95% 90% 95%  Weight:      Height:        Intake/Output Summary (Last 24 hours) at 01/13/2019 0939 Last data filed at 01/12/2019 1800 Gross per 24 hour  Intake 730 ml  Output -  Net 730 ml   Filed Weights   01/09/19 0500  Weight: 73.7 kg    General appearance: Awake alert.  In no distress Resp: Normal effort at rest.  Coarse breath sound bilaterally with crackles bilateral bases.  No wheezing or rhonchi.   Cardio: S1-S2 is normal regular.  No S3-S4.  No rubs murmurs or bruit GI: Abdomen is soft.  Nontender nondistended.  Bowel sounds are present normal.  No masses organomegaly Extremities: No edema.  Full range of motion of lower extremities. Neurologic: Alert and oriented x3.  No focal neurological deficits.    Lab Results:  Data Reviewed: I have personally reviewed following labs and imaging studies  CBC: Recent Labs  Lab 01/08/19 1932 01/10/19 0100 01/11/19 0118 01/12/19 0110 01/13/19 0041  WBC 12.5* 14.7* 14.1* 12.7* 15.5*  NEUTROABS 11.4* 13.0* 12.3* 10.7* 13.0*  HGB 15.4* 13.8 13.6 13.0 13.1  HCT 44.7 41.5 40.9 39.1 39.2  MCV 89.6 92.4 92.1 91.8 91.4  PLT 355 378 383 424* 446*    Basic Metabolic Panel: Recent Labs  Lab 01/08/19 1932 01/10/19 0100 01/11/19 0118 01/12/19 0110 01/13/19 0041  NA 134* 135 137 134* 136  K 3.4* 3.8 3.9 3.8 4.2  CL 100 102 103 103 102  CO2 22 22 22  21* 24  GLUCOSE 120* 79 81 101* 98  BUN 26* 27* 23 22 28*  CREATININE 0.89 0.89 0.71 0.67 0.76  CALCIUM 9.2 9.2 9.1 8.6* 8.8*  MG  --  1.9  --   --   --   PHOS  --  1.6*  --   --  3.2    GFR: Estimated Creatinine Clearance: 64.9 mL/min (by C-G formula based on SCr of 0.76 mg/dL).  Liver Function Tests: Recent Labs  Lab 01/08/19 1932 01/10/19 0100 01/11/19 0118 01/12/19 0110 01/13/19 0041  AST 59* 36 24 17 16   ALT 41 38 30 25 22   ALKPHOS 84 65 67 67 62  BILITOT 0.5 0.8 0.5 0.6 0.5  PROT 7.3 6.6 6.4* 6.1* 6.0*  ALBUMIN  3.1* 3.0* 2.9* 2.8* 2.6*      Radiology Studies: No results found.     LOS: 4 days   April Larsen [employer] on [email or web address]  01/13/2019, 9:39 AM

## 2019-01-13 NOTE — Evaluation (Signed)
Occupational Therapy Evaluation Patient Details Name: April Larsen MRN: [id number] DOB: 04-24-1945 Today's Date: 01/13/2019    History of Present Illness 73 y.o. female with medical history significant for GERD, osteoarthritis, and recurrent diverticulitis status-post sigmoid colectomy, diagnosed with COVID-19 on 12/27/2018, presents to ED secondary to generalized weakness, worsening dyspnea, she was noted to be hypoxic, with COVID-19 for pneumonia on imaging, was admitted [facility] for further management   Clinical Impression   This 73 y/o female presents with the above. PTA pt reports independence with ADL and functional mobility. Pt performing functional mobility during session without AD at supervision level. Pt on RA with SpO2 >92% throughout activity. Pt performing seated ADL at mod independent level, requires supervision-minguard assist for LB and standing ADL. Pt reports plans to return home with son and reports daughter also plans to assist PRN. She will benefit from continued acute OT services to further maximize her safety and independence with ADL and mobility. Will follow.     Follow Up Recommendations  No OT follow up;Supervision/Assistance - 24 hour    Equipment Recommendations  None recommended by OT(pt's DME needs are met)           Precautions / Restrictions Precautions Precautions: Fall Restrictions Weight Bearing Restrictions: No      Mobility Bed Mobility               General bed mobility comments: OOB  Transfers Overall transfer level: Independent Equipment used: None                  Balance Overall balance assessment: No apparent balance deficits (not formally assessed)                                         ADL either performed or assessed with clinical judgement   ADL Overall ADL's : Needs assistance/impaired Eating/Feeding: Modified independent;Sitting   Grooming: Brushing hair;Modified  independent;Sitting Grooming Details (indicate cue type and reason): seated on BSC in bathroom, using shampoo cap as well Upper Body Bathing: Sitting;Modified independent   Lower Body Bathing: Min guard;Sit to/from stand   Upper Body Dressing : Sitting;Modified independent   Lower Body Dressing: Min guard;Sit to/from stand   Toilet Transfer: Supervision/safety;Ambulation   Toileting- Clothing Manipulation and Hygiene: Min guard;Sit to/from Nurse, children's Details (indicate cue type and reason): discussed use of shower chair initially during bathing ADL for energy conservation and pt in agreement Functional mobility during ADLs: Supervision/safety General ADL Comments: pt with decreased activity tolerance - issued EC handout and reviewed during session as well as activity progression after return home                         Pertinent Vitals/Pain Pain Assessment: No/denies pain     Hand Dominance     Extremity/Trunk Assessment Upper Extremity Assessment Upper Extremity Assessment: Overall WFL for tasks assessed   Lower Extremity Assessment Lower Extremity Assessment: Overall WFL for tasks assessed;Defer to PT evaluation   Cervical / Trunk Assessment Cervical / Trunk Assessment: Normal   Communication Communication Communication: No difficulties   Cognition Arousal/Alertness: Awake/alert Behavior During Therapy: WFL for tasks assessed/performed Overall Cognitive Status: Within Functional Limits for tasks assessed  General Comments       Exercises     Shoulder Instructions      Home Living Family/patient expects to be discharged to:: Private residence Living Arrangements: Children Available Help at Discharge: Family Type of Home: House Home Access: Level entry     Home Layout: One level     Bathroom Shower/Tub: Tub/shower unit         Home Equipment: Shower seat;Bedside  commode;Walker - 2 wheels;Transport chair   Additional Comments: son has special needs and is positive for virus      Prior Functioning/Environment Level of Independence: Independent                 OT Problem List: Decreased activity tolerance;Decreased knowledge of use of DME or AE;Cardiopulmonary status limiting activity;Decreased strength      OT Treatment/Interventions: Self-care/ADL training;Therapeutic exercise;Energy conservation;DME and/or AE instruction;Therapeutic activities;Patient/family education;Balance training    OT Goals(Current goals can be found in the care plan section) Acute Rehab OT Goals Patient Stated Goal: home soon OT Goal Formulation: With patient Time For Goal Achievement: 01/27/19 Potential to Achieve Goals: Good  OT Frequency: Min 3X/week   Barriers to D/C:            Co-evaluation              AM-PAC OT "6 Clicks" Daily Activity     Outcome Measure Help from another person eating meals?: None Help from another person taking care of personal grooming?: None Help from another person toileting, which includes using toliet, bedpan, or urinal?: None Help from another person bathing (including washing, rinsing, drying)?: A Little Help from another person to put on and taking off regular upper body clothing?: None Help from another person to put on and taking off regular lower body clothing?: A Little 6 Click Score: 22   End of Session Nurse Communication: Mobility status  Activity Tolerance: Patient tolerated treatment well Patient left: in chair;with call bell/phone within reach  OT Visit Diagnosis: Other (comment)(decreased activity tolerance)                Time: 0768-0881 OT Time Calculation (min): 29 min Charges:  OT General Charges $OT Visit: 1 Visit OT Evaluation $OT Eval Moderate Complexity: 1 Mod OT Treatments $Self Care/Home Management : 8-22 mins  Lou Cal, OT [facility] Pager  [phone] Office [phone]   Raymondo Band 01/13/2019, 1:35 PM

## 2019-01-13 NOTE — Progress Notes (Signed)
SATURATION QUALIFICATIONS: (This note is used to comply with regulatory documentation for home oxygen)  Patient Saturations on Room Air at Rest = 95%  Patient Saturations on Room Air while Ambulating = 91%    Pt walked around unit on RA. Pt stayed at 90-91% most of the walk. Pt did drip to 88% at one time. Pt stopped, and recovered within 5 seconds to 91%. Do not feel pt will need oxygen in the home.

## 2019-01-14 LAB — C-REACTIVE PROTEIN: CRP: 2.9 mg/dL — ABNORMAL HIGH (ref <1.0)

## 2019-01-14 LAB — COMPREHENSIVE METABOLIC PANEL
ALT: 23 U/L (ref 0–44)
AST: 16 U/L (ref 15–41)
Albumin: 2.7 g/dL — ABNORMAL LOW (ref 3.5–5.0)
Alkaline Phosphatase: 67 U/L (ref 38–126)
Anion gap: 12 (ref 5–15)
BUN: 24 mg/dL — ABNORMAL HIGH (ref 8–23)
CO2: 20 mmol/L — ABNORMAL LOW (ref 22–32)
Calcium: 8.5 mg/dL — ABNORMAL LOW (ref 8.9–10.3)
Chloride: 104 mmol/L (ref 98–111)
Creatinine, Ser: 0.78 mg/dL (ref 0.44–1.00)
GFR calc Af Amer: >60 mL/min (ref >60)
GFR calc non Af Amer: >60 mL/min (ref >60)
Glucose, Bld: 159 mg/dL — ABNORMAL HIGH (ref 70–99)
Potassium: 4 mmol/L (ref 3.5–5.1)
Sodium: 136 mmol/L (ref 135–145)
Total Bilirubin: 0.6 mg/dL (ref 0.3–1.2)
Total Protein: 5.9 g/dL — ABNORMAL LOW (ref 6.5–8.1)

## 2019-01-14 LAB — CBC WITH DIFFERENTIAL/PLATELET
Abs Immature Granulocytes: 0.39 10*3/uL — ABNORMAL HIGH (ref 0.00–0.07)
Basophils Absolute: 0.1 10*3/uL (ref 0.0–0.1)
Basophils Relative: 0 %
Eosinophils Absolute: 0 10*3/uL (ref 0.0–0.5)
Eosinophils Relative: 0 %
HCT: 38.8 % (ref 36.0–46.0)
Hemoglobin: 12.9 g/dL (ref 12.0–15.0)
Immature Granulocytes: 3 %
Lymphocytes Relative: 5 %
Lymphs Abs: 0.7 10*3/uL (ref 0.7–4.0)
MCH: 30.4 pg (ref 26.0–34.0)
MCHC: 33.2 g/dL (ref 30.0–36.0)
MCV: 91.5 fL (ref 80.0–100.0)
Monocytes Absolute: 0.7 10*3/uL (ref 0.1–1.0)
Monocytes Relative: 5 %
Neutro Abs: 12.3 10*3/uL — ABNORMAL HIGH (ref 1.7–7.7)
Neutrophils Relative %: 87 %
Platelets: 477 10*3/uL — ABNORMAL HIGH (ref 150–400)
RBC: 4.24 MIL/uL (ref 3.87–5.11)
RDW: 13.2 % (ref 11.5–15.5)
WBC: 14.2 10*3/uL — ABNORMAL HIGH (ref 4.0–10.5)
nRBC: 0 % (ref 0.0–0.2)

## 2019-01-14 MED ORDER — ZINC SULFATE 220 (50 ZN) MG PO CAPS: 220.0000 mg | ORAL_CAPSULE | Freq: Every day | ORAL | 0 refills | Status: AC

## 2019-01-14 MED ORDER — POLYETHYLENE GLYCOL 3350 17 G PO PACK: 17.0000 g | PACK | Freq: Every day | ORAL | 0 refills | Status: DC

## 2019-01-14 MED ORDER — ASCORBIC ACID 500 MG PO TABS: 500.0000 mg | ORAL_TABLET | Freq: Every day | ORAL | 0 refills | Status: AC

## 2019-01-14 MED ORDER — DEXAMETHASONE 2 MG PO TABS: ORAL_TABLET | ORAL | 0 refills | Status: DC

## 2019-01-14 NOTE — Discharge Instructions (Signed)

## 2019-01-14 NOTE — Progress Notes (Signed)
Discharged home via [facility] A/Ox4, VSS on RA, all teaching complete and all questions answered.

## 2019-01-14 NOTE — TOC Transition Note (Signed)
Transition of Care [facility]) - CM/SW Discharge Note   Patient Details  Name: April Larsen MRN: [id number] Date of Birth: 1946-03-14  Transition of Care [facility]) CM/SW Contact:  Ninfa Meeker, RN Phone Number: [phone] (working remotely) 01/14/2019, 10:17 AM   Clinical Narrative:  Patient is a 73 y.o. female with medical history significant for GERD, osteoarthritis, and recurrent diverticulitis status-post sigmoid colectomy, diagnosed with COVID-19 on 12/27/2018, and now presenting to the ED for evaluation of progressive malaise with lightheadedness and shortness of breath despite treatment with steroids and amoxicillin. Patient was admitted and treated for COVID 19 symptoms. Will discharge Home today. Case manager was contacted to arrange PTAR transport home for patient. PTAR scheduled for 11am pickup. Case manager printed medical necessity and face sheet to unit. Notified charge nurse of scheduled transport.   Final next level of care: Home/Self Care Barriers to Discharge: No Barriers Identified   Patient Goals and CMS Choice     Choice offered to / list presented to : NA  Discharge Placement                       Discharge Plan and Services In-house Referral: NA Discharge Planning Services: CM Consult Post Acute Care Choice: NA          DME Arranged: N/A DME Agency: NA       HH Arranged: NA          Social Determinants of Health (SDOH) Interventions     Readmission Risk Interventions No flowsheet data found.

## 2019-01-14 NOTE — Discharge Summary (Signed)
[facility]  Physician Discharge Summary   Patient ID: April Larsen MRN: [id number] DOB/AGE: 09/13/1945 73 y.o.  Admit date: 01/09/2019 Discharge date: 01/14/2019  PCP: Rusty Aus, MD  DISCHARGE DIAGNOSES:  Pneumonia due to COVID-19 Acute respiratory failure with hypoxia, resolved Constipation, resolved History of GERD  RECOMMENDATIONS FOR OUTPATIENT FOLLOW UP: 1. Outpatient follow-up with PCP    Home Health: None Equipment/Devices: None  CODE STATUS: Full code  DISCHARGE CONDITION: fair  Diet recommendation: As before  INITIAL HISTORY: 73 y.o.femalewith medical history significant forGERD, osteoarthritis, and recurrent diverticulitis status-post sigmoid colectomy, diagnosed with COVID-19 on 12/27/2018, presents to ED secondary to generalized weakness, worsening dyspnea, she was noted to be hypoxic, with COVID-19 for pneumonia on imaging, was admitted to [facility] for further management.   HOSPITAL COURSE:    Acute Hypoxic Resp. Failure/Pneumonia due to COVID-19 Patient was hospitalized.  Patient was started on remdesivir and steroids.  Patient was also given convalescent plasma.  She started improving.  Her inflammatory markers have improved.  She is saturating normally on room air.  She still fatigued but has been able to ambulate.  Will be discharged on tapering doses of steroids.  Considered stable for discharge.  D-dimer 0.58 today.  Leukocytosis is due to steroids.mer 0.58.  Leukocytosis is due to steroids.  Mild hyponatremia Likely due to mild hypovolemia.  Resolved.  Constipation Resolved with laxatives.  History of GERD Continue with PPI  Hypokalemia Repleted.  Overall stable.  Okay for discharge home today.     PERTINENT LABS:  The results of significant diagnostics from this hospitalization (including imaging, microbiology, ancillary and laboratory) are listed below for reference.      Labs:  COVID-19 Labs  Recent  Labs    01/12/19 0110 01/13/19 0041 01/14/19 0106  DDIMER 0.60* 0.58*  --   CRP 10.3* 5.9* 2.9*   Positive COVID-19 test results from 9/29 on care everywhere.    Basic Metabolic Panel: Recent Labs  Lab 01/10/19 0100 01/11/19 0118 01/12/19 0110 01/13/19 0041 01/14/19 0106  NA 135 137 134* 136 136  K 3.8 3.9 3.8 4.2 4.0  CL 102 103 103 102 104  CO2 22 22 21* 24 20*  GLUCOSE 79 81 101* 98 159*  BUN 27* 23 22 28* 24*  CREATININE 0.89 0.71 0.67 0.76 0.78  CALCIUM 9.2 9.1 8.6* 8.8* 8.5*  MG 1.9  --   --   --   --   PHOS 1.6*  --   --  3.2  --    Liver Function Tests: Recent Labs  Lab 01/10/19 0100 01/11/19 0118 01/12/19 0110 01/13/19 0041 01/14/19 0106  AST 36 24 17 16 16   ALT 38 30 25 22 23   ALKPHOS 65 67 67 62 67  BILITOT 0.8 0.5 0.6 0.5 0.6  PROT 6.6 6.4* 6.1* 6.0* 5.9*  ALBUMIN 3.0* 2.9* 2.8* 2.6* 2.7*   CBC: Recent Labs  Lab 01/10/19 0100 01/11/19 0118 01/12/19 0110 01/13/19 0041 01/14/19 0106  WBC 14.7* 14.1* 12.7* 15.5* 14.2*  NEUTROABS 13.0* 12.3* 10.7* 13.0* 12.3*  HGB 13.8 13.6 13.0 13.1 12.9  HCT 41.5 40.9 39.1 39.2 38.8  MCV 92.4 92.1 91.8 91.4 91.5  PLT 378 383 424* 446* 477*     IMAGING STUDIES Dg Chest Portable 1 View  Result Date: 01/08/2019 CLINICAL DATA:  Cough, shortness of breath, and weakness. COVID-19 positive. EXAM: PORTABLE CHEST 1 VIEW COMPARISON:  Chest x-ray dated December 12, 2008. FINDINGS: Normal heart size. Asymmetric left mid and  right lower lung interstitial thickening with patchy airspace disease. No pleural effusion or pneumothorax. No acute osseous abnormality. Unchanged large hiatal hernia. IMPRESSION: 1. Bilateral asymmetric interstitial thickening and patchy airspace disease, consistent with COVID-19 pneumonia. 2. Large hiatal hernia. Electronically Signed   By: Titus Dubin M.D.   On: 01/08/2019 19:47    DISCHARGE EXAMINATION: Vitals:   01/13/19 1938 01/14/19 0319 01/14/19 0739 01/14/19 1055  BP: 120/69  114/70 120/77 122/74  Pulse: 75 74 79 80  Resp: 18 17 18 17   Temp: 98.3 F (36.8 C) 97.8 F (36.6 C) 98.6 F (37 C) 98.6 F (37 C)  TempSrc: Oral Oral Oral Oral  SpO2: 94% 94% 94% 95%  Weight:      Height:       General appearance: Awake alert.  In no distress Resp: Normal effort at rest.  Coarse breath sound bilaterally with crackles bilateral bases.  No wheezing or rhonchi. Cardio: S1-S2 is normal regular.  No S3-S4.  No rubs murmurs or bruit GI: Abdomen is soft.  Nontender nondistended.  Bowel sounds are present normal.  No masses organomegaly Extremities: No edema.  Full range of motion of lower extremities. Neurologic: Alert and oriented x3.  No focal neurological deficits.    DISPOSITION: Home  Discharge Instructions    Call MD for:  difficulty breathing, headache or visual disturbances   Complete by: As directed    Call MD for:  extreme fatigue   Complete by: As directed    Call MD for:  persistant dizziness or light-headedness   Complete by: As directed    Call MD for:  persistant nausea and vomiting   Complete by: As directed    Call MD for:  severe uncontrolled pain   Complete by: As directed    Call MD for:  temperature >100.4   Complete by: As directed    Discharge instructions   Complete by: As directed    COVID 19 INSTRUCTIONS  - You are felt to be stable enough to no longer require inpatient monitoring, testing, and treatment, though you will need to follow the recommendations below: - Based on the CDC's non-test criteria for ending self-isolation: You may not return to work/leave the home until at least 21 days since symptom onset AND 3 days without a fever (without taking tylenol, ibuprofen, etc.) AND have improvement in respiratory symptoms. - Do not take NSAID medications (including, but not limited to, ibuprofen, advil, motrin, naproxen, aleve, goody's powder, etc.) - Follow up with your doctor in the next week via telehealth or seek medical attention  right away if your symptoms get WORSE.  - Consider donating plasma after you have recovered (either 14 days after a negative test or 28 days after symptoms have completely resolved) because your antibodies to this virus may be helpful to give to others with life-threatening infections. Please go to the website www.[email or web address] if you would like to consider volunteering for plasma donation.    Directions for you at home:  Wear a facemask You should wear a facemask that covers your nose and mouth when you are in the same room with other people and when you visit a healthcare provider. People who live with or visit you should also wear a facemask while they are in the same room with you.  Separate yourself from other people in your home As much as possible, you should stay in a different room from other people in your home. Also, you should use a separate bathroom, if  available.  Avoid sharing household items You should not share dishes, drinking glasses, cups, eating utensils, towels, bedding, or other items with other people in your home. After using these items, you should wash them thoroughly with soap and water.  Cover your coughs and sneezes Cover your mouth and nose with a tissue when you cough or sneeze, or you can cough or sneeze into your sleeve. Throw used tissues in a lined trash can, and immediately wash your hands with soap and water for at least 20 seconds or use an alcohol-based hand rub.  Wash your [facility] your hands often and thoroughly with soap and water for at least 20 seconds. You can use an alcohol-based hand sanitizer if soap and water are not available and if your hands are not visibly dirty. Avoid touching your eyes, nose, and mouth with unwashed hands.  Directions for those who live with, or provide care at home for you:  Limit the number of people who have contact with the patient If possible, have only one caregiver for the patient. Other household members  should stay in another home or place of residence. If this is not possible, they should stay in another room, or be separated from the patient as much as possible. Use a separate bathroom, if available. Restrict visitors who do not have an essential need to be in the home.  Ensure good ventilation Make sure that shared spaces in the home have good air flow, such as from an air conditioner or an opened window, weather permitting.  Wash your hands often Wash your hands often and thoroughly with soap and water for at least 20 seconds. You can use an alcohol based hand sanitizer if soap and water are not available and if your hands are not visibly dirty. Avoid touching your eyes, nose, and mouth with unwashed hands. Use disposable paper towels to dry your hands. If not available, use dedicated cloth towels and replace them when they become wet.  Wear a facemask and gloves Wear a disposable facemask at all times in the room and gloves when you touch or have contact with the patient's blood, body fluids, and/or secretions or excretions, such as sweat, saliva, sputum, nasal mucus, vomit, urine, or feces.  Ensure the mask fits over your nose and mouth tightly, and do not touch it during use. Throw out disposable facemasks and gloves after using them. Do not reuse. Wash your hands immediately after removing your facemask and gloves. If your personal clothing becomes contaminated, carefully remove clothing and launder. Wash your hands after handling contaminated clothing. Place all used disposable facemasks, gloves, and other waste in a lined container before disposing them with other household waste. Remove gloves and wash your hands immediately after handling these items.  Do not share dishes, glasses, or other household items with the patient Avoid sharing household items. You should not share dishes, drinking glasses, cups, eating utensils, towels, bedding, or other items with a patient who is  confirmed to have, or being evaluated for, COVID-19 infection. After the person uses these items, you should wash them thoroughly with soap and water.  Wash laundry thoroughly Immediately remove and wash clothes or bedding that have blood, body fluids, and/or secretions or excretions, such as sweat, saliva, sputum, nasal mucus, vomit, urine, or feces, on them. Wear gloves when handling laundry from the patient. Read and follow directions on labels of laundry or clothing items and detergent. In general, wash and dry with the warmest temperatures recommended on  the label.  Clean all areas the individual has used often Clean all touchable surfaces, such as counters, tabletops, doorknobs, bathroom fixtures, toilets, phones, keyboards, tablets, and bedside tables, every day. Also, clean any surfaces that may have blood, body fluids, and/or secretions or excretions on them. Wear gloves when cleaning surfaces the patient has come in contact with. Use a diluted bleach solution (e.g., dilute bleach with 1 part bleach and 10 parts water) or a household disinfectant with a label that says EPA-registered for coronaviruses. To make a bleach solution at home, add 1 tablespoon of bleach to 1 quart (4 cups) of water. For a larger supply, add  cup of bleach to 1 gallon (16 cups) of water. Read labels of cleaning products and follow recommendations provided on product labels. Labels contain instructions for safe and effective use of the cleaning product including precautions you should take when applying the product, such as wearing gloves or eye protection and making sure you have good ventilation during use of the product. Remove gloves and wash hands immediately after cleaning.  Monitor yourself for signs and symptoms of illness Caregivers and household members are considered close contacts, should monitor their health, and will be asked to limit movement outside of the home to the extent possible. Follow the  monitoring steps for close contacts listed on the symptom monitoring form.   If you have additional questions, contact your local health department or call the epidemiologist on call at [phone] (available 24/7). This guidance is subject to change. For the most up-to-date guidance from [facility], please refer to their website: YouBlogs.pl   You were cared for by a hospitalist during your hospital stay. If you have any questions about your discharge medications or the care you received while you were in the hospital after you are discharged, you can call the unit and asked to speak with the hospitalist on call if the hospitalist that took care of you is not available. Once you are discharged, your primary care physician will handle any further medical issues. Please note that NO REFILLS for any discharge medications will be authorized once you are discharged, as it is imperative that you return to your primary care physician (or establish a relationship with a primary care physician if you do not have one) for your aftercare needs so that they can reassess your need for medications and monitor your lab values. If you do not have a primary care physician, you can call [phone] for a physician referral.   Increase activity slowly   Complete by: As directed    MyChart COVID-19 home monitoring program   Complete by: Jan 14, 2019    Is the patient willing to use the [facility] for home monitoring?: Yes   Temperature monitoring   Complete by: Jan 14, 2019    After how many days would you like to receive a notification of this patient's flowsheet entries?: 1        Allergies as of 01/14/2019      Reactions   Sulfa Antibiotics Rash      Medication List    STOP taking these medications   amoxicillin-clavulanate 500-125 MG tablet Commonly known as: AUGMENTIN   predniSONE 20 MG tablet Commonly known as: DELTASONE     TAKE  these medications   ascorbic acid 500 MG tablet Commonly known as: VITAMIN C Take 1 tablet (500 mg total) by mouth daily for 10 days.   buPROPion 150 MG 24 hr tablet Commonly known as:  WELLBUTRIN XL Take 150 mg by mouth daily.   CALCIUM 600+D PO Take 1 tablet by mouth 2 (two) times daily.   cholecalciferol 1000 units tablet Commonly known as: VITAMIN D Take 2,000 Units by mouth daily.   dexamethasone 2 MG tablet Commonly known as: DECADRON Take 3 tablets once daily for 3 days, then 2 tablets once daily for 3 days, then 1 tablet once daily for 3 days, then STOP.   DULoxetine 60 MG capsule Commonly known as: CYMBALTA Take 60 mg by mouth at bedtime.   ESTRACE VAGINAL 0.1 MG/GM vaginal cream Generic drug: estradiol Place 1 application vaginally at bedtime as needed (itching).   ferrous sulfate 325 (65 FE) MG tablet Take 325 mg by mouth daily.   lidocaine 5 % Commonly known as: LIDODERM Place 1 patch onto the skin daily as needed (pain).   omeprazole 20 MG capsule Commonly known as: PRILOSEC Take 20 mg by mouth 2 (two) times daily.   ondansetron 4 MG tablet Commonly known as: ZOFRAN Take 4 mg by mouth 3 (three) times daily as needed for nausea.   polyethylene glycol 17 g packet Commonly known as: MIRALAX / GLYCOLAX Take 17 g by mouth daily.   Prevnar 13 Susp injection Generic drug: pneumococcal 13-valent conjugate vaccine ADM 0.5ML IM UTD   saccharomyces boulardii 250 MG capsule Commonly known as: FLORASTOR Take 250 mg by mouth daily.   sennosides-docusate sodium 8.6-50 MG tablet Commonly known as: SENOKOT-S Take 2 tablets by mouth at bedtime.   Shingrix injection Generic drug: Zoster Vaccine Adjuvanted ADM 0.5ML IM UTD   traMADol 50 MG tablet Commonly known as: ULTRAM Take 50 mg by mouth 3 (three) times daily.   TYLENOL 500 MG tablet Generic drug: acetaminophen Take 1,000 mg by mouth every 6 (six) hours as needed for moderate pain or headache.   zinc  sulfate 220 (50 Zn) MG capsule Take 1 capsule (220 mg total) by mouth daily for 10 days.        Follow-up Information    Rusty Aus, MD. Schedule an appointment as soon as possible for a visit in 1 week(s).   Specialty: Internal Medicine Contact information: [place] [address] [phone]           TOTAL DISCHARGE TIME: 43 minutes  [place]  [facility] Pager on [email or web address]  01/14/2019, 11:56 AM

## 2019-01-14 NOTE — Plan of Care (Signed)
  Problem: Education: Goal: Knowledge of risk factors and measures for prevention of condition will improve Outcome: Adequate for Discharge   Problem: Coping: Goal: Psychosocial and spiritual needs will be supported Outcome: Adequate for Discharge   Problem: Education: Goal: Knowledge of risk factors and measures for prevention of condition will improve Outcome: Adequate for Discharge   Problem: Coping: Goal: Psychosocial and spiritual needs will be supported Outcome: Adequate for Discharge   Problem: Respiratory: Goal: Will maintain a patent airway Outcome: Adequate for Discharge Goal: Complications related to the disease process, condition or treatment will be avoided or minimized Outcome: Adequate for Discharge   Problem: Acute Rehab PT Goals(only PT should resolve) Goal: Pt Will Ambulate Outcome: Adequate for Discharge Goal: Pt/caregiver will Perform Home Exercise Program Outcome: Adequate for Discharge   Problem: Acute Rehab OT Goals (only OT should resolve) Goal: Pt. Will Perform Grooming Outcome: Adequate for Discharge Goal: Pt. Will Perform Lower Body Dressing Outcome: Adequate for Discharge Goal: Pt. Will Transfer To Toilet Outcome: Adequate for Discharge Goal: Pt. Will Perform Toileting-Clothing Manipulation Outcome: Adequate for Discharge Goal: OT Additional ADL Goal #1 Outcome: Adequate for Discharge

## 2019-01-19 DIAGNOSIS — Z8616 Personal history of COVID-19: Secondary | ICD-10-CM | POA: Insufficient documentation

## 2019-01-19 DIAGNOSIS — U071 COVID-19: Secondary | ICD-10-CM | POA: Diagnosis not present

## 2019-01-19 DIAGNOSIS — E440 Moderate protein-calorie malnutrition: Secondary | ICD-10-CM | POA: Diagnosis not present

## 2019-01-19 DIAGNOSIS — J1289 Other viral pneumonia: Secondary | ICD-10-CM | POA: Diagnosis not present

## 2019-01-30 DIAGNOSIS — K449 Diaphragmatic hernia without obstruction or gangrene: Secondary | ICD-10-CM | POA: Diagnosis not present

## 2019-01-30 DIAGNOSIS — J984 Other disorders of lung: Secondary | ICD-10-CM | POA: Diagnosis not present

## 2019-01-30 DIAGNOSIS — U071 COVID-19: Secondary | ICD-10-CM | POA: Diagnosis not present

## 2019-01-30 DIAGNOSIS — E440 Moderate protein-calorie malnutrition: Secondary | ICD-10-CM | POA: Diagnosis not present

## 2019-01-30 DIAGNOSIS — J1289 Other viral pneumonia: Secondary | ICD-10-CM | POA: Diagnosis not present

## 2019-02-20 DIAGNOSIS — M25562 Pain in left knee: Secondary | ICD-10-CM | POA: Diagnosis not present

## 2019-02-20 DIAGNOSIS — M25561 Pain in right knee: Secondary | ICD-10-CM | POA: Diagnosis not present

## 2019-02-21 DIAGNOSIS — C44329 Squamous cell carcinoma of skin of other parts of face: Secondary | ICD-10-CM | POA: Diagnosis not present

## 2019-02-21 DIAGNOSIS — Z86007 Personal history of in-situ neoplasm of skin: Secondary | ICD-10-CM | POA: Diagnosis not present

## 2019-02-21 DIAGNOSIS — L304 Erythema intertrigo: Secondary | ICD-10-CM | POA: Diagnosis not present

## 2019-02-21 DIAGNOSIS — L570 Actinic keratosis: Secondary | ICD-10-CM | POA: Diagnosis not present

## 2019-02-21 DIAGNOSIS — D485 Neoplasm of uncertain behavior of skin: Secondary | ICD-10-CM | POA: Diagnosis not present

## 2019-02-21 DIAGNOSIS — L821 Other seborrheic keratosis: Secondary | ICD-10-CM | POA: Diagnosis not present

## 2019-02-21 DIAGNOSIS — L820 Inflamed seborrheic keratosis: Secondary | ICD-10-CM | POA: Diagnosis not present

## 2019-03-02 ENCOUNTER — Ambulatory Visit
Admission: RE | Admit: 2019-03-02 | Discharge: 2019-03-02 | Disposition: A | Discharge status: Home / Self Care | Payer: PPO | Source: Ambulatory Visit | Attending: Internal Medicine | Admitting: Internal Medicine

## 2019-03-02 DIAGNOSIS — Z1231 Encounter for screening mammogram for malignant neoplasm of breast: Secondary | ICD-10-CM | POA: Insufficient documentation

## 2019-03-07 DIAGNOSIS — C44329 Squamous cell carcinoma of skin of other parts of face: Secondary | ICD-10-CM | POA: Diagnosis not present

## 2019-03-13 DIAGNOSIS — J1289 Other viral pneumonia: Secondary | ICD-10-CM | POA: Diagnosis not present

## 2019-03-13 DIAGNOSIS — R0600 Dyspnea, unspecified: Secondary | ICD-10-CM | POA: Diagnosis not present

## 2019-03-13 DIAGNOSIS — U071 COVID-19: Secondary | ICD-10-CM | POA: Diagnosis not present

## 2019-03-17 DIAGNOSIS — C44329 Squamous cell carcinoma of skin of other parts of face: Secondary | ICD-10-CM | POA: Diagnosis not present

## 2019-04-07 DIAGNOSIS — H43813 Vitreous degeneration, bilateral: Secondary | ICD-10-CM | POA: Diagnosis not present

## 2019-04-13 DIAGNOSIS — M159 Polyosteoarthritis, unspecified: Secondary | ICD-10-CM | POA: Diagnosis not present

## 2019-04-13 DIAGNOSIS — M8589 Other specified disorders of bone density and structure, multiple sites: Secondary | ICD-10-CM | POA: Diagnosis not present

## 2019-04-13 DIAGNOSIS — M4127 Other idiopathic scoliosis, lumbosacral region: Secondary | ICD-10-CM | POA: Diagnosis not present

## 2019-05-05 DIAGNOSIS — M65342 Trigger finger, left ring finger: Secondary | ICD-10-CM | POA: Diagnosis not present

## 2019-05-05 DIAGNOSIS — M65331 Trigger finger, right middle finger: Secondary | ICD-10-CM | POA: Diagnosis not present

## 2019-05-21 ENCOUNTER — Ambulatory Visit: Payer: PPO | Attending: Internal Medicine

## 2019-05-21 DIAGNOSIS — Z23 Encounter for immunization: Secondary | ICD-10-CM

## 2019-05-21 NOTE — Progress Notes (Signed)
   Covid-19 Vaccination Clinic  Name:  April Larsen    MRN: [id number] DOB: 09-Nov-1945  05/21/2019  [name] was observed post Covid-19 immunization for 15 minutes without incidence. She was provided with Vaccine Information Sheet and instruction to access the V-Safe system.   [name] was instructed to call 911 with any severe reactions post vaccine: Marland Kitchen Difficulty breathing  . Swelling of your face and throat  . A fast heartbeat  . A bad rash all over your body  . Dizziness and weakness    Immunizations Administered    Name Date Dose VIS Date Route   Pfizer COVID-19 Vaccine 05/21/2019  9:52 AM 0.3 mL 03/10/2019 Intramuscular   Manufacturer: [place]   Lot: [id number]   [place]: [id number]

## 2019-06-01 DIAGNOSIS — Z85828 Personal history of other malignant neoplasm of skin: Secondary | ICD-10-CM | POA: Diagnosis not present

## 2019-06-01 DIAGNOSIS — L304 Erythema intertrigo: Secondary | ICD-10-CM | POA: Diagnosis not present

## 2019-06-01 DIAGNOSIS — D0472 Carcinoma in situ of skin of left lower limb, including hip: Secondary | ICD-10-CM | POA: Diagnosis not present

## 2019-06-01 DIAGNOSIS — B079 Viral wart, unspecified: Secondary | ICD-10-CM | POA: Diagnosis not present

## 2019-06-01 DIAGNOSIS — L570 Actinic keratosis: Secondary | ICD-10-CM | POA: Diagnosis not present

## 2019-06-01 DIAGNOSIS — L820 Inflamed seborrheic keratosis: Secondary | ICD-10-CM | POA: Diagnosis not present

## 2019-06-01 DIAGNOSIS — L578 Other skin changes due to chronic exposure to nonionizing radiation: Secondary | ICD-10-CM | POA: Diagnosis not present

## 2019-06-16 ENCOUNTER — Ambulatory Visit: Payer: PPO | Attending: Internal Medicine

## 2019-06-16 DIAGNOSIS — Z23 Encounter for immunization: Secondary | ICD-10-CM

## 2019-06-16 NOTE — Progress Notes (Signed)
   Covid-19 Vaccination Clinic  Name:  Ashlon Lottman    MRN: [id number] DOB: 1945-04-26  06/16/2019  Ms. Huneycutt was observed post Covid-19 immunization for 15 minutes without incident. She was provided with Vaccine Information Sheet and instruction to access the V-Safe system.   Ms. Grabski was instructed to call 911 with any severe reactions post vaccine: Marland Kitchen Difficulty breathing  . Swelling of face and throat  . A fast heartbeat  . A bad rash all over body  . Dizziness and weakness   Immunizations Administered    Name Date Dose VIS Date Route   Pfizer COVID-19 Vaccine 06/16/2019  9:35 AM 0.3 mL 03/10/2019 Intramuscular   Manufacturer: [place]   Lot: [id number]   [place]: [id number]

## 2019-06-20 ENCOUNTER — Ambulatory Visit: Payer: Self-pay

## 2019-06-26 DIAGNOSIS — M545 Low back pain: Secondary | ICD-10-CM | POA: Diagnosis not present

## 2019-06-26 DIAGNOSIS — M25561 Pain in right knee: Secondary | ICD-10-CM | POA: Diagnosis not present

## 2019-06-26 DIAGNOSIS — M1711 Unilateral primary osteoarthritis, right knee: Secondary | ICD-10-CM | POA: Diagnosis not present

## 2019-07-11 DIAGNOSIS — M19071 Primary osteoarthritis, right ankle and foot: Secondary | ICD-10-CM | POA: Diagnosis not present

## 2019-07-11 DIAGNOSIS — K5190 Ulcerative colitis, unspecified, without complications: Secondary | ICD-10-CM | POA: Diagnosis not present

## 2019-07-11 DIAGNOSIS — M79672 Pain in left foot: Secondary | ICD-10-CM | POA: Diagnosis not present

## 2019-07-11 DIAGNOSIS — M2042 Other hammer toe(s) (acquired), left foot: Secondary | ICD-10-CM | POA: Diagnosis not present

## 2019-07-12 DIAGNOSIS — E782 Mixed hyperlipidemia: Secondary | ICD-10-CM | POA: Diagnosis not present

## 2019-07-12 DIAGNOSIS — E538 Deficiency of other specified B group vitamins: Secondary | ICD-10-CM | POA: Diagnosis not present

## 2019-07-12 DIAGNOSIS — M4147 Neuromuscular scoliosis, lumbosacral region: Secondary | ICD-10-CM | POA: Diagnosis not present

## 2019-07-12 DIAGNOSIS — F330 Major depressive disorder, recurrent, mild: Secondary | ICD-10-CM | POA: Diagnosis not present

## 2019-07-12 DIAGNOSIS — R739 Hyperglycemia, unspecified: Secondary | ICD-10-CM | POA: Diagnosis not present

## 2019-07-12 DIAGNOSIS — K5190 Ulcerative colitis, unspecified, without complications: Secondary | ICD-10-CM | POA: Diagnosis not present

## 2019-09-11 ENCOUNTER — Other Ambulatory Visit: Payer: Self-pay

## 2019-09-11 ENCOUNTER — Ambulatory Visit: Payer: PPO | Admitting: Dermatology

## 2019-09-11 DIAGNOSIS — L570 Actinic keratosis: Secondary | ICD-10-CM

## 2019-09-11 DIAGNOSIS — L578 Other skin changes due to chronic exposure to nonionizing radiation: Secondary | ICD-10-CM

## 2019-09-11 DIAGNOSIS — Z85828 Personal history of other malignant neoplasm of skin: Secondary | ICD-10-CM | POA: Diagnosis not present

## 2019-09-11 DIAGNOSIS — L814 Other melanin hyperpigmentation: Secondary | ICD-10-CM | POA: Diagnosis not present

## 2019-09-11 DIAGNOSIS — L820 Inflamed seborrheic keratosis: Secondary | ICD-10-CM

## 2019-09-11 DIAGNOSIS — Z1283 Encounter for screening for malignant neoplasm of skin: Secondary | ICD-10-CM

## 2019-09-11 DIAGNOSIS — D692 Other nonthrombocytopenic purpura: Secondary | ICD-10-CM

## 2019-09-11 DIAGNOSIS — D1801 Hemangioma of skin and subcutaneous tissue: Secondary | ICD-10-CM

## 2019-09-11 DIAGNOSIS — L821 Other seborrheic keratosis: Secondary | ICD-10-CM

## 2019-09-11 DIAGNOSIS — D229 Melanocytic nevi, unspecified: Secondary | ICD-10-CM

## 2019-09-11 DIAGNOSIS — L304 Erythema intertrigo: Secondary | ICD-10-CM | POA: Diagnosis not present

## 2019-09-11 NOTE — Progress Notes (Signed)
Follow-Up Visit   Subjective  April Larsen is a 74 y.o. female who presents for the following: Annual Exam.  Patient here today for TBSE. She does have a history of SCC. Patient has one rough spot on right lower leg that she scratches. She recently had Moh's in December at [facility] for a SCC on the right cheek.  The patient presents for Total-Body Skin Exam (TBSE) for skin cancer screening and mole check.  The following portions of the chart were reviewed this encounter and updated as appropriate:  Tobacco  Allergies  Meds  Problems  Med Hx  Surg Hx  Fam Hx      Review of Systems:  No other skin or systemic complaints except as noted in HPI or Assessment and Plan.  Objective  Well appearing patient in no apparent distress; mood and affect are within normal limits.  A full examination was performed including scalp, head, eyes, ears, nose, lips, neck, chest, axillae, abdomen, back, buttocks, bilateral upper extremities, bilateral lower extremities, hands, feet, fingers, toes, fingernails, and toenails. All findings within normal limits unless otherwise noted below.  Objective  Forehead x 3, R calf x 1 (4): Erythematous keratotic or waxy stuck-on papule or plaque.   Objective  Bilateral arms: Violaceous macules and patches.   Objective  Chest x 16, R cheek x 1 (17): Erythematous thin papules/macules with gritty scale.   Objective  Lower Abdomen and groin: Erythema    Assessment & Plan    Inflamed seborrheic keratosis (4) Forehead x 3, R calf x 1  Destruction of lesion - Forehead x 3, R calf x 1 Complexity: simple   Destruction method: cryotherapy   Informed consent: discussed and consent obtained   Timeout:  patient name, date of birth, surgical site, and procedure verified Lesion destroyed using liquid nitrogen: Yes   Region frozen until ice ball extended beyond lesion: Yes   Outcome: patient tolerated procedure well with no complications     Post-procedure details: wound care instructions given    Senile purpura (HCC) Bilateral arms  Benign, observe.    AK (actinic keratosis) (17) Chest x 16, R cheek x 1  Destruction of lesion - Chest x 16, R cheek x 1 Complexity: simple   Destruction method: cryotherapy   Informed consent: discussed and consent obtained   Timeout:  patient name, date of birth, surgical site, and procedure verified Lesion destroyed using liquid nitrogen: Yes   Region frozen until ice ball extended beyond lesion: Yes   Outcome: patient tolerated procedure well with no complications   Post-procedure details: wound care instructions given    Erythema intertrigo Lower Abdomen and groin  Cont Zeasorb AF powder Cont Vytone PRN  Skin cancer screening   History of Squamous Cell Carcinoma of the Skin - No evidence of recurrence today - No lymphadenopathy - Recommend regular full body skin exams - Recommend daily broad spectrum sunscreen SPF 30+ to sun-exposed areas, reapply every 2 hours as needed.  - Call if any new or changing lesions are noted between office visits  Lentigines - Scattered tan macules - Discussed due to sun exposure - Benign, observe - Call for any changes  Seborrheic Keratoses - Stuck-on, waxy, tan-brown papules and plaques  - Discussed benign etiology and prognosis. - Observe - Call for any changes  Melanocytic Nevi - Tan-brown and/or pink-flesh-colored symmetric macules and papules - Benign appearing on exam today - Observation - Call clinic for new or changing moles - Recommend  daily use of broad spectrum spf 30+ sunscreen to sun-exposed areas.   Hemangiomas - Red papules - Discussed benign nature - Observe - Call for any changes  Actinic Damage - diffuse scaly erythematous macules with underlying dyspigmentation - Recommend daily broad spectrum sunscreen SPF 30+ to sun-exposed areas, reapply every 2 hours as needed.  - Call for new or changing  lesions.  Skin cancer screening performed today.   Return in about 6 months (around 03/12/2020) for sun exposed, TBSE in 1 year.  Graciella Belton, RMA, am acting as scribe for Sarina Ser, MD . Documentation: I have reviewed the above documentation for accuracy and completeness, and I agree with the above.  Sarina Ser, MD

## 2019-09-11 NOTE — Patient Instructions (Signed)
Recommend daily broad spectrum sunscreen SPF 30+ to sun-exposed areas, reapply every 2 hours as needed. Call for new or changing lesions.  Cryotherapy Aftercare  . Wash gently with soap and water everyday.   . Apply Vaseline and Band-Aid daily until healed.  

## 2019-09-14 ENCOUNTER — Encounter: Payer: Self-pay | Admitting: Dermatology

## 2019-09-14 DIAGNOSIS — R300 Dysuria: Secondary | ICD-10-CM | POA: Diagnosis not present

## 2019-09-20 DIAGNOSIS — M542 Cervicalgia: Secondary | ICD-10-CM | POA: Diagnosis not present

## 2019-09-20 DIAGNOSIS — K5909 Other constipation: Secondary | ICD-10-CM | POA: Diagnosis not present

## 2019-09-20 DIAGNOSIS — R270 Ataxia, unspecified: Secondary | ICD-10-CM | POA: Diagnosis not present

## 2019-10-26 DIAGNOSIS — Z20822 Contact with and (suspected) exposure to covid-19: Secondary | ICD-10-CM | POA: Diagnosis not present

## 2019-10-26 DIAGNOSIS — H6692 Otitis media, unspecified, left ear: Secondary | ICD-10-CM | POA: Diagnosis not present

## 2019-10-26 DIAGNOSIS — J029 Acute pharyngitis, unspecified: Secondary | ICD-10-CM | POA: Diagnosis not present

## 2019-11-14 DIAGNOSIS — R300 Dysuria: Secondary | ICD-10-CM | POA: Diagnosis not present

## 2019-11-28 DIAGNOSIS — R739 Hyperglycemia, unspecified: Secondary | ICD-10-CM | POA: Diagnosis not present

## 2019-11-28 DIAGNOSIS — E782 Mixed hyperlipidemia: Secondary | ICD-10-CM | POA: Diagnosis not present

## 2019-11-28 DIAGNOSIS — E538 Deficiency of other specified B group vitamins: Secondary | ICD-10-CM | POA: Diagnosis not present

## 2019-12-11 DIAGNOSIS — K5190 Ulcerative colitis, unspecified, without complications: Secondary | ICD-10-CM | POA: Diagnosis not present

## 2019-12-11 DIAGNOSIS — E782 Mixed hyperlipidemia: Secondary | ICD-10-CM | POA: Diagnosis not present

## 2019-12-11 DIAGNOSIS — Z23 Encounter for immunization: Secondary | ICD-10-CM | POA: Diagnosis not present

## 2019-12-11 DIAGNOSIS — M76899 Other specified enthesopathies of unspecified lower limb, excluding foot: Secondary | ICD-10-CM | POA: Diagnosis not present

## 2019-12-11 DIAGNOSIS — F330 Major depressive disorder, recurrent, mild: Secondary | ICD-10-CM | POA: Diagnosis not present

## 2019-12-11 DIAGNOSIS — Z0000 Encounter for general adult medical examination without abnormal findings: Secondary | ICD-10-CM | POA: Diagnosis not present

## 2019-12-11 DIAGNOSIS — M542 Cervicalgia: Secondary | ICD-10-CM | POA: Diagnosis not present

## 2019-12-12 ENCOUNTER — Ambulatory Visit: Payer: PPO | Admitting: Physical Therapy

## 2019-12-18 ENCOUNTER — Encounter: Payer: PPO | Admitting: Physical Therapy

## 2019-12-20 ENCOUNTER — Ambulatory Visit: Payer: PPO | Admitting: Physical Therapy

## 2019-12-25 ENCOUNTER — Encounter: Payer: PPO | Admitting: Physical Therapy

## 2019-12-27 ENCOUNTER — Ambulatory Visit: Payer: PPO | Admitting: Physical Therapy

## 2020-01-01 ENCOUNTER — Ambulatory Visit: Payer: PPO | Admitting: Physical Therapy

## 2020-01-02 ENCOUNTER — Other Ambulatory Visit: Payer: Self-pay

## 2020-01-02 ENCOUNTER — Encounter: Payer: Self-pay | Admitting: Physical Therapy

## 2020-01-02 ENCOUNTER — Ambulatory Visit: Payer: PPO | Attending: Internal Medicine | Admitting: Physical Therapy

## 2020-01-02 DIAGNOSIS — R2689 Other abnormalities of gait and mobility: Secondary | ICD-10-CM | POA: Diagnosis not present

## 2020-01-02 DIAGNOSIS — R262 Difficulty in walking, not elsewhere classified: Secondary | ICD-10-CM | POA: Diagnosis not present

## 2020-01-02 DIAGNOSIS — M6281 Muscle weakness (generalized): Secondary | ICD-10-CM | POA: Diagnosis not present

## 2020-01-02 NOTE — Addendum Note (Signed)
Addended by: Alanson Puls on: 01/02/2020 01:58 PM   Modules accepted: Orders

## 2020-01-02 NOTE — Therapy (Signed)
[place] MAIN [facility] SERVICES [address] [place], [place], [address] Phone: [phone]   Fax:  [phone]  Physical Therapy Evaluation  Patient Details  Name: April Larsen MRN: [id number] Date of Birth: [birth year] Referring Provider (PT): Emily Filbert   Encounter Date: 01/02/2020   PT End of Session - 01/02/20 1009    Visit Number 1    Number of Visits 17    Date for PT Re-Evaluation 02/27/20    PT Start Time 1000    PT Stop Time 1100    PT Time Calculation (min) 60 min    Equipment Utilized During Treatment Gait belt    Activity Tolerance Patient tolerated treatment well    Behavior During Therapy [facility] for tasks assessed/performed           Past Medical History:  Diagnosis Date  . Actinic keratosis   . Anemia    LAST HGB 15.2 ON 04-22-16  . Anxiety   . Arthritis   . Cancer ([place]) 2016   skin  . Cataract (lens) fragments in eye following cataract surgery, bilateral 2013   one eye done then 4 weeks later the other eye done.  . Chronic ulcerative colitis, without complications ([place]) 09/02/4401  . Diverticulitis   . Esophagitis, reflux 10/04/2014  . Family history of adverse reaction to anesthesia    SISTER HARD TO WAKE UP and nausea and vomiting.  . Female stress incontinence 08/09/2013  . Gastroesophageal reflux disease without esophagitis 08/06/2016  . GERD (gastroesophageal reflux disease)   . Headache    MIGRAINE  . History of hiatal hernia   . Incomplete emptying of bladder 10/29/2013  . Migraine 07/26/2013  . PONV (postoperative nausea and vomiting)    nausea, does not remember vomiting.  Marland Kitchen Postherpetic neuralgia    RIGHT 7TH.CRANIAL NERVE from shingles followed by Bell's palsy  . Postmenopausal 11/09/2016  . PVC (premature ventricular contraction)   . Scoliosis   . Scoliosis (and kyphoscoliosis), idiopathic 07/26/2013  . Scoliosis of lumbosacral spine 01/18/2015  . Squamous cell carcinoma of skin 03/13/2013   L  clavicle   . Squamous cell carcinoma of skin 03/10/2018   L med knee  . Squamous cell carcinoma of skin 02/21/2019   R cheek   . Squamous cell carcinoma of skin 06/01/2019   L mid lat pretibial - ED&C  . Trochanteric bursitis 11/29/2014  . Ulcerative colitis ([place])   . Urethral prolapse 08/09/2013  . Urge incontinence 08/09/2013    Past Surgical History:  Procedure Laterality Date  . ABDOMINAL HYSTERECTOMY    . BACK SURGERY     L3-L4 AND L3-S1 DECOMPRESSION  . CARPAL TUNNEL RELEASE    . CHOLECYSTECTOMY N/A 08/21/2016   Procedure: LAPAROSCOPIC CHOLECYSTECTOMY WITH INTRAOPERATIVE CHOLANGIOGRAM;  Surgeon: Leonie Green, MD;  Location: [facility] ORS;  Service: General;  Laterality: N/A;  . COLONOSCOPY  11/06/2005  . COLONOSCOPY WITH PROPOFOL N/A 07/29/2016   Procedure: COLONOSCOPY WITH PROPOFOL;  Surgeon: Manya Silvas, MD;  Location: [facility] ENDOSCOPY;  Service: Endoscopy;  Laterality: N/A;  . ESOPHAGOGASTRODUODENOSCOPY  11/06/2005  . ESOPHAGOGASTRODUODENOSCOPY (EGD) WITH PROPOFOL N/A 07/29/2016   Procedure: ESOPHAGOGASTRODUODENOSCOPY (EGD) WITH PROPOFOL;  Surgeon: Manya Silvas, MD;  Location: [facility] ENDOSCOPY;  Service: Endoscopy;  Laterality: N/A;  . LAPAROSCOPIC LOW ANTERIOR RESECTION  11/24/2016   Procedure: LAPAROSCOPIC LOW ANTERIOR RESECTION;  Surgeon: Jules Husbands, MD;  Location: [facility] ORS;  Service: General;;    There were no vitals filed for this visit.  Subjective Assessment - 01/02/20 1002    Subjective Patient reports having some balance problems.She feels that she stumbles and has had 4 falls in the last 6 months.    Pertinent History Per MD note:Patient presents for intermittent ear pain and sore throat over the last week. She reports she has pain in her left ear that radiates to the left side of her throat. She is able to eat and drink without issues. She has had nausea this morning. She denies hearing loss, dizziness, and ear drainage. She has not been around any other  sick contacts. She has completed Pfizer vaccine, last dose was in March. She had Covid in October of last year and was hospitalized for 1 week. She denies fever, chills, sinus pressure, cough, congestion, chest pain, vomiting, diarrhea, GU symptoms, dizziness, weakness, syncope.    Patient Stated Goals to be able to stop falling and stumbling    Currently in Pain? Yes    Pain Score 4     Pain Location Leg    Pain Orientation Right    Pain Descriptors / Indicators Aching    Pain Type Chronic pain    Pain Onset More than a month ago    Aggravating Factors  RLE    Pain Relieving Factors sitting    Effect of Pain on Daily Activities none    Multiple Pain Sites No              [facility] PT Assessment - 01/02/20 1014      Assessment   Medical Diagnosis balance disorder    Referring Provider (PT) Emily Filbert    Onset Date/Surgical Date 11/28/19    Hand Dominance Right      Precautions   Precautions Fall      Restrictions   Weight Bearing Restrictions No      Balance Screen   Has the patient fallen in the past 6 months Yes    How many times? 4    Has the patient had a decrease in activity level because of a fear of falling?  Yes    Is the patient reluctant to leave their home because of a fear of falling?  No      Home Social worker Private residence    Living Arrangements Children    Available Help at Discharge Family    Type of [place] to enter    Entrance Stairs-Number of Steps 3    Entrance Stairs-Rails Right    Home Layout One level    [place] bars - tub/shower      Prior Function   Level of Independence Independent;Independent with household mobility without device    Vocation Retired               POSTURE: WFL   PROM/AROM: PROM BUE:WFL AROM BUE WFL PROM BLE WFL AROM BLE: WFL  STRENGTH:  Graded on a 0-5 scale Muscle Group Left Right                          Hip Flex 4/5 4/5  Hip Abd 4/5  4/5  Hip Add 4/5 4/5  Hip Ext 4/5 4/5  Hip IR/ER 4/5 4/5  Knee Flex 4/5 4/5  Knee Ext 4/5 4/5  Ankle DF 4/5 4/5  Ankle PF 4/5 4/5   SENSATION:   BLE : tingling in LLE intermittent foot  NEUROLOGICAL SCREEN: (2+ unless  otherwise noted.) N=normal  Ab=abnormal   Level Dermatome R L                                          L3 Lower thigh/med.knee N N  L4 Medial leg/lat thigh N N  L5 Lat. leg & dorsal foot N N  S1 post/lat foot/thigh/leg N N  S2 Post./med. thigh & leg N N    SOMATOSENSORY:  Any N & T in extremities or weakness: reports :         Sensation           Intact      Diminished         Absent  Light touch LEs                                   FUNCTIONAL MOBILITY:independent with bed mobility supine <> sit   BALANCE: Static Standing Balance  Normal Able to maintain standing balance against maximal resistance   Good Able to maintain standing balance against moderate resistance   Good-/Fair+ Able to maintain standing balance against minimal resistance x  Fair Able to stand unsupported without UE support and without LOB for 1-2 min   Fair- Requires Min A and UE support to maintain standing without loss of balance   Poor+ Requires mod A and UE support to maintain standing without loss of balance   Poor Requires max A and UE support to maintain standing balance without loss    Standing Dynamic Balance  Normal Stand independently unsupported, able to weight shift and cross midline maximally   Good Stand independently unsupported, able to weight shift and cross midline moderately x  Good-/Fair+ Stand independently unsupported, able to weight shift across midline minimally   Fair Stand independently unsupported, weight shift, and reach ipsilaterally, loss of balance when crossing midline   Poor+ Able to stand with Min A and reach ipsilaterally, unable to weight shift   Poor Able to stand with Mod A and minimally reach ipsilaterally, unable to cross midline.        GAIT: Patient ambulates without AD with decreased gait speed  OUTCOME MEASURES: TEST Outcome Interpretation  5 times sit<>stand 15.59 sec >60 yo, >15 sec indicates increased risk for falls  10 meter walk test       .84          m/s <1.0 m/s indicates increased risk for falls; limited community ambulator  Timed up and Go   12.49              sec <14 sec indicates increased risk for falls      Berg Balance Assessment 46/56 <36/56 (100% risk for falls), 37-45 (80% risk for falls); 46-51 (>50% risk for falls); 52-55 (lower risk <25% of falls)         Treatment: Standing in the corner with modified tandem stand with head turns with CGA       Objective measurements completed on examination: See above findings.               PT Education - 01/02/20 1009    Education Details Plan of care    Person(s) Educated Patient    Methods Explanation    Comprehension Verbalized understanding  Plan - 01/02/20 1010    Clinical Impression Statement Patient presents with decreased gait speed, berg balance , and decreased BLE ankle  strength. Patient's main complaint is inability to participate in desired activities. Patient wants to improve  balance and ability to ambulate on all  surfaces safely. Patient will benefit from skilled PT in order to increase gait speed, increase BLE strength, and improve dynamic standing balance to decrease risk for falls and enable patient to participate in desired activities.   Personal Factors and Comorbidities Age    Stability/Clinical Decision Making Stable/Uncomplicated    Clinical Decision Making Low    Rehab Potential Fair    PT Frequency 2x / week    PT Duration 8 weeks    PT Treatment/Interventions Functional mobility training;Balance training;Neuromuscular re-education;Therapeutic exercise;Therapeutic activities;Patient/family education           Patient will benefit from skilled therapeutic  intervention in order to improve the following deficits and impairments:  Abnormal gait, Pain, Decreased knowledge of use of DME, Decreased mobility, Decreased activity tolerance, Decreased endurance, Decreased range of motion, Decreased strength  Visit Diagnosis: Other abnormalities of gait and mobility  Difficulty in walking, not elsewhere classified  Muscle weakness (generalized)     Problem List Patient Active Problem List   Diagnosis Date Noted  . Pneumonia due to COVID-19 virus 01/09/2019  . Acute respiratory failure with hypoxia ([place]) 01/09/2019  . Postmenopausal 11/09/2016  . Diverticulitis 08/06/2016  . Gastroesophageal reflux disease without esophagitis 08/06/2016  . Chronic ulcerative colitis, without complications ([place]) 23/95/3202  . Scoliosis of lumbosacral spine 01/18/2015  . Trochanteric bursitis 11/29/2014  . Esophagitis, reflux 10/04/2014  . Incomplete emptying of bladder 10/29/2013  . Female stress incontinence 08/09/2013  . Urethral prolapse 08/09/2013  . Urge incontinence 08/09/2013  . Migraine 07/26/2013  . Scoliosis (and kyphoscoliosis), idiopathic 07/26/2013  . Ulcerative colitis ([place]) 07/26/2013    Alanson Puls, PT DPT 01/02/2020, 10:19 AM  [place] MAIN [facility] SERVICES [address] [place], [place], [address] Phone: [phone]   Fax:  [phone]  Name: April Larsen MRN: [id number] Date of Birth: May 18, 1945

## 2020-01-03 ENCOUNTER — Other Ambulatory Visit: Payer: Self-pay

## 2020-01-03 ENCOUNTER — Ambulatory Visit: Payer: PPO | Admitting: Physical Therapy

## 2020-01-03 ENCOUNTER — Encounter: Payer: Self-pay | Admitting: Physical Therapy

## 2020-01-03 DIAGNOSIS — M6281 Muscle weakness (generalized): Secondary | ICD-10-CM

## 2020-01-03 DIAGNOSIS — R2689 Other abnormalities of gait and mobility: Secondary | ICD-10-CM | POA: Diagnosis not present

## 2020-01-03 DIAGNOSIS — R262 Difficulty in walking, not elsewhere classified: Secondary | ICD-10-CM

## 2020-01-03 NOTE — Therapy (Signed)
[place] MAIN [facility] SERVICES [address] [place], [place], [address] Phone: [phone]   Fax:  [phone]  Physical Therapy Treatment  Patient Details  Name: April Larsen MRN: [id number] Date of Birth: February 04, 1946 Referring Provider (PT): Emily Filbert   Encounter Date: 01/03/2020   PT End of Session - 01/03/20 1151    Visit Number 2    Number of Visits 17    Date for PT Re-Evaluation 02/27/20    PT Start Time 0160    PT Stop Time 1230    PT Time Calculation (min) 45 min    Equipment Utilized During Treatment Gait belt    Activity Tolerance Patient tolerated treatment well    Behavior During Therapy [facility] for tasks assessed/performed           Past Medical History:  Diagnosis Date  . Actinic keratosis   . Anemia    LAST HGB 15.2 ON 04-22-16  . Anxiety   . Arthritis   . Cancer ([place]) 2016   skin  . Cataract (lens) fragments in eye following cataract surgery, bilateral 2013   one eye done then 4 weeks later the other eye done.  . Chronic ulcerative colitis, without complications ([place]) 1/0/9323  . Diverticulitis   . Esophagitis, reflux 10/04/2014  . Family history of adverse reaction to anesthesia    SISTER HARD TO WAKE UP and nausea and vomiting.  . Female stress incontinence 08/09/2013  . Gastroesophageal reflux disease without esophagitis 08/06/2016  . GERD (gastroesophageal reflux disease)   . Headache    MIGRAINE  . History of hiatal hernia   . Incomplete emptying of bladder 10/29/2013  . Migraine 07/26/2013  . PONV (postoperative nausea and vomiting)    nausea, does not remember vomiting.  Marland Kitchen Postherpetic neuralgia    RIGHT 7TH.CRANIAL NERVE from shingles followed by Bell's palsy  . Postmenopausal 11/09/2016  . PVC (premature ventricular contraction)   . Scoliosis   . Scoliosis (and kyphoscoliosis), idiopathic 07/26/2013  . Scoliosis of lumbosacral spine 01/18/2015  . Squamous cell carcinoma of skin 03/13/2013   L  clavicle   . Squamous cell carcinoma of skin 03/10/2018   L med knee  . Squamous cell carcinoma of skin 02/21/2019   R cheek   . Squamous cell carcinoma of skin 06/01/2019   L mid lat pretibial - ED&C  . Trochanteric bursitis 11/29/2014  . Ulcerative colitis ([place])   . Urethral prolapse 08/09/2013  . Urge incontinence 08/09/2013    Past Surgical History:  Procedure Laterality Date  . ABDOMINAL HYSTERECTOMY    . BACK SURGERY     L3-L4 AND L3-S1 DECOMPRESSION  . CARPAL TUNNEL RELEASE    . CHOLECYSTECTOMY N/A 08/21/2016   Procedure: LAPAROSCOPIC CHOLECYSTECTOMY WITH INTRAOPERATIVE CHOLANGIOGRAM;  Surgeon: Leonie Green, MD;  Location: [facility] ORS;  Service: General;  Laterality: N/A;  . COLONOSCOPY  11/06/2005  . COLONOSCOPY WITH PROPOFOL N/A 07/29/2016   Procedure: COLONOSCOPY WITH PROPOFOL;  Surgeon: Manya Silvas, MD;  Location: [facility] ENDOSCOPY;  Service: Endoscopy;  Laterality: N/A;  . ESOPHAGOGASTRODUODENOSCOPY  11/06/2005  . ESOPHAGOGASTRODUODENOSCOPY (EGD) WITH PROPOFOL N/A 07/29/2016   Procedure: ESOPHAGOGASTRODUODENOSCOPY (EGD) WITH PROPOFOL;  Surgeon: Manya Silvas, MD;  Location: [facility] ENDOSCOPY;  Service: Endoscopy;  Laterality: N/A;  . LAPAROSCOPIC LOW ANTERIOR RESECTION  11/24/2016   Procedure: LAPAROSCOPIC LOW ANTERIOR RESECTION;  Surgeon: Jules Husbands, MD;  Location: [facility] ORS;  Service: General;;    There were no vitals filed for this visit.  Subjective Assessment - 01/03/20 1149    Subjective Patient reports having some balance problems.She feels that she stumbles and has had 4 falls in the last 6 months.    Pertinent History Per MD note:Patient presents for intermittent ear pain and sore throat over the last week. She reports she has pain in her left ear that radiates to the left side of her throat. She is able to eat and drink without issues. She has had nausea this morning. She denies hearing loss, dizziness, and ear drainage. She has not been around any other sick  contacts. She has completed Pfizer vaccine, last dose was in March. She had Covid in October of last year and was hospitalized for 1 week. She denies fever, chills, sinus pressure, cough, congestion, chest pain, vomiting, diarrhea, GU symptoms, dizziness, weakness, syncope.    Patient Stated Goals to be able to stop falling and stumbling    Currently in Pain? Yes    Pain Score 4     Pain Location Leg    Pain Orientation Right    Pain Descriptors / Indicators Aching    Pain Type Chronic pain    Pain Onset More than a month ago           Neuromuscular Re-education  Rocker board fwd/bwd, side to side x 20 each direction Tandem gait on 2"x4" without UE support x 2 lengths Side stepping on 2"x4" without UE support x 2 lengths Heel/toe raises without UE support 3s hold x 10 each 1/2 foam roll balance with flat side up 30s x 2 reps 1/2 foam roll balance with flat side down 30s x 2 reps 1/2 foam roll tandem balance alternating forward LE 30s x 2 each LE forward Lateral side steps from foam to 6 inch stool left and right x 15 Backwards stepping from foam to 6 inch stool x 15  Tandem on 1/2 foam and yellow disk with head turns x 2 mins      Pt educated throughout session about proper posture and technique with exercises. Improved exercise technique, movement at target joints, use of target muscles after min to mod verbal, visual, tactile cues. CGA and Min to mod verbal cues used throughout with increased in postural sway and LOB most seen with narrow base of support and while on uneven surfaces. Continues to have balance deficits typical with diagnosis.                         PT Education - 01/03/20 1150    Education Details HEP    Person(s) Educated Patient    Methods Explanation    Comprehension Verbalized understanding;Need further instruction            PT Short Term Goals - 01/02/20 1339      PT SHORT TERM GOAL #1   Title Patient will be independent in  home exercise program to improve strength/mobility for better functional independence with ADLs.    Time 4    Period Weeks    Status New    Target Date 01/30/20      PT SHORT TERM GOAL #2   Title Patient (> 37 years old) will complete five times sit to stand test in < 15 seconds indicating an increased LE strength and improved balance.    Time 4    Period Weeks    Status New    Target Date 01/30/20  PT Long Term Goals - 01/02/20 1340      PT LONG TERM GOAL #1   Title Patient will increase Berg Balance score by > 6 points to demonstrate decreased fall risk during functional activities.    Time 8    Period Weeks    Status New    Target Date 02/27/20      PT LONG TERM GOAL #2   Title Patient will increase 10 meter walk test to >1.53ms as to improve gait speed for better community ambulation and to reduce fall risk.    Time 8    Period Weeks    Status New    Target Date 02/27/20      PT LONG TERM GOAL #3   Title Patient will reduce timed up and go to <11 seconds to reduce fall risk and demonstrate improved transfer/gait ability.    Time 8    Period Weeks    Status New    Target Date 02/27/20                 Plan - 01/03/20 1152    Clinical Impression Statement  Pt was able to perform all exercises today with CGA..Marland KitchenPt was able to perform all balance and strength exercises, demonstrating deficits in LE strength and stability.  Pt was able to complete dynamic balance exercises, showing ability to stand on even surfaces with min assist and improve postural reactions to correct self during activities.  Pt requires verbal, visual and tactile cues during exercise in order to complete tasks with proper form and technique. Pt would continue to benefit from skilled PT services in order to further strengthen LE's, improve static and dynamic balance, and improve coordination in order to increase functional mobility and decrease risk of falls   Personal Factors and  Comorbidities Age    Stability/Clinical Decision Making Stable/Uncomplicated    Rehab Potential Fair    PT Frequency 2x / week    PT Duration 8 weeks    PT Treatment/Interventions Functional mobility training;Balance training;Neuromuscular re-education;Therapeutic exercise;Therapeutic activities;Patient/family education           Patient will benefit from skilled therapeutic intervention in order to improve the following deficits and impairments:  Abnormal gait, Pain, Decreased knowledge of use of DME, Decreased mobility, Decreased activity tolerance, Decreased endurance, Decreased range of motion, Decreased strength  Visit Diagnosis: Other abnormalities of gait and mobility  Difficulty in walking, not elsewhere classified  Muscle weakness (generalized)     Problem List Patient Active Problem List   Diagnosis Date Noted  . Pneumonia due to COVID-19 virus 01/09/2019  . [place] respiratory [place] with hypoxia ([place] 01/09/2019  . Postmenopausal 11/09/2016  . Diverticulitis 08/06/2016  . Gastroesophageal reflux disease without esophagitis 08/06/2016  . Chronic ulcerative colitis, without complications ([place] 041/93/7902 . Scoliosis of lumbosacral spine 01/18/2015  . Trochanteric bursitis 11/29/2014  . Esophagitis, reflux 10/04/2014  . Incomplete emptying of bladder 10/29/2013  . Female stress incontinence 08/09/2013  . Urethral prolapse 08/09/2013  . Urge incontinence 08/09/2013  . Migraine 07/26/2013  . Scoliosis (and kyphoscoliosis), idiopathic 07/26/2013  . Ulcerative colitis ([place] 07/26/2013    MAlanson Puls PT DPT 01/03/2020, 11:53 AM  [facility] [address] [address]Phone: [phone]  Fax:  [phone] Name: SMakenley ShimpMRN: [id number] of Birth: [birth year]

## 2020-01-05 DIAGNOSIS — M1711 Unilateral primary osteoarthritis, right knee: Secondary | ICD-10-CM | POA: Insufficient documentation

## 2020-01-05 DIAGNOSIS — M23300 Other meniscus derangements, unspecified lateral meniscus, right knee: Secondary | ICD-10-CM | POA: Diagnosis not present

## 2020-01-08 ENCOUNTER — Ambulatory Visit: Payer: PPO | Admitting: Physical Therapy

## 2020-01-10 ENCOUNTER — Ambulatory Visit: Payer: PPO | Admitting: Physical Therapy

## 2020-01-11 ENCOUNTER — Encounter: Payer: Self-pay | Admitting: Physical Therapy

## 2020-01-11 ENCOUNTER — Other Ambulatory Visit: Payer: Self-pay

## 2020-01-11 ENCOUNTER — Ambulatory Visit: Payer: PPO | Admitting: Physical Therapy

## 2020-01-11 DIAGNOSIS — R2689 Other abnormalities of gait and mobility: Secondary | ICD-10-CM | POA: Diagnosis not present

## 2020-01-11 DIAGNOSIS — R262 Difficulty in walking, not elsewhere classified: Secondary | ICD-10-CM

## 2020-01-11 DIAGNOSIS — M6281 Muscle weakness (generalized): Secondary | ICD-10-CM

## 2020-01-11 NOTE — Therapy (Signed)
[place] MAIN [facility] SERVICES [address] [place], [place], [address] Phone: [phone]   Fax:  [phone]  Physical Therapy Treatment  Patient Details  Name: April Larsen MRN: [id number] Date of Birth: Jul 05, 1945 Referring Provider (PT): Emily Filbert   Encounter Date: 01/11/2020   PT End of Session - 01/11/20 1020    Visit Number 2    Number of Visits 17    Date for PT Re-Evaluation 02/27/20    PT Start Time 4492    PT Stop Time 1055    PT Time Calculation (min) 40 min    Equipment Utilized During Treatment Gait belt    Activity Tolerance Patient tolerated treatment well    Behavior During Therapy [facility] for tasks assessed/performed           Past Medical History:  Diagnosis Date  . Actinic keratosis   . Anemia    LAST HGB 15.2 ON 04-22-16  . Anxiety   . Arthritis   . Cancer ([place]) 2016   skin  . Cataract (lens) fragments in eye following cataract surgery, bilateral 2013   one eye done then 4 weeks later the other eye done.  . Chronic ulcerative colitis, without complications ([place]) 0/03/69  . Diverticulitis   . Esophagitis, reflux 10/04/2014  . Family history of adverse reaction to anesthesia    SISTER HARD TO WAKE UP and nausea and vomiting.  . Female stress incontinence 08/09/2013  . Gastroesophageal reflux disease without esophagitis 08/06/2016  . GERD (gastroesophageal reflux disease)   . Headache    MIGRAINE  . History of hiatal hernia   . Incomplete emptying of bladder 10/29/2013  . Migraine 07/26/2013  . PONV (postoperative nausea and vomiting)    nausea, does not remember vomiting.  Marland Kitchen Postherpetic neuralgia    RIGHT 7TH.CRANIAL NERVE from shingles followed by Bell's palsy  . Postmenopausal 11/09/2016  . PVC (premature ventricular contraction)   . Scoliosis   . Scoliosis (and kyphoscoliosis), idiopathic 07/26/2013  . Scoliosis of lumbosacral spine 01/18/2015  . Squamous cell carcinoma of skin 03/13/2013   L  clavicle   . Squamous cell carcinoma of skin 03/10/2018   L med knee  . Squamous cell carcinoma of skin 02/21/2019   R cheek   . Squamous cell carcinoma of skin 06/01/2019   L mid lat pretibial - ED&C  . Trochanteric bursitis 11/29/2014  . Ulcerative colitis ([place])   . Urethral prolapse 08/09/2013  . Urge incontinence 08/09/2013    Past Surgical History:  Procedure Laterality Date  . ABDOMINAL HYSTERECTOMY    . BACK SURGERY     L3-L4 AND L3-S1 DECOMPRESSION  . CARPAL TUNNEL RELEASE    . CHOLECYSTECTOMY N/A 08/21/2016   Procedure: LAPAROSCOPIC CHOLECYSTECTOMY WITH INTRAOPERATIVE CHOLANGIOGRAM;  Surgeon: Leonie Green, MD;  Location: [facility] ORS;  Service: General;  Laterality: N/A;  . COLONOSCOPY  11/06/2005  . COLONOSCOPY WITH PROPOFOL N/A 07/29/2016   Procedure: COLONOSCOPY WITH PROPOFOL;  Surgeon: Manya Silvas, MD;  Location: [facility] ENDOSCOPY;  Service: Endoscopy;  Laterality: N/A;  . ESOPHAGOGASTRODUODENOSCOPY  11/06/2005  . ESOPHAGOGASTRODUODENOSCOPY (EGD) WITH PROPOFOL N/A 07/29/2016   Procedure: ESOPHAGOGASTRODUODENOSCOPY (EGD) WITH PROPOFOL;  Surgeon: Manya Silvas, MD;  Location: [facility] ENDOSCOPY;  Service: Endoscopy;  Laterality: N/A;  . LAPAROSCOPIC LOW ANTERIOR RESECTION  11/24/2016   Procedure: LAPAROSCOPIC LOW ANTERIOR RESECTION;  Surgeon: Jules Husbands, MD;  Location: [facility] ORS;  Service: General;;    There were no vitals filed for this visit.  Subjective Assessment - 01/11/20 1019    Subjective Patient reports having some balance problems.She feels that she stumbles and has had 4 falls in the last 6 months.    Patient Stated Goals to be able to stop falling and stumbling    Currently in Pain? No/denies    Pain Score 0-No pain             Neuromuscular Re-education  Lunge to BOSU ball x 15 x 2 sets BLE Tandem gait on 2"x4" without UE support x 2 lengths Side stepping on 2"x4" without UE support x 2 lengths Heel/toe raises without UE support 3s hold x 10  each 1/2 foam roll balance with flat side up 30s x 2 reps 1/2 foam roll balance with flat side down 30s x 2 reps Lateral side steps from foam to 6 inch stool left and right x 15 Backwards stepping from foam to 6 inch stool x 15  Four Square fwd/bwd, side to side , diagonal x 10 ,cues for posture and stepping strategies, occasional LOB Leg press 55 lbs x 10, 40 lbs x 20 x 2       Pt educated throughout session about proper posture and technique with exercises. Improved exercise technique, movement at target joints, use of target muscles after min to mod verbal, visual, tactile cues. CGA and Min to mod verbal cues used throughout with increased in postural sway and LOB most seen with narrow base of support and while on uneven surfaces. Continues to have balance deficits typical with diagnosis.                         PT Education - 01/11/20 1020    Education Details HEP    Person(s) Educated Patient    Methods Explanation    Comprehension Verbalized understanding            PT Short Term Goals - 01/02/20 1339      PT SHORT TERM GOAL #1   Title Patient will be independent in home exercise program to improve strength/mobility for better functional independence with ADLs.    Time 4    Period Weeks    Status New    Target Date 01/30/20      PT SHORT TERM GOAL #2   Title Patient (> 10 years old) will complete five times sit to stand test in < 15 seconds indicating an increased LE strength and improved balance.    Time 4    Period Weeks    Status New    Target Date 01/30/20             PT Long Term Goals - 01/02/20 1340      PT LONG TERM GOAL #1   Title Patient will increase Berg Balance score by > 6 points to demonstrate decreased fall risk during functional activities.    Time 8    Period Weeks    Status New    Target Date 02/27/20      PT LONG TERM GOAL #2   Title Patient will increase 10 meter walk test to >1.6ms as to improve gait speed for  better community ambulation and to reduce fall risk.    Time 8    Period Weeks    Status New    Target Date 02/27/20      PT LONG TERM GOAL #3   Title Patient will reduce timed up and go to <11 seconds to reduce fall risk  and demonstrate improved transfer/gait ability.    Time 8    Period Weeks    Status New    Target Date 02/27/20                 Plan - 01/11/20 1020    Clinical Impression Statement Patient instructed in beginning balance and coordination exercise. Patient required mod VCs and min A for gait to improve weight shift and postural control. Patient requires min VCs to improve  stability with balance activities. .  Patients would benefit from additional skilled PT intervention to improve balance/gait safety and reduce fall risk.   Personal Factors and Comorbidities Age    Stability/Clinical Decision Making Stable/Uncomplicated    Rehab Potential Fair    PT Frequency 2x / week    PT Duration 8 weeks    PT Treatment/Interventions Functional mobility training;Balance training;Neuromuscular re-education;Therapeutic exercise;Therapeutic activities;Patient/family education           Patient will benefit from skilled therapeutic intervention in order to improve the following deficits and impairments:  Abnormal gait, Pain, Decreased knowledge of use of DME, Decreased mobility, Decreased activity tolerance, Decreased endurance, Decreased range of motion, Decreased strength  Visit Diagnosis: Other abnormalities of gait and mobility  Difficulty in walking, not elsewhere classified  Muscle weakness (generalized)     Problem List Patient Active Problem List   Diagnosis Date Noted  . Pneumonia due to COVID-19 virus 01/09/2019  . Acute respiratory failure with hypoxia ([place]) 01/09/2019  . Postmenopausal 11/09/2016  . Diverticulitis 08/06/2016  . Gastroesophageal reflux disease without esophagitis 08/06/2016  . Chronic ulcerative colitis, without complications ([place])  38/12/1749  . Scoliosis of lumbosacral spine 01/18/2015  . Trochanteric bursitis 11/29/2014  . Esophagitis, reflux 10/04/2014  . Incomplete emptying of bladder 10/29/2013  . Female stress incontinence 08/09/2013  . Urethral prolapse 08/09/2013  . Urge incontinence 08/09/2013  . Migraine 07/26/2013  . Scoliosis (and kyphoscoliosis), idiopathic 07/26/2013  . Ulcerative colitis ([place]) 07/26/2013    Alanson Puls, [place] DPT 01/11/2020, 10:21 AM  [place] MAIN [facility] SERVICES [address] [place], [place], [address] Phone: [phone]   Fax:  [phone]  Name: April Larsen MRN: [id number] Date of Birth: [birth year]

## 2020-01-12 ENCOUNTER — Other Ambulatory Visit
Admission: RE | Admit: 2020-01-12 | Discharge: 2020-01-12 | Disposition: A | Discharge status: Home / Self Care | Payer: PPO | Source: Ambulatory Visit | Attending: Family Medicine | Admitting: Family Medicine

## 2020-01-12 DIAGNOSIS — R0789 Other chest pain: Secondary | ICD-10-CM | POA: Insufficient documentation

## 2020-01-12 DIAGNOSIS — K449 Diaphragmatic hernia without obstruction or gangrene: Secondary | ICD-10-CM | POA: Diagnosis not present

## 2020-01-12 LAB — TROPONIN I (HIGH SENSITIVITY): Troponin I (High Sensitivity): 5 ng/L (ref <18)

## 2020-01-15 ENCOUNTER — Ambulatory Visit: Payer: PPO

## 2020-01-15 ENCOUNTER — Other Ambulatory Visit: Payer: Self-pay

## 2020-01-15 DIAGNOSIS — M6281 Muscle weakness (generalized): Secondary | ICD-10-CM

## 2020-01-15 DIAGNOSIS — R2689 Other abnormalities of gait and mobility: Secondary | ICD-10-CM

## 2020-01-15 DIAGNOSIS — R262 Difficulty in walking, not elsewhere classified: Secondary | ICD-10-CM

## 2020-01-15 NOTE — Therapy (Signed)
[place] MAIN [facility] SERVICES [address] [place], [place], [address] Phone: [phone]   Fax:  [phone]  Physical Therapy Treatment  Patient Details  Name: April Larsen MRN: [id number] Date of Birth: 1945-07-09 Referring Provider (PT): Emily Filbert   Encounter Date: 01/15/2020   PT End of Session - 01/15/20 1036    Visit Number 3    Number of Visits 17    Date for PT Re-Evaluation 02/27/20    PT Start Time 1032    PT Stop Time 1112    PT Time Calculation (min) 40 min    Equipment Utilized During Treatment Gait belt    Activity Tolerance Patient tolerated treatment well    Behavior During Therapy [facility] for tasks assessed/performed           Past Medical History:  Diagnosis Date  . Actinic keratosis   . Anemia    LAST HGB 15.2 ON 04-22-16  . Anxiety   . Arthritis   . Cancer ([place]) 2016   skin  . Cataract (lens) fragments in eye following cataract surgery, bilateral 2013   one eye done then 4 weeks later the other eye done.  . Chronic ulcerative colitis, without complications ([place]) 0/05/4095  . Diverticulitis   . Esophagitis, reflux 10/04/2014  . Family history of adverse reaction to anesthesia    SISTER HARD TO WAKE UP and nausea and vomiting.  . Female stress incontinence 08/09/2013  . Gastroesophageal reflux disease without esophagitis 08/06/2016  . GERD (gastroesophageal reflux disease)   . Headache    MIGRAINE  . History of hiatal hernia   . Incomplete emptying of bladder 10/29/2013  . Migraine 07/26/2013  . PONV (postoperative nausea and vomiting)    nausea, does not remember vomiting.  Marland Kitchen Postherpetic neuralgia    RIGHT 7TH.CRANIAL NERVE from shingles followed by Bell's palsy  . Postmenopausal 11/09/2016  . PVC (premature ventricular contraction)   . Scoliosis   . Scoliosis (and kyphoscoliosis), idiopathic 07/26/2013  . Scoliosis of lumbosacral spine 01/18/2015  . Squamous cell carcinoma of skin 03/13/2013   L  clavicle   . Squamous cell carcinoma of skin 03/10/2018   L med knee  . Squamous cell carcinoma of skin 02/21/2019   R cheek   . Squamous cell carcinoma of skin 06/01/2019   L mid lat pretibial - ED&C  . Trochanteric bursitis 11/29/2014  . Ulcerative colitis ([place])   . Urethral prolapse 08/09/2013  . Urge incontinence 08/09/2013    Past Surgical History:  Procedure Laterality Date  . ABDOMINAL HYSTERECTOMY    . BACK SURGERY     L3-L4 AND L3-S1 DECOMPRESSION  . CARPAL TUNNEL RELEASE    . CHOLECYSTECTOMY N/A 08/21/2016   Procedure: LAPAROSCOPIC CHOLECYSTECTOMY WITH INTRAOPERATIVE CHOLANGIOGRAM;  Surgeon: Leonie Green, MD;  Location: [facility] ORS;  Service: General;  Laterality: N/A;  . COLONOSCOPY  11/06/2005  . COLONOSCOPY WITH PROPOFOL N/A 07/29/2016   Procedure: COLONOSCOPY WITH PROPOFOL;  Surgeon: Manya Silvas, MD;  Location: [facility] ENDOSCOPY;  Service: Endoscopy;  Laterality: N/A;  . ESOPHAGOGASTRODUODENOSCOPY  11/06/2005  . ESOPHAGOGASTRODUODENOSCOPY (EGD) WITH PROPOFOL N/A 07/29/2016   Procedure: ESOPHAGOGASTRODUODENOSCOPY (EGD) WITH PROPOFOL;  Surgeon: Manya Silvas, MD;  Location: [facility] ENDOSCOPY;  Service: Endoscopy;  Laterality: N/A;  . LAPAROSCOPIC LOW ANTERIOR RESECTION  11/24/2016   Procedure: LAPAROSCOPIC LOW ANTERIOR RESECTION;  Surgeon: Jules Husbands, MD;  Location: [facility] ORS;  Service: General;;    There were no vitals filed for this visit.  Subjective Assessment - 01/15/20 1035    Subjective Pt doing well this date. Denies any updates since last visit. Pt did not have time to work on HEP this past week.    Pertinent History Per MD note:Patient presents for intermittent ear pain and sore throat over the last week. She reports she has pain in her left ear that radiates to the left side of her throat. She is able to eat and drink without issues. She has had nausea this morning. She denies hearing loss, dizziness, and ear drainage. She has not been around any other sick  contacts. She has completed Pfizer vaccine, last dose was in March. She had Covid in October of last year and was hospitalized for 1 week. She denies fever, chills, sinus pressure, cough, congestion, chest pain, vomiting, diarrhea, GU symptoms, dizziness, weakness, syncope.    Patient Stated Goals to be able to stop falling and stumbling    Currently in Pain? No/denies            AGILITY LADDER TRAINING, 5lb weights for all  -Heel/toe raises without UE support 3s hold x 15 each -STS from chair 2x10 -FWD step-to 1x each -FWD step through 1x each -Lateral side steps 1x each -retro gait 2xeach -Fwd diagonals 1xeach  -58M speed intervals with 5lb weights ( 4x, minguard Assist)  -12.5lb cable resisted walking  FWD/Retro x3 (includes hurdle, 4" step, red mat)  -1x lateral each way  -airex foam ball toos x20        PT Short Term Goals - 01/02/20 1339      PT SHORT TERM GOAL #1   Title Patient will be independent in home exercise program to improve strength/mobility for better functional independence with ADLs.    Time 4    Period Weeks    Status New    Target Date 01/30/20      PT SHORT TERM GOAL #2   Title Patient (> 40 years old) will complete five times sit to stand test in < 15 seconds indicating an increased LE strength and improved balance.    Time 4    Period Weeks    Status New    Target Date 01/30/20             PT Long Term Goals - 01/02/20 1340      PT LONG TERM GOAL #1   Title Patient will increase Berg Balance score by > 6 points to demonstrate decreased fall risk during functional activities.    Time 8    Period Weeks    Status New    Target Date 02/27/20      PT LONG TERM GOAL #2   Title Patient will increase 10 meter walk test to >1.78ms as to improve gait speed for better community ambulation and to reduce fall risk.    Time 8    Period Weeks    Status New    Target Date 02/27/20      PT LONG TERM GOAL #3   Title Patient will reduce timed up  and go to <11 seconds to reduce fall risk and demonstrate improved transfer/gait ability.    Time 8    Period Weeks    Status New    Target Date 02/27/20                 Plan - 01/15/20 1038    Clinical Impression Statement Continued to work on multifactorial balance training. Pt challenged most by cable resisted walking  combined with steps/hurdles. Pt continues to work on high level balance, struggles with stepping righting strategies due to limited strength and power. Pt continues to make progress toward goals overall.     Personal Factors and Comorbidities Age    Stability/Clinical Decision Making Stable/Uncomplicated    Clinical Decision Making Low    Rehab Potential Fair    PT Frequency 2x / week    PT Duration 8 weeks    PT Treatment/Interventions Functional mobility training;Balance training;Neuromuscular re-education;Therapeutic exercise;Therapeutic activities;Patient/family education    PT Next Visit Plan Conitnue with balance training    PT Home Exercise Plan No updates, but encouraged compliance    Consulted and Agree with Plan of Care Patient           Patient will benefit from skilled therapeutic intervention in order to improve the following deficits and impairments:  Abnormal gait, Pain, Decreased knowledge of use of DME, Decreased mobility, Decreased activity tolerance, Decreased endurance, Decreased range of motion, Decreased strength  Visit Diagnosis: Other abnormalities of gait and mobility  Difficulty in walking, not elsewhere classified  Muscle weakness (generalized)     Problem List Patient Active Problem List   Diagnosis Date Noted  . Pneumonia due to COVID-19 virus 01/09/2019  . Acute respiratory failure with hypoxia ([place]) 01/09/2019  . Postmenopausal 11/09/2016  . Diverticulitis 08/06/2016  . Gastroesophageal reflux disease without esophagitis 08/06/2016  . Chronic ulcerative colitis, without complications ([place]) 33/38/3291  . Scoliosis of  lumbosacral spine 01/18/2015  . Trochanteric bursitis 11/29/2014  . Esophagitis, reflux 10/04/2014  . Incomplete emptying of bladder 10/29/2013  . Female stress incontinence 08/09/2013  . Urethral prolapse 08/09/2013  . Urge incontinence 08/09/2013  . Migraine 07/26/2013  . Scoliosis (and kyphoscoliosis), idiopathic 07/26/2013  . Ulcerative colitis ([place]) 07/26/2013   11:10 AM, 01/15/20 Etta Grandchild, PT, DPT Physical Therapist - [facility]  Outpatient Physical [place] [phone]     Etta Grandchild 01/15/2020, 10:42 AM  [place] MAIN [facility] SERVICES [address] [place], [place], [address] Phone: [phone]   Fax:  [phone]  Name: April Larsen MRN: [id number] Date of Birth: 02/12/1946

## 2020-01-17 ENCOUNTER — Other Ambulatory Visit: Payer: Self-pay

## 2020-01-17 ENCOUNTER — Ambulatory Visit: Payer: PPO | Admitting: Physical Therapy

## 2020-01-17 DIAGNOSIS — R2689 Other abnormalities of gait and mobility: Secondary | ICD-10-CM | POA: Diagnosis not present

## 2020-01-17 DIAGNOSIS — R262 Difficulty in walking, not elsewhere classified: Secondary | ICD-10-CM

## 2020-01-17 DIAGNOSIS — M6281 Muscle weakness (generalized): Secondary | ICD-10-CM

## 2020-01-17 NOTE — Therapy (Signed)
[place] MAIN [facility] SERVICES [address] [place], [place], [address] Phone: [phone]   Fax:  [phone]  Physical Therapy Treatment  Patient Details  Name: April Larsen MRN: [id number] Date of Birth: 1946/03/09 Referring Provider (PT): Emily Filbert   Encounter Date: 01/17/2020   PT End of Session - 01/17/20 1106    Visit Number 5    Number of Visits 17    Date for PT Re-Evaluation 02/27/20    PT Start Time 1102    PT Stop Time 1145    PT Time Calculation (min) 43 min    Equipment Utilized During Treatment Gait belt    Activity Tolerance Patient tolerated treatment well    Behavior During Therapy [facility] for tasks assessed/performed           Past Medical History:  Diagnosis Date   Actinic keratosis    Anemia    LAST HGB 15.2 ON 04-22-16   Anxiety    Arthritis    Cancer ([place]) 2016   skin   Cataract (lens) fragments in eye following cataract surgery, bilateral 2013   one eye done then 4 weeks later the other eye done.   Chronic ulcerative colitis, without complications ([place]) 06/01/1935   Diverticulitis    Esophagitis, reflux 10/04/2014   Family history of adverse reaction to anesthesia    SISTER HARD TO WAKE UP and nausea and vomiting.   Female stress incontinence 08/09/2013   Gastroesophageal reflux disease without esophagitis 08/06/2016   GERD (gastroesophageal reflux disease)    Headache    MIGRAINE   History of hiatal hernia    Incomplete emptying of bladder 10/29/2013   Migraine 07/26/2013   PONV (postoperative nausea and vomiting)    nausea, does not remember vomiting.   Postherpetic neuralgia    RIGHT 7TH.CRANIAL NERVE from shingles followed by Bell's palsy   Postmenopausal 11/09/2016   PVC (premature ventricular contraction)    Scoliosis    Scoliosis (and kyphoscoliosis), idiopathic 07/26/2013   Scoliosis of lumbosacral spine 01/18/2015   Squamous cell carcinoma of skin 03/13/2013   L  clavicle    Squamous cell carcinoma of skin 03/10/2018   L med knee   Squamous cell carcinoma of skin 02/21/2019   R cheek    Squamous cell carcinoma of skin 06/01/2019   L mid lat pretibial - ED&C   Trochanteric bursitis 11/29/2014   Ulcerative colitis ([place])    Urethral prolapse 08/09/2013   Urge incontinence 08/09/2013    Past Surgical History:  Procedure Laterality Date   ABDOMINAL HYSTERECTOMY     BACK SURGERY     L3-L4 AND L3-S1 DECOMPRESSION   CARPAL TUNNEL RELEASE     CHOLECYSTECTOMY N/A 08/21/2016   Procedure: LAPAROSCOPIC CHOLECYSTECTOMY WITH INTRAOPERATIVE CHOLANGIOGRAM;  Surgeon: Leonie Green, MD;  Location: [facility] ORS;  Service: General;  Laterality: N/A;   COLONOSCOPY  11/06/2005   COLONOSCOPY WITH PROPOFOL N/A 07/29/2016   Procedure: COLONOSCOPY WITH PROPOFOL;  Surgeon: Manya Silvas, MD;  Location: [facility] ENDOSCOPY;  Service: Endoscopy;  Laterality: N/A;   ESOPHAGOGASTRODUODENOSCOPY  11/06/2005   ESOPHAGOGASTRODUODENOSCOPY (EGD) WITH PROPOFOL N/A 07/29/2016   Procedure: ESOPHAGOGASTRODUODENOSCOPY (EGD) WITH PROPOFOL;  Surgeon: Manya Silvas, MD;  Location: [facility] ENDOSCOPY;  Service: Endoscopy;  Laterality: N/A;   LAPAROSCOPIC LOW ANTERIOR RESECTION  11/24/2016   Procedure: LAPAROSCOPIC LOW ANTERIOR RESECTION;  Surgeon: Jules Husbands, MD;  Location: [facility] ORS;  Service: General;;    There were no vitals filed for this visit.  Subjective Assessment - 01/17/20 1106    Subjective Patient reports having some balance problems.She feels that she stumbles and has had 4 falls in the last 6 months.    Pertinent History Per MD note:Patient presents for intermittent ear pain and sore throat over the last week. She reports she has pain in her left ear that radiates to the left side of her throat. She is able to eat and drink without issues. She has had nausea this morning. She denies hearing loss, dizziness, and ear drainage. She has not been around any other sick  contacts. She has completed Pfizer vaccine, last dose was in March. She had Covid in October of last year and was hospitalized for 1 week. She denies fever, chills, sinus pressure, cough, congestion, chest pain, vomiting, diarrhea, GU symptoms, dizziness, weakness, syncope.    Patient Stated Goals to be able to stop falling and stumbling    Currently in Pain? No/denies    Pain Score 0-No pain           Treatment: Neuromuscular Training: Stool: staggered stance, head turns side/side, up/down x 5 reps each, each foot in front; VCs for proper technique and positioning for each exercise staggered stance, trunk rotation with theraball,  VC to keep UE straight and turn head with trunk  Hurdle: Forward and backward stepping over hurdle x15 each direction, VCs to take big enough steps and to try to increase speed to work on coordination  Side stepping over hurdle 15x each direction  Blue Foam: Side stepping x10 on blue balance CGA for safety, VCs for taking a big enough step  Airex pad trunk rotation x2 min, CGA for safety, demonstrated difficulty with keeping arms extended and full rotation with head turn  BOSU: Lunge to BOSU ball x 10 , cues for going slow and to control the speed   Matrix: Fwd/bwd gait with 22. 5 lbs and CGA, cues for posture and stepping strategies, occasional LOB Side stepping left and right and CGA with cues to slow movement  Pt educated throughout session about proper posture and technique with exercises. Improved exercise technique, movement at target joints, use of target muscles after min to mod verbal, visual, tactile cues. CGA verbal cues used throughout with increased in postural sway and LOB most seen with narrow base of support and while on uneven surfaces. Continues to have balance deficits typical with diagnosis.                         PT Education - 01/17/20 1106    Education Details HEP    Person(s) Educated Patient    Methods  Explanation    Comprehension Verbalized understanding;Need further instruction            PT Short Term Goals - 01/02/20 1339      PT SHORT TERM GOAL #1   Title Patient will be independent in home exercise program to improve strength/mobility for better functional independence with ADLs.    Time 4    Period Weeks    Status New    Target Date 01/30/20      PT SHORT TERM GOAL #2   Title Patient (> 40 years old) will complete five times sit to stand test in < 15 seconds indicating an increased LE strength and improved balance.    Time 4    Period Weeks    Status New    Target Date 01/30/20  PT Long Term Goals - 01/02/20 1340      PT LONG TERM GOAL #1   Title Patient will increase Berg Balance score by > 6 points to demonstrate decreased fall risk during functional activities.    Time 8    Period Weeks    Status New    Target Date 02/27/20      PT LONG TERM GOAL #2   Title Patient will increase 10 meter walk test to >1.16ms as to improve gait speed for better community ambulation and to reduce fall risk.    Time 8    Period Weeks    Status New    Target Date 02/27/20      PT LONG TERM GOAL #3   Title Patient will reduce timed up and go to <11 seconds to reduce fall risk and demonstrate improved transfer/gait ability.    Time 8    Period Weeks    Status New    Target Date 02/27/20                 Plan - 01/17/20 1107    Clinical Impression Statement Patient instructed in advanced LE strengthening and intermediate balance exercise. Patient required min VCS to improve weight shift and to increase ankle strategies  for better stance control. Patient would benefit from additional skilled PT intervention to improve strength, balance and gait safety.   Personal Factors and Comorbidities Age    Stability/Clinical Decision Making Stable/Uncomplicated    Rehab Potential Fair    PT Frequency 2x / week    PT Duration 8 weeks    PT Treatment/Interventions  Functional mobility training;Balance training;Neuromuscular re-education;Therapeutic exercise;Therapeutic activities;Patient/family education    PT Next Visit Plan Conitnue with balance training    PT Home Exercise Plan No updates, but encouraged compliance    Consulted and Agree with Plan of Care Patient           Patient will benefit from skilled therapeutic intervention in order to improve the following deficits and impairments:  Abnormal gait, Pain, Decreased knowledge of use of DME, Decreased mobility, Decreased activity tolerance, Decreased endurance, Decreased range of motion, Decreased strength  Visit Diagnosis: Other abnormalities of gait and mobility  Difficulty in walking, not elsewhere classified  Muscle weakness (generalized)     Problem List Patient Active Problem List   Diagnosis Date Noted   Pneumonia due to COVID-19 virus 01/09/2019   Acute respiratory failure with hypoxia ([place] 01/09/2019   Postmenopausal 11/09/2016   Diverticulitis 08/06/2016   Gastroesophageal reflux disease without esophagitis 08/06/2016   Chronic ulcerative colitis, without complications ([place] 038/38/1840  Scoliosis of lumbosacral spine 01/18/2015   Trochanteric bursitis 11/29/2014   Esophagitis, reflux 10/04/2014   Incomplete emptying of bladder 10/29/2013   Female stress incontinence 08/09/2013   Urethral prolapse 08/09/2013   Urge incontinence 08/09/2013   Migraine 07/26/2013   Scoliosis (and kyphoscoliosis), idiopathic 07/26/2013   [place]cerative colitis ([place] 07/26/2013    MAlanson Puls PT DPT 01/17/2020, 11:08 AM  [place] [facility] [address] [place] [address]Phone: [phone]  Fax:  [phone] Name: April GoetschMRN: [id number] of Birth: [birth year]

## 2020-01-31 ENCOUNTER — Telehealth: Payer: Self-pay | Admitting: Cardiology

## 2020-01-31 ENCOUNTER — Ambulatory Visit (INDEPENDENT_AMBULATORY_CARE_PROVIDER_SITE_OTHER): Payer: PPO | Admitting: Surgery

## 2020-01-31 ENCOUNTER — Other Ambulatory Visit: Payer: Self-pay

## 2020-01-31 ENCOUNTER — Telehealth: Payer: Self-pay

## 2020-01-31 ENCOUNTER — Encounter: Payer: Self-pay | Admitting: Surgery

## 2020-01-31 DIAGNOSIS — R1013 Epigastric pain: Secondary | ICD-10-CM | POA: Diagnosis not present

## 2020-01-31 NOTE — Progress Notes (Signed)
Patient ID: April Larsen, female   DOB: 1945-04-04, 74 y.o.   MRN: [id number]  HPI April Larsen is a 74 y.o. female seen in consultation at the request of Dr. Sabra Heck.  She does have a known history of significant hiatal hernia with significant symptoms.  She experiences intermittent reflux symptoms.  More recently she experienced some retrosternal pain that lasted for about 30 to 45 minutes.  Went to her primary care doctor showing no evidence of cardiac ischemia.  She did have a CT scan in 2018 showing a large paraesophageal hernia with at least half of her stomach within the mediastinum.  She denies any prior hiatal hernia repairs.  I actually perform more than 3 years ago a sigmoid colectomy and she has done very well.  She also has severe scoliosis but does not have any significant pulmonary symptoms.  She is currently able to perform more than a METS of activity without any shortness of breath or chest pain. CBC and BMP were nml Last endoscopy performed 2018 by Dr. Tiffany Kocher showing hiatal hernia. Fam hx Father died of MI. She did have COvid last year and has recovered.  Does not seem to have any baseline pulmonary dysfunction or issues despite severe scoliosis  HPI  Past Medical History:  Diagnosis Date  . Actinic keratosis   . Anemia    LAST HGB 15.2 ON 04-22-16  . Anxiety   . Arthritis   . Cancer ([place]) 2016   skin  . Cataract (lens) fragments in eye following cataract surgery, bilateral 2013   one eye done then 4 weeks later the other eye done.  . Chronic ulcerative colitis, without complications ([place]) 4/0/9811  . Diverticulitis   . Esophagitis, reflux 10/04/2014  . Family history of adverse reaction to anesthesia    SISTER HARD TO WAKE UP and nausea and vomiting.  . Female stress incontinence 08/09/2013  . Gastroesophageal reflux disease without esophagitis 08/06/2016  . GERD (gastroesophageal reflux disease)   . Headache    MIGRAINE  . History of hiatal hernia   .  Incomplete emptying of bladder 10/29/2013  . Migraine 07/26/2013  . PONV (postoperative nausea and vomiting)    nausea, does not remember vomiting.  Marland Kitchen Postherpetic neuralgia    RIGHT 7TH.CRANIAL NERVE from shingles followed by Bell's palsy  . Postmenopausal 11/09/2016  . PVC (premature ventricular contraction)   . Scoliosis   . Scoliosis (and kyphoscoliosis), idiopathic 07/26/2013  . Scoliosis of lumbosacral spine 01/18/2015  . Squamous cell carcinoma of skin 03/13/2013   L clavicle   . Squamous cell carcinoma of skin 03/10/2018   L med knee  . Squamous cell carcinoma of skin 02/21/2019   R cheek   . Squamous cell carcinoma of skin 06/01/2019   L mid lat pretibial - ED&C  . Trochanteric bursitis 11/29/2014  . Ulcerative colitis ([place])   . Urethral prolapse 08/09/2013  . Urge incontinence 08/09/2013    Past Surgical History:  Procedure Laterality Date  . ABDOMINAL HYSTERECTOMY    . BACK SURGERY     L3-L4 AND L3-S1 DECOMPRESSION  . CARPAL TUNNEL RELEASE    . CHOLECYSTECTOMY N/A 08/21/2016   Procedure: LAPAROSCOPIC CHOLECYSTECTOMY WITH INTRAOPERATIVE CHOLANGIOGRAM;  Surgeon: Leonie Green, MD;  Location: [facility] ORS;  Service: General;  Laterality: N/A;  . COLONOSCOPY  11/06/2005  . COLONOSCOPY WITH PROPOFOL N/A 07/29/2016   Procedure: COLONOSCOPY WITH PROPOFOL;  Surgeon: Manya Silvas, MD;  Location: [facility] ENDOSCOPY;  Service: Endoscopy;  Laterality: N/A;  .  ESOPHAGOGASTRODUODENOSCOPY  11/06/2005  . ESOPHAGOGASTRODUODENOSCOPY (EGD) WITH PROPOFOL N/A 07/29/2016   Procedure: ESOPHAGOGASTRODUODENOSCOPY (EGD) WITH PROPOFOL;  Surgeon: Manya Silvas, MD;  Location: [facility] ENDOSCOPY;  Service: Endoscopy;  Laterality: N/A;  . LAPAROSCOPIC LOW ANTERIOR RESECTION  11/24/2016   Procedure: LAPAROSCOPIC LOW ANTERIOR RESECTION;  Surgeon: Jules Husbands, MD;  Location: [facility] ORS;  Service: General;;    Family History  Problem Relation Age of Onset  . Breast cancer Sister 79  . Breast cancer  Paternal Aunt 32  . Breast cancer Cousin        1st paternal cousin    Social History Social History   Tobacco Use  . Smoking status: Never Smoker  . Smokeless tobacco: Never Used  Vaping Use  . Vaping Use: Never used  Substance Use Topics  . Alcohol use: No  . Drug use: No    Allergies  Allergen Reactions  . Nsaids Other (See Comments)    Patient has had part of her colon removed   . Sulfa Antibiotics Rash    Current Outpatient Medications  Medication Sig Dispense Refill  . acetaminophen (TYLENOL) 500 MG tablet Take 1,000 mg by mouth every 6 (six) hours as needed for moderate pain or headache.     . Calcium Carbonate-Vitamin D (CALCIUM 600+D PO) Take 1 tablet by mouth 2 (two) times daily.    . cholecalciferol (VITAMIN D) 1000 units tablet Take 2,000 Units by mouth daily.    . DULoxetine (CYMBALTA) 60 MG capsule Take 60 mg by mouth at bedtime.   3  . ferrous sulfate 325 (65 FE) MG tablet Take 325 mg by mouth daily.     Marland Kitchen omeprazole (PRILOSEC) 20 MG capsule Take 20 mg by mouth 2 (two) times daily.  3  . PREVNAR 13 SUSP injection ADM 0.5ML IM UTD  0  . saccharomyces boulardii (FLORASTOR) 250 MG capsule Take 250 mg by mouth daily.    Marland Kitchen SHINGRIX injection ADM 0.5ML IM UTD  0  . traMADol (ULTRAM) 50 MG tablet Take 50 mg by mouth 3 (three) times daily.    Marland Kitchen buPROPion (WELLBUTRIN XL) 150 MG 24 hr tablet Take 150 mg by mouth daily.     No current facility-administered medications for this visit.     Review of Systems Full ROS  was asked and was negative except for the information on the HPI  Physical Exam Blood pressure 131/82, pulse 89, temperature 97.9 F (36.6 C), temperature source Oral, resp. rate 12, height 5' 6"  (1.676 m), weight 181 lb (82.1 kg), SpO2 97 %. CONSTITUTIONAL:NAD EYES: Pupils are equal, round, , Sclera are non-icteric. EARS, NOSE, MOUTH AND THROAT: Wearing a mask, Hearing is intact to voice. LYMPH NODES:  Lymph nodes in the neck are  normal. RESPIRATORY:  Lungs are clear. There is normal respiratory effort, with equal breath sounds bilaterally, and without pathologic use of accessory muscles. CARDIOVASCULAR: Heart is regular without murmurs, gallops, or rubs. GI: The abdomen issoft, nontender, and nondistended.  Prior mid line mini laparotomy scar. There are no palpable masses. There is no hepatosplenomegaly. There are normal bowel sounds in all quadrants. GU: Rectal deferred.   MUSCULOSKELETAL: Normal muscle strength and tone. No cyanosis or edema.   SKIN: Turgor is good and there are no pathologic skin lesions or ulcers. NEUROLOGIC: Motor and sensation is grossly normal. Cranial nerves are grossly intact. PSYCH:  Oriented to person, place and time. Affect is normal.   I have personally reviewed the patient's imaging, laboratory findings  and medical records.    Assessment/Plan Large type III paraesophageal hernia that is symptomatic . D/W the pt in detail about her disease process.  I do think that the atypical chest pain that she is experiences it is attributed to the large paraesophageal hernia.  I do think that she will require repair given sxs. We will start w/u with CT Chest A/P as well as barium swallow. We will need another endoscopic evaluation since it has been more than 3 years  I d/w her about options for repair and that robotic approach will be recommended. THere is no need for immediate intervention at his time.  Given also risks factors and atypical chest pain I do think that cards evaluation would be indicated. SHe does have scoliosis and this has been associated w worse morbidity regarding perioperative respiratory complications.  Time spent with the patient was 60 minutes, with more than 50% of the time spent in face-to-face education, counseling and care coordination.     Caroleen Hamman, MD FACS General Surgeon 01/31/2020, 1:54 PM

## 2020-01-31 NOTE — Telephone Encounter (Signed)
Patient notified of GI appointment with Dr. Bosie Helper @ 1:15pm.

## 2020-01-31 NOTE — Progress Notes (Signed)
Cardiac clearance faxed to [facility] care [facility] at this time- scheduled patient appointment for 02/02/20 @ 2pm.   Ct scheduled 02/09/20 @ 1pm. Barium swallow scheduled 02/16/20 @ 9am Dr.Pabon 02/26/20.

## 2020-01-31 NOTE — Telephone Encounter (Signed)
   [facility] Pre-operative Risk Assessment    HEARTCARE STAFF: - Please ensure there is not already an duplicate clearance open for this procedure. - Under Visit Info/Reason for Call, type in Other and utilize the format Clearance MM/DD/YY or Clearance TBD. Do not use dashes or single digits. - If request is for dental extraction, please clarify the # of teeth to be extracted.  Request for surgical clearance:  1. What type of surgery is being performed? Hiatal hernia repair  2. When is this surgery scheduled? TBD  3. What type of clearance is required (medical clearance vs. Pharmacy clearance to hold med vs. Both)? both  4. Are there any medications that need to be held prior to surgery and how long? Not noted  5. Practice name and name of physician performing surgery? [facility] Dr. Bea Graff Pabon  6. What is the office phone number? [phone]   7.   What is the office fax number? [phone]  8.   Anesthesia type (None, local, MAC, general) ? Not noted  Patient is scheduled for a new patient appt for 11/5 with Dr. Lorna Few R Bumgarner 01/31/2020, 1:45 PM  _________________________________________________________________   (provider comments below)

## 2020-01-31 NOTE — Patient Instructions (Addendum)
Cardiac Clearance faxed to [facility] .   Scheduled appointment  with [facility] on 11/ 5/21 @ 2pm.  Check in [facility] and let them know you are there for Heart care.  CT scheduled @ [facility] on 02/09/20 @ 1:00pm. Please go to their location today to pick up prep. Nothing but liquids 4 hours prior.   Barium swallow scheduled @ [facility] on 02/16/20 @ 9am.   Please be sure to see the Cardiologist prior to your follow up with Dr.Pabon .          Constipation, Adult Constipation is when a person:  Poops (has a bowel movement) fewer times in a week than normal.  Has a hard time pooping.  Has poop that is dry, hard, or bigger than normal. Follow these instructions at home: Eating and drinking   Eat foods that have a lot of fiber, such as: ? Fresh fruits and vegetables. ? Whole grains. ? Beans.  Eat less of foods that are high in fat, low in fiber, or overly processed, such as: ? [place] fries. ? Hamburgers. ? Cookies. ? Candy. ? Soda.  Drink enough fluid to keep your pee (urine) clear or pale yellow. General instructions  Exercise regularly or as told by your doctor.  Go to the restroom when you feel like you need to poop. Do not hold it in.  Take over-the-counter and prescription medicines only as told by your doctor. These include any fiber supplements.  Do pelvic floor retraining exercises, such as: ? Doing deep breathing while relaxing your lower belly (abdomen). ? Relaxing your pelvic floor while pooping.  Watch your condition for any changes.  Keep all follow-up visits as told by your doctor. This is important. Contact a doctor if:  You have pain that gets worse.  You have a fever.  You have not pooped for 4 days.  You throw up (vomit).  You are not hungry.  You lose weight.  You are bleeding from the anus.  You have thin, pencil-like poop (stool). Get help right away if:  You have a fever, and your symptoms suddenly  get worse.  You leak poop or have blood in your poop.  Your belly feels hard or bigger than normal (is bloated).  You have very bad belly pain.  You feel dizzy or you faint. This information is not intended to replace advice given to you by your health care provider. Make sure you discuss any questions you have with your health care provider. Document Revised: 02/26/2017 Document Reviewed: 09/04/2015 Elsevier Patient Education  2020 [employer].

## 2020-02-01 ENCOUNTER — Telehealth: Payer: Self-pay

## 2020-02-01 ENCOUNTER — Encounter: Payer: Self-pay | Admitting: Gastroenterology

## 2020-02-01 ENCOUNTER — Ambulatory Visit: Payer: PPO | Admitting: Gastroenterology

## 2020-02-01 ENCOUNTER — Other Ambulatory Visit: Payer: Self-pay

## 2020-02-01 VITALS — BP 130/65 | HR 92 | Temp 98.4°F | Ht 66.0 in | Wt 181.0 lb

## 2020-02-01 DIAGNOSIS — K5190 Ulcerative colitis, unspecified, without complications: Secondary | ICD-10-CM | POA: Diagnosis not present

## 2020-02-01 DIAGNOSIS — K449 Diaphragmatic hernia without obstruction or gangrene: Secondary | ICD-10-CM | POA: Diagnosis not present

## 2020-02-01 NOTE — Progress Notes (Signed)
April Larsen [address]  [place]  [place], [place] [address]  Main: [phone]  Fax: [phone]   Gastroenterology Consultation  Referring Provider:     Rusty Aus, MD Primary Care Physician:  Rusty Aus, MD Reason for Consultation:     Acid reflux        HPI:    Chief Complaint  Patient presents with   Gastroesophageal Reflux   Hiatal Hernia    April Larsen is a 74 y.o. y/o female referred for consultation & management  by Dr. Sabra Heck, Christean Grief, MD.  Patient recently seen by Dr. Dahlia Byes for evaluation of large hiatal hernia.  He is planning on surgical repair, but is recommending endoscopy prior to the surgery.  Patient reports frequent belching, atypical chest pain intermittently.  Takes PPI for indigestion which controls it well.  No nausea or vomiting.  No weight loss.  No dysphagia.  Has previously had upper endoscopies and colonoscopies by Dr. Vira Agar.  Last one was in 2018 that showed a large hiatal hernia and was otherwise normal  Patient has a history of chronic ulcerative colitis documented in her chart.  On further review of her previous documentation, she was seen by [facility] clinic GI for this but the history is somewhat unclear.  She has not been on any UC meds for years.I am able to see a pathology report from 2013 in her chart that shows mild chronic inactive colitis in all the biopsy specimens.  Procedure report not available.  She had a colonoscopy in 2018 that was normal with biopsies showing unremarkable colonic mucosa throughout.  She also had laparoscopic sigmoid resection for diverticulitis in 2018.  As per February 2018 note by Denice Paradise, Dr. Percell Boston PA, "Patient was hospitalized for C-diff colitis in 2010. Her colonoscopy 02/08/09 showing diverticulosis in the sigmoid colon, descending colon, transverse colon, internal hemorrhoids, and colon otherwise appeared normal. Biopsies of the ileocecal valve showed mild active  colitis with architectural features of chronicity.The patient was having a lot of diarrhea at the time, quite symptomatic, with elevated ESR/CRP and Dr. Vira Agar felt the colonoscopy was not that impressive. She was given Entocort."  Assessment plan on that same note advises to stop the Entocort.  ?So essentially patient was on Entocort without a clear diagnosis from 2010 to 2018   Past Medical History:  Diagnosis Date   Actinic keratosis    Anemia    LAST HGB 15.2 ON 04-22-16   Anxiety    Arthritis    Cancer ([place]) 2016   skin   Cataract (lens) fragments in eye following cataract surgery, bilateral 2013   one eye done then 4 weeks later the other eye done.   Chronic ulcerative colitis, without complications ([place]) 07/30/8411   Diverticulitis    Esophagitis, reflux 10/04/2014   Family history of adverse reaction to anesthesia    SISTER HARD TO WAKE UP and nausea and vomiting.   Female stress incontinence 08/09/2013   Gastroesophageal reflux disease without esophagitis 08/06/2016   GERD (gastroesophageal reflux disease)    Headache    MIGRAINE   History of hiatal hernia    Incomplete emptying of bladder 10/29/2013   Migraine 07/26/2013   PONV (postoperative nausea and vomiting)    nausea, does not remember vomiting.   Postherpetic neuralgia    RIGHT 7TH.CRANIAL NERVE from shingles followed by Bell's palsy   Postmenopausal 11/09/2016   PVC (premature ventricular contraction)    Scoliosis    Scoliosis (  and kyphoscoliosis), idiopathic 07/26/2013   Scoliosis of lumbosacral spine 01/18/2015   Squamous cell carcinoma of skin 03/13/2013   L clavicle    Squamous cell carcinoma of skin 03/10/2018   L med knee   Squamous cell carcinoma of skin 02/21/2019   R cheek    Squamous cell carcinoma of skin 06/01/2019   L mid lat pretibial - ED&C   Trochanteric bursitis 11/29/2014   Ulcerative colitis ([place])    Urethral prolapse 08/09/2013   Urge incontinence 08/09/2013     Past Surgical History:  Procedure Laterality Date   ABDOMINAL HYSTERECTOMY     BACK SURGERY     L3-L4 AND L3-S1 DECOMPRESSION   CARPAL TUNNEL RELEASE     CHOLECYSTECTOMY N/A 08/21/2016   Procedure: LAPAROSCOPIC CHOLECYSTECTOMY WITH INTRAOPERATIVE CHOLANGIOGRAM;  Surgeon: Leonie Green, MD;  Location: [facility] ORS;  Service: General;  Laterality: N/A;   COLONOSCOPY  11/06/2005   COLONOSCOPY WITH PROPOFOL N/A 07/29/2016   Procedure: COLONOSCOPY WITH PROPOFOL;  Surgeon: Manya Silvas, MD;  Location: [facility] ENDOSCOPY;  Service: Endoscopy;  Laterality: N/A;   ESOPHAGOGASTRODUODENOSCOPY  11/06/2005   ESOPHAGOGASTRODUODENOSCOPY (EGD) WITH PROPOFOL N/A 07/29/2016   Procedure: ESOPHAGOGASTRODUODENOSCOPY (EGD) WITH PROPOFOL;  Surgeon: Manya Silvas, MD;  Location: [facility] ENDOSCOPY;  Service: Endoscopy;  Laterality: N/A;   LAPAROSCOPIC LOW ANTERIOR RESECTION  11/24/2016   Procedure: LAPAROSCOPIC LOW ANTERIOR RESECTION;  Surgeon: Jules Husbands, MD;  Location: [facility] ORS;  Service: General;;    Prior to Admission medications   Medication Sig Start Date End Date Taking? Authorizing Provider  acetaminophen (TYLENOL) 500 MG tablet Take 1,000 mg by mouth every 6 (six) hours as needed for moderate pain or headache.    Yes [provider]  buPROPion (WELLBUTRIN XL) 150 MG 24 hr tablet Take 150 mg by mouth daily. 12/08/18 02/01/20 Yes [provider]  Calcium Carbonate-Vitamin D (CALCIUM 600+D PO) Take 1 tablet by mouth 2 (two) times daily.   Yes [provider]  cholecalciferol (VITAMIN D) 1000 units tablet Take 2,000 Units by mouth daily.   Yes [provider]  DULoxetine (CYMBALTA) 60 MG capsule Take 60 mg by mouth at bedtime.  06/17/15  Yes [provider]  ferrous sulfate 325 (65 FE) MG tablet Take 325 mg by mouth daily.    Yes [provider]  omeprazole (PRILOSEC) 20 MG capsule Take 20 mg by mouth 2 (two) times daily. 07/29/15  Yes [provider]  saccharomyces boulardii (FLORASTOR) 250 MG capsule Take 250 mg by mouth daily.   Yes [provider]  traMADol (ULTRAM) 50 MG tablet Take 50 mg by mouth 3 (three) times daily. 11/27/18  Yes [provider]    Family History  Problem Relation Age of Onset   Breast cancer Sister 45   Breast cancer Paternal Aunt 29   Breast cancer Cousin        1st paternal cousin     Social History   Tobacco Use   Smoking status: Never Smoker   Smokeless tobacco: Never Used  Vaping Use   Vaping Use: Never used  Substance Use Topics   Alcohol use: No   Drug use: No    Allergies as of 02/01/2020 - Review Complete 02/01/2020  Allergen Reaction Noted   Nsaids Other (See Comments) 01/05/2020   Sulfa antibiotics Rash 08/24/2015    Review of Systems:    All systems reviewed and negative except where noted in HPI.   Physical Exam:  BP 130/65  Pulse 92    Temp 98.4 F (36.9 C) (Oral)    Ht 5' 6"  (1.676 m)    Wt 181 lb (82.1 kg)    BMI 29.21 kg/m  No LMP recorded. Patient has had a hysterectomy. Psych:  Alert and cooperative. Normal mood and affect. General:   Alert,  Well-developed, well-nourished, pleasant and cooperative in NAD Head:  Normocephalic and atraumatic. Eyes:  Sclera clear, no icterus.   Conjunctiva pink. Ears:  Normal auditory acuity. Nose:  No deformity, discharge, or lesions. Mouth:  No deformity or lesions,oropharynx pink & moist. Neck:  Supple; no masses or thyromegaly. Abdomen:  Normal bowel sounds.  No bruits.  Soft, non-tender and non-distended without masses, hepatosplenomegaly or hernias noted.  No guarding or rebound tenderness.    Msk:  Symmetrical without gross deformities. Good, equal movement & strength bilaterally. Pulses:  Normal pulses noted. Extremities:  No clubbing or edema.  No cyanosis. Neurologic:  Alert and oriented x3;  grossly normal neurologically. Skin:  Intact without significant lesions or rashes. No  jaundice. Lymph Nodes:  No significant cervical adenopathy. Psych:  Alert and cooperative. Normal mood and affect.   Labs: CBC    Component Value Date/Time   WBC 14.2 (H) 01/14/2019 0106   RBC 4.24 01/14/2019 0106   HGB 12.9 01/14/2019 0106   HGB 13.7 05/18/2013 0741   HCT 38.8 01/14/2019 0106   HCT 41.1 05/18/2013 0741   PLT 477 (H) 01/14/2019 0106   PLT 372 05/18/2013 0741   MCV 91.5 01/14/2019 0106   MCV 88 05/18/2013 0741   MCH 30.4 01/14/2019 0106   MCHC 33.2 01/14/2019 0106   RDW 13.2 01/14/2019 0106   RDW 14.0 05/18/2013 0741   LYMPHSABS 0.7 01/14/2019 0106   LYMPHSABS 2.0 05/18/2013 0741   MONOABS 0.7 01/14/2019 0106   MONOABS 1.2 (H) 05/18/2013 0741   EOSABS 0.0 01/14/2019 0106   EOSABS 0.1 05/18/2013 0741   BASOSABS 0.1 01/14/2019 0106   BASOSABS 0.1 05/18/2013 0741   CMP     Component Value Date/Time   NA 136 01/14/2019 0106   NA 140 03/29/2012 1120   K 4.0 01/14/2019 0106   K 3.7 03/29/2012 1120   CL 104 01/14/2019 0106   CL 110 (H) 03/29/2012 1120   CO2 20 (L) 01/14/2019 0106   CO2 20 (L) 03/29/2012 1120   GLUCOSE 159 (H) 01/14/2019 0106   GLUCOSE 70 03/29/2012 1120   BUN 24 (H) 01/14/2019 0106   BUN 17 03/29/2012 1120   CREATININE 0.78 01/14/2019 0106   CREATININE 0.74 03/29/2012 1120   CALCIUM 8.5 (L) 01/14/2019 0106   CALCIUM 8.9 03/29/2012 1120   PROT 5.9 (L) 01/14/2019 0106   PROT 7.5 03/29/2012 1120   ALBUMIN 2.7 (L) 01/14/2019 0106   ALBUMIN 4.1 03/29/2012 1120   AST 16 01/14/2019 0106   AST 25 03/29/2012 1120   ALT 23 01/14/2019 0106   ALT 29 03/29/2012 1120   ALKPHOS 67 01/14/2019 0106   ALKPHOS 124 03/29/2012 1120   BILITOT 0.6 01/14/2019 0106   BILITOT 0.3 03/29/2012 1120   GFRNONAA >60 01/14/2019 0106   GFRNONAA >60 03/29/2012 1120   GFRAA >60 01/14/2019 0106   GFRAA >60 03/29/2012 1120    Imaging Studies: No results found.  Assessment and Plan:   April Larsen is a 74 y.o. y/o female has been referred for  reflux  Patient with large hiatal hernia and is in need of upper endoscopy at the request of surgery, prior  to surgical repair  We will schedule as soon as possible, at [facility] to allow her to proceed with her planned surgical repair  Her previous history of UC seems to be in clinical remission (with no active symptoms, and both endoscopic and histologic remission in 2018), however, the history itself is not clear.  We will try to obtain records from [place] clinic for this for further evaluation.  However, given that she does not have any symptoms, this would be best addressed after her hiatal hernia surgery, and would not interfere with her surgery.    Dr April Larsen  Speech recognition software was used to dictate the above note.

## 2020-02-01 NOTE — Telephone Encounter (Signed)
Medical record release of information was faxed to [place]. Awaiting records.

## 2020-02-01 NOTE — Telephone Encounter (Signed)
Patient is scheduled to see Dr. Garen Lah tomorrow for cardiac clearance. Will defer cardiac clearance to MD and remove from preop pool.

## 2020-02-02 ENCOUNTER — Other Ambulatory Visit: Payer: Self-pay

## 2020-02-02 ENCOUNTER — Ambulatory Visit: Payer: PPO | Admitting: Cardiology

## 2020-02-02 ENCOUNTER — Encounter: Payer: Self-pay | Admitting: Cardiology

## 2020-02-02 VITALS — BP 138/98 | HR 88 | Ht 66.0 in | Wt 181.0 lb

## 2020-02-02 DIAGNOSIS — Z01810 Encounter for preprocedural cardiovascular examination: Secondary | ICD-10-CM

## 2020-02-02 DIAGNOSIS — K2100 Gastro-esophageal reflux disease with esophagitis, without bleeding: Secondary | ICD-10-CM | POA: Diagnosis not present

## 2020-02-02 DIAGNOSIS — Z01818 Encounter for other preprocedural examination: Secondary | ICD-10-CM

## 2020-02-02 NOTE — Patient Instructions (Signed)
Medication Instructions:  No Changes *If you need a refill on your cardiac medications before your next appointment, please call your pharmacy*   Lab Work: None Ordered If you have labs (blood work) drawn today and your tests are completely normal, you will receive your results only by: Marland Kitchen MyChart Message (if you have MyChart) OR . A paper copy in the mail If you have any lab test that is abnormal or we need to change your treatment, we will call you to review the results.   Testing/Procedures: None Ordered   Follow-Up: At [facility], you and your health needs are our priority.  As part of our continuing mission to provide you with exceptional heart care, we have created designated Provider Care Teams.  These Care Teams include your primary Cardiologist (physician) and Advanced Practice Providers (APPs -  Physician Assistants and Nurse Practitioners) who all work together to provide you with the care you need, when you need it.  We recommend signing up for the patient portal called "MyChart".  Sign up information is provided on this After Visit Summary.  MyChart is used to connect with patients for Virtual Visits (Telemedicine).  Patients are able to view lab/test results, encounter notes, upcoming appointments, etc.  Non-urgent messages can be sent to your provider as well.   To learn more about what you can do with MyChart, go to [email or web address].    Your next appointment:   Follow up as needed   The format for your next appointment:   In Person  Provider:   Kate Sable, MD   Other Instructions

## 2020-02-02 NOTE — Progress Notes (Signed)
Cardiology Office Note:    Date:  02/02/2020   ID:  April Larsen, DOB 09/29/1945, MRN [id number]  PCP:  Rusty Aus, MD  [facility] Cardiologist:  No primary care provider on file.  [facility] Electrophysiologist:  None   Referring MD: Jules Husbands, MD   Chief Complaint  Patient presents with  . New Patient (Initial Visit)    Establish care with provider for surgical clearance of hernia. Medications verbally reviewed with patient.    April Larsen is a 74 y.o. female who is being seen today for cardiac preop evaluation at the request of Pabon, Diego F, MD.   History of Present Illness:    April Larsen is a 74 y.o. female with a hx of GERD, hiatal hernia, anxiety, arthritis who presents for cardiac risk eval prior to surgery.  Patient with history of hiatal hernia, has reflux symptoms lasting over 45 minutes.  CT scan in 2018 showed a large paraesophageal hernia.  She denies any symptoms of chest pain or shortness of breath with ordinary exertion.  Denies any history of cardiac disease.  Denies any cardiac risk factors such as hypertension, diabetes, hyperlipidemia.  Her father passed from what she thinks might be an MI in his 4s.  She otherwise feels well, apart from reflux symptoms.  She is scheduled to get an EGD soon.  Had a colon resection in 2018 without any adverse effects.  Past Medical History:  Diagnosis Date  . Actinic keratosis   . Anemia    LAST HGB 15.2 ON 04-22-16  . Anxiety   . Arthritis   . Cancer ([place]) 2016   skin  . Cataract (lens) fragments in eye following cataract surgery, bilateral 2013   one eye done then 4 weeks later the other eye done.  . Chronic ulcerative colitis, without complications ([place]) 10/05/3901  . Diverticulitis   . Esophagitis, reflux 10/04/2014  . Family history of adverse reaction to anesthesia    SISTER HARD TO WAKE UP and nausea and vomiting.  . Female stress incontinence 08/09/2013  . Gastroesophageal  reflux disease without esophagitis 08/06/2016  . GERD (gastroesophageal reflux disease)   . Headache    MIGRAINE  . History of hiatal hernia   . Incomplete emptying of bladder 10/29/2013  . Migraine 07/26/2013  . PONV (postoperative nausea and vomiting)    nausea, does not remember vomiting.  Marland Kitchen Postherpetic neuralgia    RIGHT 7TH.CRANIAL NERVE from shingles followed by Bell's palsy  . Postmenopausal 11/09/2016  . PVC (premature ventricular contraction)   . Scoliosis   . Scoliosis (and kyphoscoliosis), idiopathic 07/26/2013  . Scoliosis of lumbosacral spine 01/18/2015  . Squamous cell carcinoma of skin 03/13/2013   L clavicle   . Squamous cell carcinoma of skin 03/10/2018   L med knee  . Squamous cell carcinoma of skin 02/21/2019   R cheek   . Squamous cell carcinoma of skin 06/01/2019   L mid lat pretibial - ED&C  . Trochanteric bursitis 11/29/2014  . Ulcerative colitis ([place])   . Urethral prolapse 08/09/2013  . Urge incontinence 08/09/2013    Past Surgical History:  Procedure Laterality Date  . ABDOMINAL HYSTERECTOMY    . BACK SURGERY     L3-L4 AND L3-S1 DECOMPRESSION  . CARPAL TUNNEL RELEASE    . CHOLECYSTECTOMY N/A 08/21/2016   Procedure: LAPAROSCOPIC CHOLECYSTECTOMY WITH INTRAOPERATIVE CHOLANGIOGRAM;  Surgeon: Leonie Green, MD;  Location: [facility] ORS;  Service: General;  Laterality: N/A;  . COLONOSCOPY  11/06/2005  . COLONOSCOPY WITH PROPOFOL N/A 07/29/2016   Procedure: COLONOSCOPY WITH PROPOFOL;  Surgeon: Manya Silvas, MD;  Location: [facility] ENDOSCOPY;  Service: Endoscopy;  Laterality: N/A;  . ESOPHAGOGASTRODUODENOSCOPY  11/06/2005  . ESOPHAGOGASTRODUODENOSCOPY (EGD) WITH PROPOFOL N/A 07/29/2016   Procedure: ESOPHAGOGASTRODUODENOSCOPY (EGD) WITH PROPOFOL;  Surgeon: Manya Silvas, MD;  Location: [facility] ENDOSCOPY;  Service: Endoscopy;  Laterality: N/A;  . LAPAROSCOPIC LOW ANTERIOR RESECTION  11/24/2016   Procedure: LAPAROSCOPIC LOW ANTERIOR RESECTION;  Surgeon: Jules Husbands,  MD;  Location: [facility] ORS;  Service: General;;    Current Medications: Current Meds  Medication Sig  . acetaminophen (TYLENOL) 500 MG tablet Take 1,000 mg by mouth every 6 (six) hours as needed for moderate pain or headache.   Marland Kitchen buPROPion (WELLBUTRIN XL) 150 MG 24 hr tablet Take 150 mg by mouth daily.  . Calcium Carbonate-Vitamin D (CALCIUM 600+D PO) Take 1 tablet by mouth 2 (two) times daily.  . cholecalciferol (VITAMIN D) 1000 units tablet Take 2,000 Units by mouth daily.  . DULoxetine (CYMBALTA) 60 MG capsule Take 60 mg by mouth at bedtime.   Marland Kitchen omeprazole (PRILOSEC) 20 MG capsule Take 20 mg by mouth 2 (two) times daily.  . traMADol (ULTRAM) 50 MG tablet Take 50 mg by mouth 3 (three) times daily.     Allergies:   Nsaids and Sulfa antibiotics   Social History   Socioeconomic History  . Marital status: Divorced    Spouse name: Not on file  . Number of children: Not on file  . Years of education: Not on file  . Highest education level: Not on file  Occupational History  . Not on file  Tobacco Use  . Smoking status: Never Smoker  . Smokeless tobacco: Never Used  Vaping Use  . Vaping Use: Never used  Substance and Sexual Activity  . Alcohol use: No  . Drug use: No  . Sexual activity: Not on file  Other Topics Concern  . Not on file  Social History Narrative  . Not on file   Social Determinants of Health   Financial Resource Strain:   . Difficulty of Paying Living Expenses: Not on file  Food Insecurity:   . Worried About Charity fundraiser in the Last Year: Not on file  . Ran Out of Food in the Last Year: Not on file  Transportation Needs:   . Lack of Transportation (Medical): Not on file  . Lack of Transportation (Non-Medical): Not on file  Physical Activity:   . Days of Exercise per Week: Not on file  . Minutes of Exercise per Session: Not on file  Stress:   . Feeling of Stress : Not on file  Social Connections:   . Frequency of Communication with Friends and  Family: Not on file  . Frequency of Social Gatherings with Friends and Family: Not on file  . Attends Religious Services: Not on file  . Active Member of Clubs or Organizations: Not on file  . Attends Archivist Meetings: Not on file  . Marital Status: Not on file     Family History: The patient's family history includes Breast cancer in her cousin; Breast cancer (age of onset: 24) in her paternal aunt; Breast cancer (age of onset: 78) in her sister.  ROS:   Please see the history of present illness.     All other systems reviewed and are negative.  EKGs/Labs/Other Studies Reviewed:    The following studies were reviewed today:  EKG:  EKG is  ordered today.  The ekg ordered today demonstrates normal sinus rhythm,  Recent Labs: No results found for requested labs within last 8760 hours.  Recent Lipid Panel No results found for: CHOL, TRIG, HDL, CHOLHDL, VLDL, LDLCALC, LDLDIRECT   Risk Assessment/Calculations:      Physical Exam:    VS:  BP (!) 138/98 (BP Location: Right Arm, Patient Position: Sitting, Cuff Size: Normal)   Pulse 88   Ht 5' 6"  (1.676 m)   Wt 181 lb (82.1 kg)   SpO2 97%   BMI 29.21 kg/m     Wt Readings from Last 3 Encounters:  02/02/20 181 lb (82.1 kg)  02/01/20 181 lb (82.1 kg)  01/31/20 181 lb (82.1 kg)     GEN:  Well nourished, well developed in no acute distress HEENT: Normal NECK: No JVD; No carotid bruits LYMPHATICS: No lymphadenopathy CARDIAC: RRR, no murmurs, rubs, gallops RESPIRATORY:  Clear to auscultation without rales, wheezing or rhonchi  ABDOMEN: Soft, non-tender, non-distended MUSCULOSKELETAL:  No edema; No deformity  SKIN: Warm and dry NEUROLOGIC:  Alert and oriented x 3 PSYCHIATRIC:  Normal affect   ASSESSMENT:    1. Pre-op evaluation   2. Gastroesophageal reflux disease with esophagitis without hemorrhage    PLAN:    In order of problems listed above:  1. Patient presenting for preop risk stratification.   Denies any cardiac symptoms of chest pain or shortness of breath.  She has no cardiac risk factors.  Able to perform over 4 METS with daily activities.  RCRI equals 0, MACE equals 0.4%.  Patient is therefore low risk for procedure.  No additional cardiac testing indicated at this time according to [facility] AHA guidelines.  Patient can undergo procedure from a cardiac perspective at acceptable risk. 2. History of reflux, EGD being planned.  Hiatal hernia surgery as per surgical team.  Follow-up as needed   Shared Decision Making/Informed Consent      Medication Adjustments/Labs and Tests Ordered: Current medicines are reviewed at length with the patient today.  Concerns regarding medicines are outlined above.  Orders Placed This Encounter  Procedures  . EKG 12-Lead   No orders of the defined types were placed in this encounter.   Patient Instructions  Medication Instructions:  No Changes *If you need a refill on your cardiac medications before your next appointment, please call your pharmacy*   Lab Work: None Ordered If you have labs (blood work) drawn today and your tests are completely normal, you will receive your results only by: Marland Kitchen MyChart Message (if you have MyChart) OR . A paper copy in the mail If you have any lab test that is abnormal or we need to change your treatment, we will call you to review the results.   Testing/Procedures: None Ordered   Follow-Up: At [facility], you and your health needs are our priority.  As part of our continuing mission to provide you with exceptional heart care, we have created designated Provider Care Teams.  These Care Teams include your primary Cardiologist (physician) and Advanced Practice Providers (APPs -  Physician Assistants and Nurse Practitioners) who all work together to provide you with the care you need, when you need it.  We recommend signing up for the patient portal called "MyChart".  Sign up information is provided on this  After Visit Summary.  MyChart is used to connect with patients for Virtual Visits (Telemedicine).  Patients are able to view lab/test results, encounter notes, upcoming appointments,  etc.  Non-urgent messages can be sent to your provider as well.   To learn more about what you can do with MyChart, go to [email or web address].    Your next appointment:   Follow up as needed   The format for your next appointment:   In Person  Provider:   Kate Sable, MD   Other Instructions      Signed, Kate Sable, MD  02/02/2020 5:36 PM    [place]

## 2020-02-05 ENCOUNTER — Other Ambulatory Visit: Payer: PPO

## 2020-02-05 ENCOUNTER — Other Ambulatory Visit
Admission: RE | Admit: 2020-02-05 | Discharge: 2020-02-05 | Disposition: A | Discharge status: Home / Self Care | Payer: PPO | Source: Ambulatory Visit | Attending: Gastroenterology | Admitting: Gastroenterology

## 2020-02-05 ENCOUNTER — Other Ambulatory Visit: Payer: Self-pay

## 2020-02-05 DIAGNOSIS — Z01818 Encounter for other preprocedural examination: Secondary | ICD-10-CM | POA: Diagnosis not present

## 2020-02-05 DIAGNOSIS — Z20822 Contact with and (suspected) exposure to covid-19: Secondary | ICD-10-CM | POA: Diagnosis not present

## 2020-02-05 LAB — SARS CORONAVIRUS 2 (TAT 6-24 HRS): SARS Coronavirus 2: NEGATIVE

## 2020-02-05 NOTE — Progress Notes (Signed)
Patient was seen by Dr Galen Manila and was cleared at acceptable risk for surgery. All notes are in Epic.

## 2020-02-06 ENCOUNTER — Ambulatory Visit: Payer: PPO | Attending: Internal Medicine | Admitting: Physical Therapy

## 2020-02-06 ENCOUNTER — Other Ambulatory Visit: Payer: Self-pay | Admitting: Internal Medicine

## 2020-02-06 DIAGNOSIS — R262 Difficulty in walking, not elsewhere classified: Secondary | ICD-10-CM | POA: Insufficient documentation

## 2020-02-06 DIAGNOSIS — R2689 Other abnormalities of gait and mobility: Secondary | ICD-10-CM | POA: Insufficient documentation

## 2020-02-06 DIAGNOSIS — M6281 Muscle weakness (generalized): Secondary | ICD-10-CM | POA: Insufficient documentation

## 2020-02-06 DIAGNOSIS — Z1231 Encounter for screening mammogram for malignant neoplasm of breast: Secondary | ICD-10-CM

## 2020-02-07 ENCOUNTER — Encounter
Admission: RE | Disposition: A | Discharge status: Home / Self Care | Payer: Self-pay | Source: Home / Self Care | Attending: Gastroenterology

## 2020-02-07 ENCOUNTER — Ambulatory Visit: Payer: PPO | Admitting: Anesthesiology

## 2020-02-07 ENCOUNTER — Ambulatory Visit
Admission: RE | Admit: 2020-02-07 | Discharge: 2020-02-07 | Disposition: A | Discharge status: Home / Self Care | Payer: PPO | Attending: Gastroenterology | Admitting: Gastroenterology

## 2020-02-07 ENCOUNTER — Encounter: Payer: Self-pay | Admitting: Gastroenterology

## 2020-02-07 DIAGNOSIS — K449 Diaphragmatic hernia without obstruction or gangrene: Secondary | ICD-10-CM

## 2020-02-07 DIAGNOSIS — K219 Gastro-esophageal reflux disease without esophagitis: Secondary | ICD-10-CM | POA: Insufficient documentation

## 2020-02-07 DIAGNOSIS — Z803 Family history of malignant neoplasm of breast: Secondary | ICD-10-CM | POA: Diagnosis not present

## 2020-02-07 DIAGNOSIS — K3189 Other diseases of stomach and duodenum: Secondary | ICD-10-CM

## 2020-02-07 DIAGNOSIS — Z886 Allergy status to analgesic agent status: Secondary | ICD-10-CM | POA: Insufficient documentation

## 2020-02-07 DIAGNOSIS — K317 Polyp of stomach and duodenum: Secondary | ICD-10-CM | POA: Diagnosis not present

## 2020-02-07 DIAGNOSIS — Z882 Allergy status to sulfonamides status: Secondary | ICD-10-CM | POA: Insufficient documentation

## 2020-02-07 DIAGNOSIS — Z79899 Other long term (current) drug therapy: Secondary | ICD-10-CM | POA: Insufficient documentation

## 2020-02-07 DIAGNOSIS — L538 Other specified erythematous conditions: Secondary | ICD-10-CM | POA: Diagnosis not present

## 2020-02-07 DIAGNOSIS — Z85828 Personal history of other malignant neoplasm of skin: Secondary | ICD-10-CM | POA: Insufficient documentation

## 2020-02-07 HISTORY — PX: ESOPHAGOGASTRODUODENOSCOPY (EGD) WITH PROPOFOL: SHX5813

## 2020-02-07 SURGERY — ESOPHAGOGASTRODUODENOSCOPY (EGD) WITH PROPOFOL
Anesthesia: General

## 2020-02-07 MED ORDER — PROPOFOL 500 MG/50ML IV EMUL
INTRAVENOUS | Status: AC
  Filled 2020-02-07: qty 50

## 2020-02-07 MED ORDER — PROPOFOL 500 MG/50ML IV EMUL
INTRAVENOUS | Status: DC | PRN
  Administered 2020-02-07: 150 ug/kg/min via INTRAVENOUS

## 2020-02-07 MED ORDER — PROPOFOL 10 MG/ML IV BOLUS
INTRAVENOUS | Status: DC | PRN
  Administered 2020-02-07: 20 mg via INTRAVENOUS
  Administered 2020-02-07: 10 mg via INTRAVENOUS
  Administered 2020-02-07: 20 mg via INTRAVENOUS
  Administered 2020-02-07: 50 mg via INTRAVENOUS

## 2020-02-07 MED ORDER — SODIUM CHLORIDE 0.9 % IV SOLN: INTRAVENOUS | Status: DC

## 2020-02-07 MED ORDER — LIDOCAINE HCL (CARDIAC) PF 100 MG/5ML IV SOSY
PREFILLED_SYRINGE | INTRAVENOUS | Status: DC | PRN
  Administered 2020-02-07: 80 mg via INTRAVENOUS

## 2020-02-07 NOTE — Anesthesia Procedure Notes (Signed)
Date/Time: 02/07/2020 10:15 AM Performed by: Allean Found, CRNA Pre-anesthesia Checklist: Patient identified, Suction available, Emergency Drugs available, Patient being monitored and Timeout performed Oxygen Delivery Method: Nasal cannula Placement Confirmation: positive ETCO2

## 2020-02-07 NOTE — Anesthesia Preprocedure Evaluation (Addendum)
Anesthesia Evaluation  Patient identified by MRN, date of birth, ID band Patient awake    Reviewed: Allergy & Precautions, NPO status , Patient's Chart, lab work & pertinent test results  History of Anesthesia Complications (+) PONV and history of anesthetic complications  Airway Mallampati: III  TM Distance: >3 FB Neck ROM: Full    Dental no notable dental hx.    Pulmonary neg pulmonary ROS, neg sleep apnea, neg COPD,    breath sounds clear to auscultation- rhonchi (-) wheezing      Cardiovascular Exercise Tolerance: Good (-) hypertension(-) CAD, (-) Past MI, (-) Cardiac Stents and (-) CABG  Rhythm:Regular Rate:Normal - Systolic murmurs and - Diastolic murmurs    Neuro/Psych  Headaches, neg Seizures PSYCHIATRIC DISORDERS Anxiety Depression    GI/Hepatic Neg liver ROS, hiatal hernia, PUD, GERD  ,  Endo/Other  negative endocrine ROSneg diabetes  Renal/GU negative Renal ROS     Musculoskeletal  (+) Arthritis ,   Abdominal (+) - obese,   Peds  Hematology  (+) anemia ,   Anesthesia Other Findings Past Medical History: No date: Actinic keratosis No date: Anemia     Comment:  LAST HGB 15.2 ON 04-22-16 No date: Anxiety No date: Arthritis 2016: Cancer [facility]     Comment:  skin 2013: Cataract (lens) fragments in eye following cataract surgery,  bilateral     Comment:  one eye done then 4 weeks later the other eye done. 10/29/2015: Chronic ulcerative colitis, without complications (HCC) No date: Diverticulitis 10/04/2014: Esophagitis, reflux No date: Family history of adverse reaction to anesthesia     Comment:  SISTER HARD TO WAKE UP and nausea and vomiting. 08/09/2013: Female stress incontinence 08/06/2016: Gastroesophageal reflux disease without esophagitis No date: GERD (gastroesophageal reflux disease) No date: Headache     Comment:  MIGRAINE No date: History of hiatal hernia 10/29/2013: Incomplete emptying of  bladder 07/26/2013: Migraine No date: PONV (postoperative nausea and vomiting)     Comment:  nausea, does not remember vomiting. No date: Postherpetic neuralgia     Comment:  RIGHT 7TH.CRANIAL NERVE from shingles followed by Bell's              palsy 11/09/2016: Postmenopausal No date: PVC (premature ventricular contraction) No date: Scoliosis 07/26/2013: Scoliosis (and kyphoscoliosis), idiopathic 01/18/2015: Scoliosis of lumbosacral spine 03/13/2013: Squamous cell carcinoma of skin     Comment:  L clavicle  03/10/2018: Squamous cell carcinoma of skin     Comment:  L med knee 02/21/2019: Squamous cell carcinoma of skin     Comment:  R cheek  06/01/2019: Squamous cell carcinoma of skin     Comment:  L mid lat pretibial - ED&C 11/29/2014: Trochanteric bursitis No date: Ulcerative colitis ([place]) 08/09/2013: Urethral prolapse 08/09/2013: Urge incontinence   Reproductive/Obstetrics                             Anesthesia Physical Anesthesia Plan  ASA: II  Anesthesia Plan: General   Post-op Pain Management:    Induction: Intravenous  PONV Risk Score and Plan: 3 and Propofol infusion  Airway Management Planned: Natural Airway  Additional Equipment:   Intra-op Plan:   Post-operative Plan:   Informed Consent: I have reviewed the patients History and Physical, chart, labs and discussed the procedure including the risks, benefits and alternatives for the proposed anesthesia with the patient or authorized representative who has indicated his/her understanding and acceptance.     Dental advisory  given  Plan Discussed with: CRNA and Anesthesiologist  Anesthesia Plan Comments:         Anesthesia Quick Evaluation

## 2020-02-07 NOTE — Transfer of Care (Signed)
Immediate Anesthesia Transfer of Care Note  Patient: April Larsen  Procedure(s) Performed: ESOPHAGOGASTRODUODENOSCOPY (EGD) WITH PROPOFOL (N/A )  Patient Location: PACU  Anesthesia Type:General  Level of Consciousness: awake  Airway & Oxygen Therapy: Patient Spontanous Breathing and Patient connected to nasal cannula oxygen  Post-op Assessment: Report given to RN and Post -op Vital signs reviewed and stable  Post vital signs: Reviewed and stable  Last Vitals:  Vitals Value Taken Time  BP 116/73 02/07/20 1000  Temp 36.6 C 02/07/20 0959  Pulse 83 02/07/20 1002  Resp 11 02/07/20 1002  SpO2 99 % 02/07/20 1002  Vitals shown include unvalidated device data.  Last Pain:  Vitals:   02/07/20 0959  TempSrc: Temporal  PainSc: 0-No pain         Complications: No complications documented.

## 2020-02-07 NOTE — H&P (Signed)
[name], MD [address], [place], [place], [place], [address] [address], [place], [place], [place], [address] Phone: [phone]  Fax: [phone]  Primary Care Physician:  Rusty Aus, MD   Pre-Procedure History & Physical: HPI:  April Larsen is a 74 y.o. female is here for an EGD.   Past Medical History:  Diagnosis Date  . Actinic keratosis   . Anemia    LAST HGB 15.2 ON 04-22-16  . Anxiety   . Arthritis   . Cancer ([place]) 2016   skin  . Cataract (lens) fragments in eye following cataract surgery, bilateral 2013   one eye done then 4 weeks later the other eye done.  . Chronic ulcerative colitis, without complications ([place]) 08/30/2631  . Diverticulitis   . Esophagitis, reflux 10/04/2014  . Family history of adverse reaction to anesthesia    SISTER HARD TO WAKE UP and nausea and vomiting.  . Female stress incontinence 08/09/2013  . Gastroesophageal reflux disease without esophagitis 08/06/2016  . GERD (gastroesophageal reflux disease)   . Headache    MIGRAINE  . History of hiatal hernia   . Incomplete emptying of bladder 10/29/2013  . Migraine 07/26/2013  . PONV (postoperative nausea and vomiting)    nausea, does not remember vomiting.  Marland Kitchen Postherpetic neuralgia    RIGHT 7TH.CRANIAL NERVE from shingles followed by Bell's palsy  . Postmenopausal 11/09/2016  . PVC (premature ventricular contraction)   . Scoliosis   . Scoliosis (and kyphoscoliosis), idiopathic 07/26/2013  . Scoliosis of lumbosacral spine 01/18/2015  . Squamous cell carcinoma of skin 03/13/2013   L clavicle   . Squamous cell carcinoma of skin 03/10/2018   L med knee  . Squamous cell carcinoma of skin 02/21/2019   R cheek   . Squamous cell carcinoma of skin 06/01/2019   L mid lat pretibial - ED&C  . Trochanteric bursitis 11/29/2014  . Ulcerative colitis ([place])   . Urethral prolapse 08/09/2013  . Urge incontinence 08/09/2013    Past Surgical History:  Procedure Laterality Date  .  ABDOMINAL HYSTERECTOMY    . BACK SURGERY     L3-L4 AND L3-S1 DECOMPRESSION  . CARPAL TUNNEL RELEASE    . CHOLECYSTECTOMY N/A 08/21/2016   Procedure: LAPAROSCOPIC CHOLECYSTECTOMY WITH INTRAOPERATIVE CHOLANGIOGRAM;  Surgeon: Leonie Green, MD;  Location: [facility] ORS;  Service: General;  Laterality: N/A;  . COLONOSCOPY  11/06/2005  . COLONOSCOPY WITH PROPOFOL N/A 07/29/2016   Procedure: COLONOSCOPY WITH PROPOFOL;  Surgeon: Manya Silvas, MD;  Location: [facility] ENDOSCOPY;  Service: Endoscopy;  Laterality: N/A;  . ESOPHAGOGASTRODUODENOSCOPY  11/06/2005  . ESOPHAGOGASTRODUODENOSCOPY (EGD) WITH PROPOFOL N/A 07/29/2016   Procedure: ESOPHAGOGASTRODUODENOSCOPY (EGD) WITH PROPOFOL;  Surgeon: Manya Silvas, MD;  Location[facility] END[facility]COPY;  Service: Endoscopy;  Laterality: N/A;  . LAPAROSCOPIC LOW ANTERIOR RESECTION  11/24/2016   Procedure: LAPAROSCOPIC LOW ANTERIOR RESECTION;  Surgeon: Jules Husbands, MD;  Location: [facility] ORS;  Service: General;;    Prior to Admission medications   Medication Sig Start Date End Date Taking? Authorizing Provider  acetaminophen (TYLENOL) 500 MG tablet Take 1,000 mg by mouth every 6 (six) hours as needed for moderate pain or headache.     [provider]  buPROPion (WELLBUTRIN XL) 150 MG 24 hr tablet Take 150 mg by mouth daily. 12/08/18 02/02/20  [provider]  Calcium Carbonate-Vitamin D (CALCIUM 600+D PO) Take 1 tablet by mouth 2 (two) times daily.    [provider]  cholecalciferol (VITAMIN D) 1000 units  tablet Take 2,000 Units by mouth daily.    [provider]  DULoxetine (CYMBALTA) 60 MG capsule Take 60 mg by mouth at bedtime.  06/17/15   [provider]  omeprazole (PRILOSEC) 20 MG capsule Take 20 mg by mouth 2 (two) times daily. 07/29/15   [provider]  traMADol (ULTRAM) 50 MG tablet Take 50 mg by mouth 3 (three) times daily. 11/27/18   [provider]    Allergies as of 02/01/2020 - Review Complete  02/01/2020  Allergen Reaction Noted  . Nsaids Other (See Comments) 01/05/2020  . Sulfa antibiotics Rash 08/24/2015    Family History  Problem Relation Age of Onset  . Breast cancer Sister 36  . Breast cancer Paternal Aunt 59  . Breast cancer Cousin        1st paternal cousin    Social History   Socioeconomic History  . Marital status: Divorced    Spouse name: Not on file  . Number of children: Not on file  . Years of education: Not on file  . Highest education level: Not on file  Occupational History  . Not on file  Tobacco Use  . Smoking status: Never Smoker  . Smokeless tobacco: Never Used  Vaping Use  . Vaping Use: Never used  Substance and Sexual Activity  . Alcohol use: No  . Drug use: No  . Sexual activity: Not on file  Other Topics Concern  . Not on file  Social History Narrative  . Not on file   Social Determinants of Health   Financial Resource Strain:   . Difficulty of Paying Living Expenses: Not on file  Food Insecurity:   . Worried About Charity fundraiser in the Last Year: Not on file  . Ran Out of Food in the Last Year: Not on file  Transportation Needs:   . Lack of Transportation (Medical): Not on file  . Lack of Transportation (Non-Medical): Not on file  Physical Activity:   . Days of Exercise per Week: Not on file  . Minutes of Exercise per Session: Not on file  Stress:   . Feeling of Stress : Not on file  Social Connections:   . Frequency of Communication with Friends and Family: Not on file  . Frequency of Social Gatherings with Friends and Family: Not on file  . Attends Religious Services: Not on file  . Active Member of Clubs or Organizations: Not on file  . Attends Archivist Meetings: Not on file  . Marital Status: Not on file  Intimate Partner Violence:   . Fear of Current or Ex-Partner: Not on file  . Emotionally Abused: Not on file  . Physically Abused: Not on file  . Sexually Abused: Not on file    Review of  Systems: See HPI, otherwise negative ROS  Physical Exam: There were no vitals taken for this visit. General:   Alert,  pleasant and cooperative in NAD Head:  Normocephalic and atraumatic. Neck:  Supple; no masses or thyromegaly. Lungs:  Clear throughout to auscultation, normal respiratory effort.    Heart:  +S1, +S2, Regular rate and rhythm, No edema. Abdomen:  Soft, nontender and nondistended. Normal bowel sounds, without guarding, and without rebound.   Neurologic:  Alert and  oriented x4;  grossly normal neurologically.  Impression/Plan: Katoria Yetman is here for an EGD for Acid Reflux and large hiatal hernia.  Risks, benefits, limitations, and alternatives regarding the procedure have been reviewed with the patient.  Questions have been answered.  All parties agreeable.   Virgel Manifold, MD  02/07/2020, 8:47 AM

## 2020-02-07 NOTE — Anesthesia Postprocedure Evaluation (Signed)
Anesthesia Post Note  Patient: April Larsen  Procedure(s) Performed: ESOPHAGOGASTRODUODENOSCOPY (EGD) WITH PROPOFOL (N/A )  Patient location during evaluation: Endoscopy Anesthesia Type: General Level of consciousness: awake and alert and oriented Pain management: pain level controlled Vital Signs Assessment: post-procedure vital signs reviewed and stable Respiratory status: spontaneous breathing, nonlabored ventilation and respiratory function stable Cardiovascular status: blood pressure returned to baseline and stable Postop Assessment: no signs of nausea or vomiting Anesthetic complications: no   No complications documented.   Last Vitals:  Vitals:   02/07/20 1009 02/07/20 1019  BP: 133/76 129/83  Pulse:    Resp:    Temp:    SpO2:      Last Pain:  Vitals:   02/07/20 1019  TempSrc:   PainSc: 0-No pain                 Elyanah Farino

## 2020-02-07 NOTE — Op Note (Signed)
[facility] Gastroenterology Patient Name: April Larsen Procedure Date: 02/07/2020 9:34 AM MRN: [id number] Account #: [id number] Date of Birth: 26-Jul-1945 Admit Type: Outpatient Age: 74 Room: [facility] ENDO ROOM 4 Gender: Female Note Status: Finalized Procedure:             Upper GI endoscopy Indications:           Heartburn, Follow-up of hiatal hernia Providers:             Conner Neiss B. Bonna Gains MD, MD Referring MD:          Rusty Aus, MD (Referring MD) Medicines:             Monitored Anesthesia Care Complications:         No immediate complications. Procedure:             Pre-Anesthesia Assessment:                        - Prior to the procedure, a History and Physical was                         performed, and patient medications, allergies and                         sensitivities were reviewed. The patient's tolerance                         of previous anesthesia was reviewed.                        - The risks and benefits of the procedure and the                         sedation options and risks were discussed with the                         patient. All questions were answered and informed                         consent was obtained.                        - Patient identification and proposed procedure were                         verified prior to the procedure by the physician, the                         nurse, the anesthesiologist, the anesthetist and the                         technician. The procedure was verified in the                         procedure room.                        - ASA Grade Assessment: II - A patient with mild  systemic disease.                        After obtaining informed consent, the endoscope was                         passed under direct vision. Throughout the procedure,                         the patient's blood pressure, pulse, and oxygen                         saturations were monitored  continuously. The Endoscope                         was introduced through the mouth, and advanced to the                         second part of duodenum. The upper GI endoscopy was                         accomplished with ease. The patient tolerated the                         procedure well. Findings:      The examined esophagus was normal.      A 5 cm hiatal hernia was present.      Patchy mildly erythematous mucosa without bleeding was found in the       gastric antrum. Biopsies were taken with a cold forceps for histology.       Biopsies were obtained in the gastric body, at the incisura and in the       gastric antrum with cold forceps for histology.      A single 4 mm sessile polyp with no bleeding and no stigmata of recent       bleeding was found in the gastric body. Biopsies were taken with a cold       forceps for histology.      The exam of the stomach was otherwise normal.      The duodenal bulb, second portion of the duodenum and examined duodenum       were normal. Impression:            - Normal esophagus.                        - 5 cm hiatal hernia.                        - Erythematous mucosa in the antrum. Biopsied.                        - A single gastric polyp. Biopsied.                        - Normal duodenal bulb, second portion of the duodenum                         and examined duodenum.                        -  Biopsies were obtained in the gastric body, at the                         incisura and in the gastric antrum. Recommendation:        - Await pathology results.                        - Discharge patient to home (with escort).                        - Advance diet as tolerated.                        - Continue present medications.                        - Patient has a contact number available for                         emergencies. The signs and symptoms of potential                         delayed complications were discussed with the patient.                          Return to normal activities tomorrow. Written                         discharge instructions were provided to the patient.                        - Discharge patient to home (with escort).                        - The findings and recommendations were discussed with                         the patient.                        - The findings and recommendations were discussed with                         the patient's family. Procedure Code(s):     --- Professional ---                        [phone], Esophagogastroduodenoscopy, flexible,                         transoral; with biopsy, single or multiple Diagnosis Code(s):     --- Professional ---                        K44.9, Diaphragmatic hernia without obstruction or                         gangrene                        K31.89, Other diseases of stomach and duodenum  K31.7, Polyp of stomach and duodenum                        R12, Heartburn CPT copyright 2019 American Medical Association. All rights reserved. The codes documented in this report are preliminary and upon coder review may  be revised to meet current compliance requirements.  [name], MD Margretta Sidle B. Bonna Gains MD, MD 02/07/2020 9:58:48 AM This report has been signed electronically. Number of Addenda: 0 Note Initiated On: 02/07/2020 9:34 AM Estimated Blood Loss:  Estimated blood loss: none.      [facility]

## 2020-02-08 ENCOUNTER — Ambulatory Visit: Payer: PPO | Admitting: Physical Therapy

## 2020-02-08 ENCOUNTER — Encounter: Payer: Self-pay | Admitting: Gastroenterology

## 2020-02-08 LAB — SURGICAL PATHOLOGY

## 2020-02-09 ENCOUNTER — Ambulatory Visit
Admission: RE | Admit: 2020-02-09 | Discharge: 2020-02-09 | Disposition: A | Discharge status: Home / Self Care | Payer: PPO | Source: Ambulatory Visit | Attending: Surgery | Admitting: Surgery

## 2020-02-09 ENCOUNTER — Other Ambulatory Visit: Payer: Self-pay

## 2020-02-09 DIAGNOSIS — K429 Umbilical hernia without obstruction or gangrene: Secondary | ICD-10-CM | POA: Diagnosis not present

## 2020-02-09 DIAGNOSIS — R1013 Epigastric pain: Secondary | ICD-10-CM | POA: Diagnosis not present

## 2020-02-09 DIAGNOSIS — I700 Atherosclerosis of aorta: Secondary | ICD-10-CM | POA: Diagnosis not present

## 2020-02-09 DIAGNOSIS — K5730 Diverticulosis of large intestine without perforation or abscess without bleeding: Secondary | ICD-10-CM | POA: Diagnosis not present

## 2020-02-09 DIAGNOSIS — K449 Diaphragmatic hernia without obstruction or gangrene: Secondary | ICD-10-CM | POA: Diagnosis not present

## 2020-02-09 LAB — POCT I-STAT CREATININE: Creatinine, Ser: 1 mg/dL (ref 0.44–1.00)

## 2020-02-09 MED ORDER — IOHEXOL 300 MG/ML  SOLN
100.0000 mL | Freq: Once | INTRAMUSCULAR | Status: AC | PRN
  Administered 2020-02-09: 100 mL via INTRAVENOUS

## 2020-02-12 ENCOUNTER — Telehealth: Payer: Self-pay

## 2020-02-12 NOTE — Telephone Encounter (Signed)
-----   Message from Jules Husbands, MD sent at 02/12/2020  9:16 AM EST ----- Please let her know she does have a large hiatal hernia, f/u w me ----- Message ----- From: Interface, Rad Results In Sent: 02/09/2020  10:10 PM EST To: Jules Husbands, MD

## 2020-02-12 NOTE — Telephone Encounter (Signed)
Spoke with patient and notified her of recent CT results per Dr.Pabon and to advised her to keep scheduled follow up appointment for 02/26/20 at 11:15a. Patient did have questions about the hiatal hernia and recovery, I encouraged patient she is in good hands and Dr.Pabon see patients for this all the time. Patient verbalized understanding and has no further questions.

## 2020-02-13 ENCOUNTER — Encounter: Payer: Self-pay | Admitting: Physical Therapy

## 2020-02-13 ENCOUNTER — Other Ambulatory Visit: Payer: Self-pay

## 2020-02-13 ENCOUNTER — Ambulatory Visit: Payer: PPO | Admitting: Physical Therapy

## 2020-02-13 DIAGNOSIS — M6281 Muscle weakness (generalized): Secondary | ICD-10-CM | POA: Diagnosis not present

## 2020-02-13 DIAGNOSIS — R2689 Other abnormalities of gait and mobility: Secondary | ICD-10-CM | POA: Diagnosis not present

## 2020-02-13 DIAGNOSIS — R262 Difficulty in walking, not elsewhere classified: Secondary | ICD-10-CM

## 2020-02-13 NOTE — Therapy (Signed)
[place] MAIN [facility] SERVICES [address] [place], [place], [address] Phone: [phone]   Fax:  [phone]  Physical Therapy Treatment  Patient Details  Name: April Larsen MRN: [id number] Date of Birth: 72-Jul-1947 Referring Provider (PT): Emily Filbert   Encounter Date: 02/13/2020   PT End of Session - 02/13/20 1021    Visit Number 6    Number of Visits 17    Date for PT Re-Evaluation 02/27/20    PT Start Time 0962    PT Stop Time 1055    PT Time Calculation (min) 40 min    Equipment Utilized During Treatment Gait belt    Behavior During Therapy [facility] for tasks assessed/performed           Past Medical History:  Diagnosis Date  . Actinic keratosis   . Anemia    LAST HGB 15.2 ON 04-22-16  . Anxiety   . Arthritis   . Cancer ([place]) 2016   skin  . Cataract (lens) fragments in eye following cataract surgery, bilateral 2013   one eye done then 4 weeks later the other eye done.  . Chronic ulcerative colitis, without complications ([place]) 10/31/6627  . Diverticulitis   . Esophagitis, reflux 10/04/2014  . Family history of adverse reaction to anesthesia    SISTER HARD TO WAKE UP and nausea and vomiting.  . Female stress incontinence 08/09/2013  . Gastroesophageal reflux disease without esophagitis 08/06/2016  . GERD (gastroesophageal reflux disease)   . Headache    MIGRAINE  . History of hiatal hernia   . Incomplete emptying of bladder 10/29/2013  . Migraine 07/26/2013  . PONV (postoperative nausea and vomiting)    nausea, does not remember vomiting.  Marland Kitchen Postherpetic neuralgia    RIGHT 7TH.CRANIAL NERVE from shingles followed by Bell's palsy  . Postmenopausal 11/09/2016  . PVC (premature ventricular contraction)   . Scoliosis   . Scoliosis (and kyphoscoliosis), idiopathic 07/26/2013  . Scoliosis of lumbosacral spine 01/18/2015  . Squamous cell carcinoma of skin 03/13/2013   L clavicle   . Squamous cell carcinoma of skin 03/10/2018    L med knee  . Squamous cell carcinoma of skin 02/21/2019   R cheek   . Squamous cell carcinoma of skin 06/01/2019   L mid lat pretibial - ED&C  . Trochanteric bursitis 11/29/2014  . Ulcerative colitis ([place])   . Urethral prolapse 08/09/2013  . Urge incontinence 08/09/2013    Past Surgical History:  Procedure Laterality Date  . ABDOMINAL HYSTERECTOMY    . BACK SURGERY     L3-L4 AND L3-S1 DECOMPRESSION  . CARPAL TUNNEL RELEASE    . CHOLECYSTECTOMY N/A 08/21/2016   Procedure: LAPAROSCOPIC CHOLECYSTECTOMY WITH INTRAOPERATIVE CHOLANGIOGRAM;  Surgeon: Leonie Green, MD;  Location: [facility] ORS;  Service: General;  Laterality: N/A;  . COLONOSCOPY  11/06/2005  . COLONOSCOPY WITH PROPOFOL N/A 07/29/2016   Procedure: COLONOSCOPY WITH PROPOFOL;  Surgeon: Manya Silvas, MD;  Location[facility] ENDOSCOPY;  Service: Endoscopy;  Laterality: N/A;  . ESOPHAGOGASTRODUODENOSCOPY  11/06/2005  . ESOPHAGOGASTRODUODENOSCOPY (EGD) WITH PROPOFOL N/A 07/29/2016   Procedure: ESOPHAGOGASTRODUODENOSCOPY (EGD) WITH PROPOFOL;  Surgeon: Manya Silvas, MD;  Location: [facility] ENDOSCOPY;  Service: Endoscopy;  Laterality: N/A;  . ESOPHAGOGASTRODUODENOSCOPY (EGD) WITH PROPOFOL N/A 02/07/2020   Procedure: ESOPHAGOGASTRODUODENOSCOPY (EGD) WITH PROPOFOL;  Surgeon: Virgel Manifold, MD;  Location: [facility] ENDOSCOPY;  Service: Endoscopy;  Laterality: N/A;  . LAPAROSCOPIC LOW ANTERIOR RESECTION  11/24/2016   Procedure: LAPAROSCOPIC LOW ANTERIOR RESECTION;  Surgeon:  Jules Husbands, MD;  Location: [facility] ORS;  Service: General;;    There were no vitals filed for this visit.   Subjective Assessment - 02/13/20 1020    Subjective Patient is going to have a surgery for Hiatal hernia . It is not scheduled yet.    Pertinent History Per MD note:Patient presents for intermittent ear pain and sore throat over the last week. She reports she has pain in her left ear that radiates to the left side of her throat. She is able to eat and drink  without issues. She has had nausea this morning. She denies hearing loss, dizziness, and ear drainage. She has not been around any other sick contacts. She has completed Pfizer vaccine, last dose was in March. She had Covid in October of last year and was hospitalized for 1 week. She denies fever, chills, sinus pressure, cough, congestion, chest pain, vomiting, diarrhea, GU symptoms, dizziness, weakness, syncope.    Patient Stated Goals to be able to stop falling and stumbling    Currently in Pain? No/denies    Pain Score 0-No pain    Pain Onset More than a month ago           Treatment: Oct[employer] fitness x 5 L4  Ther-ex  Nu-step  x 5 mins  Hip flexion marches with 2# ankle weights x 10 bilateral; Hip abduction with 2# x 10 bilateral Hip extension with 2# x 10 bilateral Quantum leg press 40# x 20 x 3 sets  Lunges to BOSU ball x 15 BLE Step ups to 6-inch stool x 20  Eccentric step downs from 3 inch stool x 10 verbal cues to complete slow and tap heel.  BUE CGA  Neuromuscular Re-education   1/2 foam roll balance with flat side up 30s x 2 reps 1/2 foam roll balance with flat side down 30s x 2 reps Lateral side steps from foam to 6 inch stool left and right x 15    Pt educated throughout session about proper posture and technique with exercises. Improved exercise technique, movement at target joints, use of target muscles after min to mod verbal, visual, tactile cues. CGA and Min to mod verbal cues used throughout with increased in postural sway and LOB most seen with narrow base of support and while on uneven surfaces.increase motor firing of desired muscle to encourage fatigue.                          PT Education - 02/13/20 1021    Education Details HEP    Person(s) Educated Patient    Methods Explanation    Comprehension Verbalized understanding;Need further instruction            PT Short Term Goals - 01/02/20 1339      PT SHORT TERM GOAL #1   Title  Patient will be independent in home exercise program to improve strength/mobility for better functional independence with ADLs.    Time 4    Period Weeks    Status New    Target Date 01/30/20      PT SHORT TERM GOAL #2   Title Patient (> 46 years old) will complete five times sit to stand test in < 15 seconds indicating an increased LE strength and improved balance.    Time 4    Period Weeks    Status New    Target Date 01/30/20  PT Long Term Goals - 01/02/20 1340      PT LONG TERM GOAL #1   Title Patient will increase Berg Balance score by > 6 points to demonstrate decreased fall risk during functional activities.    Time 8    Period Weeks    Status New    Target Date 02/27/20      PT LONG TERM GOAL #2   Title Patient will increase 10 meter walk test to >1.62ms as to improve gait speed for better community ambulation and to reduce fall risk.    Time 8    Period Weeks    Status New    Target Date 02/27/20      PT LONG TERM GOAL #3   Title Patient will reduce timed up and go to <11 seconds to reduce fall risk and demonstrate improved transfer/gait ability.    Time 8    Period Weeks    Status New    Target Date 02/27/20                 Plan - 02/13/20 1022    Clinical Impression Statement Patient demonstrating increased coordination of muscle activation with smoother movements during strengthening interventions in close chained. Decreased fatigue at end of set implicates increased strength additionally. Patient continues to be challenged by dynamic surfaces and single limb stance. Patient will continue to benefit from skilled physical therapy to improve gait mechanics, strength, and balance for return to PLOF   Personal Factors and Comorbidities Age    Stability/Clinical Decision Making Stable/Uncomplicated    Rehab Potential Fair    PT Frequency 2x / week    PT Duration 8 weeks    PT Treatment/Interventions Functional mobility training;Balance  training;Neuromuscular re-education;Therapeutic exercise;Therapeutic activities;Patient/family education    PT Next Visit Plan Conitnue with balance training    PT Home Exercise Plan No updates, but encouraged compliance    Consulted and Agree with Plan of Care Patient           Patient will benefit from skilled therapeutic intervention in order to improve the following deficits and impairments:  Abnormal gait, Pain, Decreased knowledge of use of DME, Decreased mobility, Decreased activity tolerance, Decreased endurance, Decreased range of motion, Decreased strength  Visit Diagnosis: Difficulty in walking, not elsewhere classified  Other abnormalities of gait and mobility  Muscle weakness (generalized)     Problem List Patient Active Problem List   Diagnosis Date Noted  . Hiatal hernia   . Stomach irritation   . Primary osteoarthritis of right knee 01/05/2020  . Generalized osteoarthritis 04/13/2019  . Osteopenia of multiple sites 04/13/2019  . History of 2019 novel coronavirus disease (COVID-19) 01/19/2019  . Pneumonia due to COVID-19 virus 01/09/2019  . Acute respiratory failure with hypoxia ([place] 01/09/2019  . Major depressive disorder, recurrent, mild ([place] 12/08/2018  . Hyperlipidemia, mixed 11/19/2017  . Diverticulitis of large intestine without perforation or abscess without bleeding 05/18/2017  . Medicare annual wellness visit, initial 11/11/2016  . Postmenopausal 11/09/2016  . Diverticulitis 08/06/2016  . Gastroesophageal reflux disease without esophagitis 08/06/2016  . Chronic ulcerative colitis, without complications ([place] 032/44/0102 . Scoliosis of lumbosacral spine 01/18/2015  . Trochanteric bursitis 11/29/2014  . Esophagitis, reflux 10/04/2014  . Incomplete emptying of bladder 10/29/2013  . Female stress incontinence 08/09/2013  . Urethral prolapse 08/09/2013  . Urge incontinence 08/09/2013  . Migraine 07/26/2013  . Scoliosis (and kyphoscoliosis),  idiopathic 07/26/2013  . Ulcerative colitis ([place] 07/26/2013    Gwyn Hieronymus, [place]  S, PT DPT 02/13/2020, 10:26 AM  [place] [facility] SERVICES [address] [place], [place], [address] Phone: [phone]   Fax:  [phone]  Name: Lily Velasquez MRN: [id number] Date of Birth: [birth year]

## 2020-02-15 ENCOUNTER — Ambulatory Visit: Payer: PPO | Admitting: Physical Therapy

## 2020-02-16 ENCOUNTER — Other Ambulatory Visit: Payer: Self-pay

## 2020-02-16 ENCOUNTER — Ambulatory Visit
Admission: RE | Admit: 2020-02-16 | Discharge: 2020-02-16 | Disposition: A | Discharge status: Home / Self Care | Payer: PPO | Source: Ambulatory Visit | Attending: Surgery | Admitting: Surgery

## 2020-02-16 DIAGNOSIS — R300 Dysuria: Secondary | ICD-10-CM | POA: Diagnosis not present

## 2020-02-16 DIAGNOSIS — R1013 Epigastric pain: Secondary | ICD-10-CM | POA: Diagnosis not present

## 2020-02-16 DIAGNOSIS — K2289 Other specified disease of esophagus: Secondary | ICD-10-CM | POA: Diagnosis not present

## 2020-02-20 ENCOUNTER — Encounter: Payer: PPO | Admitting: Physical Therapy

## 2020-02-26 ENCOUNTER — Encounter: Payer: Self-pay | Admitting: Surgery

## 2020-02-26 ENCOUNTER — Ambulatory Visit (INDEPENDENT_AMBULATORY_CARE_PROVIDER_SITE_OTHER): Payer: PPO | Admitting: Surgery

## 2020-02-26 ENCOUNTER — Other Ambulatory Visit: Payer: Self-pay

## 2020-02-26 ENCOUNTER — Telehealth: Payer: Self-pay | Admitting: Surgery

## 2020-02-26 ENCOUNTER — Other Ambulatory Visit
Admission: RE | Admit: 2020-02-26 | Discharge: 2020-02-26 | Disposition: A | Discharge status: Home / Self Care | Payer: PPO | Source: Ambulatory Visit | Attending: Surgery | Admitting: Surgery

## 2020-02-26 VITALS — BP 132/85 | HR 99 | Temp 97.8°F | Ht 66.0 in | Wt 182.4 lb

## 2020-02-26 DIAGNOSIS — K3189 Other diseases of stomach and duodenum: Secondary | ICD-10-CM | POA: Diagnosis present

## 2020-02-26 DIAGNOSIS — Z85828 Personal history of other malignant neoplasm of skin: Secondary | ICD-10-CM | POA: Diagnosis not present

## 2020-02-26 DIAGNOSIS — K449 Diaphragmatic hernia without obstruction or gangrene: Secondary | ICD-10-CM

## 2020-02-26 DIAGNOSIS — Z01812 Encounter for preprocedural laboratory examination: Secondary | ICD-10-CM | POA: Insufficient documentation

## 2020-02-26 DIAGNOSIS — Z20822 Contact with and (suspected) exposure to covid-19: Secondary | ICD-10-CM | POA: Insufficient documentation

## 2020-02-26 DIAGNOSIS — E782 Mixed hyperlipidemia: Secondary | ICD-10-CM | POA: Diagnosis not present

## 2020-02-26 DIAGNOSIS — Z803 Family history of malignant neoplasm of breast: Secondary | ICD-10-CM | POA: Diagnosis not present

## 2020-02-26 DIAGNOSIS — K219 Gastro-esophageal reflux disease without esophagitis: Secondary | ICD-10-CM | POA: Diagnosis present

## 2020-02-26 DIAGNOSIS — D72828 Other elevated white blood cell count: Secondary | ICD-10-CM | POA: Diagnosis not present

## 2020-02-26 DIAGNOSIS — Z90710 Acquired absence of both cervix and uterus: Secondary | ICD-10-CM | POA: Diagnosis not present

## 2020-02-26 DIAGNOSIS — Z9049 Acquired absence of other specified parts of digestive tract: Secondary | ICD-10-CM | POA: Diagnosis not present

## 2020-02-26 DIAGNOSIS — M4120 Other idiopathic scoliosis, site unspecified: Secondary | ICD-10-CM | POA: Diagnosis present

## 2020-02-26 DIAGNOSIS — Z882 Allergy status to sulfonamides status: Secondary | ICD-10-CM | POA: Diagnosis not present

## 2020-02-26 DIAGNOSIS — Z888 Allergy status to other drugs, medicaments and biological substances status: Secondary | ICD-10-CM | POA: Diagnosis not present

## 2020-02-26 LAB — SARS CORONAVIRUS 2 (TAT 6-24 HRS): SARS Coronavirus 2: NEGATIVE

## 2020-02-26 MED ORDER — ACETAMINOPHEN 500 MG PO TABS: 1000.0000 mg | ORAL_TABLET | ORAL | Status: AC

## 2020-02-26 MED ORDER — GABAPENTIN 300 MG PO CAPS: 300.0000 mg | ORAL_CAPSULE | ORAL | Status: AC

## 2020-02-26 MED ORDER — HEPARIN SODIUM (PORCINE) 5000 UNIT/ML IJ SOLN: 5000.0000 [IU] | Freq: Once | INTRAMUSCULAR | Status: AC

## 2020-02-26 MED ORDER — CEFAZOLIN SODIUM-DEXTROSE 2-4 GM/100ML-% IV SOLN
2.0000 g | INTRAVENOUS | Status: AC
  Administered 2020-02-27: 2 g via INTRAVENOUS

## 2020-02-26 NOTE — Patient Instructions (Addendum)
We have spoken today about repairing your Hiatal Hernia. Your surgery will be scheduled at [facility] with Dr. Dahlia Byes.  You will need to have a Covid test today, go to the [facility] drive up testing site. We will let you know your arrival time for tomorrow.   Plan to be in the hospital for 2-3 days if the minimally invasive surgery is completed without having to make a bigger incision. If the bigger incision is made, you will most likely need to be in the hospital 7-10 days. You will be on a restrictive diet and need to recover for 2 weeks following your surgery prior to doing any of your normal activities. At the 2 week mark, we will see you in the office and if you are doing ok we will advance your diet and activity level as you tolerate.  Please see your Blue (Pre-care) Sheet for more information regarding your surgery.  Please call our office with any questions or concerns that you have regarding your surgery and recovery.     Laparoscopic Nissen Fundoplication Laparoscopic Nissen fundoplication is surgery to relieve heartburn and other problems caused by gastric fluids flowing up into your esophagus. The esophagus is the tube that carries food and liquid from your throat to your stomach. Normally, the muscle that sits between your stomach and your esophagus (lower esophageal sphincter or LES) keeps stomach fluids in your stomach. In some people, the LES does not work properly, and stomach fluids flow up into the esophagus. This can happen when part of the stomach bulges through the LES (hiatal hernia). The backward flow of stomach fluids can cause a type of severe and long-standing heartburn that is called gastroesophageal reflux disease (GERD). You may need this surgery if other treatments for GERD have not helped. In a laparoscopic Nissen fundoplication, the upper part of your stomach is wrapped around the lower part of your esophagus to strengthen the LES and prevent reflux. If you have a hiatal  hernia, it will also be repaired with this surgery. The procedure is done through several small incisions in your abdomen. It is performed using a thin, telescopic instrument (laparoscope) and other instruments that can pass through the scope or through other small incisions. Tell a health care provider about:  Any allergies you have.  All medicines you are taking, including vitamins, herbs, eye drops, creams, and over-the-counter medicines.  Any problems you or family members have had with anesthetic medicines.  Any blood disorders you have.  Any surgeries you have had.  Any medical conditions you have. What are the risks? Generally, this is a safe procedure. However, problems may occur, including:  Difficulty swallowing (dysphagia).  Bloating.  Nausea or vomiting.  Damage to the lung, causing a collapsed lung.  Infection or bleeding. What happens before the procedure?  Ask your health care provider about:  Changing or stopping your regular medicines. This is especially important if you are taking diabetes medicines or blood thinners.  Taking medicines such as aspirin and ibuprofen. These medicines can thin your blood. Do not take these medicines before your procedure if your health care provider asks you not to.  Follow your health care provider's instructions about eating or drinking restrictions.  Plan to have someone take you home after the procedure. What happens during the procedure?  An IV tube will be inserted into one of your veins. It will be used to give you fluids and medicines during the procedure.  You will be given a medicine  that makes you fall asleep (general anesthetic).  Your abdomen will be cleaned with a germ-killing solution (antiseptic).  The surgeon will make a small incision in your abdomen and insert a tube through the incision.  Your abdomen will be filled with a gas. This helps the surgeon to see your organs more easily and it makes more  space to work.  The surgeon will insert the laparoscope through the incision. The scope has a camera that will send pictures to a monitor in the operating room.  The surgeon will make several other small incisions in your abdomen to insert the other instruments that are needed during the procedure.  Another instrument (dilator) will be passed through your mouth and down your esophagus into the upper part of your stomach. The dilator will prevent your LES from being closed too tightly during surgery.  The surgeon will pass the top portion of your stomach behind the lower part of your esophagus and wrap it all the way around. This will be stitched into place.  If you have a hiatal hernia, it will be repaired during this procedure.  All instruments will be removed, and your incisions will be closed under your skin with stitches (sutures). Skin adhesive strips may also be used.  A bandage (dressing) will be placed on your skin over the incisions. The procedure may vary among health care providers and hospitals. What happens after the procedure?  You will be moved to a recovery area.  Your blood pressure, heart rate, breathing rate, and blood oxygen level will be monitored often until the medicines you were given have worn off.  You will be given pain medicine as needed.  Your IV tube will be kept in until you are able to drink fluids. This information is not intended to replace advice given to you by your health care provider. Make sure you discuss any questions you have with your health care provider. Document Released: 04/06/2014 Document Revised: 08/22/2015 Document Reviewed: 11/15/2013 Elsevier Interactive Patient Education  2017 [employer]   Laparoscopic Nissen Fundoplication, Care After Refer to this sheet in the next few weeks. These instructions provide you with information about caring for yourself after your procedure. Your health care provider may also give you more specific  instructions. Your treatment has been planned according to current medical practices, but problems sometimes occur. Call your health care provider if you have any problems or questions after your procedure. What can I expect after the procedure? After the procedure, it is common to have:  Difficulty swallowing (dysphagia).  Excess gas (bloating). Follow these instructions at home: Medicines   Take medicines only as directed by your health care provider.  Do not drive or operate heavy machinery while taking pain medicine. Incision care   There are many different ways to close and cover an incision, including stitches (sutures), skin glue, and adhesive strips. Follow your health care provider's instructions about:  Incision care.  Bandage (dressing) changes and removal.  Incision closure removal.  Check your incision areas every day for signs of infection. Watch for:  Redness, swelling, or pain.  Fluid, blood, or pus.  Do not take baths, swim, or use a hot tub until your health care provider approves. Take showers as directed by your health care provider. Eating and drinking   Follow your health care provider's instructions about eating.  You should return to your usual diet gradually.  Drink enough fluid to keep your urine clear or pale yellow. Activity  Return to your normal activities as directed by your health care provider. Ask your health care provider what activities are safe for you.  Avoid strenuous exercise.  Do not lift anything that is heavier than 10 lb (4.5 kg).  Ask your health care provider when you can:  Return to sexual activity.  Drive.  Go back to work. Contact a health care provider if:  You have a fever.  Your pain gets worse or is not helped by medicine.  You have frequent nausea or vomiting.  You have continued abdominal bloating.  You have an ongoing (persistent) cough.  You have redness, swelling, or pain in any incision  areas.  You have fluid, blood, or pus coming from any incisions. Get help right away if:  You have trouble breathing.  You are unable to swallow.  You have persistent vomiting.  You have blood in your vomit.  You have severe abdominal pain. This information is not intended to replace advice given to you by your health care provider. Make sure you discuss any questions you have with your health care provider. Document Released: 11/07/2003 Document Revised: 08/22/2015 Document Reviewed: 11/15/2013 Elsevier Interactive Patient Education  2017 [place].   Diet After Nissen Fundoplication Surgery This diet information is for patients who have recently had Nissen fundoplication surgery to correct reflux disease or to repair various types of hernias, such as hiatal hernia and intrathoracic stomach. This diet may also be used for other gastrointestinal surgeries, such as Heller myotomy and repair of achalasia. The diet will help control diarrhea, excess gas and swallowing problems, which may occur after this type of surgery. Keeping Your Stomach from Stretching Eat small, frequent meals (six to eight per day). This will help you consume the majority of the nutrients you need without causing your stomach to feel full or distended.  Drinking large amounts of fluids with meals can stretch your stomach. You may drink fluids between meals as often as you like, but limit fluids to 1/2 cup (4 fluid ounces) with meals and one cup (8 fluid ounces) with snacks.  Sit upright while eating and stay upright for 30 minutes after each meal. Gravity can help food move through your digestive tract. Do not lie down after eating. Sit upright for 2 hours after your last meal or snack of the day.  Eat very slowly. Take your time when eating.  Take small bites and chew your food well to help aid in swallowing and digestion.  Avoid crusty breads and sticky, gummy foods, such as bananas, fresh doughy breads, rolls  and doughnuts. These types of foods become sticky and difficult to swallow.  Toasted breads tend to be better tolerated.  Lastly, if you eat sweets, consume them at the end of your meal to avoid a group of symptoms referred to as "dumping syndrome". This describes the rapid emptying of foods from the stomach to the small intestine. Sweetened beverages, candy and desserts move more rapidly and dump quickly into the intestines. This can cause symptoms of nausea, weakness, cold sweats, cramps, diarrhea and dizzy spells.  Avoiding Gas Avoid drinking through a straw. Do not chew gum or tobacco. These actions cause you to swallow air, which produces excess gas in your stomach. Chew with your mouth closed.  Avoid any foods that cause stomach gas and distention. These foods include corn, dried beans, peas, lentils, onions, broccoli, cauliflower and any food from the cabbage family.  Avoid carbonated drinks, alcohol, citrus and tomato products.  When will I be able to eat a soft diet? After Nissen fundoplication surgery, your diet will be advanced slowly by your surgeon. Generally, you will be on a clear liquid diet for the first few meals. Then you will advance to the full liquid diet for a meal or two and eventually to a Nissen soft diet. Please be aware that each patient's tolerance to food is different. Your doctor will advance your diet depending on how well you progress after surgery. Clear Liquid Diet  The first diet after surgery is the clear liquid diet. It includes the following liquids: Apple juice  Cranberry juice  Grape juice  Chicken broth  Beef broth  Flavored gelatin (Jell-O)  Decaf tea and coffee  Caffeinated beverages are permitted based on tolerance  Popsicles  [place] ice Carbonated drinks (sodas) are not allowed for the first six to eight weeks after surgery. After this time you can try them again in small amounts.  Full Liquid Diet The full liquid diet contains anything on  the clear liquid diet, plus: Milk, soy, rice and almond (no chocolate)  Cream of wheat, cream of rice, grits  Strained creamed soups (no tomato or broccoli)  Vanilla and strawberry-flavored ice cream  Sherbet  Blended, custard styled or whipped yogurt (plain or vanilla only)  Vanilla and butterscotch pudding (no chocolate or coconut)  Nutritional drinks including Ensure, Boost, Carnation Instant Breakfast (no chocolate-flavored) Note: Dairy products, such as milk, ice cream and pudding, may cause diarrhea in some people just after surgery. You may need to avoid milk products. If so, substitute them with lactose-free beverages, such as soy, rice, Lactaid or almond milks.  Nissen Soft Diet Food Category Foods to Choose Foods to Avoid  Beverages Milk, such as, whole, 2%, 1%, non-fat, or skim, soy, rice, almond  Caffeinated and decaf tea and coffee  Powdered drink mixes (in moderation)  Non-citrus juices (apple, grape, cranberry or blends of these)  Fruit nectars  Nutritional drinks including Boost, Ensure, Carnation Instant Breakfast Chocolate milk, cocoa or other chocolate-flavored drinks  Carbonated drinks  Alcohol  Citrus juices like orange, grapefruit, lemon and lime  Breads Pancakes, [place] toast and waffles  Crackers (saltine, butter, soda, graham, Goldfish and Cheese Nips)  Toasted bread Untoasted bread, bagels, [place] and hard rolls, English muffins  Crackers with nuts, seeds, fresh or dried fruit, coconut, or highly seasoned, such as garlic or onion-flavored  Sweet rolls, coffee cake or doughnuts  Cereals Well cooked cereals, such as oatmeal (plain or flavored)  Cold cereal (Cornflakes, Rice Krispies, Cheerios, Special K plain, Rice Chex and puffed rice) Very coarse cereal, such as bran, shredded wheat  Any cereal with fresh or dried fruit, coconut, seeds or nuts  Desserts Eat in moderation and do not eat desserts or sweets by themselves. Plain cakes, cookies and  cream-filled pies  Vanilla and butterscotch pudding or custard  Ice cream, ice milk, frozen yogurt and sherbet  Gelatin made from allowed foods  Fruit ices and popsicles Desserts containing chocolate, coconut, nuts, seeds, fresh or dried fruit, peppermint or spearmint  Eggs  Poached, hard boiled or scrambled Fried eggs and highly seasoned eggs (deviled eggs)  Fats Eat in moderation. Butter and margarine  Mayonnaise and vegetable oils  Mildly seasoned cream sauces and gravies  Plain cream cheese  Sour cream Highly seasoned salad dressings, cream sauces and gravies  Bacon, bacon fat, ham fat, lard and salt pork  Fried foods  Nuts  Fruits Fruit juice  Any canned  or cooked fruit except those listed in the AVOID column ALL fresh fruits, such as citrus, bananas and pineapple  Canned pineapple  Dried fruits, such as raisins, berries  Fruits with seeds, such as berries, kiwi and figs  Meat, Fish, Poultry, and [employer] may be ground, minced or chopped to ease swallowing and digestion  Tender, well cooked and moist cuts of beef, chicken, [place] and pork  Veal and lamb  Flaky, cooked fish  Canned tuna  Cottage and ricotta cheeses  Mild cheese, such as American, brick, mozzarella and baby Swiss  Creamy peanut butter  Plain custard or blended fruit yogurt  Moist casseroles, such as macaroni & cheese, tuna noodle  Grilled or toasted cheese sandwich Tough meats with a lot of gristle  Fried, highly seasoned, smoked and fatty meat, fish or poultry, such as frankfurters, luncheon meats, sausage, bacon, spare ribs, beef brisket, sardines, anchovies, duck and goose  Chili and other entrees made with pepper or chili pepper  Shellfish  Strongly flavored cheeses, such as sharp cheese, extra sharp cheddar, cheese containing peppers or other seasonings  Crunchy peanut butter  Any yogurt with nuts, seeds, coconut, strawberries or raspberries  Potatoes and Starches Peeled, mashed or boiled  white or sweet potatoes  Oven-baked potatoes without skin  Well cooked white rice, enriched noodles, barley, spaghetti, macaroni and other pastas Fried potatoes, potato skins and potato chips  Hard and soft taco shells  Fried, brown or wild rice  Soups Mildly flavored meat stocks  Cream soups made from allowed foods Highly seasoned soups and tomato based soups, cream soups made with gas producing vegetables, such as broccoli, cauliflower, onion, etc.  Sweets and Snacks Use in moderation and do not eat large amounts of sweets by themselves. Syrup, honey, jelly and seedless jam  Plain hard candies and plain candies made with allowed ingredients  Molasses  Marshmallows  Other candy made from allowed ingredients  Thin pretzels Jam, marmalade and preserves  Chocolate in any form  Any candy containing nuts, coconut, seeds, peppermint, spearmint or dried or fresh fruit  Popcorn, potato chips, tortilla chips  Soft or hard thick pretzels, such as sourdough  Vegetables Well cooked soft vegetables without seeds or skins, such as asparagus tips, beets, carrots, green and wax beans, chopped spinach, tender canned baby peas, squash and pumpkin Raw vegetables, tomatoes, tomato juice, tomato sauce and V-8 juice  Gas producing vegetables, such as broccoli, Brussel sprouts, cabbage, cauliflower, onions, corn, cucumber, green peppers, rutabagas, turnips, radishes and sauerkraut  Dried beans, peas and lentils  Miscellaneous Salt and spices in moderation  Mustard and vinegar in moderation Fried or highly seasoned foods  Coconut and seeds  Pickles and olives  Chili sauces, ketchup, barbecue sauce, horseradish, black pepper, chili powder and onion and garlic seasonings  Any other strongly flavored seasoning, condiment, spice or herb not tolerated  Any food not tolerated

## 2020-02-26 NOTE — Progress Notes (Signed)
Outpatient Surgical Follow Up  02/26/2020  April Larsen is an 74 y.o. female.   Chief Complaint  Patient presents with  . Follow-up    Hiatal Hernia    HPI: This patient is a 74 year old female well-known to me with history of paraesophageal hernia.  She does have significant reflux symptoms and chronic cough.  She experiences some retrosternal pain that is intermittent.  She has completed her work-up to include CT scan and barium swallow that I have personally reviewed.  There is evidence of a large paraesophageal hernia with volvulus of the stomach.  There is no evidence of incarceration or strangulation.  About two thirds of her stomach are within the mediastinum. She has recovered from an upper respiratory infection and has completed antibiotics.  She also completed an upper endoscopy showing a large hiatal hernia. She denies any recent angina or dyspnea.  She is otherwise doing well  Past Medical History:  Diagnosis Date  . Actinic keratosis   . Anemia    LAST HGB 15.2 ON 04-22-16  . Anxiety   . Arthritis   . Cancer ([place]) 2016   skin  . Cataract (lens) fragments in eye following cataract surgery, bilateral 2013   one eye done then 4 weeks later the other eye done.  . Chronic ulcerative colitis, without complications ([place]) 8/0/1655  . Diverticulitis   . Esophagitis, reflux 10/04/2014  . Family history of adverse reaction to anesthesia    SISTER HARD TO WAKE UP and nausea and vomiting.  . Female stress incontinence 08/09/2013  . Gastroesophageal reflux disease without esophagitis 08/06/2016  . GERD (gastroesophageal reflux disease)   . Headache    MIGRAINE  . History of hiatal hernia   . Incomplete emptying of bladder 10/29/2013  . Migraine 07/26/2013  . PONV (postoperative nausea and vomiting)    nausea, does not remember vomiting.  Marland Kitchen Postherpetic neuralgia    RIGHT 7TH.CRANIAL NERVE from shingles followed by Bell's palsy  . Postmenopausal 11/09/2016  . PVC (premature  ventricular contraction)   . Scoliosis   . Scoliosis (and kyphoscoliosis), idiopathic 07/26/2013  . Scoliosis of lumbosacral spine 01/18/2015  . Squamous cell carcinoma of skin 03/13/2013   L clavicle   . Squamous cell carcinoma of skin 03/10/2018   L med knee  . Squamous cell carcinoma of skin 02/21/2019   R cheek   . Squamous cell carcinoma of skin 06/01/2019   L mid lat pretibial - ED&C  . Trochanteric bursitis 11/29/2014  . Ulcerative colitis ([place])   . Urethral prolapse 08/09/2013  . Urge incontinence 08/09/2013    Past Surgical History:  Procedure Laterality Date  . ABDOMINAL HYSTERECTOMY    . BACK SURGERY     L3-L4 AND L3-S1 DECOMPRESSION  . CARPAL TUNNEL RELEASE    . CHOLECYSTECTOMY N/A 08/21/2016   Procedure: LAPAROSCOPIC CHOLECYSTECTOMY WITH INTRAOPERATIVE CHOLANGIOGRAM;  Surgeon: Leonie Green, MD;  Location: [facility] ORS;  Service: General;  Laterality: N/A;  . COLONOSCOPY  11/06/2005  . COLONOSCOPY WITH PROPOFOL N/A 07/29/2016   Procedure: COLONOSCOPY WITH PROPOFOL;  Surgeon: Manya Silvas, MD;  Location: [facility] ENDOSCOPY;  Service: Endoscopy;  Laterality: N/A;  . ESOPHAGOGASTRODUODENOSCOPY  11/06/2005  . ESOPHAGOGASTRODUODENOSCOPY (EGD) WITH PROPOFOL N/A 07/29/2016   Procedure: ESOPHAGOGASTRODUODENOSCOPY (EGD) WITH PROPOFOL;  Surgeon: Manya Silvas, MD;  Location: [facility] ENDOSCOPY;  Service: Endoscopy;  Laterality: N/A;  . ESOPHAGOGASTRODUODENOSCOPY (EGD) WITH PROPOFOL N/A 02/07/2020   Procedure: ESOPHAGOGASTRODUODENOSCOPY (EGD) WITH PROPOFOL;  Surgeon: Virgel Manifold, MD;  Location:  [place] ENDOSCOPY;  Service: Endoscopy;  Laterality: N/A;  . LAPAROSCOPIC LOW ANTERIOR RESECTION  11/24/2016   Procedure: LAPAROSCOPIC LOW ANTERIOR RESECTION;  Surgeon: Jules Husbands, MD;  Location: [facility] ORS;  Service: General;;    Family History  Problem Relation Age of Onset  . Breast cancer Sister 27  . Breast cancer Paternal Aunt 85  . Breast cancer Cousin        1st paternal  cousin    Social History:  reports that she has never smoked. She has never used smokeless tobacco. She reports that she does not drink alcohol and does not use drugs.  Allergies:  Allergies  Allergen Reactions  . Nsaids Other (See Comments)    Patient has had part of her colon removed   . Sulfa Antibiotics Rash    Medications reviewed.    ROS Full ROS performed and is otherwise negative other than what is stated in HPI   BP 132/85   Pulse 99   Temp 97.8 F (36.6 C)   Ht 5' 6"  (1.676 m)   Wt 182 lb 6.4 oz (82.7 kg)   SpO2 95%   BMI 29.44 kg/m   Physical Exam Vitals and nursing note reviewed. Exam conducted with a chaperone present.  Constitutional:      General: She is not in acute distress.    Appearance: Normal appearance. She is normal weight.  Eyes:     General: No scleral icterus.       Right eye: No discharge.        Left eye: No discharge.  Cardiovascular:     Rate and Rhythm: Normal rate and regular rhythm.     Heart sounds: No murmur heard.   Pulmonary:     Effort: Pulmonary effort is normal. No respiratory distress.     Breath sounds: Normal breath sounds. No stridor.  Abdominal:     General: Abdomen is flat. There is no distension.     Palpations: Abdomen is soft. There is no mass.     Tenderness: There is no abdominal tenderness. There is no guarding or rebound.     Hernia: No hernia is present.     Comments: Prior mini laparotomy scar no infection no peritonitis  Musculoskeletal:        General: Normal range of motion.     Cervical back: Normal range of motion and neck supple. No rigidity or tenderness.  Skin:    General: Skin is warm and dry.     Capillary Refill: Capillary refill takes less than 2 seconds.  Neurological:     General: No focal deficit present.     Mental Status: She is alert and oriented to person, place, and time.  Psychiatric:        Mood and Affect: Mood normal.        Behavior: Behavior normal.        Thought Content:  Thought content normal.        Judgment: Judgment normal.    Assessment/Plan:  Large symptomatic  paraesophageal hernia.  Discussed with patient detail I do think that this needs to be fixed.  There is evidence of mobilizing the anatomy and she continues to have crescendo symptoms.  I do think that she will be a good candidate for robotic approach.  Procedure discussed with patient in detail.  Risks, benefits and possible implications including but not limited to: Bleeding, infection esophageal injuries, bloating, chronic pain, recurrences.  She understands and wishes to proceed.  Actually  had a cancellation for tomorrow and she wants to get this done as soon as possible.  We will schedule her for tomorrow for robotic paraesophageal hernia repair.  Extensive counseling provided     Greater than 50% of the 45 minutes  visit was spent in counseling/coordination of care   Caroleen Hamman, MD [place] Surgeon

## 2020-02-26 NOTE — Telephone Encounter (Signed)
This case was an add on for the following day per doctor.   Patient has been advised in person of Pre-Admission date/time, COVID Testing date and Surgery date.  Surgery Date: 02/27/20 Preadmission Testing Date: (patient to arrive 2 hrs early, arrive @ 8:00 am ) Covid Testing Date: 02/26/20 - patient advised to go to the [place] ([place] between 8a-1p  Patient to arrive at 8:00 am on 02/27/20 and voices understanding.

## 2020-02-26 NOTE — H&P (View-Only) (Signed)
Outpatient Surgical Follow Up  02/26/2020  Sharmayne Jablon is an 74 y.o. female.   Chief Complaint  Patient presents with  . Follow-up    Hiatal Hernia    HPI: This patient is a 74 year old female well-known to me with history of paraesophageal hernia.  She does have significant reflux symptoms and chronic cough.  She experiences some retrosternal pain that is intermittent.  She has completed her work-up to include CT scan and barium swallow that I have personally reviewed.  There is evidence of a large paraesophageal hernia with volvulus of the stomach.  There is no evidence of incarceration or strangulation.  About two thirds of her stomach are within the mediastinum. She has recovered from an upper respiratory infection and has completed antibiotics.  She also completed an upper endoscopy showing a large hiatal hernia. She denies any recent angina or dyspnea.  She is otherwise doing well  Past Medical History:  Diagnosis Date  . Actinic keratosis   . Anemia    LAST HGB 15.2 ON 04-22-16  . Anxiety   . Arthritis   . Cancer ([place]) 2016   skin  . Cataract (lens) fragments in eye following cataract surgery, bilateral 2013   one eye done then 4 weeks later the other eye done.  . Chronic ulcerative colitis, without complications ([place]) 12/05/9890  . Diverticulitis   . Esophagitis, reflux 10/04/2014  . Family history of adverse reaction to anesthesia    SISTER HARD TO WAKE UP and nausea and vomiting.  . Female stress incontinence 08/09/2013  . Gastroesophageal reflux disease without esophagitis 08/06/2016  . GERD (gastroesophageal reflux disease)   . Headache    MIGRAINE  . History of hiatal hernia   . Incomplete emptying of bladder 10/29/2013  . Migraine 07/26/2013  . PONV (postoperative nausea and vomiting)    nausea, does not remember vomiting.  Marland Kitchen Postherpetic neuralgia    RIGHT 7TH.CRANIAL NERVE from shingles followed by Bell's palsy  . Postmenopausal 11/09/2016  . PVC (premature  ventricular contraction)   . Scoliosis   . Scoliosis (and kyphoscoliosis), idiopathic 07/26/2013  . Scoliosis of lumbosacral spine 01/18/2015  . Squamous cell carcinoma of skin 03/13/2013   L clavicle   . Squamous cell carcinoma of skin 03/10/2018   L med knee  . Squamous cell carcinoma of skin 02/21/2019   R cheek   . Squamous cell carcinoma of skin 06/01/2019   L mid lat pretibial - ED&C  . Trochanteric bursitis 11/29/2014  . Ulcerative colitis ([place])   . Urethral prolapse 08/09/2013  . Urge incontinence 08/09/2013    Past Surgical History:  Procedure Laterality Date  . ABDOMINAL HYSTERECTOMY    . BACK SURGERY     L3-L4 AND L3-S1 DECOMPRESSION  . CARPAL TUNNEL RELEASE    . CHOLECYSTECTOMY N/A 08/21/2016   Procedure: LAPAROSCOPIC CHOLECYSTECTOMY WITH INTRAOPERATIVE CHOLANGIOGRAM;  Surgeon: Leonie Green, MD;  Location: [facility] ORS;  Service: General;  Laterality: N/A;  . COLONOSCOPY  11/06/2005  . COLONOSCOPY WITH PROPOFOL N/A 07/29/2016   Procedure: COLONOSCOPY WITH PROPOFOL;  Surgeon: Manya Silvas, MD;  Location: [facility] ENDOSCOPY;  Service: Endoscopy;  Laterality: N/A;  . ESOPHAGOGASTRODUODENOSCOPY  11/06/2005  . ESOPHAGOGASTRODUODENOSCOPY (EGD) WITH PROPOFOL N/A 07/29/2016   Procedure: ESOPHAGOGASTRODUODENOSCOPY (EGD) WITH PROPOFOL;  Surgeon: Manya Silvas, MD;  Location: [facility] ENDOSCOPY;  Service: Endoscopy;  Laterality: N/A;  . ESOPHAGOGASTRODUODENOSCOPY (EGD) WITH PROPOFOL N/A 02/07/2020   Procedure: ESOPHAGOGASTRODUODENOSCOPY (EGD) WITH PROPOFOL;  Surgeon: Virgel Manifold, MD;  Location:  [facility] ENDOSCOPY;  Service: Endoscopy;  Laterality: N/A;  . LAPAROSCOPIC LOW ANTERIOR RESECTION  11/24/2016   Procedure: LAPAROSCOPIC LOW ANTERIOR RESECTION;  Surgeon: Jules Husbands, MD;  Location: [facility] ORS;  Service: General;;    Family History  Problem Relation Age of Onset  . Breast cancer Sister 59  . Breast cancer Paternal Aunt 14  . Breast cancer Cousin        1st paternal  cousin    Social History:  reports that she has never smoked. She has never used smokeless tobacco. She reports that she does not drink alcohol and does not use drugs.  Allergies:  Allergies  Allergen Reactions  . Nsaids Other (See Comments)    Patient has had part of her colon removed   . Sulfa Antibiotics Rash    Medications reviewed.    ROS Full ROS performed and is otherwise negative other than what is stated in HPI   BP 132/85   Pulse 99   Temp 97.8 F (36.6 C)   Ht 5' 6"  (1.676 m)   Wt 182 lb 6.4 oz (82.7 kg)   SpO2 95%   BMI 29.44 kg/m   Physical Exam Vitals and nursing note reviewed. Exam conducted with a chaperone present.  Constitutional:      General: She is not in acute distress.    Appearance: Normal appearance. She is normal weight.  Eyes:     General: No scleral icterus.       Right eye: No discharge.        Left eye: No discharge.  Cardiovascular:     Rate and Rhythm: Normal rate and regular rhythm.     Heart sounds: No murmur heard.   Pulmonary:     Effort: Pulmonary effort is normal. No respiratory distress.     Breath sounds: Normal breath sounds. No stridor.  Abdominal:     General: Abdomen is flat. There is no distension.     Palpations: Abdomen is soft. There is no mass.     Tenderness: There is no abdominal tenderness. There is no guarding or rebound.     Hernia: No hernia is present.     Comments: Prior mini laparotomy scar no infection no peritonitis  Musculoskeletal:        General: Normal range of motion.     Cervical back: Normal range of motion and neck supple. No rigidity or tenderness.  Skin:    General: Skin is warm and dry.     Capillary Refill: Capillary refill takes less than 2 seconds.  Neurological:     General: No focal deficit present.     Mental Status: She is alert and oriented to person, place, and time.  Psychiatric:        Mood and Affect: Mood normal.        Behavior: Behavior normal.        Thought Content:  Thought content normal.        Judgment: Judgment normal.    Assessment/Plan:  Large symptomatic  paraesophageal hernia.  Discussed with patient detail I do think that this needs to be fixed.  There is evidence of mobilizing the anatomy and she continues to have crescendo symptoms.  I do think that she will be a good candidate for robotic approach.  Procedure discussed with patient in detail.  Risks, benefits and possible implications including but not limited to: Bleeding, infection esophageal injuries, bloating, chronic pain, recurrences.  She understands and wishes to proceed.  Actually  had a cancellation for tomorrow and she wants to get this done as soon as possible.  We will schedule her for tomorrow for robotic paraesophageal hernia repair.  Extensive counseling provided     Greater than 50% of the 45 minutes  visit was spent in counseling/coordination of care   Caroleen Hamman, MD [place] Surgeon

## 2020-02-27 ENCOUNTER — Inpatient Hospital Stay
Admission: RE | Admit: 2020-02-27 | Discharge: 2020-02-28 | DRG: 328 | Disposition: A | Discharge status: Home / Self Care | Payer: PPO | Attending: Surgery | Admitting: Surgery

## 2020-02-27 ENCOUNTER — Inpatient Hospital Stay: Payer: PPO | Admitting: Anesthesiology

## 2020-02-27 ENCOUNTER — Ambulatory Visit: Payer: PPO

## 2020-02-27 ENCOUNTER — Other Ambulatory Visit: Payer: Self-pay

## 2020-02-27 ENCOUNTER — Encounter: Payer: Self-pay | Admitting: Surgery

## 2020-02-27 ENCOUNTER — Encounter
Admission: RE | Disposition: A | Discharge status: Home / Self Care | Payer: Self-pay | Source: Home / Self Care | Attending: Surgery

## 2020-02-27 DIAGNOSIS — Z9049 Acquired absence of other specified parts of digestive tract: Secondary | ICD-10-CM

## 2020-02-27 DIAGNOSIS — Z882 Allergy status to sulfonamides status: Secondary | ICD-10-CM | POA: Diagnosis not present

## 2020-02-27 DIAGNOSIS — D72828 Other elevated white blood cell count: Secondary | ICD-10-CM | POA: Diagnosis not present

## 2020-02-27 DIAGNOSIS — Z20822 Contact with and (suspected) exposure to covid-19: Secondary | ICD-10-CM | POA: Diagnosis present

## 2020-02-27 DIAGNOSIS — Z8719 Personal history of other diseases of the digestive system: Secondary | ICD-10-CM

## 2020-02-27 DIAGNOSIS — M4120 Other idiopathic scoliosis, site unspecified: Secondary | ICD-10-CM | POA: Diagnosis present

## 2020-02-27 DIAGNOSIS — Z803 Family history of malignant neoplasm of breast: Secondary | ICD-10-CM

## 2020-02-27 DIAGNOSIS — Z85828 Personal history of other malignant neoplasm of skin: Secondary | ICD-10-CM

## 2020-02-27 DIAGNOSIS — Z90710 Acquired absence of both cervix and uterus: Secondary | ICD-10-CM

## 2020-02-27 DIAGNOSIS — K219 Gastro-esophageal reflux disease without esophagitis: Secondary | ICD-10-CM | POA: Diagnosis present

## 2020-02-27 DIAGNOSIS — K3189 Other diseases of stomach and duodenum: Secondary | ICD-10-CM | POA: Diagnosis present

## 2020-02-27 DIAGNOSIS — Z888 Allergy status to other drugs, medicaments and biological substances status: Secondary | ICD-10-CM

## 2020-02-27 DIAGNOSIS — Z9889 Other specified postprocedural states: Secondary | ICD-10-CM

## 2020-02-27 DIAGNOSIS — K449 Diaphragmatic hernia without obstruction or gangrene: Principal | ICD-10-CM

## 2020-02-27 HISTORY — PX: XI ROBOTIC ASSISTED PARAESOPHAGEAL HERNIA REPAIR: SHX6871

## 2020-02-27 LAB — CBC
HCT: 41.8 % (ref 36.0–46.0)
Hemoglobin: 14.1 g/dL (ref 12.0–15.0)
MCH: 31.5 pg (ref 26.0–34.0)
MCHC: 33.7 g/dL (ref 30.0–36.0)
MCV: 93.3 fL (ref 80.0–100.0)
Platelets: 304 10*3/uL (ref 150–400)
RBC: 4.48 MIL/uL (ref 3.87–5.11)
RDW: 13.2 % (ref 11.5–15.5)
WBC: 19.9 10*3/uL — ABNORMAL HIGH (ref 4.0–10.5)
nRBC: 0 % (ref 0.0–0.2)

## 2020-02-27 LAB — GLUCOSE, CAPILLARY: Glucose-Capillary: 97 mg/dL (ref 70–99)

## 2020-02-27 LAB — CBC WITH DIFFERENTIAL/PLATELET
Abs Immature Granulocytes: 0.03 10*3/uL (ref 0.00–0.07)
Basophils Absolute: 0.1 10*3/uL (ref 0.0–0.1)
Basophils Relative: 1 %
Eosinophils Absolute: 0.2 10*3/uL (ref 0.0–0.5)
Eosinophils Relative: 3 %
HCT: 40.7 % (ref 36.0–46.0)
Hemoglobin: 14.3 g/dL (ref 12.0–15.0)
Immature Granulocytes: 0 %
Lymphocytes Relative: 17 %
Lymphs Abs: 1.5 10*3/uL (ref 0.7–4.0)
MCH: 32 pg (ref 26.0–34.0)
MCHC: 35.1 g/dL (ref 30.0–36.0)
MCV: 91.1 fL (ref 80.0–100.0)
Monocytes Absolute: 1.1 10*3/uL — ABNORMAL HIGH (ref 0.1–1.0)
Monocytes Relative: 12 %
Neutro Abs: 6.2 10*3/uL (ref 1.7–7.7)
Neutrophils Relative %: 67 %
Platelets: 351 10*3/uL (ref 150–400)
RBC: 4.47 MIL/uL (ref 3.87–5.11)
RDW: 13.2 % (ref 11.5–15.5)
WBC: 9.2 10*3/uL (ref 4.0–10.5)
nRBC: 0 % (ref 0.0–0.2)

## 2020-02-27 LAB — COMPREHENSIVE METABOLIC PANEL
ALT: 25 U/L (ref 0–44)
AST: 24 U/L (ref 15–41)
Albumin: 4.2 g/dL (ref 3.5–5.0)
Alkaline Phosphatase: 80 U/L (ref 38–126)
Anion gap: 11 (ref 5–15)
BUN: 24 mg/dL — ABNORMAL HIGH (ref 8–23)
CO2: 23 mmol/L (ref 22–32)
Calcium: 9.7 mg/dL (ref 8.9–10.3)
Chloride: 104 mmol/L (ref 98–111)
Creatinine, Ser: 0.9 mg/dL (ref 0.44–1.00)
GFR, Estimated: >60 mL/min (ref >60)
Glucose, Bld: 96 mg/dL (ref 70–99)
Potassium: 3.9 mmol/L (ref 3.5–5.1)
Sodium: 138 mmol/L (ref 135–145)
Total Bilirubin: 0.9 mg/dL (ref 0.3–1.2)
Total Protein: 6.9 g/dL (ref 6.5–8.1)

## 2020-02-27 LAB — CREATININE, SERUM
Creatinine, Ser: 0.8 mg/dL (ref 0.44–1.00)
GFR, Estimated: >60 mL/min (ref >60)

## 2020-02-27 SURGERY — REPAIR, HERNIA, PARAESOPHAGEAL, ROBOT-ASSISTED
Anesthesia: General

## 2020-02-27 MED ORDER — PROPOFOL 10 MG/ML IV BOLUS
INTRAVENOUS | Status: AC
  Filled 2020-02-27: qty 20

## 2020-02-27 MED ORDER — CHLORHEXIDINE GLUCONATE 0.12 % MT SOLN: 15.0000 mL | Freq: Once | OROMUCOSAL | Status: AC

## 2020-02-27 MED ORDER — CEFAZOLIN SODIUM-DEXTROSE 2-4 GM/100ML-% IV SOLN
2.0000 g | Freq: Three times a day (TID) | INTRAVENOUS | Status: AC
  Administered 2020-02-27 – 2020-02-28 (×2): 2 g via INTRAVENOUS
  Filled 2020-02-27 (×4): qty 100

## 2020-02-27 MED ORDER — ONDANSETRON 4 MG PO TBDP: 4.0000 mg | ORAL_TABLET | Freq: Four times a day (QID) | ORAL | Status: DC | PRN

## 2020-02-27 MED ORDER — MIDAZOLAM HCL 2 MG/2ML IJ SOLN
INTRAMUSCULAR | Status: DC | PRN
  Administered 2020-02-27: 1 mg via INTRAVENOUS

## 2020-02-27 MED ORDER — ROCURONIUM BROMIDE 10 MG/ML (PF) SYRINGE
PREFILLED_SYRINGE | INTRAVENOUS | Status: AC
  Filled 2020-02-27: qty 10

## 2020-02-27 MED ORDER — ORAL CARE MOUTH RINSE: 15.0000 mL | Freq: Once | OROMUCOSAL | Status: AC

## 2020-02-27 MED ORDER — ACETAMINOPHEN 500 MG PO TABS
1000.0000 mg | ORAL_TABLET | Freq: Four times a day (QID) | ORAL | Status: DC
  Administered 2020-02-27 – 2020-02-28 (×4): 1000 mg via ORAL
  Filled 2020-02-27 (×4): qty 2

## 2020-02-27 MED ORDER — PROPOFOL 10 MG/ML IV BOLUS
INTRAVENOUS | Status: DC | PRN
  Administered 2020-02-27: 60 mg via INTRAVENOUS
  Administered 2020-02-27: 160 mg via INTRAVENOUS

## 2020-02-27 MED ORDER — DIPHENHYDRAMINE HCL 25 MG PO CAPS: 25.0000 mg | ORAL_CAPSULE | Freq: Four times a day (QID) | ORAL | Status: DC | PRN

## 2020-02-27 MED ORDER — DULOXETINE HCL 30 MG PO CPEP
60.0000 mg | ORAL_CAPSULE | Freq: Every day | ORAL | Status: DC
  Administered 2020-02-27: 60 mg via ORAL
  Filled 2020-02-27: qty 2

## 2020-02-27 MED ORDER — ENOXAPARIN SODIUM 40 MG/0.4ML ~~LOC~~ SOLN
40.0000 mg | SUBCUTANEOUS | Status: DC
  Administered 2020-02-28: 40 mg via SUBCUTANEOUS
  Filled 2020-02-27: qty 0.4

## 2020-02-27 MED ORDER — ROCURONIUM BROMIDE 100 MG/10ML IV SOLN
INTRAVENOUS | Status: DC | PRN
Start: 2020-02-27 — End: 2020-02-27
  Administered 2020-02-27: 10 mg via INTRAVENOUS
  Administered 2020-02-27: 60 mg via INTRAVENOUS

## 2020-02-27 MED ORDER — DIPHENHYDRAMINE HCL 50 MG/ML IJ SOLN: 25.0000 mg | Freq: Four times a day (QID) | INTRAMUSCULAR | Status: DC | PRN

## 2020-02-27 MED ORDER — DEXAMETHASONE SODIUM PHOSPHATE 10 MG/ML IJ SOLN
INTRAMUSCULAR | Status: DC | PRN
  Administered 2020-02-27: 5 mg via INTRAVENOUS

## 2020-02-27 MED ORDER — PROCHLORPERAZINE MALEATE 10 MG PO TABS
10.0000 mg | ORAL_TABLET | Freq: Four times a day (QID) | ORAL | Status: DC | PRN
  Filled 2020-02-27: qty 1

## 2020-02-27 MED ORDER — BUPROPION HCL ER (XL) 150 MG PO TB24
150.0000 mg | ORAL_TABLET | Freq: Every day | ORAL | Status: DC
  Administered 2020-02-28: 150 mg via ORAL
  Filled 2020-02-27: qty 1

## 2020-02-27 MED ORDER — ONDANSETRON HCL 4 MG/2ML IJ SOLN: 4.0000 mg | Freq: Four times a day (QID) | INTRAMUSCULAR | Status: DC | PRN

## 2020-02-27 MED ORDER — OXYCODONE HCL 5 MG PO TABS
5.0000 mg | ORAL_TABLET | ORAL | Status: DC | PRN
  Administered 2020-02-27 – 2020-02-28 (×4): 10 mg via ORAL
  Filled 2020-02-27 (×4): qty 2

## 2020-02-27 MED ORDER — DEXAMETHASONE SODIUM PHOSPHATE 10 MG/ML IJ SOLN
INTRAMUSCULAR | Status: AC
  Filled 2020-02-27: qty 1

## 2020-02-27 MED ORDER — FENTANYL CITRATE (PF) 100 MCG/2ML IJ SOLN
INTRAMUSCULAR | Status: DC | PRN
  Administered 2020-02-27: 50 ug via INTRAVENOUS

## 2020-02-27 MED ORDER — ONDANSETRON HCL 4 MG/2ML IJ SOLN: 4.0000 mg | Freq: Once | INTRAMUSCULAR | Status: DC | PRN

## 2020-02-27 MED ORDER — PHENYLEPHRINE HCL (PRESSORS) 10 MG/ML IV SOLN
INTRAVENOUS | Status: DC | PRN
  Administered 2020-02-27 (×3): 100 ug via INTRAVENOUS

## 2020-02-27 MED ORDER — LIDOCAINE HCL (PF) 2 % IJ SOLN
INTRAMUSCULAR | Status: AC
  Filled 2020-02-27: qty 5

## 2020-02-27 MED ORDER — PHENYLEPHRINE HCL (PRESSORS) 10 MG/ML IV SOLN
INTRAVENOUS | Status: AC
  Filled 2020-02-27: qty 1

## 2020-02-27 MED ORDER — MORPHINE SULFATE (PF) 2 MG/ML IV SOLN
2.0000 mg | INTRAVENOUS | Status: DC | PRN
  Administered 2020-02-27 – 2020-02-28 (×2): 2 mg via INTRAVENOUS
  Filled 2020-02-27 (×2): qty 1

## 2020-02-27 MED ORDER — FENTANYL CITRATE (PF) 100 MCG/2ML IJ SOLN
INTRAMUSCULAR | Status: AC
  Filled 2020-02-27: qty 2

## 2020-02-27 MED ORDER — FENTANYL CITRATE (PF) 100 MCG/2ML IJ SOLN
25.0000 ug | INTRAMUSCULAR | Status: DC | PRN
  Administered 2020-02-27 (×5): 25 ug via INTRAVENOUS

## 2020-02-27 MED ORDER — EPHEDRINE SULFATE 50 MG/ML IJ SOLN
INTRAMUSCULAR | Status: DC | PRN
  Administered 2020-02-27: 7.5 mg via INTRAVENOUS
  Administered 2020-02-27 (×2): 5 mg via INTRAVENOUS
  Administered 2020-02-27: 10 mg via INTRAVENOUS
  Administered 2020-02-27: 5 mg via INTRAVENOUS
  Administered 2020-02-27 (×2): 10 mg via INTRAVENOUS

## 2020-02-27 MED ORDER — BUPIVACAINE-EPINEPHRINE (PF) 0.25% -1:200000 IJ SOLN
INTRAMUSCULAR | Status: AC
  Filled 2020-02-27: qty 30

## 2020-02-27 MED ORDER — GABAPENTIN 300 MG PO CAPS
ORAL_CAPSULE | ORAL | Status: AC
  Administered 2020-02-27: 300 mg via ORAL
  Filled 2020-02-27: qty 1

## 2020-02-27 MED ORDER — CHLORHEXIDINE GLUCONATE CLOTH 2 % EX PADS: 6.0000 | MEDICATED_PAD | Freq: Once | CUTANEOUS | Status: DC

## 2020-02-27 MED ORDER — MIDAZOLAM HCL 2 MG/2ML IJ SOLN
INTRAMUSCULAR | Status: AC
  Filled 2020-02-27: qty 2

## 2020-02-27 MED ORDER — CEFAZOLIN SODIUM-DEXTROSE 2-4 GM/100ML-% IV SOLN
INTRAVENOUS | Status: AC
  Filled 2020-02-27: qty 100

## 2020-02-27 MED ORDER — LIDOCAINE HCL (CARDIAC) PF 100 MG/5ML IV SOSY
PREFILLED_SYRINGE | INTRAVENOUS | Status: DC | PRN
  Administered 2020-02-27: 100 mg via INTRAVENOUS

## 2020-02-27 MED ORDER — GUAIFENESIN-DM 100-10 MG/5ML PO SYRP
5.0000 mL | ORAL_SOLUTION | ORAL | Status: DC | PRN
  Administered 2020-02-27: 5 mL via ORAL
  Filled 2020-02-27: qty 5

## 2020-02-27 MED ORDER — BUPIVACAINE-EPINEPHRINE 0.25% -1:200000 IJ SOLN
INTRAMUSCULAR | Status: DC | PRN
  Administered 2020-02-27: 30 mL

## 2020-02-27 MED ORDER — BUPIVACAINE LIPOSOME 1.3 % IJ SUSP
INTRAMUSCULAR | Status: AC
  Filled 2020-02-27: qty 20

## 2020-02-27 MED ORDER — PROCHLORPERAZINE EDISYLATE 10 MG/2ML IJ SOLN: 5.0000 mg | Freq: Four times a day (QID) | INTRAMUSCULAR | Status: DC | PRN

## 2020-02-27 MED ORDER — HEPARIN SODIUM (PORCINE) 5000 UNIT/ML IJ SOLN
INTRAMUSCULAR | Status: AC
  Administered 2020-02-27: 5000 [IU] via SUBCUTANEOUS
  Filled 2020-02-27: qty 1

## 2020-02-27 MED ORDER — ACETAMINOPHEN 500 MG PO TABS
ORAL_TABLET | ORAL | Status: AC
  Administered 2020-02-27: 1000 mg via ORAL
  Filled 2020-02-27: qty 2

## 2020-02-27 MED ORDER — METOPROLOL TARTRATE 5 MG/5ML IV SOLN: 5.0000 mg | Freq: Four times a day (QID) | INTRAVENOUS | Status: DC | PRN

## 2020-02-27 MED ORDER — PREGABALIN 50 MG PO CAPS
100.0000 mg | ORAL_CAPSULE | Freq: Three times a day (TID) | ORAL | Status: DC
  Administered 2020-02-27 – 2020-02-28 (×2): 100 mg via ORAL
  Filled 2020-02-27 (×2): qty 2

## 2020-02-27 MED ORDER — LACTATED RINGERS IV SOLN: INTRAVENOUS | Status: DC

## 2020-02-27 MED ORDER — ONDANSETRON HCL 4 MG/2ML IJ SOLN
INTRAMUSCULAR | Status: AC
  Filled 2020-02-27: qty 2

## 2020-02-27 MED ORDER — CHLORHEXIDINE GLUCONATE 0.12 % MT SOLN
OROMUCOSAL | Status: AC
  Administered 2020-02-27: 15 mL via OROMUCOSAL
  Filled 2020-02-27: qty 15

## 2020-02-27 MED ORDER — BUPIVACAINE LIPOSOME 1.3 % IJ SUSP
INTRAMUSCULAR | Status: DC | PRN
  Administered 2020-02-27: 20 mL

## 2020-02-27 MED ORDER — ONDANSETRON HCL 4 MG/2ML IJ SOLN
INTRAMUSCULAR | Status: DC | PRN
  Administered 2020-02-27: 4 mg via INTRAVENOUS

## 2020-02-27 MED ORDER — SUGAMMADEX SODIUM 200 MG/2ML IV SOLN
INTRAVENOUS | Status: DC | PRN
  Administered 2020-02-27 (×2): 100 mg via INTRAVENOUS

## 2020-02-27 SURGICAL SUPPLY — 55 items
CANISTER SUCT 1200ML W/VALVE (MISCELLANEOUS) IMPLANT
CANNULA REDUC XI 12-8 STAPL (CANNULA) ×1
CANNULA REDUCER 12-8 DVNC XI (CANNULA) ×1 IMPLANT
CHLORAPREP W/TINT 26 (MISCELLANEOUS) ×2 IMPLANT
COVER WAND RF STERILE (DRAPES) ×2 IMPLANT
DECANTER SPIKE VIAL GLASS SM (MISCELLANEOUS) ×2 IMPLANT
DEFOGGER SCOPE WARMER CLEARIFY (MISCELLANEOUS) ×2 IMPLANT
DERMABOND ADVANCED (GAUZE/BANDAGES/DRESSINGS) ×1
DERMABOND ADVANCED .7 DNX12 (GAUZE/BANDAGES/DRESSINGS) ×1 IMPLANT
DRAPE 3/4 80X56 (DRAPES) ×2 IMPLANT
DRAPE ARM DVNC X/XI (DISPOSABLE) ×4 IMPLANT
DRAPE COLUMN DVNC XI (DISPOSABLE) ×1 IMPLANT
DRAPE DA VINCI XI ARM (DISPOSABLE) ×4
DRAPE DA VINCI XI COLUMN (DISPOSABLE) ×1
ELECT CAUTERY BLADE 6.4 (BLADE) ×2 IMPLANT
ELECT REM PT RETURN 9FT ADLT (ELECTROSURGICAL) ×2
ELECTRODE REM PT RTRN 9FT ADLT (ELECTROSURGICAL) ×1 IMPLANT
GLOVE BIO SURGEON STRL SZ7 (GLOVE) ×6 IMPLANT
GOWN STRL REUS W/ TWL LRG LVL3 (GOWN DISPOSABLE) ×4 IMPLANT
GOWN STRL REUS W/TWL LRG LVL3 (GOWN DISPOSABLE) ×4
GRASPER LAPSCPC 5X45 DSP (INSTRUMENTS) ×2 IMPLANT
IRRIGATION STRYKERFLOW (MISCELLANEOUS) ×1 IMPLANT
IRRIGATOR STRYKERFLOW (MISCELLANEOUS) ×2
IV NS 1000ML (IV SOLUTION)
IV NS 1000ML BAXH (IV SOLUTION) IMPLANT
KIT PINK PAD W/HEAD ARE REST (MISCELLANEOUS) ×2
KIT PINK PAD W/HEAD ARM REST (MISCELLANEOUS) ×1 IMPLANT
KIT TURNOVER CYSTO (KITS) ×2 IMPLANT
LABEL OR SOLS (LABEL) ×2 IMPLANT
MANIFOLD NEPTUNE II (INSTRUMENTS) ×2 IMPLANT
NEEDLE HYPO 22GX1.5 SAFETY (NEEDLE) ×2 IMPLANT
OBTURATOR OPTICAL STANDARD 8MM (TROCAR) ×1
OBTURATOR OPTICAL STND 8 DVNC (TROCAR) ×1
OBTURATOR OPTICALSTD 8 DVNC (TROCAR) ×1 IMPLANT
PACK LAP CHOLECYSTECTOMY (MISCELLANEOUS) ×2 IMPLANT
PENCIL ELECTRO HAND CTR (MISCELLANEOUS) ×2 IMPLANT
SCISSORS METZENBAUM CVD 33 (INSTRUMENTS) ×2 IMPLANT
SEAL CANN UNIV 5-8 DVNC XI (MISCELLANEOUS) ×3 IMPLANT
SEAL XI 5MM-8MM UNIVERSAL (MISCELLANEOUS) ×3
SEALER VESSEL DA VINCI XI (MISCELLANEOUS) ×1
SEALER VESSEL EXT DVNC XI (MISCELLANEOUS) ×1 IMPLANT
SOLUTION ELECTROLUBE (MISCELLANEOUS) ×2 IMPLANT
SPONGE LAP 18X18 RF (DISPOSABLE) ×2 IMPLANT
STAPLER CANNULA SEAL DVNC XI (STAPLE) ×1 IMPLANT
STAPLER CANNULA SEAL XI (STAPLE) ×1
SUT MNCRL 4-0 (SUTURE) ×1
SUT MNCRL 4-0 27XMFL (SUTURE) ×1
SUT SILK 2 0 SH (SUTURE) ×4 IMPLANT
SUT VICRYL 0 AB UR-6 (SUTURE) ×4 IMPLANT
SUT VLOC 90 S/L VL9 GS22 (SUTURE) ×2 IMPLANT
SUTURE MNCRL 4-0 27XMF (SUTURE) ×1 IMPLANT
TRAY FOLEY SLVR 16FR LF STAT (SET/KITS/TRAYS/PACK) ×2 IMPLANT
TROCAR BALLN GELPORT 12X130M (ENDOMECHANICALS) ×2 IMPLANT
TROCAR XCEL NON-BLD 5MMX100MML (ENDOMECHANICALS) ×2 IMPLANT
TUBING EVAC SMOKE HEATED PNEUM (TUBING) ×2 IMPLANT

## 2020-02-27 NOTE — Op Note (Signed)
Robotic assisted laparoscopic Nissen fundoplication w repair of  Paraesophageal hernia Type III  Pre-operative Diagnosis: GERD, hiatal hernia  Post-operative Diagnosis: same  Procedure:  Robotic assisted laparoscopic Nissen fundoplication w repair of  hiatal hernia  Surgeon: Caroleen Hamman, MD FACS  Assistant: Diamantina Providence CST   Anesthesia: Gen. with endotracheal tube  Findings: Large paraesophageal hernia type III with 2/3 of the stomach within mediastinum Loose wrap 360 degree over 52 FR Bougie Tension free repair  Estimated Blood Loss: 15cc             Complications: none   Procedure Details  The patient was seen again in the Holding Room. The benefits, complications, treatment options, and expected outcomes were discussed with the patient. The risks of bleeding, infection, recurrence of symptoms, failure to resolve symptoms,  esophageal damage, Dysphagia, bowel injury, any of which could require further surgery were reviewed with the patient. The likelihood of improving the patient's symptoms with return to their baseline status is good.  The patient and/or family concurred with the proposed plan, giving informed consent.  The patient was taken to Operating Room, identified  and the procedure verified.  A Time Out was held and the above information confirmed.  Prior to the induction of general anesthesia, antibiotic prophylaxis was administered. VTE prophylaxis was in place. General endotracheal anesthesia was then administered and tolerated well. After the induction, the abdomen was prepped with Chloraprep and draped in the sterile fashion. The patient was positioned in the supine position.  Cut down technique was used to enter the abdominal cavity and a Hasson trochar was placed after two vicryl stitches were anchored to the fascia. Pneumoperitoneum was then created with CO2 and tolerated well without any adverse changes in the patient's vital signs.  Three 8-mm ports were  placed under direct vision. All skin incisions  were infiltrated with a local anesthetic agent before making the incision and placing the trocars. An additional 5 mm regular laparoscopic port was placed to assist with retraction and exposure.  I started with LOA with scissors laparoscopically since there was some omentum adhered to abdominal wall.  No evidence of bowel injuries visualized.  The patient was positioned  in reverse Trendelenburg, robot was brought to the surgical field and docked in the standard fashion.  We made sure all the instrumentation was kept indirect view at all times and that there were no collision between the arms. I scrubbed out and went to the console.  I used a robotic arm to retract the liver, the vessel sealer on my right hand and a forced bipolar grasper on my left hand.  There is along the extra 5 mm port allow me ample exposure and the ability to perform meticulous dissection  We Started dividing the lesser omentum via the pars flaccida.  We Were able to dissect the lesser curvature of the stomach and  dissected the fundus free from the right and left crus.  We circumferentially dissected the GE junction.  The hernia sac was also completely reduced and we were able to bring the stomach into the intra-abdominal position.  Attention then was turned to the greater curvature where the short gastrics were divided with sealer device.  We were able to identify the left crus and again were able to make sure there was a good circumferential dissection and that the hernia sac was completely excised.  We did perform also a significant dissection within the mediastinum to allow a complete reduction of the sac and a  to completely allow an intra-abdominal Nissen fundoplication.  2-0V lock suture was inserted and the crus as well as the hernia was closed with a running suture  We Asked anesthesia to place a 52 French bougie and this went easily.  We also observe trajectory of the  bougie. 360 degree Nissen fundoplication was created with multiple 2-0 silk sutures and we placed 3 stitches taking some of the esophagus within that bite.  The fundoplication measured approximately 3-1/2 cm and he was floppy. I was very happy with the way the fundoplication laid and the repair of the hernia.  Inspection of the  upper quadrant was performed. No bleeding, bile  Or esophageal injuries leaks, or bowel injuries were noted. Robotic instruments and robotic arms were undocked in the standard fashion. All the needles were removed under direct visualization.   I scrubbed back in.  Pneumoperitoneum was released.  The periumbilical port site was closed with interrumpted 0 Vicryl sutures. 4-0 subcuticular Monocryl was used to close the skin. Liposomal marcaine was injected to all the incisions sites.  Dermabond was  applied.  The patient was then extubated and brought to the recovery room in stable condition. Sponge, lap, and needle counts were correct at closure and at the conclusion of the case.               Caroleen Hamman, MD, FACS

## 2020-02-27 NOTE — Interval H&P Note (Signed)
History and Physical Interval Note:  02/27/2020 10:35 AM  April Larsen  has presented today for surgery, with the diagnosis of paraesophageal hernia.  The various methods of treatment have been discussed with the patient and family. After consideration of risks, benefits and other options for treatment, the patient has consented to  Procedure(s): XI ROBOTIC ASSISTED PARAESOPHAGEAL HERNIA REPAIR (N/A) as a surgical intervention.  The patient's history has been reviewed, patient examined, no change in status, stable for surgery.  I have reviewed the patient's chart and labs.  Questions were answered to the patient's satisfaction.     [place]

## 2020-02-27 NOTE — Anesthesia Procedure Notes (Signed)
Procedure Name: Intubation Date/Time: 02/27/2020 11:22 AM Performed by: Zetta Bills, CRNA Pre-anesthesia Checklist: Patient identified, Emergency Drugs available, Suction available and Patient being monitored Patient Re-evaluated:Patient Re-evaluated prior to induction Oxygen Delivery Method: Circle system utilized Preoxygenation: Pre-oxygenation with 100% oxygen Induction Type: IV induction Ventilation: Mask ventilation without difficulty Laryngoscope Size: Mac and 3 Grade View: Grade II Tube type: Oral Tube size: 7.0 mm Number of attempts: 1 Airway Equipment and Method: Stylet

## 2020-02-27 NOTE — Transfer of Care (Signed)
Immediate Anesthesia Transfer of Care Note  Patient: April Larsen  Procedure(s) Performed: XI ROBOTIC ASSISTED PARAESOPHAGEAL HERNIA REPAIR (N/A )  Patient Location: PACU  Anesthesia Type:General  Level of Consciousness: drowsy  Airway & Oxygen Therapy: Patient Spontanous Breathing  Post-op Assessment: Report given to RN  Post vital signs: stable  Last Vitals:  Vitals Value Taken Time  BP 138/75 02/27/20 1430  Temp 36.1 C 02/27/20 1430  Pulse 81 02/27/20 1435  Resp 18 02/27/20 1435  SpO2 95 % 02/27/20 1435  Vitals shown include unvalidated device data.  Last Pain:  Vitals:   02/27/20 1430  TempSrc:   PainSc: 0-No pain         Complications: No complications documented.

## 2020-02-27 NOTE — Anesthesia Preprocedure Evaluation (Signed)
Anesthesia Evaluation  Patient identified by MRN, date of birth, ID band Patient awake    Reviewed: Allergy & Precautions, NPO status , Patient's Chart, lab work & pertinent test results  History of Anesthesia Complications (+) PONV and history of anesthetic complications  Airway Mallampati: III       Dental   Pulmonary neg sleep apnea, neg COPD, Not current smoker,           Cardiovascular (-) hypertension(-) Past MI and (-) CHF (-) dysrhythmias (-) Valvular Problems/Murmurs     Neuro/Psych neg Seizures Anxiety Depression    GI/Hepatic Neg liver ROS, hiatal hernia, PUD, GERD  Medicated,  Endo/Other  neg diabetes  Renal/GU negative Renal ROS     Musculoskeletal   Abdominal   Peds  Hematology  (+) anemia ,   Anesthesia Other Findings   Reproductive/Obstetrics                             Anesthesia Physical Anesthesia Plan  ASA: III  Anesthesia Plan: General   Post-op Pain Management:    Induction: Intravenous  PONV Risk Score and Plan: 4 or greater and Dexamethasone and Ondansetron  Airway Management Planned: Oral ETT  Additional Equipment:   Intra-op Plan:   Post-operative Plan:   Informed Consent: I have reviewed the patients History and Physical, chart, labs and discussed the procedure including the risks, benefits and alternatives for the proposed anesthesia with the patient or authorized representative who has indicated his/her understanding and acceptance.       Plan Discussed with:   Anesthesia Plan Comments:         Anesthesia Quick Evaluation

## 2020-02-27 NOTE — Anesthesia Postprocedure Evaluation (Signed)
Anesthesia Post Note  Patient: April Larsen  Procedure(s) Performed: XI ROBOTIC ASSISTED PARAESOPHAGEAL HERNIA REPAIR (N/A )  Patient location during evaluation: PACU Anesthesia Type: General Level of consciousness: awake and awake and alert Pain management: pain level controlled Vital Signs Assessment: post-procedure vital signs reviewed and stable Respiratory status: spontaneous breathing Cardiovascular status: blood pressure returned to baseline and stable Postop Assessment: no apparent nausea or vomiting Anesthetic complications: no   No complications documented.   Last Vitals:  Vitals:   02/27/20 0832 02/27/20 1430  BP: (!) 144/79 138/75  Pulse: 86 84  Resp: 16 16  Temp: 36.8 C (!) 36.1 C  SpO2: 98% 93%    Last Pain:  Vitals:   02/27/20 1430  TempSrc:   PainSc: 0-No pain                 Neva Seat

## 2020-02-28 ENCOUNTER — Encounter: Payer: Self-pay | Admitting: Surgery

## 2020-02-28 LAB — CBC
HCT: 40 % (ref 36.0–46.0)
Hemoglobin: 13.3 g/dL (ref 12.0–15.0)
MCH: 31.6 pg (ref 26.0–34.0)
MCHC: 33.3 g/dL (ref 30.0–36.0)
MCV: 95 fL (ref 80.0–100.0)
Platelets: 319 10*3/uL (ref 150–400)
RBC: 4.21 MIL/uL (ref 3.87–5.11)
RDW: 13.3 % (ref 11.5–15.5)
WBC: 14.2 10*3/uL — ABNORMAL HIGH (ref 4.0–10.5)
nRBC: 0 % (ref 0.0–0.2)

## 2020-02-28 LAB — BASIC METABOLIC PANEL
Anion gap: 9 (ref 5–15)
BUN: 15 mg/dL (ref 8–23)
CO2: 25 mmol/L (ref 22–32)
Calcium: 9 mg/dL (ref 8.9–10.3)
Chloride: 103 mmol/L (ref 98–111)
Creatinine, Ser: 0.79 mg/dL (ref 0.44–1.00)
GFR, Estimated: >60 mL/min (ref >60)
Glucose, Bld: 109 mg/dL — ABNORMAL HIGH (ref 70–99)
Potassium: 3.8 mmol/L (ref 3.5–5.1)
Sodium: 137 mmol/L (ref 135–145)

## 2020-02-28 MED ORDER — OXYCODONE HCL 5 MG PO TABS: 5.0000 mg | ORAL_TABLET | Freq: Four times a day (QID) | ORAL | 0 refills | Status: DC | PRN

## 2020-02-28 NOTE — Progress Notes (Signed)
[place] Hospital Day(s): 1.   Post op day(s): 1 Day Post-Op.   Interval History:  Patient seen and examined no acute events or new complaints overnight.  Patient reports she has a lot of back soreness this morning, which is chronic in nature given scoliosis but exacerbated.  She also complains of diffuse abdominal soreness No fever, chills, nausea, emesis, or reflux symptoms She does have a mild reactive leukocytosis to 14.2 Labs are otherwise unremarkable She is tolerating full liquid diet without issues  She does live alone and has some hesitation with going home today given her pain.    Vital signs in last 24 hours: [min-max] current  Temp:  [97 F (36.1 C)-98.2 F (36.8 C)] 98.2 F (36.8 C) (12/01 0836) Pulse Rate:  [77-93] 89 (12/01 0836) Resp:  [10-41] 15 (12/01 0836) BP: (128-163)/(69-98) 153/96 (12/01 0836) SpO2:  [88 %-99 %] 92 % (12/01 0836) FiO2 (%):  [21 %] 21 % (11/30 1446)     Height: 5' 5.5" (166.4 cm) Weight: 81.6 kg BMI (Calculated): 29.49   Intake/Output last 2 shifts:  11/30 0701 - 12/01 0700 In: 1370 [P.O.:120; I.V.:1250] Out: 190 [Urine:175; Blood:15]   Physical Exam:  Constitutional: alert, cooperative and no distress  Respiratory: breathing non-labored at rest  Cardiovascular: regular rate and sinus rhythm  Gastrointestinal: Soft, incisional soreness, non-distended, no rebound/guarding Integumentary: Laparoscopic incisions are CDI with dermabond, some bruising, no erythema or drainage  Labs:  CBC Latest Ref Rng & Units 02/28/2020 02/27/2020 02/27/2020  WBC 4.0 - 10.5 K/uL 14.2(H) 19.9(H) 9.2  Hemoglobin 12.0 - 15.0 g/dL 13.3 14.1 14.3  Hematocrit 36 - 46 % 40.0 41.8 40.7  Platelets 150 - 400 K/uL 319 304 351   CMP Latest Ref Rng & Units 02/28/2020 02/27/2020 02/27/2020  Glucose 70 - 99 mg/dL 109(H) - 96  BUN 8 - 23 mg/dL 15 - 24(H)  Creatinine 0.44 - 1.00 mg/dL 0.79 0.80 0.90  Sodium 135 - 145  mmol/L 137 - 138  Potassium 3.5 - 5.1 mmol/L 3.8 - 3.9  Chloride 98 - 111 mmol/L 103 - 104  CO2 22 - 32 mmol/L 25 - 23  Calcium 8.9 - 10.3 mg/dL 9.0 - 9.7  Total Protein 6.5 - 8.1 g/dL - - 6.9  Total Bilirubin 0.3 - 1.2 mg/dL - - 0.9  Alkaline Phos 38 - 126 U/L - - 80  AST 15 - 41 U/L - - 24  ALT 0 - 44 U/L - - 25    Imaging studies: No new pertinent imaging studies   Assessment/Plan: 74 y.o. female doing well, with expected post-surgical pain 1 Day Post-Op s/p robotic assisted laparoscopic nissen fundoplication.   - Okay to continue full liquid diet; she can be advanced if she follows Nissen Diet plan, which I provided to her   - Wean from IVF  - Monitor abdominal examination  - Pain control prn; antiemetics prn   - Mobilization encouraged    - Discharge Planning; Anticipate discharge in AM   All of the above findings and recommendations were discussed with the patient, and the medical team, and all of patient's questions were answered to her expressed satisfaction.  -- Edison Simon, PA-C [facility] 02/28/2020, 9:35 AM [phone] M-F: 7am - 4pm

## 2020-02-28 NOTE — Discharge Summary (Signed)
[facility] SURGICAL DISCHARGE SUMMARY  Patient ID: April Larsen MRN: [id number] DOB/AGE: [birth year] 74 y.o.  Admit date: 02/27/2020 Discharge date: 02/28/2020  Discharge Diagnoses Patient Active Problem List   Diagnosis Date Noted  . S/P repair of paraesophageal hernia 02/27/2020    Consultants None  Procedures 02/27/2020:  Robotic assisted laparoscopic nissen fundoplication  HPI: April Larsen is a 74 y.o. female with a history of hiatal hernia and reflux symptoms who presents to [facility] on 11/30 for scheduled robotic assisted laparoscopic nissen fundoplication with Dr Dahlia Byes.   Hospital Course: Informed consent was obtained and documented, and patient underwent uneventful robotic assisted laparoscopic nissen fundoplication (Dr Dahlia Byes, [id number]).  Post-operatively, patient ultimately did well and advancement of patient's diet and ambulation were well-tolerated. The remainder of patient's hospital course was essentially unremarkable, and discharge planning was initiated accordingly with patient safely able to be discharged home with appropriate discharge instructions, pain control, and outpatient follow-up after all of her questions were answered to her expressed satisfaction.   Discharge Condition: Good    Allergies as of 02/28/2020      Reactions   Nsaids Other (See Comments)   Patient has had part of her colon removed    Sulfa Antibiotics Rash      Medication List    TAKE these medications   B-complex with vitamin C tablet Take 1 tablet by mouth daily.   buPROPion 150 MG 24 hr tablet Commonly known as: WELLBUTRIN XL Take 150 mg by mouth daily.   CALCIUM 600+D PO Take 1 tablet by mouth 2 (two) times daily.   DULoxetine 60 MG capsule Commonly known as: CYMBALTA Take 60 mg by mouth at bedtime.   omeprazole 20 MG capsule Commonly known as: PRILOSEC Take 20 mg by mouth 2 (two) times daily.   oxyCODONE 5 MG immediate release tablet Commonly  known as: Oxy IR/ROXICODONE Take 1 tablet (5 mg total) by mouth every 6 (six) hours as needed for severe pain or breakthrough pain.   traMADol 50 MG tablet Commonly known as: ULTRAM Take 100 mg by mouth 2 (two) times daily.   TYLENOL 500 MG tablet Generic drug: acetaminophen Take 1,000 mg by mouth every 6 (six) hours as needed for moderate pain or headache.   Vitamin D3 50 MCG (2000 UT) Tabs Take 2,000 Units by mouth daily.         Follow-up Information    [place], [place] F, MD. Schedule an appointment as soon as possible for a visit in 2 week(s).   Specialty: General Surgery Why: s/p nissen fundoplication Contact information: [address] [place] [id number] [phone]                Time spent on discharge management including discussion of hospital course, clinical condition, outpatient instructions, prescriptions, and follow up with the patient and members of the medical team: >30 minutes  -- Edison Simon , PA-C [facility]  02/28/2020, 3:17 PM [phone] M-F: 7am - 4pm

## 2020-03-04 ENCOUNTER — Ambulatory Visit: Payer: PPO

## 2020-03-05 ENCOUNTER — Telehealth: Payer: Self-pay

## 2020-03-05 NOTE — Telephone Encounter (Signed)
Patient recently had surgery- she complains of constipation- she was instructed to try Miralx daily until normal bowel movements. Stated she was bloated and gassy from not moving bowel. She was instructed to increase fluids as well.

## 2020-03-11 ENCOUNTER — Encounter: Payer: Self-pay | Admitting: Surgery

## 2020-03-11 ENCOUNTER — Other Ambulatory Visit: Payer: Self-pay

## 2020-03-11 ENCOUNTER — Encounter: Payer: PPO | Admitting: Dermatology

## 2020-03-11 ENCOUNTER — Ambulatory Visit (INDEPENDENT_AMBULATORY_CARE_PROVIDER_SITE_OTHER): Payer: PPO | Admitting: Surgery

## 2020-03-11 VITALS — BP 120/82 | HR 97 | Temp 97.9°F | Ht 65.5 in | Wt 178.2 lb

## 2020-03-11 DIAGNOSIS — Z09 Encounter for follow-up examination after completed treatment for conditions other than malignant neoplasm: Secondary | ICD-10-CM

## 2020-03-11 NOTE — Patient Instructions (Addendum)
Dr Dahlia Byes discussed with patient to continue at home regimen to help with constipation or diarrhea. Patient will follow up with Dr Dahlia Byes in 3 months.  Please give our office a call, if you have any problems or concerns to arise before follow up appointment.  GENERAL POST-OPERATIVE PATIENT INSTRUCTIONS   WOUND CARE INSTRUCTIONS:  Keep a dry clean dressing on the wound if there is drainage. The initial bandage may be removed after 24 hours.  Once the wound has quit draining you may leave it open to air.  If clothing rubs against the wound or causes irritation and the wound is not draining you may cover it with a dry dressing during the daytime.  Try to keep the wound dry and avoid ointments on the wound unless directed to do so.  If the wound becomes bright red and painful or starts to drain infected material that is not clear, please contact your physician immediately.  If the wound is mildly pink and has a thick firm ridge underneath it, this is normal, and is referred to as a healing ridge.  This will resolve over the next 4-6 weeks.  BATHING: You may shower if you have been informed of this by your surgeon. However, Please do not submerge in a tub, hot tub, or pool until incisions are completely sealed or have been told by your surgeon that you may do so.  DIET:  You may eat any foods that you can tolerate.  It is a good idea to eat a high fiber diet and take in plenty of fluids to prevent constipation.  If you do become constipated you may want to take a mild laxative or take ducolax tablets on a daily basis until your bowel habits are regular.  Constipation can be very uncomfortable, along with straining, after recent surgery.  ACTIVITY:  You are encouraged to cough and deep breath or use your incentive spirometer if you were given one, every 15-30 minutes when awake.  This will help prevent respiratory complications and low grade fevers post-operatively if you had a general anesthetic.  You may  want to hug a pillow when coughing and sneezing to add additional support to the surgical area, if you had abdominal or chest surgery, which will decrease pain during these times.  You are encouraged to walk and engage in light activity for the next two weeks.  You should not lift more than 20 pounds, until 4 to 6 weeks after surgery as it could put you at increased risk for complications.  Twenty pounds is roughly equivalent to a plastic bag of groceries. At that time- Listen to your body when lifting, if you have pain when lifting, stop and then try again in a few days. Soreness after doing exercises or activities of daily living is normal as you get back in to your normal routine.  MEDICATIONS:  Try to take narcotic medications and anti-inflammatory medications, such as tylenol, ibuprofen, naprosyn, etc., with food.  This will minimize stomach upset from the medication.  Should you develop nausea and vomiting from the pain medication, or develop a rash, please discontinue the medication and contact your physician.  You should not drive, make important decisions, or operate machinery when taking narcotic pain medication.  SUNBLOCK Use sun block to incision area over the next year if this area will be exposed to sun. This helps decrease scarring and will allow you avoid a permanent darkened area over your incision.  QUESTIONS:  Please feel free to  call our office if you have any questions, and we will be glad to assist you. [phone].  Eating Plan After Nissen Fundoplication After a Nissen fundoplication procedure, it is common to have some difficulty swallowing. The part of your body that moves food and liquid from your mouth to your stomach (esophagus) will be swollen and may feel tight. It will take several weeks or months for your esophagus and stomach to heal. By following a special eating plan, you can prevent problems such as pain, swelling or pressure in the abdomen (bloating), gas, nausea, or  diarrhea. What are tips for following this plan? Cooking  Cook all foods until they are soft.  Remove skins and seeds from fruits and vegetables before eating.  Remove skin and gristle from meats before eating.  Grind or finely mince meats before eating.  Avoid over-cooking meat. Dry, tough meat is more difficult to swallow.  Avoid or use small amounts of oil when cooking.  Avoid or use small amounts of seasoning when cooking.  Toast bread before eating. This makes it easier to swallow.  Allow hot soups and drinks to cool before eating. Meal planning   Eat 6-8 small meals throughout the day.  Right after the surgery, have a few meals that are only clear liquids. Clear liquids include: ? Water. ? Clear fruit juice (no pulp). ? Chicken, beef, or vegetable broth. ? Gelatin. ? Decaffeinated tea or coffee without milk. ? Ice pops or shaved ice.  Depending on your progress, you may move to a full liquid diet as told by your health care provider. This includes clear liquids and the following: ? Dairy and alternative milks (soy). ? Strained creamed soups. ? Ice cream or sherbet. ? Pudding. ? Nutritional supplement drinks. ? Yogurt.  A few days after surgery, you may be able to start eating a diet of soft foods. You may need to eat according to this plan for several weeks.  Avoid foods and drinks that contain caffeine and chocolate.  Do not drink carbonated drinks or alcohol.  Avoid foods and drinks that contain citrus or tomato.  Do not eat sweets or sweetened drinks at the beginning of a meal. Doing that may cause your stomach to empty faster than it should (dumping syndrome).  Avoid foods that cause gas, such as beans, peas, broccoli, or cabbage.  If dairy milk products cause diarrhea, avoid them or eat them in small amounts. Lifestyle   Always sit upright when eating or drinking.  Eat slowly. Take small bites and chew food well before swallowing.  Do not lie  down after eating. Stay sitting up for 30 minutes or longer after each meal.  Sip fluids between meals.  Do not mix solid foods and liquids in the same mouthful.  Limit how much you drink at one time. With meals and snacks, have 4-8 oz (120-240 mL). This is equal to  cup-1 cup.  Drink enough fluid to keep your urine pale yellow.  Do not chew gum or drink fluids through a straw. Doing those things may cause you to swallow extra air. Recommended foods The items listed may not be a complete list. Talk with your dietitian about what dietary choices are best for you. Grains  Cooked cereals. Dry cereals softened with liquid. Cooked pasta, rice, or other grains. Toasted bread. Bland crackers, such as soda or graham crackers. Vegetables  Any soft-cooked vegetables after skins and seeds are removed. Vegetable juice. Fruits  Any soft-cooked fruits after skins and  seeds are removed. Fruit juice. Meats and other protein foods  Tender cuts of meat, poultry, or fish after bones, skin, and gristle are removed. Poached, boiled, or scrambled eggs. Canned fish. Tofu. Creamy nut butters. Dairy  Milk. Yogurt. Cottage cheese. Mild cheeses. Beverages  Nutritional supplement drinks. Decaffeinated tea or coffee. Sports drinks. Fats and oils  Butter. Margarine. Mayonnaise. Vegetable oil. Smooth salad dressing. Sweets and desserts  Plain hard candy. Marshmallows. Pudding. Ice cream. Gelatin. Sherbet. Seasoning and other foods  Salt. Light seasonings. Mustard. Vinegar. Foods to avoid The items listed may not be a complete list. Talk with your dietitian about what dietary choices are best for you. Grains  High-fiber or bran cereal. Cereal with nuts, dried fruit, or coconut. Sweet breads, rolls, coffee cake, or donuts. Chewy or crusty breads. Popcorn. Vegetables  Tomato sauce. Tomato juice. Broccoli. Cauliflower. Cabbage. Brussels sprouts. Crunchy, raw vegetables. Fruits  Oranges. Grapefruit.  Lemons. Limes. Citrus juices. Dried fruit. Crunchy, raw fruits. Meats and other protein foods  Beans, peas, and lentils. Tough or fatty meats. Fried meats, chicken, or fish. Fried eggs. Nuts and seeds. Crunchy nut butters. Dairy  Chocolate milk. Yogurt with chunks of fruit, nuts, seeds, or coconut. Strong cheeses. Beverages  Carbonated soft drinks. Alcohol. Cocoa. Hot drinks. Fats and oils  Bacon fat. Lard. Sweets and desserts  Chocolate. Candy with nuts, coconut, or seeds. Peppermint. Cookies. Cakes. Pie crust. Seasoning and other foods  Heavy seasonings. Chili sauce. Ketchup. Barbecue sauce. Angie Fava. Horseradish. Summary  Following this eating plan after a Nissen fundoplication is an important part of healing after surgery.  After surgery, you will start with a clear liquid diet before you progress to full liquids and soft foods. You may need to eat soft foods for several weeks.  Avoid eating foods that cause irritation, gas, nausea, diarrhea, or swelling or pressure in the abdomen (bloating), and avoid foods that are difficult to swallow.  Talk with your diet and nutrition specialist (dietitian) about what dietary choices are best for you. This information is not intended to replace advice given to you by your health care provider. Make sure you discuss any questions you have with your health care provider. Document Revised: 02/26/2017 Document Reviewed: 09/28/2016 Elsevier Patient Education  [place].

## 2020-03-12 NOTE — Progress Notes (Signed)
April Larsen  Is f/u for robotic H/H repair .  He denies any dysphagia she has been tolerating her diet.  No fevers no chills no vomiting.  She is walking and has no major concerns.  Denies any reflux  Physical exam: No acute distress Abdomen: Soft incisions healing well without infection minimal appropriate tenderness.  No peritonitis   A/P doing very well RTC 3 months No heavy lifting

## 2020-03-18 ENCOUNTER — Telehealth: Payer: Self-pay | Admitting: *Deleted

## 2020-03-18 DIAGNOSIS — Z09 Encounter for follow-up examination after completed treatment for conditions other than malignant neoplasm: Secondary | ICD-10-CM

## 2020-03-18 MED ORDER — ONDANSETRON 4 MG PO TBDP: 4.0000 mg | ORAL_TABLET | Freq: Three times a day (TID) | ORAL | 0 refills | Status: DC | PRN

## 2020-03-18 NOTE — Telephone Encounter (Signed)
Paraesophageal Hernia repair-02/27/20 Dr.Pabon- patient complaining of constipation April Larsen takes stool softener-and miralax but sometimes she has diarrhea and she is trying to find a happy medium- she is complaining of nausea in the mornings-  she is taking Omeprazole-she did burp yesterday and spit up a little-she is having some gas- -spoke with Thedore Mins and lets try Miralax every other day-stop the Omeprazole if not having reflux-patient states she is having indigestion- patient stated she is not eating that much -but when she does she eats small amounts- I let her know I would send this over to Dr.Pabon as well.

## 2020-03-18 NOTE — Telephone Encounter (Signed)
CT Chest/abdomen cancelled per Dr.Pabon. Patient wanted to keep 04/09/19 appointment with Dr.Pabon- patient verbalized understanding-

## 2020-03-18 NOTE — Telephone Encounter (Signed)
Patient called and stated that she had surgery on 02/27/20 Dr Dahlia Byes and she has questions about what she should be eating and if she is eating the wrong things. She has been constipated, she took Miralax and a stool softer, spend all day in the bathroom with diarreha then became constipated again. She stated she feels bad all day, described it as feeling "washed out". Everything she eats causes indigestion and nausea. She drunk water this morning and then had to burp which resulted in water coming up. Last night she had some back pain that was all across her back and into her shoulder. She slept in recliner. Patient would like a call back.

## 2020-03-18 NOTE — Telephone Encounter (Signed)
CT chest/Abdomen scheduled 04/05/19 @ 12:45 [facility]. NPO 4 hours prior-  Please pick up your prep kit and instructions. Also DR.Pabon 04/09/19. Patient verbalized understanding.

## 2020-03-25 ENCOUNTER — Ambulatory Visit
Admission: RE | Admit: 2020-03-25 | Discharge: 2020-03-25 | Disposition: A | Discharge status: Home / Self Care | Payer: PPO | Source: Ambulatory Visit | Attending: Internal Medicine | Admitting: Internal Medicine

## 2020-03-25 ENCOUNTER — Other Ambulatory Visit: Payer: Self-pay

## 2020-03-25 DIAGNOSIS — Z1231 Encounter for screening mammogram for malignant neoplasm of breast: Secondary | ICD-10-CM | POA: Diagnosis not present

## 2020-03-26 DIAGNOSIS — M5116 Intervertebral disc disorders with radiculopathy, lumbar region: Secondary | ICD-10-CM | POA: Diagnosis not present

## 2020-03-26 DIAGNOSIS — M4127 Other idiopathic scoliosis, lumbosacral region: Secondary | ICD-10-CM | POA: Diagnosis not present

## 2020-04-04 ENCOUNTER — Ambulatory Visit: Payer: PPO

## 2020-04-08 ENCOUNTER — Other Ambulatory Visit: Payer: Self-pay

## 2020-04-08 ENCOUNTER — Ambulatory Visit (INDEPENDENT_AMBULATORY_CARE_PROVIDER_SITE_OTHER): Payer: PPO | Admitting: Surgery

## 2020-04-08 ENCOUNTER — Encounter: Payer: Self-pay | Admitting: Surgery

## 2020-04-08 VITALS — BP 152/84 | HR 88 | Temp 97.8°F | Ht 65.0 in | Wt 174.6 lb

## 2020-04-08 DIAGNOSIS — R109 Unspecified abdominal pain: Secondary | ICD-10-CM

## 2020-04-08 NOTE — Patient Instructions (Addendum)
Take Miralax everyday (up to twice a day) for constipation. We will call you with appointment for the CT scan. Follow up appointment listed below.

## 2020-04-09 ENCOUNTER — Telehealth: Payer: Self-pay

## 2020-04-09 NOTE — Telephone Encounter (Signed)
Pt has been scheduled for a CT on 04/17/20 @ 11:30 am at [facility] in [place]. Pt notified. Verbalizes understanding.

## 2020-04-13 NOTE — Progress Notes (Signed)
April Larsen is a 75 year old female status post robotic paraesophageal hernia repair approximately 6 weeks ago.  She did very well initially now has some nonspecific abdominal pain.  She does have some constipation and diarrhea.  She does have some nausea.  No fevers no chills.  She is taking p.o. and able to keep things down.  PE NAD Abd : soft, incisions c/d/i no infection or peritonitis  A/P nonspecific GI symptoms following paraesophageal hernia repair   We will start work-up with a CT scan of the chest and abdomen to assess the anatomy and the reduction of the paraesophageal hernia.  I have also discussed with her that after her repair  there is some significant changes in swallowing. She still fresh from surgery. We will see her back after w/u is completed.

## 2020-04-17 ENCOUNTER — Ambulatory Visit: Admission: RE | Admit: 2020-04-17 | Payer: PPO | Source: Ambulatory Visit

## 2020-04-22 ENCOUNTER — Ambulatory Visit: Payer: Self-pay | Admitting: Surgery

## 2020-04-23 DIAGNOSIS — B351 Tinea unguium: Secondary | ICD-10-CM | POA: Diagnosis not present

## 2020-04-23 DIAGNOSIS — G5762 Lesion of plantar nerve, left lower limb: Secondary | ICD-10-CM | POA: Diagnosis not present

## 2020-04-23 DIAGNOSIS — M19071 Primary osteoarthritis, right ankle and foot: Secondary | ICD-10-CM | POA: Diagnosis not present

## 2020-04-24 ENCOUNTER — Ambulatory Visit: Payer: PPO

## 2020-04-25 ENCOUNTER — Other Ambulatory Visit: Payer: Self-pay

## 2020-04-25 ENCOUNTER — Ambulatory Visit
Admission: RE | Admit: 2020-04-25 | Discharge: 2020-04-25 | Disposition: A | Discharge status: Home / Self Care | Payer: PPO | Source: Ambulatory Visit | Attending: Surgery | Admitting: Surgery

## 2020-04-25 DIAGNOSIS — I700 Atherosclerosis of aorta: Secondary | ICD-10-CM | POA: Diagnosis not present

## 2020-04-25 DIAGNOSIS — Z90710 Acquired absence of both cervix and uterus: Secondary | ICD-10-CM | POA: Diagnosis not present

## 2020-04-25 DIAGNOSIS — K3189 Other diseases of stomach and duodenum: Secondary | ICD-10-CM | POA: Diagnosis not present

## 2020-04-25 DIAGNOSIS — R109 Unspecified abdominal pain: Secondary | ICD-10-CM | POA: Diagnosis not present

## 2020-04-25 DIAGNOSIS — Z9049 Acquired absence of other specified parts of digestive tract: Secondary | ICD-10-CM | POA: Diagnosis not present

## 2020-04-25 LAB — POCT I-STAT CREATININE: Creatinine, Ser: 0.8 mg/dL (ref 0.44–1.00)

## 2020-04-25 MED ORDER — IOHEXOL 300 MG/ML  SOLN
100.0000 mL | Freq: Once | INTRAMUSCULAR | Status: AC | PRN
  Administered 2020-04-25: 100 mL via INTRAVENOUS

## 2020-04-26 ENCOUNTER — Telehealth: Payer: Self-pay

## 2020-04-26 NOTE — Telephone Encounter (Signed)
Pt notified of CT results. Verbalizes understanding.

## 2020-04-29 ENCOUNTER — Ambulatory Visit (INDEPENDENT_AMBULATORY_CARE_PROVIDER_SITE_OTHER): Payer: PPO | Admitting: Surgery

## 2020-04-29 ENCOUNTER — Other Ambulatory Visit: Payer: Self-pay

## 2020-04-29 ENCOUNTER — Encounter: Payer: Self-pay | Admitting: Surgery

## 2020-04-29 VITALS — BP 136/87 | HR 89 | Temp 98.2°F | Ht 65.5 in | Wt 175.6 lb

## 2020-04-29 DIAGNOSIS — Z09 Encounter for follow-up examination after completed treatment for conditions other than malignant neoplasm: Secondary | ICD-10-CM

## 2020-04-29 NOTE — Patient Instructions (Addendum)
If you have any concerns or questions, please feel free to call our office. See follow up appointment below.

## 2020-04-30 ENCOUNTER — Encounter: Payer: Self-pay | Admitting: Surgery

## 2020-04-30 NOTE — Progress Notes (Signed)
Outpatient Surgical Follow Up  04/30/2020  April Larsen is an 75 y.o. female.   Chief Complaint  Patient presents with  . Follow-up    CT results. Abd pain    HPI: Following with a recent CT.  CT scan personally reviewed there is minimal sliding hernia that is significant as compared to preoperative CT scan.  She feels much better as compared to the last visit.  She does have significant bloating.  No fevers no chills tolerating diet.  Past Medical History:  Diagnosis Date  . Actinic keratosis   . Anemia    LAST HGB 15.2 ON 04-22-16  . Anxiety   . Arthritis   . Cancer ([place]) 2016   skin  . Cataract (lens) fragments in eye following cataract surgery, bilateral 2013   one eye done then 4 weeks later the other eye done.  . Chronic ulcerative colitis, without complications ([place]) 09/04/6718  . Diverticulitis   . Esophagitis, reflux 10/04/2014  . Family history of adverse reaction to anesthesia    SISTER HARD TO WAKE UP and nausea and vomiting.  . Female stress incontinence 08/09/2013  . Gastroesophageal reflux disease without esophagitis 08/06/2016  . GERD (gastroesophageal reflux disease)   . Headache    MIGRAINE  . History of hiatal hernia   . Incomplete emptying of bladder 10/29/2013  . Migraine 07/26/2013  . PONV (postoperative nausea and vomiting)    nausea, does not remember vomiting.  Marland Kitchen Postherpetic neuralgia    RIGHT 7TH.CRANIAL NERVE from shingles followed by Bell's palsy  . Postmenopausal 11/09/2016  . PVC (premature ventricular contraction)   . Scoliosis   . Scoliosis (and kyphoscoliosis), idiopathic 07/26/2013  . Scoliosis of lumbosacral spine 01/18/2015  . Squamous cell carcinoma of skin 03/13/2013   L clavicle   . Squamous cell carcinoma of skin 03/10/2018   L med knee  . Squamous cell carcinoma of skin 02/21/2019   R cheek   . Squamous cell carcinoma of skin 06/01/2019   L mid lat pretibial - ED&C  . Trochanteric bursitis 11/29/2014  . Ulcerative colitis ([place])   .  Urethral prolapse 08/09/2013  . Urge incontinence 08/09/2013    Past Surgical History:  Procedure Laterality Date  . ABDOMINAL HYSTERECTOMY    . BACK SURGERY     L3-L4 AND L3-S1 DECOMPRESSION  . CARPAL TUNNEL RELEASE    . CHOLECYSTECTOMY N/A 08/21/2016   Procedure: LAPAROSCOPIC CHOLECYSTECTOMY WITH INTRAOPERATIVE CHOLANGIOGRAM;  Surgeon: Leonie Green, MD;  Location: [facility] ORS;  Service: General;  Laterality: N/A;  . COLONOSCOPY  11/06/2005  . COLONOSCOPY WITH PROPOFOL N/A 07/29/2016   Procedure: COLONOSCOPY WITH PROPOFOL;  Surgeon: Manya Silvas, MD;  Location: [facility] ENDOSCOPY;  Service: Endoscopy;  Laterality: N/A;  . ESOPHAGOGASTRODUODENOSCOPY  11/06/2005  . ESOPHAGOGASTRODUODENOSCOPY (EGD) WITH PROPOFOL N/A 07/29/2016   Procedure: ESOPHAGOGASTRODUODENOSCOPY (EGD) WITH PROPOFOL;  Surgeon: Manya Silvas, MD;  Location[facility] ENDOSCOPY;  Service: Endoscopy;  Laterality: N/A;  . ESOPHAGOGASTRODUODENOSCOPY (EGD) WITH PROPOFOL N/A 02/07/2020   Procedure: ESOPHAGOGASTRODUODENOSCOPY (EGD) WITH PROPOFOL;  Surgeon: Virgel Manifold, MD;  Location: [facility] ENDOSCOPY;  Service: Endoscopy;  Laterality: N/A;  . LAPAROSCOPIC LOW ANTERIOR RESECTION  11/24/2016   Procedure: LAPAROSCOPIC LOW ANTERIOR RESECTION;  Surgeon: Jules Husbands, MD;  Location: [facility] ORS;  Service: General;;  . XI ROBOTIC ASSISTED PARAESOPHAGEAL HERNIA REPAIR N/A 02/27/2020   Procedure: XI ROBOTIC ASSISTED PARAESOPHAGEAL HERNIA REPAIR;  Surgeon: Jules Husbands, MD;  Location: [facility] ORS;  Service: General;  Laterality: N/A;  Family History  Problem Relation Age of Onset  . Breast cancer Sister 65  . Breast cancer Paternal Aunt 34  . Breast cancer Cousin        1st paternal cousin    Social History:  reports that she has never smoked. She has never used smokeless tobacco. She reports that she does not drink alcohol and does not use drugs.  Allergies:  Allergies  Allergen Reactions  . Nsaids Other (See Comments)     Patient has had part of her colon removed   . Sulfa Antibiotics Rash    Medications reviewed.    ROS Full ROS performed and is otherwise negative other than what is stated in HPI   BP 136/87   Pulse 89   Temp 98.2 F (36.8 C) (Oral)   Ht 5' 5.5" (1.664 m)   Wt 175 lb 9.6 oz (79.7 kg)   SpO2 93%   BMI 28.78 kg/m   Physical Exam NAD ,alert Abd: soft, nt , incisions healed, no infection or hernias.  Assessment/Plan:  Bloating after paraesophageal hernia repair and nissen, d/w pt in detail.  She is still relatively fresh from her repair and is adjusting to new postoperative changes.  I do not see any signs of complications.  We will continue with post Nissen diet and I will see her back in a couple months.  No need for any surgical intervention    Caroleen Hamman, MD [place] [facility]

## 2020-05-08 ENCOUNTER — Ambulatory Visit: Payer: PPO | Admitting: Gastroenterology

## 2020-05-08 ENCOUNTER — Encounter: Payer: Self-pay | Admitting: Gastroenterology

## 2020-05-08 ENCOUNTER — Other Ambulatory Visit: Payer: Self-pay

## 2020-05-08 VITALS — BP 144/83 | HR 105 | Temp 98.2°F | Ht 65.5 in | Wt 175.2 lb

## 2020-05-08 DIAGNOSIS — Z8719 Personal history of other diseases of the digestive system: Secondary | ICD-10-CM

## 2020-05-08 DIAGNOSIS — K449 Diaphragmatic hernia without obstruction or gangrene: Secondary | ICD-10-CM

## 2020-05-08 NOTE — Patient Instructions (Signed)
Psyllium granules or powder for solution What is this medicine? PSYLLIUM (SIL i yum) is a bulk-forming fiber laxative. This medicine is used to treat constipation. Increasing fiber in the diet may also help lower cholesterol and promote heart health for some people. This medicine may be used for other purposes; ask your health care provider or pharmacist if you have questions. COMMON BRAND NAME(S): Fiber Therapy, GenFiber, Geri-Mucil, Hydrocil, Konsyl, Metamucil, Metamucil MultiHealth, Mucilin, Natural Fiber Therapy, Reguloid What should I tell my health care provider before I take this medicine? They need to know if you have any of these conditions:  blockage in your bowel  difficulty swallowing  inflammatory bowel disease  phenylketonuria  stomach or intestine problems  sudden change in bowel habits lasting more than 2 weeks  an unusual or allergic reaction to psyllium, other medicines, dyes, or preservatives  pregnant or trying or get pregnant  breast-feeding How should I use this medicine? Mix this medicine into a full glass (240 mL) of water or other cool drink. Take this medicine by mouth. Follow the directions on the package labeling, or take as directed by your health care professional. Take your medicine at regular intervals. Do not take your medicine more often than directed. Talk to your pediatrician regarding the use of this medicine in children. While this drug may be prescribed for children as young as 45 years old for selected conditions, precautions do apply. Overdosage: If you think you have taken too much of this medicine contact a poison control center or emergency room at once. NOTE: This medicine is only for you. Do not share this medicine with others. What if I miss a dose? If you miss a dose, take it as soon as you can. If it is almost time for your next dose, take only that dose. Do not take double or extra doses. What may interact with this  medicine? Interactions are not expected. Take this product at least 2 hours before or after other medicines. This list may not describe all possible interactions. Give your health care provider a list of all the medicines, herbs, non-prescription drugs, or dietary supplements you use. Also tell them if you smoke, drink alcohol, or use illegal drugs. Some items may interact with your medicine. What should I watch for while using this medicine? Check with your doctor or health care professional if your symptoms do not start to get better or if they get worse. Stop using this medicine and contact your doctor or health care professional if you have rectal bleeding or if you have to treat your constipation for more than 1 week. These could be signs of a more serious condition. Drink several glasses of water a day while you are taking this medicine. This will help to relieve constipation and prevent dehydration. What side effects may I notice from receiving this medicine? Side effects that you should report to your doctor or health care professional as soon as possible:  allergic reactions like skin rash, itching or hives, swelling of the face, lips, or tongue  breathing problems  chest pain  nausea, vomiting  rectal bleeding  trouble swallowing Side effects that usually do not require medical attention (report to your doctor or health care professional if they continue or are bothersome):  bloating  gas  stomach cramps This list may not describe all possible side effects. Call your doctor for medical advice about side effects. You may report side effects to FDA at 1-800-FDA-1088. Where should I keep my medicine?  Keep out of the reach of children. Store at room temperature between 15 and 30 degrees C (59 and 86 degrees F). Protect from moisture. Throw away any unused medicine after the expiration date. NOTE: This sheet is a summary. It may not cover all possible information. If you have  questions about this medicine, talk to your doctor, pharmacist, or health care provider.  2021 Elsevier/Gold Standard (2017-08-10 15:41:08)

## 2020-05-09 NOTE — Progress Notes (Signed)
April Antigua, MD [address]  [place]  [place], [place] [address]  Main: [phone]  Fax: [phone]   Primary Care Physician: Rusty Aus, MD   Chief complaint: History of hiatal hernia  HPI: April Larsen is a 75 y.o. female with history of hiatal hernia, with surgery for repair in November 2021 here for follow-up.  Patient has been doing well as far as her upper GI symptoms since hiatal hernia repair  She was previously followed by [facility], Dr. Vira Agar, and has a history of ulcerative colitis.  However, she has not been on any medications for years.  Over the years, patient has had some urinary  incontinence, and reports urgency with her bowel movements.  However, she also reports constipation on some days, followed by days where she has a loose bowel movement.  No blood in stool.  No weight loss.  No abdominal pain.  No nausea or vomiting.  Previous history: Patient has a history of chronic ulcerative colitis documented in her chart.  On further review of her previous documentation, she was seen by [facility] for this but the history is somewhat unclear.  She has not been on any UC meds for years.I am able to see a pathology report from 2013 in her chart that shows mild chronic inactive colitis in all the biopsy specimens.  Procedure report not available.  She had a colonoscopy in 2018 that was normal with biopsies showing unremarkable colonic mucosa throughout.  She also had laparoscopic sigmoid resection for diverticulitis in 2018.  As per February 2018 note by Denice Paradise, Dr. Percell Boston PA, "Patient was hospitalized for C-diff colitis in 2010. Her colonoscopy 02/08/09 showing diverticulosis in the sigmoid colon, descending colon, transverse colon, internal hemorrhoids, and colon otherwise appeared normal. Biopsies of the ileocecal valve showed mild active colitis with architectural features of chronicity.The patient was having a lot of  diarrhea at the time, quite symptomatic, with elevated ESR/CRP and Dr. Vira Agar felt the colonoscopy was not that impressive. She was given Entocort."  Assessment plan on that same note advises to stop the Entocort.  ?So essentially patient was on Entocort without a clear diagnosis from 2010 to 2018  Current Outpatient Medications  Medication Sig Dispense Refill  . acetaminophen (TYLENOL) 500 MG tablet Take 1,000 mg by mouth every 6 (six) hours as needed for moderate pain or headache.     . B Complex-C (B-COMPLEX WITH VITAMIN C) tablet Take 1 tablet by mouth daily.    Marland Kitchen buPROPion (WELLBUTRIN XL) 150 MG 24 hr tablet Take 150 mg by mouth daily.    . Calcium Carbonate-Vitamin D (CALCIUM 600+D PO) Take 1 tablet by mouth 2 (two) times daily.    . Cholecalciferol (VITAMIN D3) 50 MCG (2000 UT) TABS Take 2,000 Units by mouth daily.    . DULoxetine (CYMBALTA) 60 MG capsule Take 60 mg by mouth at bedtime.   3  . omeprazole (PRILOSEC) 20 MG capsule Take 20 mg by mouth 2 (two) times daily.  3  . ondansetron (ZOFRAN ODT) 4 MG disintegrating tablet Take 1 tablet (4 mg total) by mouth every 8 (eight) hours as needed for nausea or vomiting. 20 tablet 0  . traMADol (ULTRAM) 50 MG tablet Take 100 mg by mouth 2 (two) times daily.      No current facility-administered medications for this visit.    Allergies as of 05/08/2020 - Review Complete 05/08/2020  Allergen Reaction Noted  . Nsaids Other (  See Comments) 01/05/2020  . Sulfa antibiotics Rash 08/24/2015    ROS:  General: Negative for anorexia, weight loss, fever, chills, fatigue, weakness. ENT: Negative for hoarseness, difficulty swallowing , nasal congestion. CV: Negative for chest pain, angina, palpitations, dyspnea on exertion, peripheral edema.  Respiratory: Negative for dyspnea at rest, dyspnea on exertion, cough, sputum, wheezing.  GI: See history of present illness. GU:  Negative for dysuria, hematuria, urinary incontinence, urinary frequency,  nocturnal urination.  Endo: Negative for unusual weight change.    Physical Examination:   BP (!) 144/83   Pulse (!) 105   Temp 98.2 F (36.8 C) (Oral)   Ht 5' 5.5" (1.664 m)   Wt 175 lb 3.2 oz (79.5 kg)   BMI 28.71 kg/m   General: Well-nourished, well-developed in no acute distress.  Eyes: No icterus. Conjunctivae pink. Mouth: Oropharyngeal mucosa moist and pink , no lesions erythema or exudate. Neck: Supple, Trachea midline Abdomen: Bowel sounds are normal, nontender, nondistended, no hepatosplenomegaly or masses, no abdominal bruits or hernia , no rebound or guarding.   Extremities: No lower extremity edema. No clubbing or deformities. Neuro: Alert and oriented x 3.  Grossly intact. Skin: Warm and dry, no jaundice.   Psych: Alert and cooperative, normal mood and affect.   Labs: CMP     Component Value Date/Time   NA 137 02/28/2020 0530   NA 140 03/29/2012 1120   K 3.8 02/28/2020 0530   K 3.7 03/29/2012 1120   CL 103 02/28/2020 0530   CL 110 (H) 03/29/2012 1120   CO2 25 02/28/2020 0530   CO2 20 (L) 03/29/2012 1120   GLUCOSE 109 (H) 02/28/2020 0530   GLUCOSE 70 03/29/2012 1120   BUN 15 02/28/2020 0530   BUN 17 03/29/2012 1120   CREATININE 0.80 04/25/2020 1106   CREATININE 0.74 03/29/2012 1120   CALCIUM 9.0 02/28/2020 0530   CALCIUM 8.9 03/29/2012 1120   PROT 6.9 02/27/2020 0852   PROT 7.5 03/29/2012 1120   ALBUMIN 4.2 02/27/2020 0852   ALBUMIN 4.1 03/29/2012 1120   AST 24 02/27/2020 0852   AST 25 03/29/2012 1120   ALT 25 02/27/2020 0852   ALT 29 03/29/2012 1120   ALKPHOS 80 02/27/2020 0852   ALKPHOS 124 03/29/2012 1120   BILITOT 0.9 02/27/2020 0852   BILITOT 0.3 03/29/2012 1120   GFRNONAA >60 02/28/2020 0530   GFRNONAA >60 03/29/2012 1120   GFRAA >60 01/14/2019 0106   GFRAA >60 03/29/2012 1120   Lab Results  Component Value Date   WBC 14.2 (H) 02/28/2020   HGB 13.3 02/28/2020   HCT 40.0 02/28/2020   MCV 95.0 02/28/2020   PLT 319 02/28/2020     Imaging Studies: CT Abdomen Pelvis W Contrast  Result Date: 04/25/2020 CLINICAL DATA:  abdominal pain none history of hiatal hernia. Chest pain, concerning for recurrent hiatal hernia. EXAM: CT ABDOMEN AND PELVIS WITH CONTRAST TECHNIQUE: Multidetector CT imaging of the abdomen and pelvis was performed using the standard protocol following bolus administration of intravenous contrast. CONTRAST:  157m OMNIPAQUE IOHEXOL 300 MG/ML  SOLN COMPARISON:  CT dated February 09, 2020 FINDINGS: Lower chest: The lung bases are clear. The heart size is normal. Hepatobiliary: The liver is normal. Status post cholecystectomy.There is no biliary ductal dilation. Pancreas: Normal contours without ductal dilatation. No peripancreatic fluid collection. Spleen: Unremarkable. Adrenals/Urinary Tract: --Adrenal glands: Unremarkable. --Right kidney/ureter: No hydronephrosis or radiopaque kidney stones. --Left kidney/ureter: No hydronephrosis or radiopaque kidney stones. --Urinary bladder: Unremarkable. Stomach/Bowel: --Stomach/Duodenum: There are postsurgical  changes related to prior hiatal hernia repair. There appears to be a small residual hiatal hernia. There is wall thickening of the distal esophagus. --Small bowel: Unremarkable. --Colon: Unremarkable. --Appendix: Normal. Vascular/Lymphatic: Atherosclerotic calcification is present within the non-aneurysmal abdominal aorta, without hemodynamically significant stenosis. --No retroperitoneal lymphadenopathy. --No mesenteric lymphadenopathy. --No pelvic or inguinal lymphadenopathy. Reproductive: Status post hysterectomy. No adnexal mass. Other: No ascites or free air. The abdominal wall is normal. Musculoskeletal. No acute displaced fractures. IMPRESSION: 1. There are postsurgical changes related to prior hiatal hernia repair. There appears to be a small residual hiatal hernia. There is wall thickening of the distal esophagus. This may related to the prior surgical intervention  versus esophagitis. 2. Status post cholecystectomy and hysterectomy. Aortic Atherosclerosis (ICD10-I70.0). Electronically Signed   By: Constance Holster M.D.   On: 04/25/2020 19:25    Assessment and Plan:   April Larsen is a 75 y.o. y/o female with hiatal hernia repair in November 2021, with previous history of ? UC (as documented by [facility], Dr. Percell Boston note), but not on any medications at 2018 colonoscopy pathology reporting unremarkable colonic mucosa with no chronic features reported  At this time, her upper GI symptoms have resolved She is reporting days of constipation followed by urgency with loose bowel movements  Given her urinary incontinence and above symptoms, patient likely has pelvic floor dysfunction.  She also reports history of vaginal deliveries, with tear, which could predispose her to pelvic floor muscle weakness as well  Pelvic floor physical therapy recommended, but patient does not want referral at this time  I have asked her to start taking Metamucil to help bulk stool  If symptoms not better in 2 to 3 weeks I have advised her to call us back  Would recommend colonoscopy to be scheduled the next visit in 2 to 3 months to follow-up with her history of questionable ulcerative colitis  Given that she is having constipation and not diarrhea on most days, I do not think that her urgency is to do with ulcerative colitis at this time, but rather due to pelvic floor muscle dysfunction.  We will plan on colonoscopy in about 3 months unless symptoms do not get better    Dr April Larsen

## 2020-05-21 DIAGNOSIS — R350 Frequency of micturition: Secondary | ICD-10-CM | POA: Diagnosis not present

## 2020-05-21 DIAGNOSIS — Z90710 Acquired absence of both cervix and uterus: Secondary | ICD-10-CM | POA: Diagnosis not present

## 2020-05-21 DIAGNOSIS — N939 Abnormal uterine and vaginal bleeding, unspecified: Secondary | ICD-10-CM | POA: Diagnosis not present

## 2020-06-05 DIAGNOSIS — R350 Frequency of micturition: Secondary | ICD-10-CM | POA: Diagnosis not present

## 2020-06-05 DIAGNOSIS — E782 Mixed hyperlipidemia: Secondary | ICD-10-CM | POA: Diagnosis not present

## 2020-06-12 DIAGNOSIS — F0390 Unspecified dementia without behavioral disturbance: Secondary | ICD-10-CM | POA: Insufficient documentation

## 2020-06-12 DIAGNOSIS — F330 Major depressive disorder, recurrent, mild: Secondary | ICD-10-CM | POA: Diagnosis not present

## 2020-06-12 DIAGNOSIS — F03A0 Unspecified dementia, mild, without behavioral disturbance, psychotic disturbance, mood disturbance, and anxiety: Secondary | ICD-10-CM | POA: Insufficient documentation

## 2020-06-12 DIAGNOSIS — E559 Vitamin D deficiency, unspecified: Secondary | ICD-10-CM | POA: Diagnosis not present

## 2020-06-12 DIAGNOSIS — K5190 Ulcerative colitis, unspecified, without complications: Secondary | ICD-10-CM | POA: Diagnosis not present

## 2020-06-12 DIAGNOSIS — E782 Mixed hyperlipidemia: Secondary | ICD-10-CM | POA: Diagnosis not present

## 2020-06-12 DIAGNOSIS — M4147 Neuromuscular scoliosis, lumbosacral region: Secondary | ICD-10-CM | POA: Diagnosis not present

## 2020-06-14 DIAGNOSIS — R350 Frequency of micturition: Secondary | ICD-10-CM | POA: Diagnosis not present

## 2020-06-14 DIAGNOSIS — R110 Nausea: Secondary | ICD-10-CM | POA: Diagnosis not present

## 2020-06-14 DIAGNOSIS — R300 Dysuria: Secondary | ICD-10-CM | POA: Diagnosis not present

## 2020-06-19 DIAGNOSIS — K581 Irritable bowel syndrome with constipation: Secondary | ICD-10-CM | POA: Diagnosis not present

## 2020-06-19 DIAGNOSIS — M5116 Intervertebral disc disorders with radiculopathy, lumbar region: Secondary | ICD-10-CM | POA: Diagnosis not present

## 2020-06-26 ENCOUNTER — Ambulatory Visit: Payer: Self-pay | Admitting: Surgery

## 2020-07-03 DIAGNOSIS — M5431 Sciatica, right side: Secondary | ICD-10-CM | POA: Diagnosis not present

## 2020-07-03 DIAGNOSIS — M9903 Segmental and somatic dysfunction of lumbar region: Secondary | ICD-10-CM | POA: Diagnosis not present

## 2020-07-03 DIAGNOSIS — M9902 Segmental and somatic dysfunction of thoracic region: Secondary | ICD-10-CM | POA: Diagnosis not present

## 2020-07-03 DIAGNOSIS — M9901 Segmental and somatic dysfunction of cervical region: Secondary | ICD-10-CM | POA: Diagnosis not present

## 2020-07-03 DIAGNOSIS — M546 Pain in thoracic spine: Secondary | ICD-10-CM | POA: Diagnosis not present

## 2020-07-05 DIAGNOSIS — M9901 Segmental and somatic dysfunction of cervical region: Secondary | ICD-10-CM | POA: Diagnosis not present

## 2020-07-05 DIAGNOSIS — M546 Pain in thoracic spine: Secondary | ICD-10-CM | POA: Diagnosis not present

## 2020-07-05 DIAGNOSIS — M9903 Segmental and somatic dysfunction of lumbar region: Secondary | ICD-10-CM | POA: Diagnosis not present

## 2020-07-05 DIAGNOSIS — M9902 Segmental and somatic dysfunction of thoracic region: Secondary | ICD-10-CM | POA: Diagnosis not present

## 2020-07-05 DIAGNOSIS — M5431 Sciatica, right side: Secondary | ICD-10-CM | POA: Diagnosis not present

## 2020-07-08 ENCOUNTER — Other Ambulatory Visit: Payer: Self-pay

## 2020-07-08 ENCOUNTER — Encounter: Payer: Self-pay | Admitting: Surgery

## 2020-07-08 ENCOUNTER — Ambulatory Visit: Payer: PPO | Admitting: Surgery

## 2020-07-08 VITALS — BP 145/95 | HR 97 | Temp 98.1°F | Ht 65.5 in | Wt 173.2 lb

## 2020-07-08 DIAGNOSIS — R140 Abdominal distension (gaseous): Secondary | ICD-10-CM | POA: Diagnosis not present

## 2020-07-08 DIAGNOSIS — R109 Unspecified abdominal pain: Secondary | ICD-10-CM

## 2020-07-08 DIAGNOSIS — M5431 Sciatica, right side: Secondary | ICD-10-CM | POA: Diagnosis not present

## 2020-07-08 DIAGNOSIS — M546 Pain in thoracic spine: Secondary | ICD-10-CM | POA: Diagnosis not present

## 2020-07-08 DIAGNOSIS — M9903 Segmental and somatic dysfunction of lumbar region: Secondary | ICD-10-CM | POA: Diagnosis not present

## 2020-07-08 DIAGNOSIS — M9901 Segmental and somatic dysfunction of cervical region: Secondary | ICD-10-CM | POA: Diagnosis not present

## 2020-07-08 DIAGNOSIS — M9902 Segmental and somatic dysfunction of thoracic region: Secondary | ICD-10-CM | POA: Diagnosis not present

## 2020-07-08 MED ORDER — SUCRALFATE 1 G PO TABS: 1.0000 g | ORAL_TABLET | Freq: Two times a day (BID) | ORAL | 3 refills | Status: DC

## 2020-07-08 NOTE — Patient Instructions (Addendum)
Stop taking Alka seltzer. You may take Tums.  We have sent a prescription for Carafate to help coat your stomach.   Try keeping a food diary.   Follow-up with our office as needed.  Please call and ask to speak with a nurse if you develop questions or concerns.

## 2020-07-09 NOTE — Progress Notes (Signed)
Outpatient Surgical Follow Up  07/09/2020  April Larsen is an 75 y.o. female.   Chief Complaint  Patient presents with  . Follow-up    Abd pain    HPI: April Larsen is a 75 year old female well-known to me with prior history of sigmoid colectomy for diverticulitis 4 years ago and more recently 6 months ago repair of paraesophageal hernia.  He does have chronic functional GI issues.  She alternates with diarrhea and constipation.  More recently she has been taking up to 8 Alka-Seltzer a day due to some indigestion.  She feels bloated.  Times that she feels some reflux for the most part she has noticed a significant difference after the paraesophageal hernia repair.  I did repeated imaging studies showing an intact wrap and intact repair. She did see Dr. Bonna Gains 2 months ago.  She did have some urinary incontinence and pelvic floor dysfunction and was asked to get pelvic floor training.  Was also recommended to have a colonoscopy due to functional GI issues.  Past Medical History:  Diagnosis Date  . Actinic keratosis   . Anemia    LAST HGB 15.2 ON 04-22-16  . Anxiety   . Arthritis   . Cancer ([place]) 2016   skin  . Cataract (lens) fragments in eye following cataract surgery, bilateral 2013   one eye done then 4 weeks later the other eye done.  . Chronic ulcerative colitis, without complications ([place]) 03/31/9415  . Diverticulitis   . Esophagitis, reflux 10/04/2014  . Family history of adverse reaction to anesthesia    SISTER HARD TO WAKE UP and nausea and vomiting.  . Female stress incontinence 08/09/2013  . Gastroesophageal reflux disease without esophagitis 08/06/2016  . GERD (gastroesophageal reflux disease)   . Headache    MIGRAINE  . History of hiatal hernia   . Incomplete emptying of bladder 10/29/2013  . Migraine 07/26/2013  . PONV (postoperative nausea and vomiting)    nausea, does not remember vomiting.  Marland Kitchen Postherpetic neuralgia    RIGHT 7TH.CRANIAL NERVE from shingles followed  by Bell's palsy  . Postmenopausal 11/09/2016  . PVC (premature ventricular contraction)   . Scoliosis   . Scoliosis (and kyphoscoliosis), idiopathic 07/26/2013  . Scoliosis of lumbosacral spine 01/18/2015  . Squamous cell carcinoma of skin 03/13/2013   L clavicle   . Squamous cell carcinoma of skin 03/10/2018   L med knee  . Squamous cell carcinoma of skin 02/21/2019   R cheek   . Squamous cell carcinoma of skin 06/01/2019   L mid lat pretibial - ED&C  . Trochanteric bursitis 11/29/2014  . Ulcerative colitis ([place])   . Urethral prolapse 08/09/2013  . Urge incontinence 08/09/2013    Past Surgical History:  Procedure Laterality Date  . ABDOMINAL HYSTERECTOMY    . BACK SURGERY     L3-L4 AND L3-S1 DECOMPRESSION  . CARPAL TUNNEL RELEASE    . CHOLECYSTECTOMY N/A 08/21/2016   Procedure: LAPAROSCOPIC CHOLECYSTECTOMY WITH INTRAOPERATIVE CHOLANGIOGRAM;  Surgeon: Leonie Green, MD;  Location: [facility] ORS;  Service: General;  Laterality: N/A;  . COLONOSCOPY  11/06/2005  . COLONOSCOPY WITH PROPOFOL N/A 07/29/2016   Procedure: COLONOSCOPY WITH PROPOFOL;  Surgeon: Manya Silvas, MD;  [facility] ENDOSCOPY;  Service: Endoscopy;  Laterality: N/A;  . ESOPHAGOGASTRODUODENOSCOPY  11/06/2005  . ESOPHAGOGASTRODUODENOSCOPY (EGD) WITH PROPOFOL N/A 07/29/2016   Procedure: ESOPHAGOGASTRODUODENOSCOPY (EGD) WITH PROPOFOL;  Surgeon: Manya Silvas, MD;  Location: [facility] ENDOSCOPY;  Service: Endoscopy;  Laterality: N/A;  . ESOPHAGOGASTRODUODENOSCOPY (EGD)  WITH PROPOFOL N/A 02/07/2020   Procedure: ESOPHAGOGASTRODUODENOSCOPY (EGD) WITH PROPOFOL;  Surgeon: Virgel Manifold, MD;  Location: [facility] ENDOSCOPY;  Service: Endoscopy;  Laterality: N/A;  . LAPAROSCOPIC LOW ANTERIOR RESECTION  11/24/2016   Procedure: LAPAROSCOPIC LOW ANTERIOR RESECTION;  Surgeon: Jules Husbands, MD;  Location: [facility] ORS;  Service: General;;  . XI ROBOTIC ASSISTED PARAESOPHAGEAL HERNIA REPAIR N/A 02/27/2020   Procedure: XI ROBOTIC ASSISTED  PARAESOPHAGEAL HERNIA REPAIR;  Surgeon: Jules Husbands, MD;  Location: [facility] ORS;  Service: General;  Laterality: N/A;    Family History  Problem Relation Age of Onset  . Breast cancer Sister 21  . Breast cancer Paternal Aunt 3  . Breast cancer Cousin        1st paternal cousin    Social History:  reports that she has never smoked. She has never used smokeless tobacco. She reports that she does not drink alcohol and does not use drugs.  Allergies:  Allergies  Allergen Reactions  . Nsaids Other (See Comments)    Patient has had part of her colon removed   . Sulfa Antibiotics Rash    Medications reviewed.    ROS Full ROS performed and is otherwise negative other than what is stated in HPI   BP (!) 145/95   Pulse 97   Temp 98.1 F (36.7 C) (Oral)   Ht 5' 5.5" (1.664 m)   Wt 173 lb 3.2 oz (78.6 kg)   SpO2 97%   BMI 28.38 kg/m   Physical Exam Vitals and nursing note reviewed. Exam conducted with a chaperone present.  Constitutional:      General: She is not in acute distress.    Appearance: Normal appearance. She is normal weight.  Cardiovascular:     Rate and Rhythm: Normal rate and regular rhythm.  Pulmonary:     Effort: Pulmonary effort is normal. No respiratory distress.     Breath sounds: Normal breath sounds. No stridor. No rhonchi.  Abdominal:     General: Abdomen is flat. There is no distension.     Palpations: Abdomen is soft. There is no mass.     Tenderness: There is no abdominal tenderness. There is no guarding or rebound.     Hernia: No hernia is present.     Comments: laparoscopic  Scars healed no evidence of hernias  Musculoskeletal:     Cervical back: Normal range of motion and neck supple. No rigidity or tenderness.  Lymphadenopathy:     Cervical: No cervical adenopathy.  Skin:    Capillary Refill: Capillary refill takes less than 2 seconds.  Neurological:     General: No focal deficit present.     Mental Status: She is alert and oriented to  person, place, and time.  Psychiatric:        Mood and Affect: Mood normal.        Behavior: Behavior normal.        Thought Content: Thought content normal.        Judgment: Judgment normal.      Assessment/Plan: 66-year-old female with chronic functional GI issues.  There is no evidence of significant issues related to her paraesophageal hernia repair.  Advised her against taking that many Alka-Seltzer's since that could lead to ulcer disease.  She seems to understand.  Given that concern someone go ahead and give her Carafate.   she does have a follow-up with GI and may need an upper scope as well.  From my standpoint to be no surgical  issues at this time. I spent a total of 32 minutes in this encounter to include reviewing medical records, and documented in the chart and counselINGthe patient.  Caroleen Hamman, MD [facility] General Surgeon

## 2020-07-10 DIAGNOSIS — M9902 Segmental and somatic dysfunction of thoracic region: Secondary | ICD-10-CM | POA: Diagnosis not present

## 2020-07-10 DIAGNOSIS — M546 Pain in thoracic spine: Secondary | ICD-10-CM | POA: Diagnosis not present

## 2020-07-10 DIAGNOSIS — M9903 Segmental and somatic dysfunction of lumbar region: Secondary | ICD-10-CM | POA: Diagnosis not present

## 2020-07-10 DIAGNOSIS — M9901 Segmental and somatic dysfunction of cervical region: Secondary | ICD-10-CM | POA: Diagnosis not present

## 2020-07-10 DIAGNOSIS — M5431 Sciatica, right side: Secondary | ICD-10-CM | POA: Diagnosis not present

## 2020-07-16 DIAGNOSIS — M546 Pain in thoracic spine: Secondary | ICD-10-CM | POA: Diagnosis not present

## 2020-07-16 DIAGNOSIS — M5431 Sciatica, right side: Secondary | ICD-10-CM | POA: Diagnosis not present

## 2020-07-16 DIAGNOSIS — M9901 Segmental and somatic dysfunction of cervical region: Secondary | ICD-10-CM | POA: Diagnosis not present

## 2020-07-16 DIAGNOSIS — M9902 Segmental and somatic dysfunction of thoracic region: Secondary | ICD-10-CM | POA: Diagnosis not present

## 2020-07-16 DIAGNOSIS — M9903 Segmental and somatic dysfunction of lumbar region: Secondary | ICD-10-CM | POA: Diagnosis not present

## 2020-07-17 DIAGNOSIS — M546 Pain in thoracic spine: Secondary | ICD-10-CM | POA: Diagnosis not present

## 2020-07-17 DIAGNOSIS — M5431 Sciatica, right side: Secondary | ICD-10-CM | POA: Diagnosis not present

## 2020-07-17 DIAGNOSIS — M9901 Segmental and somatic dysfunction of cervical region: Secondary | ICD-10-CM | POA: Diagnosis not present

## 2020-07-17 DIAGNOSIS — M9903 Segmental and somatic dysfunction of lumbar region: Secondary | ICD-10-CM | POA: Diagnosis not present

## 2020-07-17 DIAGNOSIS — M9902 Segmental and somatic dysfunction of thoracic region: Secondary | ICD-10-CM | POA: Diagnosis not present

## 2020-07-22 DIAGNOSIS — M9903 Segmental and somatic dysfunction of lumbar region: Secondary | ICD-10-CM | POA: Diagnosis not present

## 2020-07-22 DIAGNOSIS — M9902 Segmental and somatic dysfunction of thoracic region: Secondary | ICD-10-CM | POA: Diagnosis not present

## 2020-07-22 DIAGNOSIS — M5431 Sciatica, right side: Secondary | ICD-10-CM | POA: Diagnosis not present

## 2020-07-22 DIAGNOSIS — M546 Pain in thoracic spine: Secondary | ICD-10-CM | POA: Diagnosis not present

## 2020-07-22 DIAGNOSIS — M9901 Segmental and somatic dysfunction of cervical region: Secondary | ICD-10-CM | POA: Diagnosis not present

## 2020-07-24 ENCOUNTER — Ambulatory Visit: Payer: PPO | Admitting: Dermatology

## 2020-07-24 ENCOUNTER — Other Ambulatory Visit: Payer: Self-pay

## 2020-07-24 DIAGNOSIS — L820 Inflamed seborrheic keratosis: Secondary | ICD-10-CM | POA: Diagnosis not present

## 2020-07-24 DIAGNOSIS — L570 Actinic keratosis: Secondary | ICD-10-CM | POA: Diagnosis not present

## 2020-07-24 DIAGNOSIS — Z85828 Personal history of other malignant neoplasm of skin: Secondary | ICD-10-CM

## 2020-07-24 DIAGNOSIS — D692 Other nonthrombocytopenic purpura: Secondary | ICD-10-CM | POA: Diagnosis not present

## 2020-07-24 DIAGNOSIS — M5431 Sciatica, right side: Secondary | ICD-10-CM | POA: Diagnosis not present

## 2020-07-24 DIAGNOSIS — M546 Pain in thoracic spine: Secondary | ICD-10-CM | POA: Diagnosis not present

## 2020-07-24 DIAGNOSIS — M9902 Segmental and somatic dysfunction of thoracic region: Secondary | ICD-10-CM | POA: Diagnosis not present

## 2020-07-24 DIAGNOSIS — L821 Other seborrheic keratosis: Secondary | ICD-10-CM | POA: Diagnosis not present

## 2020-07-24 DIAGNOSIS — M9903 Segmental and somatic dysfunction of lumbar region: Secondary | ICD-10-CM | POA: Diagnosis not present

## 2020-07-24 DIAGNOSIS — M9901 Segmental and somatic dysfunction of cervical region: Secondary | ICD-10-CM | POA: Diagnosis not present

## 2020-07-24 MED ORDER — FLUOROURACIL 5 % EX CREA: TOPICAL_CREAM | Freq: Two times a day (BID) | CUTANEOUS | 1 refills | Status: DC

## 2020-07-24 NOTE — Patient Instructions (Signed)

## 2020-07-24 NOTE — Progress Notes (Signed)
Follow-Up Visit   Subjective  April Larsen is a 75 y.o. female who presents for the following: Actinic Keratosis (Follow up to right cheek, chest).  She has several other areas and spots to be checked.  The following portions of the chart were reviewed this encounter and updated as appropriate:   Tobacco  Allergies  Meds  Problems  Med Hx  Surg Hx  Fam Hx     Review of Systems:  No other skin or systemic complaints except as noted in HPI or Assessment and Plan.  Objective  Well appearing patient in no apparent distress; mood and affect are within normal limits.  A focused examination was performed including face, arms, legs. Relevant physical exam findings are noted in the Assessment and Plan.  Objective  Legs (6): Erythematous keratotic or waxy stuck-on papule or plaque.   Objective  Right cheek x 2 (2): Erythematous thin papules/macules with gritty scale.    Assessment & Plan    Seborrheic Keratoses - Stuck-on, waxy, tan-brown papules and/or plaques  - Benign-appearing - Discussed benign etiology and prognosis. - Observe - Call for any changes  Purpura - Chronic; persistent and recurrent.  Treatable, but not curable. - Violaceous macules and patches - Benign - Related to trauma, age, sun damage and/or use of blood thinners, chronic use of topical and/or oral steroids - Observe - Can use OTC arnica containing moisturizer such as Dermend Bruise Formula if desired - Call for worsening or other concerns  History of Basal Cell Carcinoma of the Skin - No evidence of recurrence today - Recommend regular full body skin exams - Recommend daily broad spectrum sunscreen SPF 30+ to sun-exposed areas, reapply every 2 hours as needed.  - Call if any new or changing lesions are noted between office visits  Inflamed seborrheic keratosis (6) Legs  Destruction of lesion - Legs Complexity: simple   Destruction method: cryotherapy   Informed consent: discussed and  consent obtained   Timeout:  patient name, date of birth, surgical site, and procedure verified Lesion destroyed using liquid nitrogen: Yes   Region frozen until ice ball extended beyond lesion: Yes   Outcome: patient tolerated procedure well with no complications   Post-procedure details: wound care instructions given    AK (actinic keratosis) (2) Right cheek x 2  Actinic Damage - Severe, confluent actinic changes with pre-cancerous actinic keratoses  - Severe, chronic, not at goal, secondary to cumulative UV radiation exposure over time - diffuse scaly erythematous macules and papules with underlying dyspigmentation - Discussed Prescription "Field Treatment" for Severe, Chronic Confluent Actinic Changes with Pre-Cancerous Actinic Keratoses Field treatment involves treatment of an entire area of skin that has confluent Actinic Changes (Sun/ Ultraviolet light damage) and PreCancerous Actinic Keratoses by method of PhotoDynamic Therapy (PDT) and/or prescription Topical Chemotherapy agents such as 5-fluorouracil, 5-fluorouracil/calcipotriene, and/or imiquimod.  The purpose is to decrease the number of clinically evident and subclinical PreCancerous lesions to prevent progression to development of skin cancer by chemically destroying early precancer changes that may or may not be visible.  It has been shown to reduce the risk of developing skin cancer in the treated area. As a result of treatment, redness, scaling, crusting, and open sores may occur during treatment course. One or more than one of these methods may be used and may have to be used several times to control, suppress and eliminate the PreCancerous changes. Discussed treatment course, expected reaction, and possible side effects. - Recommend daily broad spectrum sunscreen  SPF 30+ to sun-exposed areas, reapply every 2 hours as needed.  - Staying in the shade or wearing long sleeves, sun glasses (UVA+UVB protection) and wide brim hats (4-inch  brim around the entire circumference of the hat) are also recommended. - Call for new or changing lesions.    Start Fluorouracil 5% mixed with Calcipotriene 0.005% cream bid x 7 days to face - sent to [place] of lesion - Right cheek x 2 Complexity: simple   Destruction method: cryotherapy   Informed consent: discussed and consent obtained   Timeout:  patient name, date of birth, surgical site, and procedure verified Lesion destroyed using liquid nitrogen: Yes   Region frozen until ice ball extended beyond lesion: Yes   Outcome: patient tolerated procedure well with no complications   Post-procedure details: wound care instructions given    fluorouracil (EFUDEX) 5 % cream - Right cheek x 2  Return in about 3 months (around 10/23/2020) for AK follow up.  I, Ashok Cordia, CMA, am acting as scribe for Sarina Ser, MD .  Documentation: I have reviewed the above documentation for accuracy and completeness, and I agree with the above.  Sarina Ser, MD

## 2020-07-26 DIAGNOSIS — M9901 Segmental and somatic dysfunction of cervical region: Secondary | ICD-10-CM | POA: Diagnosis not present

## 2020-07-26 DIAGNOSIS — M9903 Segmental and somatic dysfunction of lumbar region: Secondary | ICD-10-CM | POA: Diagnosis not present

## 2020-07-26 DIAGNOSIS — M5431 Sciatica, right side: Secondary | ICD-10-CM | POA: Diagnosis not present

## 2020-07-26 DIAGNOSIS — M9902 Segmental and somatic dysfunction of thoracic region: Secondary | ICD-10-CM | POA: Diagnosis not present

## 2020-07-28 ENCOUNTER — Encounter: Payer: Self-pay | Admitting: Dermatology

## 2020-07-29 DIAGNOSIS — M9903 Segmental and somatic dysfunction of lumbar region: Secondary | ICD-10-CM | POA: Diagnosis not present

## 2020-07-29 DIAGNOSIS — M9901 Segmental and somatic dysfunction of cervical region: Secondary | ICD-10-CM | POA: Diagnosis not present

## 2020-07-29 DIAGNOSIS — M5431 Sciatica, right side: Secondary | ICD-10-CM | POA: Diagnosis not present

## 2020-07-29 DIAGNOSIS — M9902 Segmental and somatic dysfunction of thoracic region: Secondary | ICD-10-CM | POA: Diagnosis not present

## 2020-07-31 DIAGNOSIS — M9902 Segmental and somatic dysfunction of thoracic region: Secondary | ICD-10-CM | POA: Diagnosis not present

## 2020-07-31 DIAGNOSIS — M9903 Segmental and somatic dysfunction of lumbar region: Secondary | ICD-10-CM | POA: Diagnosis not present

## 2020-07-31 DIAGNOSIS — M9901 Segmental and somatic dysfunction of cervical region: Secondary | ICD-10-CM | POA: Diagnosis not present

## 2020-07-31 DIAGNOSIS — M5431 Sciatica, right side: Secondary | ICD-10-CM | POA: Diagnosis not present

## 2020-08-02 DIAGNOSIS — R600 Localized edema: Secondary | ICD-10-CM | POA: Diagnosis not present

## 2020-08-02 DIAGNOSIS — M9902 Segmental and somatic dysfunction of thoracic region: Secondary | ICD-10-CM | POA: Diagnosis not present

## 2020-08-02 DIAGNOSIS — M5431 Sciatica, right side: Secondary | ICD-10-CM | POA: Diagnosis not present

## 2020-08-02 DIAGNOSIS — I872 Venous insufficiency (chronic) (peripheral): Secondary | ICD-10-CM | POA: Diagnosis not present

## 2020-08-02 DIAGNOSIS — M9901 Segmental and somatic dysfunction of cervical region: Secondary | ICD-10-CM | POA: Diagnosis not present

## 2020-08-02 DIAGNOSIS — M9903 Segmental and somatic dysfunction of lumbar region: Secondary | ICD-10-CM | POA: Diagnosis not present

## 2020-08-05 DIAGNOSIS — Z1211 Encounter for screening for malignant neoplasm of colon: Secondary | ICD-10-CM | POA: Diagnosis not present

## 2020-08-05 DIAGNOSIS — M5431 Sciatica, right side: Secondary | ICD-10-CM | POA: Diagnosis not present

## 2020-08-05 DIAGNOSIS — M9901 Segmental and somatic dysfunction of cervical region: Secondary | ICD-10-CM | POA: Diagnosis not present

## 2020-08-05 DIAGNOSIS — M9903 Segmental and somatic dysfunction of lumbar region: Secondary | ICD-10-CM | POA: Diagnosis not present

## 2020-08-05 DIAGNOSIS — M9902 Segmental and somatic dysfunction of thoracic region: Secondary | ICD-10-CM | POA: Diagnosis not present

## 2020-08-05 DIAGNOSIS — N939 Abnormal uterine and vaginal bleeding, unspecified: Secondary | ICD-10-CM | POA: Diagnosis not present

## 2020-08-07 ENCOUNTER — Ambulatory Visit: Payer: PPO | Admitting: Gastroenterology

## 2020-08-07 ENCOUNTER — Other Ambulatory Visit: Payer: Self-pay

## 2020-08-07 ENCOUNTER — Encounter: Payer: Self-pay | Admitting: Gastroenterology

## 2020-08-07 VITALS — BP 137/82 | HR 96 | Temp 98.2°F | Ht 65.5 in | Wt 173.0 lb

## 2020-08-07 DIAGNOSIS — K5900 Constipation, unspecified: Secondary | ICD-10-CM | POA: Diagnosis not present

## 2020-08-07 DIAGNOSIS — Z8719 Personal history of other diseases of the digestive system: Secondary | ICD-10-CM | POA: Diagnosis not present

## 2020-08-07 DIAGNOSIS — M9903 Segmental and somatic dysfunction of lumbar region: Secondary | ICD-10-CM | POA: Diagnosis not present

## 2020-08-07 DIAGNOSIS — M9901 Segmental and somatic dysfunction of cervical region: Secondary | ICD-10-CM | POA: Diagnosis not present

## 2020-08-07 DIAGNOSIS — M5431 Sciatica, right side: Secondary | ICD-10-CM | POA: Diagnosis not present

## 2020-08-07 DIAGNOSIS — M9902 Segmental and somatic dysfunction of thoracic region: Secondary | ICD-10-CM | POA: Diagnosis not present

## 2020-08-07 MED ORDER — NA SULFATE-K SULFATE-MG SULF 17.5-3.13-1.6 GM/177ML PO SOLN: ORAL | 0 refills | Status: DC

## 2020-08-07 NOTE — Progress Notes (Signed)
[name], MD [address]  [place]  [place], [place] [address]  Main: [phone]  Fax: [phone]   Primary Care Physician: Rusty Aus, MD   CC: bowel movements  HPI: April Larsen is a 75 y.o. female who reports constipation and not going for 3-4 days and has tried several OTC meds including miralax, metamucil, but with partial results. States recently noted that she had bleeding and did not know the source and had Ob/Gyn exam and they did not think it was vaginal. But she was diagnosed with a UTI and has completed treatment.   She was previously seen by [facility] clinic GI and previous history of UC as documented but patient has not been on any medications recently and is not having any diarrhea.  Previous history: She was previously followed by [facility] clinic GI, Dr. Vira Agar, and has a history of ulcerative colitis.  However, she has not been on any medications for years.  Over the years, patient has had some urinary  incontinence, and reports urgency with her bowel movements.  However, she also reports constipation on some days, followed by days where she has a loose bowel movement.  No blood in stool.  No weight loss.  No abdominal pain.  No nausea or vomiting.  Previous history: Patient has a history of chronic ulcerative colitis documented in her chart.  On further review of her previous documentation, she was seen by [facility] clinic GI for this but the history is somewhat unclear.  She has not been on any UC meds for years.I am able to see a pathology report from 2013 in her chart that shows mild chronic inactive colitis in all the biopsy specimens.  Procedure report not available.  She had a colonoscopy in 2018 that was normal with biopsies showing unremarkable colonic mucosa throughout.  She also had laparoscopic sigmoid resection for diverticulitis in 2018.  As per February 2018 note by Denice Paradise, Dr. Percell Boston PA, "Patient was hospitalized for  C-diff colitis in 2010. Her colonoscopy 02/08/09 showing diverticulosis in the sigmoid colon, descending colon, transverse colon, internal hemorrhoids, and colon otherwise appeared normal. Biopsies of the ileocecal valve showed mild active colitis with architectural features of chronicity.The patient was having a lot of diarrhea at the time, quite symptomatic, with elevated ESR/CRP and Dr. Vira Agar felt the colonoscopy was not that impressive. She was given Entocort."  Assessment plan on that same note advises to stop the Entocort.  ?So essentially patient was on Entocort without a clear diagnosis from 2010 to 2018  Current Outpatient Medications  Medication Sig Dispense Refill  . acetaminophen (TYLENOL) 500 MG tablet Take 1,000 mg by mouth every 6 (six) hours as needed for moderate pain or headache.     . B Complex-C (B-COMPLEX WITH VITAMIN C) tablet Take 1 tablet by mouth daily.    Marland Kitchen buPROPion (WELLBUTRIN XL) 150 MG 24 hr tablet Take 150 mg by mouth daily.    . Calcium Carbonate-Vitamin D (CALCIUM 600+D PO) Take 1 tablet by mouth 2 (two) times daily.    . Cholecalciferol (VITAMIN D3) 50 MCG (2000 UT) TABS Take 2,000 Units by mouth daily.    . DULoxetine (CYMBALTA) 60 MG capsule Take 60 mg by mouth at bedtime.   3  . fluorouracil (EFUDEX) 5 % cream Apply topically 2 (two) times daily. X 7 days 15 g 1  . omeprazole (PRILOSEC) 20 MG capsule Take 20 mg by mouth 2 (two) times daily.  3  .  ondansetron (ZOFRAN ODT) 4 MG disintegrating tablet Take 1 tablet (4 mg total) by mouth every 8 (eight) hours as needed for nausea or vomiting. 20 tablet 0  . sucralfate (CARAFATE) 1 g tablet Take 1 tablet (1 g total) by mouth 2 (two) times daily. 60 tablet 3  . traMADol (ULTRAM) 50 MG tablet Take 100 mg by mouth 2 (two) times daily.     . cefUROXime (CEFTIN) 250 MG tablet Take 250 mg by mouth 2 (two) times daily. (Patient not taking: Reported on 08/07/2020)    . dicyclomine (BENTYL) 20 MG tablet Take by mouth. (Patient  not taking: Reported on 08/07/2020)    . Na Sulfate-K Sulfate-Mg Sulf 17.5-3.13-1.6 GM/177ML SOLN At 5 PM the day before procedure take 1 bottle and 5 hours before procedure take 1 bottle. 354 mL 0   No current facility-administered medications for this visit.    Allergies as of 08/07/2020 - Review Complete 08/07/2020  Allergen Reaction Noted  . Nsaids Other (See Comments) 01/05/2020  . Sulfa antibiotics Rash 08/24/2015    ROS:  General: Negative for anorexia, weight loss, fever, chills, fatigue, weakness. ENT: Negative for hoarseness, difficulty swallowing , nasal congestion. CV: Negative for chest pain, angina, palpitations, dyspnea on exertion, peripheral edema.  Respiratory: Negative for dyspnea at rest, dyspnea on exertion, cough, sputum, wheezing.  GI: See history of present illness. GU:  Negative for dysuria, hematuria, urinary incontinence, urinary frequency, nocturnal urination.  Endo: Negative for unusual weight change.    Physical Examination:   BP 137/82   Pulse 96   Temp 98.2 F (36.8 C) (Oral)   Ht 5' 5.5" (1.664 m)   Wt 173 lb (78.5 kg)   BMI 28.35 kg/m   General: Well-nourished, well-developed in no acute distress.  Eyes: No icterus. Conjunctivae pink. Mouth: Oropharyngeal mucosa moist and pink , no lesions erythema or exudate. Neck: Supple, Trachea midline Abdomen: Bowel sounds are normal, nontender, nondistended, no hepatosplenomegaly or masses, no abdominal bruits or hernia , no rebound or guarding.   Extremities: No lower extremity edema. No clubbing or deformities. Neuro: Alert and oriented x 3.  Grossly intact. Skin: Warm and dry, no jaundice.   Psych: Alert and cooperative, normal mood and affect.   Labs: CMP     Component Value Date/Time   NA 137 02/28/2020 0530   NA 140 03/29/2012 1120   K 3.8 02/28/2020 0530   K 3.7 03/29/2012 1120   CL 103 02/28/2020 0530   CL 110 (H) 03/29/2012 1120   CO2 25 02/28/2020 0530   CO2 20 (L) 03/29/2012 1120    GLUCOSE 109 (H) 02/28/2020 0530   GLUCOSE 70 03/29/2012 1120   BUN 15 02/28/2020 0530   BUN 17 03/29/2012 1120   CREATININE 0.80 04/25/2020 1106   CREATININE 0.74 03/29/2012 1120   CALCIUM 9.0 02/28/2020 0530   CALCIUM 8.9 03/29/2012 1120   PROT 6.9 02/27/2020 0852   PROT 7.5 03/29/2012 1120   ALBUMIN 4.2 02/27/2020 0852   ALBUMIN 4.1 03/29/2012 1120   AST 24 02/27/2020 0852   AST 25 03/29/2012 1120   ALT 25 02/27/2020 0852   ALT 29 03/29/2012 1120   ALKPHOS 80 02/27/2020 0852   ALKPHOS 124 03/29/2012 1120   BILITOT 0.9 02/27/2020 0852   BILITOT 0.3 03/29/2012 1120   GFRNONAA >60 02/28/2020 0530   GFRNONAA >60 03/29/2012 1120   GFRAA >60 01/14/2019 0106   GFRAA >60 03/29/2012 1120   Lab Results  Component Value Date   WBC  14.2 (H) 02/28/2020   HGB 13.3 02/28/2020   HCT 40.0 02/28/2020   MCV 95.0 02/28/2020   PLT 319 02/28/2020    Imaging Studies: No results found.  Assessment and Plan:   April Larsen is a 75 y.o. y/o female with questionable history of UC documented by [facility] clinic GI that she was seeing previously, but not on UC meds, with constipation, recent hiatal hernia repair, here for follow-up  Patient states she recently saw blood in her underwear but was unsure if it came from her vagina.  OB/GYN did not think this was vaginal bleeding.  She had a UTI and was treated with antibiotics, she is questioning if it came from her bowels.  She is not having any diarrhea and is constipated instead.  Is taking Metamucil daily.  On last visit we did discuss a colonoscopy given her questionable history of ulcerative colitis and patient is willing to proceed with this.  I have discussed alternative options, risks & benefits,  which include, but are not limited to, bleeding, infection, perforation,respiratory complication & drug reaction.  The patient agrees with this plan & written consent will be obtained.     Dr [name]

## 2020-08-08 ENCOUNTER — Telehealth: Payer: Self-pay | Admitting: Gastroenterology

## 2020-08-08 NOTE — Telephone Encounter (Signed)
Patient called LVM that her insurance is not going to cover her prep.  She asks for another alternative, please call to advise.

## 2020-08-09 DIAGNOSIS — M9903 Segmental and somatic dysfunction of lumbar region: Secondary | ICD-10-CM | POA: Diagnosis not present

## 2020-08-09 DIAGNOSIS — M9901 Segmental and somatic dysfunction of cervical region: Secondary | ICD-10-CM | POA: Diagnosis not present

## 2020-08-09 DIAGNOSIS — M9902 Segmental and somatic dysfunction of thoracic region: Secondary | ICD-10-CM | POA: Diagnosis not present

## 2020-08-09 DIAGNOSIS — M5431 Sciatica, right side: Secondary | ICD-10-CM | POA: Diagnosis not present

## 2020-08-09 DIAGNOSIS — Z1211 Encounter for screening for malignant neoplasm of colon: Secondary | ICD-10-CM | POA: Diagnosis not present

## 2020-08-13 ENCOUNTER — Other Ambulatory Visit: Payer: Self-pay | Admitting: *Deleted

## 2020-08-13 DIAGNOSIS — K5190 Ulcerative colitis, unspecified, without complications: Secondary | ICD-10-CM

## 2020-08-13 MED ORDER — PEG 3350-KCL-NA BICARB-NACL 420 G PO SOLR: 4000.0000 mL | Freq: Once | ORAL | 0 refills | Status: AC

## 2020-08-13 NOTE — Telephone Encounter (Signed)
New rx for bowel prep sent to pharmacy, instructions sent via MyChart. Attempted to contact patient. No answer LVM

## 2020-08-14 DIAGNOSIS — M5431 Sciatica, right side: Secondary | ICD-10-CM | POA: Diagnosis not present

## 2020-08-14 DIAGNOSIS — M9902 Segmental and somatic dysfunction of thoracic region: Secondary | ICD-10-CM | POA: Diagnosis not present

## 2020-08-14 DIAGNOSIS — M9903 Segmental and somatic dysfunction of lumbar region: Secondary | ICD-10-CM | POA: Diagnosis not present

## 2020-08-14 DIAGNOSIS — M9901 Segmental and somatic dysfunction of cervical region: Secondary | ICD-10-CM | POA: Diagnosis not present

## 2020-08-20 ENCOUNTER — Telehealth: Payer: Self-pay | Admitting: Gastroenterology

## 2020-08-20 NOTE — Telephone Encounter (Signed)
Patient called LVM wants to know if it is ok to take her fluid pill.  She states that her feet and legs are swelling and that she had stopped taking the fluid pill due to upcoming procedure sch on 08/22/20.  Please call to advise

## 2020-08-20 NOTE — Telephone Encounter (Signed)
Called patient to let her know that it is okay for her to take her Furosemide 20 MG tablet today and tomorrow since she is having some swelling on her legs. Patient then had no further questions.

## 2020-08-21 ENCOUNTER — Encounter: Payer: Self-pay | Admitting: Gastroenterology

## 2020-08-22 ENCOUNTER — Encounter: Payer: Self-pay | Admitting: Gastroenterology

## 2020-08-22 ENCOUNTER — Ambulatory Visit
Admission: RE | Admit: 2020-08-22 | Discharge: 2020-08-22 | Disposition: A | Discharge status: Home / Self Care | Payer: PPO | Attending: Gastroenterology | Admitting: Gastroenterology

## 2020-08-22 ENCOUNTER — Encounter
Admission: RE | Disposition: A | Discharge status: Home / Self Care | Payer: Self-pay | Source: Home / Self Care | Attending: Gastroenterology

## 2020-08-22 ENCOUNTER — Ambulatory Visit: Payer: PPO | Admitting: Anesthesiology

## 2020-08-22 DIAGNOSIS — K639 Disease of intestine, unspecified: Secondary | ICD-10-CM | POA: Diagnosis not present

## 2020-08-22 DIAGNOSIS — K6289 Other specified diseases of anus and rectum: Secondary | ICD-10-CM | POA: Diagnosis not present

## 2020-08-22 DIAGNOSIS — Z882 Allergy status to sulfonamides status: Secondary | ICD-10-CM | POA: Diagnosis not present

## 2020-08-22 DIAGNOSIS — Z886 Allergy status to analgesic agent status: Secondary | ICD-10-CM | POA: Insufficient documentation

## 2020-08-22 DIAGNOSIS — K648 Other hemorrhoids: Secondary | ICD-10-CM | POA: Diagnosis not present

## 2020-08-22 DIAGNOSIS — Z980 Intestinal bypass and anastomosis status: Secondary | ICD-10-CM | POA: Diagnosis not present

## 2020-08-22 DIAGNOSIS — Z8719 Personal history of other diseases of the digestive system: Secondary | ICD-10-CM

## 2020-08-22 DIAGNOSIS — K5190 Ulcerative colitis, unspecified, without complications: Secondary | ICD-10-CM | POA: Diagnosis not present

## 2020-08-22 DIAGNOSIS — Z1211 Encounter for screening for malignant neoplasm of colon: Secondary | ICD-10-CM | POA: Insufficient documentation

## 2020-08-22 DIAGNOSIS — Z79891 Long term (current) use of opiate analgesic: Secondary | ICD-10-CM | POA: Diagnosis not present

## 2020-08-22 DIAGNOSIS — L539 Erythematous condition, unspecified: Secondary | ICD-10-CM | POA: Diagnosis not present

## 2020-08-22 DIAGNOSIS — Z85828 Personal history of other malignant neoplasm of skin: Secondary | ICD-10-CM | POA: Insufficient documentation

## 2020-08-22 DIAGNOSIS — K219 Gastro-esophageal reflux disease without esophagitis: Secondary | ICD-10-CM | POA: Diagnosis not present

## 2020-08-22 DIAGNOSIS — K5730 Diverticulosis of large intestine without perforation or abscess without bleeding: Secondary | ICD-10-CM | POA: Diagnosis not present

## 2020-08-22 DIAGNOSIS — Z79899 Other long term (current) drug therapy: Secondary | ICD-10-CM | POA: Insufficient documentation

## 2020-08-22 HISTORY — PX: COLONOSCOPY WITH PROPOFOL: SHX5780

## 2020-08-22 SURGERY — COLONOSCOPY WITH PROPOFOL
Anesthesia: General

## 2020-08-22 MED ORDER — SODIUM CHLORIDE 0.9 % IV SOLN: INTRAVENOUS | Status: DC

## 2020-08-22 MED ORDER — PROPOFOL 10 MG/ML IV BOLUS
INTRAVENOUS | Status: DC | PRN
  Administered 2020-08-22: 30 mg via INTRAVENOUS
  Administered 2020-08-22: 70 mg via INTRAVENOUS

## 2020-08-22 MED ORDER — LIDOCAINE 2% (20 MG/ML) 5 ML SYRINGE
INTRAMUSCULAR | Status: DC | PRN
  Administered 2020-08-22: 25 mg via INTRAVENOUS

## 2020-08-22 MED ORDER — HYDROCORTISONE (PERIANAL) 2.5 % EX CREA: 1.0000 "application " | TOPICAL_CREAM | Freq: Two times a day (BID) | CUTANEOUS | 0 refills | Status: AC

## 2020-08-22 MED ORDER — PROPOFOL 500 MG/50ML IV EMUL
INTRAVENOUS | Status: DC | PRN
  Administered 2020-08-22: 120 ug/kg/min via INTRAVENOUS

## 2020-08-22 NOTE — Op Note (Signed)
[facility] Gastroenterology Patient Name: April Larsen Procedure Date: 08/22/2020 8:58 AM MRN: [id number] Account #: [id number] Date of Birth: 06/28/1945 Admit Type: Outpatient Age: 75 Room: [facility] ENDO ROOM 2 Gender: Female Note Status: Finalized Procedure:             Colonoscopy Indications:           Screening for colorectal malignant neoplasm Providers:             Raelan Burgoon B. Bonna Gains MD, MD Referring MD:          Rusty Aus, MD (Referring MD) Medicines:             Monitored Anesthesia Care Complications:         No immediate complications. Procedure:             Pre-Anesthesia Assessment:                        - Prior to the procedure, a History and Physical was                         performed, and patient medications, allergies and                         sensitivities were reviewed. The patient's tolerance                         of previous anesthesia was reviewed.                        - The risks and benefits of the procedure and the                         sedation options and risks were discussed with the                         patient. All questions were answered and informed                         consent was obtained.                        - Patient identification and proposed procedure were                         verified prior to the procedure by the physician, the                         nurse, the anesthetist and the technician. The                         procedure was verified in the pre-procedure area in                         the procedure room in the endoscopy suite.                        - ASA Grade Assessment: II - A patient with mild  systemic disease.                        - After reviewing the risks and benefits, the patient                         was deemed in satisfactory condition to undergo the                         procedure.                        After obtaining informed consent, the  colonoscope was                         passed under direct vision. Throughout the procedure,                         the patient's blood pressure, pulse, and oxygen                         saturations were monitored continuously. The                         Colonoscope was introduced through the anus and                         advanced to the the terminal ileum. The colonoscopy                         was performed with ease. The patient tolerated the                         procedure well. The quality of the bowel preparation                         was good except the ascending colon was fair and the                         cecum was fair. Solid pieces of vegetable matter and                         stool in the right colon could not be completely                         cleared due to clogging of the suction channel. Findings:      The perianal and digital rectal examinations were normal.      A patchy area of mildly erythematous mucosa was found in the ascending       colon and in the cecum. Biopsies were taken with a cold forceps for       histology.      A single diverticulum was found in the sigmoid colon.      There was evidence of a prior surgical anastomosis in the sigmoid colon.       This was patent and was characterized by erythema. Mild erythema was       seen with no ulceration or friability present.      The exam was otherwise without  abnormality.      The rectum, sigmoid colon, descending colon, transverse colon and ileum       appeared normal. Biopsies were taken with a cold forceps for histology.      Non-bleeding internal hemorrhoids were found during retroflexion. The       hemorrhoids were large. Impression:            - Erythematous mucosa in the ascending colon and in                         the cecum. Biopsied.                        - Diverticulosis in the sigmoid colon.                        - Patent surgical anastomosis, characterized by                          erythema.                        - The examination was otherwise normal.                        - The rectum, sigmoid colon, descending colon,                         transverse colon and terminal ileum are normal.                         Biopsied.                        - Non-bleeding internal hemorrhoids. Recommendation:        - Discharge patient to home.                        - Hydrocortisone topical treatment has been sent to                         pharmacy due to patient's large internal hemorrhoids                         and previous episode of BRBPR that likely occurred                         from this. If symptoms re-occur considering in-office                         banding.                        - Resume previous diet.                        - Continue present medications.                        - Repeat colonoscopy date to be determined after  pending pathology results are reviewed.                        - Return to primary care physician as previously                         scheduled.                        - The findings and recommendations were discussed with                         the patient.                        - The findings and recommendations were discussed with                         the patient's family.                        - High fiber diet. Procedure Code(s):     --- Professional ---                        [phone], Colonoscopy, flexible; with biopsy, single or                         multiple Diagnosis Code(s):     --- Professional ---                        Z12.11, Encounter for screening for malignant neoplasm                         of colon                        K63.89, Other specified diseases of intestine CPT copyright 2019 American Medical Association. All rights reserved. The codes documented in this report are preliminary and upon coder review may  be revised to meet current compliance requirements.  [name], MD Margretta Sidle B. Bonna Gains MD, MD 08/22/2020 9:43:49 AM This report has been signed electronically. Number of Addenda: 0 Note Initiated On: 08/22/2020 8:58 AM Scope Withdrawal Time: 0 hours 18 minutes 50 seconds  Total Procedure Duration: 0 hours 25 minutes 6 seconds  Estimated Blood Loss:  Estimated blood loss: none.      [facility]

## 2020-08-22 NOTE — H&P (Signed)
April Antigua, MD [address] [place], [place], [place], [address] [address], [place], [place], [place], [address] Phone: [phone]  Fax: [phone]  Primary Care Physician:  Rusty Aus, MD   Pre-Procedure History & Physical: HPI:  April Larsen is a 75 y.o. female is here for a colonoscopy.   Past Medical History:  Diagnosis Date  . Actinic keratosis   . Anemia    LAST HGB 15.2 ON 04-22-16  . Anxiety   . Arthritis   . Cancer ([place]) 2016   skin  . Cataract (lens) fragments in eye following cataract surgery, bilateral 2013   one eye done then 4 weeks later the other eye done.  . Chronic ulcerative colitis, without complications ([place]) 10/06/8919  . Diverticulitis   . Esophagitis, reflux 10/04/2014  . Family history of adverse reaction to anesthesia    SISTER HARD TO WAKE UP and nausea and vomiting.  . Female stress incontinence 08/09/2013  . Gastroesophageal reflux disease without esophagitis 08/06/2016  . GERD (gastroesophageal reflux disease)   . Headache    MIGRAINE  . History of hiatal hernia   . Incomplete emptying of bladder 10/29/2013  . Migraine 07/26/2013  . PONV (postoperative nausea and vomiting)    nausea, does not remember vomiting.  Marland Kitchen Postherpetic neuralgia    RIGHT 7TH.CRANIAL NERVE from shingles followed by Bell's palsy  . Postmenopausal 11/09/2016  . PVC (premature ventricular contraction)   . Scoliosis   . Scoliosis (and kyphoscoliosis), idiopathic 07/26/2013  . Scoliosis of lumbosacral spine 01/18/2015  . Squamous cell carcinoma of skin 03/13/2013   L clavicle   . Squamous cell carcinoma of skin 03/10/2018   L med knee  . Squamous cell carcinoma of skin 02/21/2019   R cheek   . Squamous cell carcinoma of skin 06/01/2019   L mid lat pretibial - ED&C  . Trochanteric bursitis 11/29/2014  . Ulcerative colitis ([place])   . Urethral prolapse 08/09/2013  . Urge incontinence 08/09/2013    Past Surgical History:  Procedure Laterality Date  .  ABDOMINAL HYSTERECTOMY    . BACK SURGERY     L3-L4 AND L3-S1 DECOMPRESSION  . CARPAL TUNNEL RELEASE    . CHOLECYSTECTOMY N/A 08/21/2016   Procedure: LAPAROSCOPIC CHOLECYSTECTOMY WITH INTRAOPERATIVE CHOLANGIOGRAM;  Surgeon: Leonie Green, MD;  Location: [facility] ORS;  Service: General;  Laterality: N/A;  . COLON SURGERY    . COLONOSCOPY  11/06/2005  . COLONOSCOPY WITH PROPOFOL N/A 07/29/2016   Procedure: COLONOSCOPY WITH PROPOFOL;  Surgeon: Manya Silvas, MD;  [facility] ENDOSCOPY;  Service: Endoscopy;  Laterality: N/A;  . ESOPHAGOGASTRODUODENOSCOPY  11/06/2005  . ESOPHAGOGASTRODUODENOSCOPY (EGD) WITH PROPOFOL N/A 07/29/2016   Procedure: ESOPHAGOGASTRODUODENOSCOPY (EGD) WITH PROPOFOL;  Surgeon: Manya Silvas, MD;  Location: [facility] ENDOSCOPY;  Service: Endoscopy;  Laterality: N/A;  . ESOPHAGOGASTRODUODENOSCOPY (EGD) WITH PROPOFOL N/A 02/07/2020   Procedure: ESOPHAGOGASTRODUODENOSCOPY (EGD) WITH PROPOFOL;  Surgeon: Virgel Manifold, MD;  Location: [facility] ENDOSCOPY;  Service: Endoscopy;  Laterality: N/A;  . LAPAROSCOPIC LOW ANTERIOR RESECTION  11/24/2016   Procedure: LAPAROSCOPIC LOW ANTERIOR RESECTION;  Surgeon: Jules Husbands, MD;  Location: [facility] ORS;  Service: General;;  . XI ROBOTIC ASSISTED PARAESOPHAGEAL HERNIA REPAIR N/A 02/27/2020   Procedure: XI ROBOTIC ASSISTED PARAESOPHAGEAL HERNIA REPAIR;  Surgeon: Jules Husbands, MD;  Location: [facility] ORS;  Service: General;  Laterality: N/A;    Prior to Admission medications   Medication Sig Start Date End Date Taking? Authorizing Provider  buPROPion (WELLBUTRIN XL) 150  MG 24 hr tablet Take 150 mg by mouth daily. 12/08/18 08/22/20 Yes [provider]  DULoxetine (CYMBALTA) 60 MG capsule Take 60 mg by mouth at bedtime.  06/17/15  Yes [provider]  furosemide (LASIX) 20 MG tablet Take 1 tablet by mouth daily as needed. 08/02/20 08/02/21 Yes [provider]  omeprazole (PRILOSEC) 20 MG capsule Take 20 mg by mouth 2 (two)  times daily. 07/29/15  Yes [provider]  traMADol (ULTRAM) 50 MG tablet Take 100 mg by mouth 2 (two) times daily.  11/27/18  Yes [provider]  acetaminophen (TYLENOL) 500 MG tablet Take 1,000 mg by mouth every 6 (six) hours as needed for moderate pain or headache.     [provider]  B Complex-C (B-COMPLEX WITH VITAMIN C) tablet Take 1 tablet by mouth daily.    [provider]  Calcium Carbonate-Vitamin D (CALCIUM 600+D PO) Take 1 tablet by mouth 2 (two) times daily.    [provider]  cefUROXime (CEFTIN) 250 MG tablet Take 250 mg by mouth 2 (two) times daily. Patient not taking: Reported on 08/07/2020 05/21/20   [provider]  Cholecalciferol (VITAMIN D3) 50 MCG (2000 UT) TABS Take 2,000 Units by mouth daily.    [provider]  dicyclomine (BENTYL) 20 MG tablet Take by mouth. Patient not taking: Reported on 08/07/2020 06/19/20 06/19/21  [provider]  fluorouracil (EFUDEX) 5 % cream Apply topically 2 (two) times daily. X 7 days 07/24/20   Ralene Bathe, MD  ondansetron (ZOFRAN ODT) 4 MG disintegrating tablet Take 1 tablet (4 mg total) by mouth every 8 (eight) hours as needed for nausea or vomiting. 03/18/20   Pabon, [place], MD  sucralfate (CARAFATE) 1 g tablet Take 1 tablet (1 g total) by mouth 2 (two) times daily. 07/08/20   Jules Husbands, MD    Allergies as of 08/08/2020 - Review Complete 08/07/2020  Allergen Reaction Noted  . Nsaids Other (See Comments) 01/05/2020  . Sulfa antibiotics Rash 08/24/2015    Family History  Problem Relation Age of Onset  . Breast cancer Sister 77  . Breast cancer Paternal Aunt 5  . Breast cancer Cousin        1st paternal cousin    Social History   Socioeconomic History  . Marital status: Divorced    Spouse name: Not on file  . Number of children: Not on file  . Years of education: Not on file  . Highest education level: Not on file  Occupational History  . Not on  file  Tobacco Use  . Smoking status: Never Smoker  . Smokeless tobacco: Never Used  Vaping Use  . Vaping Use: Never used  Substance and Sexual Activity  . Alcohol use: No  . Drug use: No  . Sexual activity: Not on file  Other Topics Concern  . Not on file  Social History Narrative  . Not on file   Social Determinants of Health   Financial Resource Strain: Not on file  Food Insecurity: Not on file  Transportation Needs: Not on file  Physical Activity: Not on file  Stress: Not on file  Social Connections: Not on file  Intimate Partner Violence: Not on file    Review of Systems: See HPI, otherwise negative ROS  Physical Exam: There were no vitals taken for this visit. General:   Alert,  pleasant and cooperative in NAD Head:  Normocephalic and atraumatic. Neck:  Supple; no masses or thyromegaly.  Lungs:  Clear throughout to auscultation, normal respiratory effort.    Heart:  +S1, +S2, Regular rate and rhythm, No edema. Abdomen:  Soft, nontender and nondistended. Normal bowel sounds, without guarding, and without rebound.   Neurologic:  Alert and  oriented x4;  grossly normal neurologically.  Impression/Plan: April Larsen is here for a colonoscopy to be performed forhistory of ulcerative colitis  Risks, benefits, limitations, and alternatives regarding  colonoscopy have been reviewed with the patient.  Questions have been answered.  All parties agreeable.   Virgel Manifold, MD  08/22/2020, 8:08 AM

## 2020-08-22 NOTE — Transfer of Care (Signed)
Immediate Anesthesia Transfer of Care Note  Patient: April Larsen  Procedure(s) Performed: COLONOSCOPY WITH PROPOFOL (N/A )  Patient Location: Endoscopy Unit  Anesthesia Type:General  Level of Consciousness: awake  Airway & Oxygen Therapy: Patient Spontanous Breathing  Post-op Assessment: Report given to RN and Post -op Vital signs reviewed and stable  Post vital signs: Reviewed  Last Vitals:  Vitals Value Taken Time  BP 111/64 08/22/20 0934  Temp    Pulse 73 08/22/20 0934  Resp 15 08/22/20 0934  SpO2 100 % 08/22/20 0934  Vitals shown include unvalidated device data.  Last Pain:  Vitals:   08/22/20 0811  TempSrc: Temporal  PainSc: 3          Complications: No complications documented.

## 2020-08-22 NOTE — Anesthesia Postprocedure Evaluation (Signed)
Anesthesia Post Note  Patient: SAMIE BARCLIFT  Procedure(s) Performed: COLONOSCOPY WITH PROPOFOL (N/A )  Patient location during evaluation: Endoscopy Anesthesia Type: General Level of consciousness: awake and alert Pain management: pain level controlled Vital Signs Assessment: post-procedure vital signs reviewed and stable Respiratory status: spontaneous breathing, nonlabored ventilation, respiratory function stable and patient connected to nasal cannula oxygen Cardiovascular status: blood pressure returned to baseline and stable Postop Assessment: no apparent nausea or vomiting Anesthetic complications: no   No complications documented.   Last Vitals:  Vitals:   08/22/20 0950 08/22/20 1000  BP: 135/85 (!) 157/87  Pulse: 74 73  Resp: 12 12  Temp:    SpO2: 100% 100%    Last Pain:  Vitals:   08/22/20 0930  TempSrc: Temporal  PainSc:                  Precious Haws Sequita Wise

## 2020-08-22 NOTE — Anesthesia Preprocedure Evaluation (Signed)
Anesthesia Evaluation  Patient identified by MRN, date of birth, ID band Patient awake    Reviewed: Allergy & Precautions, NPO status , Patient's Chart, lab work & pertinent test results  History of Anesthesia Complications (+) PONV, Family history of anesthesia reaction and history of anesthetic complications  Airway Mallampati: III  TM Distance: <3 FB Neck ROM: limited    Dental  (+) Chipped   Pulmonary neg pulmonary ROS, neg sleep apnea, neg COPD,    Pulmonary exam normal        Cardiovascular Exercise Tolerance: Good (-) hypertension(-) CAD, (-) Past MI, (-) Cardiac Stents and (-) CABG Normal cardiovascular exam - Systolic murmurs    Neuro/Psych  Headaches, neg Seizures PSYCHIATRIC DISORDERS Anxiety Depression    GI/Hepatic Neg liver ROS, hiatal hernia, PUD, GERD  Medicated and Controlled,  Endo/Other  negative endocrine ROSneg diabetes  Renal/GU negative Renal ROS     Musculoskeletal  (+) Arthritis ,   Abdominal (+) - obese,   Peds  Hematology  (+) Blood dyscrasia, anemia ,   Anesthesia Other Findings Past Medical History: No date: Actinic keratosis No date: Anemia     Comment:  LAST HGB 15.2 ON 04-22-16 No date: Anxiety No date: Arthritis 2016: Cancer [facility])     Comment:  skin 2013: Cataract (lens) fragments in eye following cataract surgery,  bilateral     Comment:  one eye done then 4 weeks later the other eye done. 10/29/2015: Chronic ulcerative colitis, without complications (HCC) No date: Diverticulitis 10/04/2014: Esophagitis, reflux No date: Family history of adverse reaction to anesthesia     Comment:  SISTER HARD TO WAKE UP and nausea and vomiting. 08/09/2013: Female stress incontinence 08/06/2016: Gastroesophageal reflux disease without esophagitis No date: GERD (gastroesophageal reflux disease) No date: Headache     Comment:  MIGRAINE No date: History of hiatal hernia 10/29/2013: Incomplete  emptying of bladder 07/26/2013: Migraine No date: PONV (postoperative nausea and vomiting)     Comment:  nausea, does not remember vomiting. No date: Postherpetic neuralgia     Comment:  RIGHT 7TH.CRANIAL NERVE from shingles followed by Bell's              palsy 11/09/2016: Postmenopausal No date: PVC (premature ventricular contraction) No date: Scoliosis 07/26/2013: Scoliosis (and kyphoscoliosis), idiopathic 01/18/2015: Scoliosis of lumbosacral spine 03/13/2013: Squamous cell carcinoma of skin     Comment:  L clavicle  03/10/2018: Squamous cell carcinoma of skin     Comment:  L med knee 02/21/2019: Squamous cell carcinoma of skin     Comment:  R cheek  06/01/2019: Squamous cell carcinoma of skin     Comment:  L mid lat pretibial - ED&C 11/29/2014: Trochanteric bursitis No date: Ulcerative colitis ([place]) 08/09/2013: Urethral prolapse 08/09/2013: Urge incontinence   Reproductive/Obstetrics                             Anesthesia Physical  Anesthesia Plan  ASA: III  Anesthesia Plan: General   Post-op Pain Management:    Induction: Intravenous  PONV Risk Score and Plan: 3 and Propofol infusion and TIVA  Airway Management Planned: Natural Airway  Additional Equipment:   Intra-op Plan:   Post-operative Plan:   Informed Consent: I have reviewed the patients History and Physical, chart, labs and discussed the procedure including the risks, benefits and alternatives for the proposed anesthesia with the patient or authorized representative who has indicated his/her understanding and acceptance.  Dental advisory given  Plan Discussed with: CRNA and Anesthesiologist  Anesthesia Plan Comments: (Patient consented for risks of anesthesia including but not limited to:  - adverse reactions to medications - risk of airway placement if required - damage to eyes, teeth, lips or other oral mucosa - nerve damage due to positioning  - sore throat or  hoarseness - Damage to heart, brain, nerves, lungs, other parts of body or loss of life  Patient voiced understanding.)        Anesthesia Quick Evaluation

## 2020-08-23 ENCOUNTER — Encounter: Payer: Self-pay | Admitting: Gastroenterology

## 2020-08-23 LAB — SURGICAL PATHOLOGY

## 2020-08-28 DIAGNOSIS — M9901 Segmental and somatic dysfunction of cervical region: Secondary | ICD-10-CM | POA: Diagnosis not present

## 2020-08-28 DIAGNOSIS — M9902 Segmental and somatic dysfunction of thoracic region: Secondary | ICD-10-CM | POA: Diagnosis not present

## 2020-08-28 DIAGNOSIS — M5431 Sciatica, right side: Secondary | ICD-10-CM | POA: Diagnosis not present

## 2020-08-28 DIAGNOSIS — M9903 Segmental and somatic dysfunction of lumbar region: Secondary | ICD-10-CM | POA: Diagnosis not present

## 2020-09-02 DIAGNOSIS — M9902 Segmental and somatic dysfunction of thoracic region: Secondary | ICD-10-CM | POA: Diagnosis not present

## 2020-09-02 DIAGNOSIS — M9903 Segmental and somatic dysfunction of lumbar region: Secondary | ICD-10-CM | POA: Diagnosis not present

## 2020-09-02 DIAGNOSIS — M9901 Segmental and somatic dysfunction of cervical region: Secondary | ICD-10-CM | POA: Diagnosis not present

## 2020-09-02 DIAGNOSIS — M5431 Sciatica, right side: Secondary | ICD-10-CM | POA: Diagnosis not present

## 2020-09-04 DIAGNOSIS — M5416 Radiculopathy, lumbar region: Secondary | ICD-10-CM | POA: Diagnosis not present

## 2020-09-04 DIAGNOSIS — I872 Venous insufficiency (chronic) (peripheral): Secondary | ICD-10-CM | POA: Diagnosis not present

## 2020-09-04 DIAGNOSIS — R600 Localized edema: Secondary | ICD-10-CM | POA: Diagnosis not present

## 2020-09-04 DIAGNOSIS — D692 Other nonthrombocytopenic purpura: Secondary | ICD-10-CM | POA: Diagnosis not present

## 2020-09-10 DIAGNOSIS — M9903 Segmental and somatic dysfunction of lumbar region: Secondary | ICD-10-CM | POA: Diagnosis not present

## 2020-09-10 DIAGNOSIS — M5431 Sciatica, right side: Secondary | ICD-10-CM | POA: Diagnosis not present

## 2020-09-10 DIAGNOSIS — M9901 Segmental and somatic dysfunction of cervical region: Secondary | ICD-10-CM | POA: Diagnosis not present

## 2020-09-10 DIAGNOSIS — M9902 Segmental and somatic dysfunction of thoracic region: Secondary | ICD-10-CM | POA: Diagnosis not present

## 2020-09-16 DIAGNOSIS — M5431 Sciatica, right side: Secondary | ICD-10-CM | POA: Diagnosis not present

## 2020-09-16 DIAGNOSIS — M9901 Segmental and somatic dysfunction of cervical region: Secondary | ICD-10-CM | POA: Diagnosis not present

## 2020-09-16 DIAGNOSIS — M9903 Segmental and somatic dysfunction of lumbar region: Secondary | ICD-10-CM | POA: Diagnosis not present

## 2020-09-16 DIAGNOSIS — M9902 Segmental and somatic dysfunction of thoracic region: Secondary | ICD-10-CM | POA: Diagnosis not present

## 2020-09-17 ENCOUNTER — Ambulatory Visit (INDEPENDENT_AMBULATORY_CARE_PROVIDER_SITE_OTHER): Payer: PPO | Admitting: Urology

## 2020-09-17 ENCOUNTER — Encounter: Payer: Self-pay | Admitting: Urology

## 2020-09-17 ENCOUNTER — Other Ambulatory Visit: Payer: Self-pay

## 2020-09-17 VITALS — BP 131/81 | HR 96 | Ht 65.0 in | Wt 170.0 lb

## 2020-09-17 DIAGNOSIS — R310 Gross hematuria: Secondary | ICD-10-CM | POA: Diagnosis not present

## 2020-09-17 DIAGNOSIS — N3941 Urge incontinence: Secondary | ICD-10-CM

## 2020-09-17 DIAGNOSIS — M5442 Lumbago with sciatica, left side: Secondary | ICD-10-CM | POA: Diagnosis not present

## 2020-09-17 DIAGNOSIS — R258 Other abnormal involuntary movements: Secondary | ICD-10-CM | POA: Diagnosis not present

## 2020-09-17 DIAGNOSIS — R292 Abnormal reflex: Secondary | ICD-10-CM | POA: Diagnosis not present

## 2020-09-17 DIAGNOSIS — M419 Scoliosis, unspecified: Secondary | ICD-10-CM | POA: Diagnosis not present

## 2020-09-17 LAB — URINALYSIS, COMPLETE
Bilirubin, UA: NEGATIVE
Glucose, UA: NEGATIVE
Ketones, UA: NEGATIVE
Leukocytes,UA: NEGATIVE
Nitrite, UA: NEGATIVE
Protein,UA: NEGATIVE
RBC, UA: NEGATIVE
Specific Gravity, UA: 1.025 (ref 1.005–1.030)
Urobilinogen, Ur: 0.2 mg/dL (ref 0.2–1.0)
pH, UA: 5.5 (ref 5.0–7.5)

## 2020-09-17 LAB — BLADDER SCAN AMB NON-IMAGING

## 2020-09-17 LAB — MICROSCOPIC EXAMINATION
Bacteria, UA: NONE SEEN
RBC, Urine: NONE SEEN /hpf (ref 0–2)
WBC, UA: NONE SEEN /hpf (ref 0–5)

## 2020-09-17 NOTE — Patient Instructions (Signed)
Cystoscopy Cystoscopy is a procedure that is used to help diagnose and sometimes treat conditions that affect the lower urinary tract. The lower urinary tract includes the bladder and the urethra. The urethra is the tube that drains urine from the bladder. Cystoscopy is done using a thin, tube-shaped instrument with a light and camera at the end (cystoscope). The cystoscope may be hard or flexible, depending on the goal of the procedure. The cystoscope is inserted through the urethra, into the bladder. Cystoscopy may be recommended if you have: Urinary tract infections that keep coming back. Blood in the urine (hematuria). An inability to control when you urinate (urinary incontinence) or an overactive bladder. Unusual cells found in a urine sample. A blockage in the urethra, such as a urinary stone. Painful urination. An abnormality in the bladder found during an intravenous pyelogram (IVP) or CT scan. Cystoscopy may also be done to remove a sample of tissue to be examined under a microscope (biopsy). What are the risks? Generally, this is a safe procedure. However, problems may occur, including: Infection. Bleeding.  What happens during the procedure?  You will be given one or more of the following: A medicine to numb the area (local anesthetic). The area around the opening of your urethra will be cleaned. The cystoscope will be passed through your urethra into your bladder. Germ-free (sterile) fluid will flow through the cystoscope to fill your bladder. The fluid will stretch your bladder so that your health care provider can clearly examine your bladder walls. Your doctor will look at the urethra and bladder. The cystoscope will be removed The procedure may vary among health care providers  What can I expect after the procedure? After the procedure, it is common to have: Some soreness or pain in your abdomen and urethra. Urinary symptoms. These include: Mild pain or burning when you  urinate. Pain should stop within a few minutes after you urinate. This may last for up to 1 week. A small amount of blood in your urine for several days. Feeling like you need to urinate but producing only a small amount of urine. Follow these instructions at home: General instructions Return to your normal activities as told by your health care provider.  Do not drive for 24 hours if you were given a sedative during your procedure. Watch for any blood in your urine. If the amount of blood in your urine increases, call your health care provider. If a tissue sample was removed for testing (biopsy) during your procedure, it is up to you to get your test results. Ask your health care provider, or the department that is doing the test, when your results will be ready. Drink enough fluid to keep your urine pale yellow. Keep all follow-up visits as told by your health care provider. This is important. Contact a health care provider if you: Have pain that gets worse or does not get better with medicine, especially pain when you urinate. Have trouble urinating. Have more blood in your urine. Get help right away if you: Have blood clots in your urine. Have abdominal pain. Have a fever or chills. Are unable to urinate. Summary Cystoscopy is a procedure that is used to help diagnose and sometimes treat conditions that affect the lower urinary tract. Cystoscopy is done using a thin, tube-shaped instrument with a light and camera at the end. After the procedure, it is common to have some soreness or pain in your abdomen and urethra. Watch for any blood in your urine.   If the amount of blood in your urine increases, call your health care provider. If you were prescribed an antibiotic medicine, take it as told by your health care provider. Do not stop taking the antibiotic even if you start to feel better. This information is not intended to replace advice given to you by your health care provider. Make  sure you discuss any questions you have with your health care provider. Document Revised: 03/08/2018 Document Reviewed: 03/08/2018 Elsevier Patient Education  2020 [employer]vier Inc.  

## 2020-09-17 NOTE — Progress Notes (Signed)
09/17/2020 2:46 PM   April Larsen [birth year] [id number]  Referring provider: Rusty Aus, MD [place] [facility] [place],  [place] [address]  Chief Complaint  Patient presents with   Hematuria    New Patient    HPI: 75 year old female who presents today for further evaluation of urinary symptoms and gross hematuria.  She is accompanied today by her daughter.  She reports in February, she had an episode of which she felt like was gross hematuria.  She had blood on the tissue with she wiped, mixed in with the toilet as well as in her continence pad.  At the time, she was having episodes of urinary incontinence and thinks is probably coming from her bladder rather than vagina.  She was seen by OB/GYN and underwent pelvic exam which describes "bleeding present" but otherwise unremarkable.  She is status post hysterectomy.  Pelvic ultrasound was unremarkable.  At the time, she had 10-50 red blood cells per high-powered field, rare bacteria, no leukocytes and negative nitrates in her urine.  Associated urine culture was negative.  She was treated for presumed UTI in light of blood in the urine and irritative urinary symptoms including urgency frequency incontinence which were worse than her baseline.  She reports today that she had another episode in March with similar presentation.  She was prescribed and took [place].  Urine at this time was bland and no bacteria were seen.  Today, she is having less urgency frequency without dysuria.  Her urinary symptoms are at her baseline with minimal bother.  She is accompanied today by her daughter.  She reports that over the last year, she is seen multiple health issues and her mother and this summer, they are working through each of them.  This is one of her issues.  Never smoker.  No chemical exposures.  She does have extensive secondhand smoking.  She has a personal history of urethral prolapse.  She  underwent urethral prolapse excision with Dr. Jacqlyn Larsen in 2015 without any issues.  She has not had any recurrence.  Most recent upper tract imaging in the form of CT abdomen pelvis with contrast on 04/25/2020 showed no GU pathology.  PMH: Past Medical History:  Diagnosis Date   Actinic keratosis    Anemia    LAST HGB 15.2 ON 04-22-16   Anxiety    Arthritis    Cancer ([place]) 2016   skin   Cataract (lens) fragments in eye following cataract surgery, bilateral 2013   one eye done then 4 weeks later the other eye done.   Chronic ulcerative colitis, without complications ([place]) 10/31/6657   Diverticulitis    Esophagitis, reflux 10/04/2014   Family history of adverse reaction to anesthesia    SISTER HARD TO WAKE UP and nausea and vomiting.   Female stress incontinence 08/09/2013   Gastroesophageal reflux disease without esophagitis 08/06/2016   GERD (gastroesophageal reflux disease)    Headache    MIGRAINE   History of hiatal hernia    Incomplete emptying of bladder 10/29/2013   Migraine 07/26/2013   PONV (postoperative nausea and vomiting)    nausea, does not remember vomiting.   Postherpetic neuralgia    RIGHT 7TH.CRANIAL NERVE from shingles followed by Bell's palsy   Postmenopausal 11/09/2016   PVC (premature ventricular contraction)    Scoliosis    Scoliosis (and kyphoscoliosis), idiopathic 07/26/2013   Scoliosis of lumbosacral spine 01/18/2015   Squamous cell carcinoma of skin 03/13/2013   L  clavicle    Squamous cell carcinoma of skin 03/10/2018   L med knee   Squamous cell carcinoma of skin 02/21/2019   R cheek    Squamous cell carcinoma of skin 06/01/2019   L mid lat pretibial - ED&C   Trochanteric bursitis 11/29/2014   Ulcerative colitis ([place])    Urethral prolapse 08/09/2013   Urge incontinence 08/09/2013    Surgical History: Past Surgical History:  Procedure Laterality Date   ABDOMINAL HYSTERECTOMY     BACK SURGERY     L3-L4 AND L3-S1 DECOMPRESSION   CARPAL TUNNEL RELEASE      CHOLECYSTECTOMY N/A 08/21/2016   Procedure: LAPAROSCOPIC CHOLECYSTECTOMY WITH INTRAOPERATIVE CHOLANGIOGRAM;  Surgeon: Leonie Green, MD;  Location: [facility] ORS;  Service: General;  Laterality: N/A;   COLON SURGERY     COLONOSCOPY  11/06/2005   COLONOSCOPY WITH PROPOFOL N/A 07/29/2016   Procedure: COLONOSCOPY WITH PROPOFOL;  Surgeon: Manya Silvas, MD;  Location[facility] ENDOSCOPY;  Service: Endoscopy;  Laterality: N/A;   COLONOSCOPY WITH PROPOFOL N/A 08/22/2020   Procedure: COLONOSCOPY WITH PROPOFOL;  Surgeon: Virgel Manifold, MD;  Location: [facility] ENDOSCOPY;  Service: Endoscopy;  Laterality: N/A;   ESOPHAGOGASTRODUODENOSCOPY  11/06/2005   ESOPHAGOGASTRODUODENOSCOPY (EGD) WITH PROPOFOL N/A 07/29/2016   Procedure: ESOPHAGOGASTRODUODENOSCOPY (EGD) WITH PROPOFOL;  Surgeon: Manya Silvas, MD;  Location: [facility] ENDOSCOPY;  Service: Endoscopy;  Laterality: N/A;   ESOPHAGOGASTRODUODENOSCOPY (EGD) WITH PROPOFOL N/A 02/07/2020   Procedure: ESOPHAGOGASTRODUODENOSCOPY (EGD) WITH PROPOFOL;  Surgeon: Virgel Manifold, MD;  Location: [facility] ENDOSCOPY;  Service: Endoscopy;  Laterality: N/A;   LAPAROSCOPIC LOW ANTERIOR RESECTION  11/24/2016   Procedure: LAPAROSCOPIC LOW ANTERIOR RESECTION;  Surgeon: Jules Husbands, MD;  Location: [facility] ORS;  Service: General;;   XI ROBOTIC ASSISTED PARAESOPHAGEAL HERNIA REPAIR N/A 02/27/2020   Procedure: XI ROBOTIC ASSISTED PARAESOPHAGEAL HERNIA REPAIR;  Surgeon: Jules Husbands, MD;  Location: [facility] ORS;  Service: General;  Laterality: N/A;    Home Medications:  Allergies as of 09/17/2020       Reactions   Nsaids Other (See Comments)   Patient has had part of her colon removed    Sulfa Antibiotics Rash        Medication List        Accurate as of September 17, 2020  2:46 PM. If you have any questions, ask your nurse or doctor.          STOP taking these medications    cefUROXime 250 MG tablet Commonly known as: CEFTIN Stopped by: Hollice Espy, MD    dicyclomine 20 MG tablet Commonly known as: BENTYL Stopped by: Hollice Espy, MD   ondansetron 4 MG disintegrating tablet Commonly known as: Zofran ODT Stopped by: Hollice Espy, MD   sucralfate 1 g tablet Commonly known as: Carafate Stopped by: Hollice Espy, MD       TAKE these medications    acetaminophen 500 MG tablet Commonly known as: TYLENOL Take 1,000 mg by mouth every 6 (six) hours as needed for moderate pain or headache.   B-complex with vitamin C tablet Take 1 tablet by mouth daily.   buPROPion 150 MG 24 hr tablet Commonly known as: WELLBUTRIN XL Take 150 mg by mouth daily.   CALCIUM 600+D PO Take 1 tablet by mouth 2 (two) times daily.   DULoxetine 60 MG capsule Commonly known as: CYMBALTA Take 60 mg by mouth at bedtime.   fluorouracil 5 % cream Commonly known as: EFUDEX Apply topically 2 (two) times daily. X 7 days  furosemide 20 MG tablet Commonly known as: LASIX Take 1 tablet by mouth daily as needed.   omeprazole 20 MG capsule Commonly known as: PRILOSEC Take 20 mg by mouth 2 (two) times daily.   traMADol 50 MG tablet Commonly known as: ULTRAM Take 100 mg by mouth 2 (two) times daily.   triamterene-hydrochlorothiazide 37.5-25 MG capsule Commonly known as: DYAZIDE Take 1 capsule by mouth every morning.   Vitamin D3 50 MCG (2000 UT) Tabs Take 2,000 Units by mouth daily.        Allergies:  Allergies  Allergen Reactions   Nsaids Other (See Comments)    Patient has had part of her colon removed    Sulfa Antibiotics Rash    Family History: Family History  Problem Relation Age of Onset   Breast cancer Sister 51   Breast cancer Paternal Aunt 31   Breast cancer Cousin        1st paternal cousin   Bladder Cancer Neg Hx    Prostate cancer Neg Hx    Kidney cancer Neg Hx     Social History:  reports that she has never smoked. She has never used smokeless tobacco. She reports that she does not drink alcohol and does not use  drugs.   Physical Exam: BP 131/81   Pulse 96   Ht 5' 5"  (1.651 m)   Wt 170 lb (77.1 kg)   BMI 28.29 kg/m   Constitutional:  Alert and oriented, No acute distress. HEENT: [place] AT, moist mucus membranes.  Trachea midline, no masses. Cardiovascular: No clubbing, cyanosis, or edema. Respiratory: Normal respiratory effort, no increased work of breathing. Skin: No rashes, bruises or suspicious lesions. Neurologic: Grossly intact, no focal deficits, moving all 4 extremities. Psychiatric: Normal mood and affect.  Laboratory Data: Lab Results  Component Value Date   WBC 14.2 (H) 02/28/2020   HGB 13.3 02/28/2020   HCT 40.0 02/28/2020   MCV 95.0 02/28/2020   PLT 319 02/28/2020    Lab Results  Component Value Date   CREATININE 0.80 04/25/2020   Urinalysis UA today is negative  Pertinent Imaging: Results for orders placed or performed in visit on 09/17/20  BLADDER SCAN AMB NON-IMAGING  Result Value Ref Range   Scan Result 63m     Assessment & Plan:    1. Gross hematuria Possible episode of gross hematuria in the absence of infection (differential diagnosis also includes vaginal bleeding source, somewhat unclear)  Certainly, there is documented history of microscopic hematuria in the absence of infection  As such, we discussed hematuria evaluation which includes office cystoscopy.  We discussed the risks of this in detail.  In terms of upper tract imaging, she did have a contrasted CT scan in January as such, can hold off for the time being pending cystoscopic findings.  If she does not have another episode of gross hematuria, would recommend pursuing CT urogram.  - Urinalysis, Complete  2. Urge incontinence Improved, likely exacerbated by furosemide and other diuretic blood pressure medication  Consider pharmacotherapy if symptoms worsen - BLADDER SCAN AMB NON-IMAGING    Return for cysto.  AHollice Espy MD  [facility] [address]  [place] [address]([phone]

## 2020-09-18 DIAGNOSIS — M9901 Segmental and somatic dysfunction of cervical region: Secondary | ICD-10-CM | POA: Diagnosis not present

## 2020-09-18 DIAGNOSIS — M9902 Segmental and somatic dysfunction of thoracic region: Secondary | ICD-10-CM | POA: Diagnosis not present

## 2020-09-18 DIAGNOSIS — M5431 Sciatica, right side: Secondary | ICD-10-CM | POA: Diagnosis not present

## 2020-09-18 DIAGNOSIS — M9903 Segmental and somatic dysfunction of lumbar region: Secondary | ICD-10-CM | POA: Diagnosis not present

## 2020-09-20 ENCOUNTER — Other Ambulatory Visit (HOSPITAL_COMMUNITY): Payer: Self-pay | Admitting: Neurosurgery

## 2020-09-20 ENCOUNTER — Other Ambulatory Visit: Payer: Self-pay | Admitting: Neurosurgery

## 2020-09-20 DIAGNOSIS — G8929 Other chronic pain: Secondary | ICD-10-CM

## 2020-09-20 DIAGNOSIS — M542 Cervicalgia: Secondary | ICD-10-CM

## 2020-09-20 DIAGNOSIS — M5442 Lumbago with sciatica, left side: Secondary | ICD-10-CM

## 2020-09-25 ENCOUNTER — Ambulatory Visit: Payer: PPO | Admitting: Urology

## 2020-09-29 ENCOUNTER — Other Ambulatory Visit: Payer: Self-pay

## 2020-09-29 ENCOUNTER — Ambulatory Visit
Admission: RE | Admit: 2020-09-29 | Discharge: 2020-09-29 | Disposition: A | Discharge status: Home / Self Care | Payer: PPO | Source: Ambulatory Visit | Attending: Neurosurgery | Admitting: Neurosurgery

## 2020-09-29 DIAGNOSIS — M545 Low back pain, unspecified: Secondary | ICD-10-CM | POA: Diagnosis not present

## 2020-09-29 DIAGNOSIS — M542 Cervicalgia: Secondary | ICD-10-CM

## 2020-09-29 DIAGNOSIS — M5441 Lumbago with sciatica, right side: Secondary | ICD-10-CM | POA: Insufficient documentation

## 2020-09-29 DIAGNOSIS — G8929 Other chronic pain: Secondary | ICD-10-CM | POA: Diagnosis not present

## 2020-09-29 DIAGNOSIS — M5442 Lumbago with sciatica, left side: Secondary | ICD-10-CM | POA: Insufficient documentation

## 2020-10-01 ENCOUNTER — Encounter: Payer: Self-pay | Admitting: Urology

## 2020-10-01 ENCOUNTER — Ambulatory Visit (INDEPENDENT_AMBULATORY_CARE_PROVIDER_SITE_OTHER): Payer: PPO | Admitting: Urology

## 2020-10-01 ENCOUNTER — Other Ambulatory Visit: Payer: Self-pay

## 2020-10-01 VITALS — BP 132/81 | HR 97 | Ht 65.0 in | Wt 170.0 lb

## 2020-10-01 DIAGNOSIS — R310 Gross hematuria: Secondary | ICD-10-CM | POA: Diagnosis not present

## 2020-10-01 DIAGNOSIS — N3000 Acute cystitis without hematuria: Secondary | ICD-10-CM | POA: Diagnosis not present

## 2020-10-01 MED ORDER — NITROFURANTOIN MONOHYD MACRO 100 MG PO CAPS: 100.0000 mg | ORAL_CAPSULE | Freq: Two times a day (BID) | ORAL | 0 refills | Status: DC

## 2020-10-01 NOTE — Progress Notes (Signed)
10/01/20  HPI: 75 year old female who presents today for cystoscopic evaluation of possible gross hematuria.  She reports today however that over the past week, she has not been feeling well.  She has had increased urinary urgency frequency and dysuria and is worried she might have a urinary tract infection today.  No fevers or chills.  No gross hematuria episodes.  She also mentions today that she did mention before but when she uses the bathroom and wipes her urethra, if she compresses her urethra, she dribbles a little bit more.  Objective:  Blood pressure 132/81, pulse 97, height 5' 5"  (1.651 m), weight 170 lb (77.1 kg). No acute distress  UA consistent with greater than 30 WBCs, 3-10 RBCs, moderate bacteria, nitrite negative.  Plan:  1. Acute cystitis without hematuria Symptomatic UTI, will treat with Macrobid twice daily x10 days and adjust as needed based on culture and sensitivity data  2. Gross hematuria We discussed rescheduling her cystoscopy for in about 2 weeks, will pay close attention to urethral anatomy in light of nature of symptoms  Low threshold to pursue CT urogram if she has an episode of gross hematuria - Urinalysis, Complete - CULTURE, URINE COMPREHENSIVE    Hollice Espy, MD

## 2020-10-02 LAB — URINALYSIS, COMPLETE
Bilirubin, UA: NEGATIVE
Glucose, UA: NEGATIVE
Nitrite, UA: NEGATIVE
Specific Gravity, UA: 1.02 (ref 1.005–1.030)
Urobilinogen, Ur: 0.2 mg/dL (ref 0.2–1.0)
pH, UA: 6.5 (ref 5.0–7.5)

## 2020-10-02 LAB — MICROSCOPIC EXAMINATION: WBC, UA: >30 /hpf — ABNORMAL HIGH (ref 0–5)

## 2020-10-03 DIAGNOSIS — K449 Diaphragmatic hernia without obstruction or gangrene: Secondary | ICD-10-CM | POA: Diagnosis not present

## 2020-10-03 DIAGNOSIS — R0600 Dyspnea, unspecified: Secondary | ICD-10-CM | POA: Diagnosis not present

## 2020-10-03 DIAGNOSIS — I208 Other forms of angina pectoris: Secondary | ICD-10-CM | POA: Diagnosis not present

## 2020-10-04 ENCOUNTER — Telehealth: Payer: Self-pay | Admitting: *Deleted

## 2020-10-04 LAB — CULTURE, URINE COMPREHENSIVE

## 2020-10-04 MED ORDER — CEPHALEXIN 500 MG PO CAPS: 500.0000 mg | ORAL_CAPSULE | Freq: Three times a day (TID) | ORAL | 0 refills | Status: DC

## 2020-10-04 NOTE — Telephone Encounter (Addendum)
Patient informed, sent in Keflex. Voiced understanding to discontinue Macrobid.  ----- Message from Hollice Espy, MD sent at 10/04/2020  4:04 PM EDT ----- Urine culture is only intermediately sensitive to nitrofurantoin.  Please switch antibiotics to Keflex 500 mg 3 times a day for 7 days.  Hollice Espy, MD

## 2020-10-08 DIAGNOSIS — I700 Atherosclerosis of aorta: Secondary | ICD-10-CM | POA: Diagnosis not present

## 2020-10-08 DIAGNOSIS — I10 Essential (primary) hypertension: Secondary | ICD-10-CM | POA: Diagnosis present

## 2020-10-08 DIAGNOSIS — R079 Chest pain, unspecified: Secondary | ICD-10-CM | POA: Diagnosis not present

## 2020-10-08 DIAGNOSIS — R0602 Shortness of breath: Secondary | ICD-10-CM | POA: Diagnosis not present

## 2020-10-08 DIAGNOSIS — E782 Mixed hyperlipidemia: Secondary | ICD-10-CM | POA: Diagnosis not present

## 2020-10-11 ENCOUNTER — Other Ambulatory Visit: Payer: Self-pay | Admitting: Neurosurgery

## 2020-10-11 ENCOUNTER — Telehealth: Payer: Self-pay

## 2020-10-11 DIAGNOSIS — M48061 Spinal stenosis, lumbar region without neurogenic claudication: Secondary | ICD-10-CM

## 2020-10-11 DIAGNOSIS — M4722 Other spondylosis with radiculopathy, cervical region: Secondary | ICD-10-CM | POA: Diagnosis not present

## 2020-10-11 DIAGNOSIS — M419 Scoliosis, unspecified: Secondary | ICD-10-CM | POA: Diagnosis not present

## 2020-10-11 NOTE — Progress Notes (Signed)
Phone call to patient to verify medication list and allergies for myelogram procedure. Pt instructed to hold Wellbutrin and Cymbalta for 48hrs prior to myelogram appointment time and 24 hours after appointment. Pt also instructed to have a driver the day of the procedure, the procedure would take around 2 hours, and discharge instructions discussed. Pt verbalized understanding.

## 2020-10-14 ENCOUNTER — Other Ambulatory Visit: Payer: Self-pay

## 2020-10-14 ENCOUNTER — Ambulatory Visit: Payer: PPO | Admitting: Gastroenterology

## 2020-10-14 VITALS — BP 120/82 | HR 97 | Temp 98.2°F | Wt 165.2 lb

## 2020-10-14 DIAGNOSIS — R197 Diarrhea, unspecified: Secondary | ICD-10-CM | POA: Diagnosis not present

## 2020-10-14 DIAGNOSIS — K5100 Ulcerative (chronic) pancolitis without complications: Secondary | ICD-10-CM

## 2020-10-14 MED ORDER — MESALAMINE 1.2 G PO TBEC: 2.4000 g | DELAYED_RELEASE_TABLET | Freq: Every day | ORAL | 0 refills | Status: DC

## 2020-10-14 NOTE — Progress Notes (Signed)
April Antigua, MD [address]  [place]  [place], [place] [address]  Main: [phone]  Fax: [phone]   Primary Care Physician: April Aus, MD   Chief Complaint  Patient presents with   Follow-up    Pt is having loose stools daily, almost after each meal... Pt denies changes in diet or medication... having fecal incontinence... intermittent nausea in AM...  Drinking at least 6 cups of water daily    HPI: April Larsen is a 75 y.o. female presents with her daughter for evaluation of diarrhea.  Patient reports 1 month of urgency, and 2-3 loose stools a day.  She stopped taking Metamucil that she was previously taking for constipation.  Reports bilateral lower quadrant abdominal cramping and dull pain intermittent, about twice a week, unrelated to meals.  Reports subjective weight loss over the last 2 to 3 weeks.  No blood in stool.  Reports decrease in appetite.  No nausea or vomiting.  No fever or chills.  Patient underwent colonoscopy on Aug 22, 2020  Impression:            - Erythematous mucosa in the ascending colon and in                        the cecum. Biopsied.                        - Diverticulosis in the sigmoid colon.                        - Patent surgical anastomosis, characterized by                        erythema.                        - The examination was otherwise normal.                        - The rectum, sigmoid colon, descending colon,                        transverse colon and terminal ileum are normal.                        Biopsied.                        - Non-bleeding internal hemorrhoids.  DIAGNOSIS:  A. TERMINAL ILEUM; COLD BIOPSY:  - ENTERIC MUCOSA WITH NORMAL VILLOUS ARCHITECTURE AND NO SIGNIFICANT  HISTOPATHOLOGIC CHANGE.  - SINGLE FRAGMENT OF COLONIC MUCOSA WITH NO SIGNIFICANT HISTOPATHOLOGIC  CHANGE.  - NEGATIVE FOR ACTIVE ILEITIS/COLITIS.  - NEGATIVE FOR GRANULOMA, DYSPLASIA, AND MALIGNANCY.   B. COLON, CECUM  AND ASCENDING; COLD BIOPSY:  - CHRONIC COLITIS WITH MILD ACTIVITY (FOCAL CRYPTITIS AND SUPERFICIAL  ACTIVE MUCOSAL INFLAMMATION).  - NEGATIVE FOR GRANULOMA, DYSPLASIA, AND MALIGNANCY.   C. COLON, TRANSVERSE AND DESCENDING; COLD BIOPSY:  - CHRONIC COLITIS WITHOUT ACTIVITY.  - NEGATIVE FOR GRANULOMA, DYSPLASIA, AND MALIGNANCY.   D. COLON, RECTOSIGMOID; COLD BIOPSY:  - CHRONIC PROCTITIS WITHOUT ACTIVITY.  - NEGATIVE FOR GRANULOMA, DYSPLASIA, AND MALIGNANCY.   Previous history: She was previously followed by [facility] clinic GI, Dr. Vira Larsen, and has a history of ulcerative colitis.  However,  she has not been on any medications for years.  Over the years, patient has had some urinary  incontinence, and reports urgency with her bowel movements.  However, she also reports constipation on some days, followed by days where she has a loose bowel movement.  No blood in stool.  No weight loss.  No abdominal pain.  No nausea or vomiting.   Previous history: Patient has a history of chronic ulcerative colitis documented in her chart.  On further review of her previous documentation, she was seen by [facility] clinic GI for this but the history is somewhat unclear.  She has not been on any UC meds for years.I am able to see a pathology report from 2013 in her chart that shows mild chronic inactive colitis in all the biopsy specimens.  Procedure report not available.  She had a colonoscopy in 2018 that was normal with biopsies showing unremarkable colonic mucosa throughout.  She also had laparoscopic sigmoid resection for diverticulitis in 2018.   As per February 2018 note by Denice Paradise, Dr. Percell Boston PA, "Patient was hospitalized for C-diff colitis in 2010. Her colonoscopy 02/08/09 showing diverticulosis in the sigmoid colon, descending colon, transverse colon, internal hemorrhoids, and colon otherwise appeared normal. Biopsies of the ileocecal valve showed mild active colitis with architectural features of  chronicity.The patient was having a lot of diarrhea at the time, quite symptomatic, with elevated ESR/CRP and Dr. Vira Larsen felt the colonoscopy was not that impressive. She was given Entocort."  Assessment plan on that same note advises to stop the Entocort.  ?So essentially patient was on Entocort without a clear diagnosis from 2010 to 2018     ROS: All ROS reviewed and negative except as per HPI   Past Medical History:  Diagnosis Date   Actinic keratosis    Anemia    LAST HGB 15.2 ON 04-22-16   Anxiety    Arthritis    Cancer ([place]) 2016   skin   Cataract (lens) fragments in eye following cataract surgery, bilateral 2013   one eye done then 4 weeks later the other eye done.   Chronic ulcerative colitis, without complications ([place]) 09/30/812   Diverticulitis    Esophagitis, reflux 10/04/2014   Family history of adverse reaction to anesthesia    SISTER HARD TO WAKE UP and nausea and vomiting.   Female stress incontinence 08/09/2013   Gastroesophageal reflux disease without esophagitis 08/06/2016   GERD (gastroesophageal reflux disease)    Headache    MIGRAINE   History of hiatal hernia    Incomplete emptying of bladder 10/29/2013   Migraine 07/26/2013   PONV (postoperative nausea and vomiting)    nausea, does not remember vomiting.   Postherpetic neuralgia    RIGHT 7TH.CRANIAL NERVE from shingles followed by Bell's palsy   Postmenopausal 11/09/2016   PVC (premature ventricular contraction)    Scoliosis    Scoliosis (and kyphoscoliosis), idiopathic 07/26/2013   Scoliosis of lumbosacral spine 01/18/2015   Squamous cell carcinoma of skin 03/13/2013   L clavicle    Squamous cell carcinoma of skin 03/10/2018   L med knee   Squamous cell carcinoma of skin 02/21/2019   R cheek    Squamous cell carcinoma of skin 06/01/2019   L mid lat pretibial - ED&C   Trochanteric bursitis 11/29/2014   Ulcerative colitis ([place])    Urethral prolapse 08/09/2013   Urge incontinence 08/09/2013    Past  Surgical History:  Procedure Laterality Date   ABDOMINAL HYSTERECTOMY  BACK SURGERY     L3-L4 AND L3-S1 DECOMPRESSION   CARPAL TUNNEL RELEASE     CHOLECYSTECTOMY N/A 08/21/2016   Procedure: LAPAROSCOPIC CHOLECYSTECTOMY WITH INTRAOPERATIVE CHOLANGIOGRAM;  Surgeon: Leonie Green, MD;  Location: [facility] ORS;  Service: General;  Laterality: N/A;   COLON SURGERY     COLONOSCOPY  11/06/2005   COLONOSCOPY WITH PROPOFOL N/A 07/29/2016   Procedure: COLONOSCOPY WITH PROPOFOL;  Surgeon: Manya Silvas, MD;  Location: [facility] ENDOSCOPY;  Service: Endoscopy;  Laterality: N/A;   COLONOSCOPY WITH PROPOFOL N/A 08/22/2020   Procedure: COLONOSCOPY WITH PROPOFOL;  Surgeon: Virgel Manifold, MD;  Location: [facility] ENDOSCOPY;  Service: Endoscopy;  Laterality: N/A;   ESOPHAGOGASTRODUODENOSCOPY  11/06/2005   ESOPHAGOGASTRODUODENOSCOPY (EGD) WITH PROPOFOL N/A 07/29/2016   Procedure: ESOPHAGOGASTRODUODENOSCOPY (EGD) WITH PROPOFOL;  Surgeon: Manya Silvas, MD;  Location: [facility] ENDOSCOPY;  Service: Endoscopy;  Laterality: N/A;   ESOPHAGOGASTRODUODENOSCOPY (EGD) WITH PROPOFOL N/A 02/07/2020   Procedure: ESOPHAGOGASTRODUODENOSCOPY (EGD) WITH PROPOFOL;  Surgeon: Virgel Manifold, MD;  Location: [facility] ENDOSCOPY;  Service: Endoscopy;  Laterality: N/A;   LAPAROSCOPIC LOW ANTERIOR RESECTION  11/24/2016   Procedure: LAPAROSCOPIC LOW ANTERIOR RESECTION;  Surgeon: Jules Husbands, MD;  Location: [facility] ORS;  Service: General;;   XI ROBOTIC ASSISTED PARAESOPHAGEAL HERNIA REPAIR N/A 02/27/2020   Procedure: XI ROBOTIC ASSISTED PARAESOPHAGEAL HERNIA REPAIR;  Surgeon: Jules Husbands, MD;  Location: [facility] ORS;  Service: General;  Laterality: N/A;    Prior to Admission medications   Medication Sig Start Date End Date Taking? Authorizing Provider  acetaminophen (TYLENOL) 500 MG tablet Take 1,000 mg by mouth every 6 (six) hours as needed for moderate pain or headache.    Yes [provider]  B Complex-C (B-COMPLEX WITH  VITAMIN C) tablet Take 1 tablet by mouth daily.   Yes [provider]  Calcium Carbonate-Vitamin D (CALCIUM 600+D PO) Take 1 tablet by mouth 2 (two) times daily.   Yes [provider]  cephALEXin (KEFLEX) 500 MG capsule Take 1 capsule (500 mg total) by mouth 3 (three) times daily. 10/04/20  Yes Hollice Espy, MD  Cholecalciferol (VITAMIN D3) 50 MCG (2000 UT) TABS Take 2,000 Units by mouth daily.   Yes [provider]  DULoxetine (CYMBALTA) 60 MG capsule Take 60 mg by mouth at bedtime.  06/17/15  Yes [provider]  fluorouracil (EFUDEX) 5 % cream Apply topically 2 (two) times daily. X 7 days 07/24/20  Yes Ralene Bathe, MD  furosemide (LASIX) 20 MG tablet Take 1 tablet by mouth daily as needed. 08/02/20 08/02/21 Yes [provider]  mesalamine (LIALDA) 1.2 g EC tablet Take 2 tablets (2.4 g total) by mouth daily. 10/14/20 01/12/21 Yes Virgel Manifold, MD  omeprazole (PRILOSEC) 20 MG capsule Take 20 mg by mouth 2 (two) times daily. 07/29/15  Yes [provider]  pantoprazole (PROTONIX) 40 MG tablet Take 40 mg by mouth daily.   Yes [provider]  triamterene-hydrochlorothiazide (DYAZIDE) 37.5-25 MG capsule Take 1 capsule by mouth every morning. 09/04/20  Yes [provider]  buPROPion (WELLBUTRIN XL) 150 MG 24 hr tablet Take 150 mg by mouth daily. 12/08/18 10/01/20  [provider]    Family History  Problem Relation Age of Onset   Breast cancer Sister 64   Breast cancer Paternal Aunt 41   Breast cancer Cousin        1st paternal cousin   Bladder Cancer Neg Hx    Prostate cancer Neg Hx    Kidney  cancer Neg Hx      Social History   Tobacco Use   Smoking status: Never   Smokeless tobacco: Never  Vaping Use   Vaping Use: Never used  Substance Use Topics   Alcohol use: No   Drug use: No    Allergies as of 10/14/2020 - Review Complete 10/14/2020  Allergen Reaction Noted   Nsaids Other (See Comments)  01/05/2020   Sulfa antibiotics Rash 08/24/2015    Physical Examination:  Constitutional: General:   Alert,  Well-developed, well-nourished, pleasant and cooperative in NAD BP 120/82   Pulse 97   Temp 98.2 F (36.8 C) (Oral)   Wt 165 lb 3.2 oz (74.9 kg)   BMI 27.49 kg/m   Respiratory: Normal respiratory effort  Gastrointestinal:  Soft, non-tender and non-distended without masses, hepatosplenomegaly or hernias noted.  No guarding or rebound tenderness.     Cardiac: No clubbing or edema.  No cyanosis. Normal posterior tibial pedal pulses noted.  Psych:  Alert and cooperative. Normal mood and affect.  Musculoskeletal:  Normal gait. Head normocephalic, atraumatic. Symmetrical without gross deformities. 5/5 Lower extremity strength bilaterally.  Skin: Warm. Intact without significant lesions or rashes. No jaundice.  Neck: Supple, trachea midline  Lymph: No cervical lymphadenopathy  Psych:  Alert and oriented x3, Alert and cooperative. Normal mood and affect.  Labs: CMP     Component Value Date/Time   NA 137 02/28/2020 0530   NA 140 03/29/2012 1120   K 3.8 02/28/2020 0530   K 3.7 03/29/2012 1120   CL 103 02/28/2020 0530   CL 110 (H) 03/29/2012 1120   CO2 25 02/28/2020 0530   CO2 20 (L) 03/29/2012 1120   GLUCOSE 109 (H) 02/28/2020 0530   GLUCOSE 70 03/29/2012 1120   BUN 15 02/28/2020 0530   BUN 17 03/29/2012 1120   CREATININE 0.80 04/25/2020 1106   CREATININE 0.74 03/29/2012 1120   CALCIUM 9.0 02/28/2020 0530   CALCIUM 8.9 03/29/2012 1120   PROT 6.9 02/27/2020 0852   PROT 7.5 03/29/2012 1120   ALBUMIN 4.2 02/27/2020 0852   ALBUMIN 4.1 03/29/2012 1120   AST 24 02/27/2020 0852   AST 25 03/29/2012 1120   ALT 25 02/27/2020 0852   ALT 29 03/29/2012 1120   ALKPHOS 80 02/27/2020 0852   ALKPHOS 124 03/29/2012 1120   BILITOT 0.9 02/27/2020 0852   BILITOT 0.3 03/29/2012 1120   GFRNONAA >60 02/28/2020 0530   GFRNONAA >60 03/29/2012 1120   GFRAA >60 01/14/2019 0106    GFRAA >60 03/29/2012 1120   Lab Results  Component Value Date   WBC 14.2 (H) 02/28/2020   HGB 13.3 02/28/2020   HCT 40.0 02/28/2020   MCV 95.0 02/28/2020   PLT 319 02/28/2020    Imaging Studies:   Assessment and Plan:   NDIDI NESBY is a 75 y.o. y/o female with diarrhea with urgency, with previous history of ulcerative colitis but not on any medications for this  Pt seems to have acute diarrhea with urgency over the last month.  I will rule out infectious etiology due to this  However, given her history of chronic changes on pathology, she likely has underlying symptoms as well, with possible acute exacerbation recently  Treatment for ulcerative colitis is indicated since she is not in histologic remission.  She has mild disease and therefore we will start that 5-ASA derivative at this time.  I will obtain baseline labs as well, including fecal calprotectin  Patient's alk phos was previously elevated with  CMP and March 2022 showing alk phos of 116 with a normal bilirubin.  February 2022 CMP also shows mildly elevated alk phos.  Will obtain repeat along with GGT   Dr April Larsen

## 2020-10-15 LAB — COMPREHENSIVE METABOLIC PANEL
ALT: 22 IU/L (ref 0–32)
AST: 18 IU/L (ref 0–40)
Albumin/Globulin Ratio: 2.1 (ref 1.2–2.2)
Albumin: 4.5 g/dL (ref 3.7–4.7)
Alkaline Phosphatase: 93 IU/L (ref 44–121)
BUN/Creatinine Ratio: 15 (ref 12–28)
BUN: 19 mg/dL (ref 8–27)
Bilirubin Total: 0.5 mg/dL (ref 0.0–1.2)
CO2: 20 mmol/L (ref 20–29)
Calcium: 10.3 mg/dL (ref 8.7–10.3)
Chloride: 102 mmol/L (ref 96–106)
Creatinine, Ser: 1.28 mg/dL — ABNORMAL HIGH (ref 0.57–1.00)
Globulin, Total: 2.1 g/dL (ref 1.5–4.5)
Glucose: 97 mg/dL (ref 65–99)
Potassium: 3.9 mmol/L (ref 3.5–5.2)
Sodium: 138 mmol/L (ref 134–144)
Total Protein: 6.6 g/dL (ref 6.0–8.5)
eGFR: 44 mL/min/{1.73_m2} — ABNORMAL LOW (ref >59)

## 2020-10-15 LAB — CBC
Hematocrit: 43.7 % (ref 34.0–46.6)
Hemoglobin: 15.3 g/dL (ref 11.1–15.9)
MCH: 32 pg (ref 26.6–33.0)
MCHC: 35 g/dL (ref 31.5–35.7)
MCV: 91 fL (ref 79–97)
Platelets: 364 10*3/uL (ref 150–450)
RBC: 4.78 x10E6/uL (ref 3.77–5.28)
RDW: 12.9 % (ref 11.7–15.4)
WBC: 11 10*3/uL — ABNORMAL HIGH (ref 3.4–10.8)

## 2020-10-15 LAB — C-REACTIVE PROTEIN: CRP: 1 mg/L (ref 0–10)

## 2020-10-15 LAB — GAMMA GT: GGT: 21 IU/L (ref 0–60)

## 2020-10-15 NOTE — Addendum Note (Signed)
Addended by: [name] on: 10/15/2020 12:10 PM   Modules accepted: Orders

## 2020-10-16 ENCOUNTER — Ambulatory Visit: Payer: PPO | Admitting: Urology

## 2020-10-16 ENCOUNTER — Other Ambulatory Visit: Payer: Self-pay | Admitting: Gastroenterology

## 2020-10-16 ENCOUNTER — Other Ambulatory Visit: Payer: Self-pay

## 2020-10-16 VITALS — BP 148/85 | HR 92

## 2020-10-16 DIAGNOSIS — R310 Gross hematuria: Secondary | ICD-10-CM

## 2020-10-16 DIAGNOSIS — R197 Diarrhea, unspecified: Secondary | ICD-10-CM | POA: Diagnosis not present

## 2020-10-16 DIAGNOSIS — K5100 Ulcerative (chronic) pancolitis without complications: Secondary | ICD-10-CM | POA: Diagnosis not present

## 2020-10-16 NOTE — Progress Notes (Signed)
   10/17/20  CC:  Chief Complaint  Patient presents with   Cysto    HPI: 75 year old female with a personal history of questionable gross hematuria who presents today for cystoscopic evaluation.  Last cystoscopy was canceled in setting of symptomatic UTI which was treated.  She reports that her urinary symptoms completely resolved with antibiotics for the past 3 days, she is had some lower abdominal pressure and pain which is unclear whether or not it was related to her bladder or possibly an active ulcerative colitis flare.  Urinalysis today is fairly unremarkable, minimal concern for ongoing infection.  NED. A&Ox3.   No respiratory distress   Abd soft, NT, ND Normal external genitalia with patent urethral meatus  Cystoscopy Procedure Note  Patient identification was confirmed, informed consent was obtained, and patient was prepped using Betadine solution.  Lidocaine jelly was administered per urethral meatus.    Procedure: - Flexible cystoscope introduced, without any difficulty.   - Thorough search of the bladder revealed:    normal urethral meatus    normal urothelium    no stones    no ulcers     no tumors    no urethral polyps    no trabeculation  - Ureteral orifices were normal in position and appearance.  Post-Procedure: - Patient tolerated the procedure well  Assessment/ Plan:  1. Gross hematuria Questionable history of microscopic hematuria  Fairly recent CT scan is unremarkable without any obvious upper tract pathology, no delayed imaging therefore the ureters were not completely evaluated  Cystoscopy today is unremarkable which is reassuring  Low threshold to repeat CT urogram if she has another episode of gross hematuria  Plan for f/u 1year with UA or sooner if hematuria recurs - Urinalysis, Complete   Hollice Espy, MD

## 2020-10-17 ENCOUNTER — Ambulatory Visit
Admission: RE | Admit: 2020-10-17 | Discharge: 2020-10-17 | Disposition: A | Discharge status: Home / Self Care | Payer: PPO | Source: Ambulatory Visit | Attending: Neurosurgery | Admitting: Neurosurgery

## 2020-10-17 ENCOUNTER — Other Ambulatory Visit: Payer: Self-pay | Admitting: Gastroenterology

## 2020-10-17 DIAGNOSIS — M5126 Other intervertebral disc displacement, lumbar region: Secondary | ICD-10-CM | POA: Diagnosis not present

## 2020-10-17 DIAGNOSIS — M48061 Spinal stenosis, lumbar region without neurogenic claudication: Secondary | ICD-10-CM

## 2020-10-17 DIAGNOSIS — M5136 Other intervertebral disc degeneration, lumbar region: Secondary | ICD-10-CM | POA: Diagnosis not present

## 2020-10-17 DIAGNOSIS — M4126 Other idiopathic scoliosis, lumbar region: Secondary | ICD-10-CM | POA: Diagnosis not present

## 2020-10-17 DIAGNOSIS — M4186 Other forms of scoliosis, lumbar region: Secondary | ICD-10-CM | POA: Diagnosis not present

## 2020-10-17 MED ORDER — IOPAMIDOL (ISOVUE-M 200) INJECTION 41%
15.0000 mL | Freq: Once | INTRAMUSCULAR | Status: AC
  Administered 2020-10-17: 15 mL via INTRATHECAL

## 2020-10-17 MED ORDER — DIAZEPAM 5 MG PO TABS
5.0000 mg | ORAL_TABLET | Freq: Once | ORAL | Status: AC
  Administered 2020-10-17: 5 mg via ORAL

## 2020-10-17 MED ORDER — MESALAMINE ER 0.375 G PO CP24: 1500.0000 mg | ORAL_CAPSULE | Freq: Every day | ORAL | 0 refills | Status: DC

## 2020-10-17 NOTE — Discharge Instructions (Addendum)
Myelogram Discharge Instructions  Go home and rest quietly as needed. You may resume normal activities; however, do not exert yourself strongly or do any heavy lifting today and tomorrow.   DO NOT drive today.    You may resume your normal diet and medications unless otherwise indicated. Drink lots of extra fluids today and tomorrow.   The incidence of headache, nausea, or vomiting is about 5% (one in 20 patients).  If you develop a headache, lie flat for 24 hours and drink plenty of fluids until the headache goes away.  Caffeinated beverages may be helpful. If when you get up you still have a headache when standing, go back to bed and force fluids for another 24 hours.   If you develop severe nausea and vomiting or a headache that does not go away with the flat bedrest after 48 hours, please call [phone].   Call your physician for a follow-up appointment.  The results of your myelogram will be sent directly to your physician by the following day.  If you have any questions or if complications develop after you arrive home, please call [phone].  Discharge instructions have been explained to the patient.  The patient, or the person responsible for the patient, fully understands these instructions.   Thank you for visiting our office today.    Quentin AFTER PROCEDURE TODAY

## 2020-10-18 DIAGNOSIS — L821 Other seborrheic keratosis: Secondary | ICD-10-CM | POA: Diagnosis not present

## 2020-10-18 DIAGNOSIS — D225 Melanocytic nevi of trunk: Secondary | ICD-10-CM | POA: Diagnosis not present

## 2020-10-18 DIAGNOSIS — B078 Other viral warts: Secondary | ICD-10-CM | POA: Diagnosis not present

## 2020-10-18 DIAGNOSIS — C44729 Squamous cell carcinoma of skin of left lower limb, including hip: Secondary | ICD-10-CM | POA: Diagnosis not present

## 2020-10-18 DIAGNOSIS — D2261 Melanocytic nevi of right upper limb, including shoulder: Secondary | ICD-10-CM | POA: Diagnosis not present

## 2020-10-18 DIAGNOSIS — D2271 Melanocytic nevi of right lower limb, including hip: Secondary | ICD-10-CM | POA: Diagnosis not present

## 2020-10-18 DIAGNOSIS — D485 Neoplasm of uncertain behavior of skin: Secondary | ICD-10-CM | POA: Diagnosis not present

## 2020-10-18 DIAGNOSIS — D2262 Melanocytic nevi of left upper limb, including shoulder: Secondary | ICD-10-CM | POA: Diagnosis not present

## 2020-10-18 DIAGNOSIS — D2272 Melanocytic nevi of left lower limb, including hip: Secondary | ICD-10-CM | POA: Diagnosis not present

## 2020-10-18 LAB — URINALYSIS, COMPLETE
Bilirubin, UA: NEGATIVE
Glucose, UA: NEGATIVE
Ketones, UA: NEGATIVE
Nitrite, UA: NEGATIVE
Protein,UA: NEGATIVE
Specific Gravity, UA: 1.025 (ref 1.005–1.030)
Urobilinogen, Ur: 1 mg/dL (ref 0.2–1.0)
pH, UA: 6.5 (ref 5.0–7.5)

## 2020-10-18 LAB — MICROSCOPIC EXAMINATION: Bacteria, UA: NONE SEEN

## 2020-10-21 LAB — GI PROFILE, STOOL, PCR

## 2020-10-21 LAB — CALPROTECTIN, FECAL: Calprotectin, Fecal: 61 ug/g (ref 0–120)

## 2020-10-31 DIAGNOSIS — H40003 Preglaucoma, unspecified, bilateral: Secondary | ICD-10-CM | POA: Diagnosis not present

## 2020-11-04 ENCOUNTER — Other Ambulatory Visit: Payer: Self-pay

## 2020-11-04 DIAGNOSIS — K5100 Ulcerative (chronic) pancolitis without complications: Secondary | ICD-10-CM

## 2020-11-05 LAB — COMPREHENSIVE METABOLIC PANEL
ALT: 20 IU/L (ref 0–32)
AST: 17 IU/L (ref 0–40)
Albumin/Globulin Ratio: 3.1 — ABNORMAL HIGH (ref 1.2–2.2)
Albumin: 4.7 g/dL (ref 3.7–4.7)
Alkaline Phosphatase: 84 IU/L (ref 44–121)
BUN/Creatinine Ratio: 29 — ABNORMAL HIGH (ref 12–28)
BUN: 24 mg/dL (ref 8–27)
Bilirubin Total: 0.2 mg/dL (ref 0.0–1.2)
CO2: 19 mmol/L — ABNORMAL LOW (ref 20–29)
Calcium: 10.4 mg/dL — ABNORMAL HIGH (ref 8.7–10.3)
Chloride: 107 mmol/L — ABNORMAL HIGH (ref 96–106)
Creatinine, Ser: 0.84 mg/dL (ref 0.57–1.00)
Globulin, Total: 1.5 g/dL (ref 1.5–4.5)
Glucose: 89 mg/dL (ref 65–99)
Potassium: 4 mmol/L (ref 3.5–5.2)
Sodium: 139 mmol/L (ref 134–144)
Total Protein: 6.2 g/dL (ref 6.0–8.5)
eGFR: 73 mL/min/{1.73_m2} (ref >59)

## 2020-11-05 LAB — CBC
Hematocrit: 43.1 % (ref 34.0–46.6)
Hemoglobin: 13.9 g/dL (ref 11.1–15.9)
MCH: 30.5 pg (ref 26.6–33.0)
MCHC: 32.3 g/dL (ref 31.5–35.7)
MCV: 95 fL (ref 79–97)
Platelets: 384 10*3/uL (ref 150–450)
RBC: 4.55 x10E6/uL (ref 3.77–5.28)
RDW: 12.8 % (ref 11.7–15.4)
WBC: 9.7 10*3/uL (ref 3.4–10.8)

## 2020-11-14 ENCOUNTER — Ambulatory Visit: Payer: PPO | Admitting: Gastroenterology

## 2020-11-14 ENCOUNTER — Other Ambulatory Visit: Payer: Self-pay

## 2020-11-14 ENCOUNTER — Encounter: Payer: Self-pay | Admitting: Gastroenterology

## 2020-11-14 VITALS — BP 128/83 | HR 99 | Temp 98.0°F | Ht 65.0 in | Wt 165.8 lb

## 2020-11-14 DIAGNOSIS — R197 Diarrhea, unspecified: Secondary | ICD-10-CM | POA: Diagnosis not present

## 2020-11-14 DIAGNOSIS — R32 Unspecified urinary incontinence: Secondary | ICD-10-CM

## 2020-11-14 DIAGNOSIS — K5100 Ulcerative (chronic) pancolitis without complications: Secondary | ICD-10-CM

## 2020-11-14 DIAGNOSIS — R159 Full incontinence of feces: Secondary | ICD-10-CM

## 2020-11-14 MED ORDER — COLESTIPOL HCL 1 G PO TABS: 2.0000 g | ORAL_TABLET | Freq: Two times a day (BID) | ORAL | 0 refills | Status: DC

## 2020-11-14 NOTE — Patient Instructions (Addendum)
You stated that you are taking Lialda at home as your picked up that prescription from your pharmacy. Please start taking 4 pills a day instead of 2 pills a day that you were previously taking  Please come back to the office to recheck labs in 2 weeks  Hours are Mon-1-4pm, Tues-Wed 8:30am-4pm, Thurs 8:30am-4pm  Schedule 4 week follow up  Referral has been placed for physical therapy for pelvic floor therapy

## 2020-11-14 NOTE — Progress Notes (Signed)
April Antigua, MD [address]  [place]  [place], [place] [address]  Main: [phone]  Fax: [phone]   Primary Care Physician: Rusty Aus, MD   Chief Complaint  Patient presents with   Follow-up    4 week f/u... Pt reports minimal improvement with Sx... Denies any new Sx or concerns     HPI: April Larsen is a 75 y.o. female here for follow-up of ulcerative colitis and diarrhea.  Patient was prescribed Lialda 2.4 g a day after last visit.  However, she called [place] back as she stated that the medication will be expensive and therefore, Apriso 1.5 g a day was sent to the pharmacy.  However, patient states she ended up picking up the Lialda anyway, and told the pharmacy to keep the please on hold.  She has been taking Lialda and continues to have loose stools, with incontinence.  She states she has previously tried colestipol 2 years ago and that seemed to have helped her diarrhea.  She has a history of cholecystectomy in the past.  No blood in stool.  She also reports intermittent urinary incontinence that is chronic as well.  No abdominal pain, nausea or vomiting.  Patient states she has to wear pads,  due to incontinence issues   ROS: All ROS reviewed and negative except as per HPI   Past Medical History:  Diagnosis Date   Actinic keratosis    Anemia    LAST HGB 15.2 ON 04-22-16   Anxiety    Arthritis    Cancer ([place]) 2016   skin   Cataract (lens) fragments in eye following cataract surgery, bilateral 2013   one eye done then 4 weeks later the other eye done.   Chronic ulcerative colitis, without complications ([place]) 6/0/7371   Diverticulitis    Esophagitis, reflux 10/04/2014   Family history of adverse reaction to anesthesia    SISTER HARD TO WAKE UP and nausea and vomiting.   Female stress incontinence 08/09/2013   Gastroesophageal reflux disease without esophagitis 08/06/2016   GERD (gastroesophageal reflux disease)    Headache    MIGRAINE    History of hiatal hernia    Incomplete emptying of bladder 10/29/2013   Migraine 07/26/2013   PONV (postoperative nausea and vomiting)    nausea, does not remember vomiting.   Postherpetic neuralgia    RIGHT 7TH.CRANIAL NERVE from shingles followed by Bell's palsy   Postmenopausal 11/09/2016   PVC (premature ventricular contraction)    Scoliosis    Scoliosis (and kyphoscoliosis), idiopathic 07/26/2013   Scoliosis of lumbosacral spine 01/18/2015   Squamous cell carcinoma of skin 03/13/2013   L clavicle    Squamous cell carcinoma of skin 03/10/2018   L med knee   Squamous cell carcinoma of skin 02/21/2019   R cheek    Squamous cell carcinoma of skin 06/01/2019   L mid lat pretibial - ED&C   Trochanteric bursitis 11/29/2014   Ulcerative colitis ([place])    Urethral prolapse 08/09/2013   Urge incontinence 08/09/2013    Past Surgical History:  Procedure Laterality Date   ABDOMINAL HYSTERECTOMY     BACK SURGERY     L3-L4 AND L3-S1 DECOMPRESSION   CARPAL TUNNEL RELEASE     CHOLECYSTECTOMY N/A 08/21/2016   Procedure: LAPAROSCOPIC CHOLECYSTECTOMY WITH INTRAOPERATIVE CHOLANGIOGRAM;  Surgeon: Leonie Green, MD;  Location: [facility] ORS;  Service: General;  Laterality: N/A;   COLON SURGERY     COLONOSCOPY  11/06/2005   COLONOSCOPY WITH  PROPOFOL N/A 07/29/2016   Procedure: COLONOSCOPY WITH PROPOFOL;  Surgeon: Manya Silvas, MD;  Location: [facility] ENDOSCOPY;  Service: Endoscopy;  Laterality: N/A;   COLONOSCOPY WITH PROPOFOL N/A 08/22/2020   Procedure: COLONOSCOPY WITH PROPOFOL;  Surgeon: Virgel Manifold, MD;  Location: [facility] ENDOSCOPY;  Service: Endoscopy;  Laterality: N/A;   ESOPHAGOGASTRODUODENOSCOPY  11/06/2005   ESOPHAGOGASTRODUODENOSCOPY (EGD) WITH PROPOFOL N/A 07/29/2016   Procedure: ESOPHAGOGASTRODUODENOSCOPY (EGD) WITH PROPOFOL;  Surgeon: Manya Silvas, MD;  Location: [facility] ENDOSCOPY;  Service: Endoscopy;  Laterality: N/A;   ESOPHAGOGASTRODUODENOSCOPY (EGD) WITH PROPOFOL N/A 02/07/2020    Procedure: ESOPHAGOGASTRODUODENOSCOPY (EGD) WITH PROPOFOL;  Surgeon: Virgel Manifold, MD;  Location: [facility] ENDOSCOPY;  Service: Endoscopy;  Laterality: N/A;   LAPAROSCOPIC LOW ANTERIOR RESECTION  11/24/2016   Procedure: LAPAROSCOPIC LOW ANTERIOR RESECTION;  Surgeon: Jules Husbands, MD;  Location: [facility] ORS;  Service: General;;   XI ROBOTIC ASSISTED PARAESOPHAGEAL HERNIA REPAIR N/A 02/27/2020   Procedure: XI ROBOTIC ASSISTED PARAESOPHAGEAL HERNIA REPAIR;  Surgeon: Jules Husbands, MD;  Location: [facility] ORS;  Service: General;  Laterality: N/A;    Prior to Admission medications   Medication Sig Start Date End Date Taking? Authorizing Provider  acetaminophen (TYLENOL) 500 MG tablet Take 1,000 mg by mouth every 6 (six) hours as needed for moderate pain or headache.    Yes [provider]  B Complex-C (B-COMPLEX WITH VITAMIN C) tablet Take 1 tablet by mouth daily.   Yes [provider]  Calcium Carbonate-Vitamin D (CALCIUM 600+D PO) Take 1 tablet by mouth 2 (two) times daily.   Yes [provider]  Cholecalciferol (VITAMIN D3) 50 MCG (2000 UT) TABS Take 2,000 Units by mouth daily.   Yes [provider]  colestipol (COLESTID) 1 g tablet Take 2 tablets (2 g total) by mouth 2 (two) times daily. 11/14/20 02/12/21 Yes Virgel Manifold, MD  DULoxetine (CYMBALTA) 60 MG capsule Take 60 mg by mouth at bedtime.  06/17/15  Yes [provider]  fluorouracil (EFUDEX) 5 % cream Apply topically 2 (two) times daily. X 7 days 07/24/20  Yes Ralene Bathe, MD  furosemide (LASIX) 20 MG tablet Take 1 tablet by mouth daily as needed. 08/02/20 08/02/21 Yes [provider]  mesalamine (APRISO) 0.375 g 24 hr capsule Take 4 capsules (1.5 g total) by mouth daily. 10/17/20 01/15/21 Yes Virgel Manifold, MD  omeprazole (PRILOSEC) 20 MG capsule Take 20 mg by mouth 2 (two) times daily. 07/29/15  Yes [provider]  pantoprazole (PROTONIX) 40 MG tablet Take 40 mg by  mouth daily.   Yes [provider]  triamterene-hydrochlorothiazide (DYAZIDE) 37.5-25 MG capsule Take 1 capsule by mouth every morning. 09/04/20  Yes [provider]  buPROPion (WELLBUTRIN XL) 150 MG 24 hr tablet Take 150 mg by mouth daily. 12/08/18 10/01/20  [provider]    Family History  Problem Relation Age of Onset   Breast cancer Sister 75   Breast cancer Paternal Aunt 50   Breast cancer Cousin        1st paternal cousin   Bladder Cancer Neg Hx    Prostate cancer Neg Hx    Kidney cancer Neg Hx      Social History   Tobacco Use   Smoking status: Never   Smokeless tobacco: Never  Vaping Use   Vaping Use: Never used  Substance Use Topics   Alcohol use: No   Drug use: No    Allergies as of 11/14/2020 - Review Complete 11/14/2020  Allergen Reaction Noted   Nsaids Other (See Comments) 01/05/2020   Sulfa antibiotics Rash 08/24/2015    Physical Examination:  Constitutional: General:   Alert,  Well-developed, well-nourished, pleasant and cooperative in NAD BP 128/83   Pulse 99   Temp 98 F (36.7 C) (Oral)   Ht 5' 5"  (1.651 m)   Wt 165 lb 12.8 oz (75.2 kg)   BMI 27.59 kg/m   Respiratory: Normal respiratory effort  Gastrointestinal:  Soft, non-tender and non-distended without masses, hepatosplenomegaly or hernias noted.  No guarding or rebound tenderness.     Cardiac: No clubbing or edema.  No cyanosis. Normal posterior tibial pedal pulses noted.  Psych:  Alert and cooperative. Normal mood and affect.  Musculoskeletal:  Normal gait. Head normocephalic, atraumatic. Symmetrical without gross deformities. 5/5 Lower extremity strength bilaterally.  Skin: Warm. Intact without significant lesions or rashes. No jaundice.  Neck: Supple, trachea midline  Lymph: No cervical lymphadenopathy  Psych:  Alert and oriented x3, Alert and cooperative. Normal mood and affect.  Labs: CMP     Component Value Date/Time   NA 139 11/04/2020 1609   NA  140 03/29/2012 1120   K 4.0 11/04/2020 1609   K 3.7 03/29/2012 1120   CL 107 (H) 11/04/2020 1609   CL 110 (H) 03/29/2012 1120   CO2 19 (L) 11/04/2020 1609   CO2 20 (L) 03/29/2012 1120   GLUCOSE 89 11/04/2020 1609   GLUCOSE 109 (H) 02/28/2020 0530   GLUCOSE 70 03/29/2012 1120   BUN 24 11/04/2020 1609   BUN 17 03/29/2012 1120   CREATININE 0.84 11/04/2020 1609   CREATININE 0.74 03/29/2012 1120   CALCIUM 10.4 (H) 11/04/2020 1609   CALCIUM 8.9 03/29/2012 1120   PROT 6.2 11/04/2020 1609   PROT 7.5 03/29/2012 1120   ALBUMIN 4.7 11/04/2020 1609   ALBUMIN 4.1 03/29/2012 1120   AST 17 11/04/2020 1609   AST 25 03/29/2012 1120   ALT 20 11/04/2020 1609   ALT 29 03/29/2012 1120   ALKPHOS 84 11/04/2020 1609   ALKPHOS 124 03/29/2012 1120   BILITOT 0.2 11/04/2020 1609   BILITOT 0.3 03/29/2012 1120   GFRNONAA >60 02/28/2020 0530   GFRNONAA >60 03/29/2012 1120   GFRAA >60 01/14/2019 0106   GFRAA >60 03/29/2012 1120   Lab Results  Component Value Date   WBC 9.7 11/04/2020   HGB 13.9 11/04/2020   HCT 43.1 11/04/2020   MCV 95 11/04/2020   PLT 384 11/04/2020    Imaging Studies:   Assessment and Plan:   April Larsen is a 75 y.o. y/o female here for follow-up of ulcerative colitis and has ongoing diarrhea despite taking Lialda 2.4 g a day  Patient states she has been off Lialda at home, to last her until October.  I have advised her to start taking 4 pills of the Lialda, which would equal 4.8 g a day given that she is still having diarrhea.  Written instructions for this were also given to her.  The Apriso that was previously sent to the pharmacy on 10/17/2020, is on hold at the pharmacy as patient ended up picking up Lialda instead of Apriso as mentioned in HPI.  After she finishes her Lialda pills that she has at home, we will likely continue with Apriso in the future, given that it costs her less as per the patient.  Labs in 2 weeks given that we are increasing her dose.  CBC and CMP  ordered  Most recent CMP shows improvement  in creatinine, and was done at least 2 weeks after she was started on Lialda.  Most recent CBC did not show anemia, normal platelets  Clinic follow-up in 4 weeks  I will also start colestipol given her postcholecystectomy status and this may also be contributing to her diarrhea  Patient likely also has pelvic floor muscle weakness given that she also has urinary incontinence that is chronic.  She will benefit from pelvic floor physical therapy.  Patient is agreeable to referral for this, will refer.    Dr April Larsen

## 2020-11-15 NOTE — Addendum Note (Signed)
Addended by: Lurlean Nanny on: 11/15/2020 08:55 AM   Modules accepted: Orders

## 2020-11-20 DIAGNOSIS — M419 Scoliosis, unspecified: Secondary | ICD-10-CM | POA: Diagnosis not present

## 2020-11-27 ENCOUNTER — Ambulatory Visit: Payer: PPO | Admitting: Dermatology

## 2020-11-27 DIAGNOSIS — C44729 Squamous cell carcinoma of skin of left lower limb, including hip: Secondary | ICD-10-CM | POA: Diagnosis not present

## 2020-12-03 ENCOUNTER — Other Ambulatory Visit: Payer: Self-pay

## 2020-12-03 DIAGNOSIS — K5100 Ulcerative (chronic) pancolitis without complications: Secondary | ICD-10-CM | POA: Diagnosis not present

## 2020-12-04 LAB — COMPREHENSIVE METABOLIC PANEL
ALT: 23 IU/L (ref 0–32)
AST: 22 IU/L (ref 0–40)
Albumin/Globulin Ratio: 1.8 (ref 1.2–2.2)
Albumin: 4.6 g/dL (ref 3.7–4.7)
Alkaline Phosphatase: 110 IU/L (ref 44–121)
BUN/Creatinine Ratio: 20 (ref 12–28)
BUN: 22 mg/dL (ref 8–27)
Bilirubin Total: 0.6 mg/dL (ref 0.0–1.2)
CO2: 21 mmol/L (ref 20–29)
Calcium: 10.7 mg/dL — ABNORMAL HIGH (ref 8.7–10.3)
Chloride: 97 mmol/L (ref 96–106)
Creatinine, Ser: 1.08 mg/dL — ABNORMAL HIGH (ref 0.57–1.00)
Globulin, Total: 2.6 g/dL (ref 1.5–4.5)
Glucose: 106 mg/dL — ABNORMAL HIGH (ref 65–99)
Potassium: 4.1 mmol/L (ref 3.5–5.2)
Sodium: 135 mmol/L (ref 134–144)
Total Protein: 7.2 g/dL (ref 6.0–8.5)
eGFR: 54 mL/min/{1.73_m2} — ABNORMAL LOW (ref >59)

## 2020-12-04 LAB — CBC
Hematocrit: 45.2 % (ref 34.0–46.6)
Hemoglobin: 15.5 g/dL (ref 11.1–15.9)
MCH: 31.8 pg (ref 26.6–33.0)
MCHC: 34.3 g/dL (ref 31.5–35.7)
MCV: 93 fL (ref 79–97)
Platelets: 412 10*3/uL (ref 150–450)
RBC: 4.87 x10E6/uL (ref 3.77–5.28)
RDW: 12.5 % (ref 11.7–15.4)
WBC: 8.7 10*3/uL (ref 3.4–10.8)

## 2020-12-10 DIAGNOSIS — E559 Vitamin D deficiency, unspecified: Secondary | ICD-10-CM | POA: Diagnosis not present

## 2020-12-10 DIAGNOSIS — E782 Mixed hyperlipidemia: Secondary | ICD-10-CM | POA: Diagnosis not present

## 2020-12-16 DIAGNOSIS — R0602 Shortness of breath: Secondary | ICD-10-CM | POA: Diagnosis not present

## 2020-12-16 DIAGNOSIS — R079 Chest pain, unspecified: Secondary | ICD-10-CM | POA: Diagnosis not present

## 2020-12-17 DIAGNOSIS — Z0000 Encounter for general adult medical examination without abnormal findings: Secondary | ICD-10-CM | POA: Diagnosis not present

## 2020-12-17 DIAGNOSIS — M4147 Neuromuscular scoliosis, lumbosacral region: Secondary | ICD-10-CM | POA: Diagnosis not present

## 2020-12-17 DIAGNOSIS — K5190 Ulcerative colitis, unspecified, without complications: Secondary | ICD-10-CM | POA: Diagnosis not present

## 2020-12-17 DIAGNOSIS — M76899 Other specified enthesopathies of unspecified lower limb, excluding foot: Secondary | ICD-10-CM | POA: Diagnosis not present

## 2020-12-17 DIAGNOSIS — E782 Mixed hyperlipidemia: Secondary | ICD-10-CM | POA: Diagnosis not present

## 2020-12-17 DIAGNOSIS — Z23 Encounter for immunization: Secondary | ICD-10-CM | POA: Diagnosis not present

## 2020-12-18 ENCOUNTER — Other Ambulatory Visit: Payer: Self-pay | Admitting: Gastroenterology

## 2020-12-18 ENCOUNTER — Encounter: Payer: Self-pay | Admitting: Gastroenterology

## 2020-12-18 ENCOUNTER — Other Ambulatory Visit: Payer: Self-pay

## 2020-12-18 ENCOUNTER — Ambulatory Visit: Payer: PPO | Admitting: Gastroenterology

## 2020-12-18 VITALS — BP 127/72 | HR 97 | Temp 98.7°F | Wt 169.2 lb

## 2020-12-18 DIAGNOSIS — K5190 Ulcerative colitis, unspecified, without complications: Secondary | ICD-10-CM

## 2020-12-18 DIAGNOSIS — R195 Other fecal abnormalities: Secondary | ICD-10-CM | POA: Diagnosis not present

## 2020-12-18 MED ORDER — COLESTIPOL HCL 1 G PO TABS: 3.0000 g | ORAL_TABLET | Freq: Two times a day (BID) | ORAL | 0 refills | Status: DC

## 2020-12-19 LAB — SEDIMENTATION RATE: Sed Rate: 10 mm/hr (ref 0–40)

## 2020-12-19 LAB — C-REACTIVE PROTEIN: CRP: <1 mg/L (ref 0–10)

## 2020-12-19 NOTE — Progress Notes (Signed)
[name], MD [address]  [place]  [place], [place] [address]  Main: [phone]  Fax: [phone]   Primary Care Physician: Rusty Aus, MD   Chief complaint: Diarrhea  HPI: April Larsen is a 75 y.o. female here for follow-up of ulcerative colitis.  Patient is taking Lialda 2 pills twice a day for a total of 4.8 g a day.  After she completes this, she plans on picking up the Apriso in her pharmacy.  Lialda was initially prescribed but was too expensive and patient called [place] back in so that we change it to Apriso since it was better covered by her insurance, but patient had decided to pick up the Lialda prior to picking up the [place].  And she states the pharmacy is holding the [place] for her.  She reports improvement in abdominal cramping since being on Lialda, but is still having loose stools.  States this improved with colestipol, and she is taking 2 pills twice a day of this, for total of 4 g a day.  She also reports stress urinary incontinence intermittently  No blood in stool, no nausea or vomiting.  Previous History:  Patient underwent colonoscopy on Aug 22, 2020   Impression:            - Erythematous mucosa in the ascending colon and in                        the cecum. Biopsied.                        - Diverticulosis in the sigmoid colon.                        - Patent surgical anastomosis, characterized by                        erythema.                        - The examination was otherwise normal.                        - The rectum, sigmoid colon, descending colon,                        transverse colon and terminal ileum are normal.                        Biopsied.                        - Non-bleeding internal hemorrhoids.   DIAGNOSIS:  A. TERMINAL ILEUM; COLD BIOPSY:  - ENTERIC MUCOSA WITH NORMAL VILLOUS ARCHITECTURE AND NO SIGNIFICANT  HISTOPATHOLOGIC CHANGE.  - SINGLE FRAGMENT OF COLONIC MUCOSA WITH NO SIGNIFICANT  HISTOPATHOLOGIC  CHANGE.  - NEGATIVE FOR ACTIVE ILEITIS/COLITIS.  - NEGATIVE FOR GRANULOMA, DYSPLASIA, AND MALIGNANCY.   B. COLON, CECUM AND ASCENDING; COLD BIOPSY:  - CHRONIC COLITIS WITH MILD ACTIVITY (FOCAL CRYPTITIS AND SUPERFICIAL  ACTIVE MUCOSAL INFLAMMATION).  - NEGATIVE FOR GRANULOMA, DYSPLASIA, AND MALIGNANCY.   C. COLON, TRANSVERSE AND DESCENDING; COLD BIOPSY:  - CHRONIC COLITIS WITHOUT ACTIVITY.  - NEGATIVE FOR GRANULOMA, DYSPLASIA, AND MALIGNANCY.   D. COLON, RECTOSIGMOID; COLD BIOPSY:  - CHRONIC PROCTITIS WITHOUT  ACTIVITY.  - NEGATIVE FOR GRANULOMA, DYSPLASIA, AND MALIGNANCY.    Previous history: She was previously followed by [facility] GI, Dr. Vira Agar, and has a history of ulcerative colitis.  However, she has not been on any medications for years.  Over the years, patient has had some urinary  incontinence, and reports urgency with her bowel movements.  However, she also reports constipation on some days, followed by days where she has a loose bowel movement.  No blood in stool.  No weight loss.  No abdominal pain.  No nausea or vomiting.   Previous history: Patient has a history of chronic ulcerative colitis documented in her chart.  On further review of her previous documentation, she was seen by [facility] GI for this but the history is somewhat unclear.  She has not been on any UC meds for years.I am able to see a pathology report from 2013 in her chart that shows mild chronic inactive colitis in all the biopsy specimens.  Procedure report not available.  She had a colonoscopy in 2018 that was normal with biopsies showing unremarkable colonic mucosa throughout.  She also had laparoscopic sigmoid resection for diverticulitis in 2018.   As per February 2018 note by Denice Paradise, Dr. Percell Boston PA, "Patient was hospitalized for C-diff colitis in 2010. Her colonoscopy 02/08/09 showing diverticulosis in the sigmoid colon, descending colon, transverse colon, internal  hemorrhoids, and colon otherwise appeared normal. Biopsies of the ileocecal valve showed mild active colitis with architectural features of chronicity.The patient was having a lot of diarrhea at the time, quite symptomatic, with elevated ESR/CRP and Dr. Vira Agar felt the colonoscopy was not that impressive. She was given Entocort."  Assessment plan on that same note advises to stop the Entocort.  ?So essentially patient was on Entocort without a clear diagnosis from 2010 to 2018 ROS: All ROS reviewed and negative except as per HPI   Past Medical History:  Diagnosis Date   Actinic keratosis    Anemia    LAST HGB 15.2 ON 04-22-16   Anxiety    Arthritis    Cancer ([place]) 2016   skin   Cataract (lens) fragments in eye following cataract surgery, bilateral 2013   one eye done then 4 weeks later the other eye done.   Chronic ulcerative colitis, without complications ([place]) 05/02/4495   Diverticulitis    Esophagitis, reflux 10/04/2014   Family history of adverse reaction to anesthesia    SISTER HARD TO WAKE UP and nausea and vomiting.   Female stress incontinence 08/09/2013   Gastroesophageal reflux disease without esophagitis 08/06/2016   GERD (gastroesophageal reflux disease)    Headache    MIGRAINE   History of hiatal hernia    Incomplete emptying of bladder 10/29/2013   Migraine 07/26/2013   PONV (postoperative nausea and vomiting)    nausea, does not remember vomiting.   Postherpetic neuralgia    RIGHT 7TH.CRANIAL NERVE from shingles followed by Bell's palsy   Postmenopausal 11/09/2016   PVC (premature ventricular contraction)    Scoliosis    Scoliosis (and kyphoscoliosis), idiopathic 07/26/2013   Scoliosis of lumbosacral spine 01/18/2015   Squamous cell carcinoma of skin 03/13/2013   L clavicle    Squamous cell carcinoma of skin 03/10/2018   L med knee   Squamous cell carcinoma of skin 02/21/2019   R cheek    Squamous cell carcinoma of skin 06/01/2019   L mid lat pretibial - ED&C    Trochanteric bursitis 11/29/2014   Ulcerative colitis (  [place])    Urethral prolapse 08/09/2013   Urge incontinence 08/09/2013    Past Surgical History:  Procedure Laterality Date   ABDOMINAL HYSTERECTOMY     BACK SURGERY     L3-L4 AND L3-S1 DECOMPRESSION   CARPAL TUNNEL RELEASE     CHOLECYSTECTOMY N/A 08/21/2016   Procedure: LAPAROSCOPIC CHOLECYSTECTOMY WITH INTRAOPERATIVE CHOLANGIOGRAM;  Surgeon: Leonie Green, MD;  Location: [facility] ORS;  Service: General;  Laterality: N/A;   COLON SURGERY     COLONOSCOPY  11/06/2005   COLONOSCOPY WITH PROPOFOL N/A 07/29/2016   Procedure: COLONOSCOPY WITH PROPOFOL;  Surgeon: Manya Silvas, MD;  Location: [facility] ENDOSCOPY;  Service: Endoscopy;  Laterality: N/A;   COLONOSCOPY WITH PROPOFOL N/A 08/22/2020   Procedure: COLONOSCOPY WITH PROPOFOL;  Surgeon: Virgel Manifold, MD;  Location: [facility] ENDOSCOPY;  Service: Endoscopy;  Laterality: N/A;   ESOPHAGOGASTRODUODENOSCOPY  11/06/2005   ESOPHAGOGASTRODUODENOSCOPY (EGD) WITH PROPOFOL N/A 07/29/2016   Procedure: ESOPHAGOGASTRODUODENOSCOPY (EGD) WITH PROPOFOL;  Surgeon: Manya Silvas, MD;  Location: [facility] ENDOSCOPY;  Service: Endoscopy;  Laterality: N/A;   ESOPHAGOGASTRODUODENOSCOPY (EGD) WITH PROPOFOL N/A 02/07/2020   Procedure: ESOPHAGOGASTRODUODENOSCOPY (EGD) WITH PROPOFOL;  Surgeon: Virgel Manifold, MD;  Location: [facility] ENDOSCOPY;  Service: Endoscopy;  Laterality: N/A;   LAPAROSCOPIC LOW ANTERIOR RESECTION  11/24/2016   Procedure: LAPAROSCOPIC LOW ANTERIOR RESECTION;  Surgeon: Jules Husbands, MD;  Location: [facility] ORS;  Service: General;;   XI ROBOTIC ASSISTED PARAESOPHAGEAL HERNIA REPAIR N/A 02/27/2020   Procedure: XI ROBOTIC ASSISTED PARAESOPHAGEAL HERNIA REPAIR;  Surgeon: Jules Husbands, MD;  Location: [facility] ORS;  Service: General;  Laterality: N/A;    Prior to Admission medications   Medication Sig Start Date End Date Taking? Authorizing Provider  acetaminophen (TYLENOL) 500 MG tablet Take 1,000 mg by  mouth every 6 (six) hours as needed for moderate pain or headache.    Yes [provider]  B Complex-C (B-COMPLEX WITH VITAMIN C) tablet Take 1 tablet by mouth daily.   Yes [provider]  Calcium Carbonate-Vitamin D (CALCIUM 600+D PO) Take 1 tablet by mouth 2 (two) times daily.   Yes [provider]  Cholecalciferol (VITAMIN D3) 50 MCG (2000 UT) TABS Take 2,000 Units by mouth daily.   Yes [provider]  DULoxetine (CYMBALTA) 60 MG capsule Take 60 mg by mouth at bedtime.  06/17/15  Yes [provider]  fluorouracil (EFUDEX) 5 % cream Apply topically 2 (two) times daily. X 7 days 07/24/20  Yes Ralene Bathe, MD  furosemide (LASIX) 20 MG tablet Take 1 tablet by mouth daily as needed. 08/02/20 08/02/21 Yes [provider]  mesalamine (APRISO) 0.375 g 24 hr capsule Take 4 capsules (1.5 g total) by mouth daily. 10/17/20 01/15/21 Yes [name] B, MD  pantoprazole (PROTONIX) 40 MG tablet Take 40 mg by mouth daily.   Yes [provider]  triamterene-hydrochlorothiazide (DYAZIDE) 37.5-25 MG capsule Take 1 capsule by mouth every morning. 09/04/20  Yes [provider]  buPROPion (WELLBUTRIN XL) 150 MG 24 hr tablet Take 150 mg by mouth daily. 12/08/18 10/01/20  [provider]  colestipol (COLESTID) 1 g tablet Take 3 tablets (3 g total) by mouth 2 (two) times daily. 12/18/20 03/18/21  Virgel Manifold, MD    Family History  Problem Relation Age of Onset   Breast cancer Sister 49   Breast cancer Paternal Aunt 60   Breast cancer Cousin        1st paternal cousin   Bladder Cancer Neg Hx  Prostate cancer Neg Hx    Kidney cancer Neg Hx      Social History   Tobacco Use   Smoking status: Never   Smokeless tobacco: Never  Vaping Use   Vaping Use: Never used  Substance Use Topics   Alcohol use: No   Drug use: No    Allergies as of 12/18/2020 - Review Complete 12/18/2020  Allergen Reaction Noted   Nsaids Other  (See Comments) 01/05/2020   Sulfa antibiotics Rash 08/24/2015    Physical Examination:  Constitutional: General:   Alert,  Well-developed, well-nourished, pleasant and cooperative in NAD BP 127/72   Pulse 97   Temp 98.7 F (37.1 C) (Oral)   Wt 169 lb 3.2 oz (76.7 kg)   BMI 28.16 kg/m   Respiratory: Normal respiratory effort  Gastrointestinal:  Soft, non-tender and non-distended without masses, hepatosplenomegaly or hernias noted.  No guarding or rebound tenderness.     Cardiac: No clubbing or edema.  No cyanosis. Normal posterior tibial pedal pulses noted.  Psych:  Alert and cooperative. Normal mood and affect.  Musculoskeletal:  Normal gait. Head normocephalic, atraumatic. Symmetrical without gross deformities. 5/5 Lower extremity strength bilaterally.  Skin: Warm. Intact without significant lesions or rashes. No jaundice.  Neck: Supple, trachea midline  Lymph: No cervical lymphadenopathy  Psych:  Alert and oriented x3, Alert and cooperative. Normal mood and affect.  Labs: CMP     Component Value Date/Time   NA 135 12/03/2020 1150   NA 140 03/29/2012 1120   K 4.1 12/03/2020 1150   K 3.7 03/29/2012 1120   CL 97 12/03/2020 1150   CL 110 (H) 03/29/2012 1120   CO2 21 12/03/2020 1150   CO2 20 (L) 03/29/2012 1120   GLUCOSE 106 (H) 12/03/2020 1150   GLUCOSE 109 (H) 02/28/2020 0530   GLUCOSE 70 03/29/2012 1120   BUN 22 12/03/2020 1150   BUN 17 03/29/2012 1120   CREATININE 1.08 (H) 12/03/2020 1150   CREATININE 0.74 03/29/2012 1120   CALCIUM 10.7 (H) 12/03/2020 1150   CALCIUM 8.9 03/29/2012 1120   PROT 7.2 12/03/2020 1150   PROT 7.5 03/29/2012 1120   ALBUMIN 4.6 12/03/2020 1150   ALBUMIN 4.1 03/29/2012 1120   AST 22 12/03/2020 1150   AST 25 03/29/2012 1120   ALT 23 12/03/2020 1150   ALT 29 03/29/2012 1120   ALKPHOS 110 12/03/2020 1150   ALKPHOS 124 03/29/2012 1120   BILITOT 0.6 12/03/2020 1150   BILITOT 0.3 03/29/2012 1120   GFRNONAA >60 02/28/2020 0530    GFRNONAA >60 03/29/2012 1120   GFRAA >60 01/14/2019 0106   GFRAA >60 03/29/2012 1120   Lab Results  Component Value Date   WBC 8.7 12/03/2020   HGB 15.5 12/03/2020   HCT 45.2 12/03/2020   MCV 93 12/03/2020   PLT 412 12/03/2020    Imaging Studies:   Assessment and Plan:   April Larsen is a 75 y.o. y/o female with previous history of ulcerative colitis, previously followed with [facility] and received Entocort for years at their clinic, and then was off medications, and started having loose stools and was seen in our clinic, with her most recent colonoscopy showing chronic colitis with mild activity   Given patient's mild disease on endoscopy and histology, her loose stools are likely from a combination of pelvic floor dysfunction given her stress urinary incontinence as well, and postcholecystectomy status  I will check inflammatory markers, ESR, CRP, and fecal calprotectin at this time.  Fecal  calprotectin was borderline on last check at 61 in July 2022.  If these levels are normal, it would suggest improvement in her mild ulcerative colitis.  Will increase colestipol dose to 3 g twice a day, for total of 6 g daily.  Patient agreeable to pelvic floor physical therapy referral  Patient advised to ensure that she has the Apriso at the pharmacy, to avoid a medication gap between the Lialda and Apriso, and if there is any problems receiving the Apriso, to call us and she verbalized understanding  Pt does report noticing some hair loss since starting lialda. If this continues or worsens especially when starts apriso pt advised to call us. Alopecia is reported to be one of the adverse effects from mesalamine. However, given that she has mild disease that can likely be controlled with 5-ASA as opposed to step up therapy which could entail more adverse effects than hair loss, benefits of continuing this medication at this time would outweigh risks  Close follow-up in clinic to  reassess symptoms   Dr [name]

## 2020-12-19 NOTE — Progress Notes (Signed)
12/20/2020 9:06 AM   April Larsen 1945/11/13 [id number]  Referring provider: Rusty Aus, MD [facility] [place],  [place] [address]  Chief Complaint  Patient presents with   Hematuria   Urological history: 1. High risk hematuria -Non-smoker -contrast CT 03/2020 - NED -cysto 09/2020 - NED -reports of gross hematuria -UA negative for micro heme  2. Urge incontinence -contributing factors of age, vaginal atrophy, depression, diuretics and dementia   HPI: April Larsen is a 75 y.o. female who presents today for blood in the urine - possible UTI.  She experienced gross heme yesterday or the day before that cleared as the day progressed.  She had some left sided cramping associated with the gross heme. Patient denies any modifying or aggravating factors.  Patient denies any dysuria or suprapubic/flank pain.  Patient denies any fevers, chills, nausea or vomiting.    UA clear.     PMH: Past Medical History:  Diagnosis Date   Actinic keratosis    Anemia    LAST HGB 15.2 ON 04-22-16   Anxiety    Arthritis    Cancer ([place]) 2016   skin   Cataract (lens) fragments in eye following cataract surgery, bilateral 2013   one eye done then 4 weeks later the other eye done.   Chronic ulcerative colitis, without complications ([place]) 04/05/107   Diverticulitis    Esophagitis, reflux 10/04/2014   Family history of adverse reaction to anesthesia    SISTER HARD TO WAKE UP and nausea and vomiting.   Female stress incontinence 08/09/2013   Gastroesophageal reflux disease without esophagitis 08/06/2016   GERD (gastroesophageal reflux disease)    Headache    MIGRAINE   History of hiatal hernia    Incomplete emptying of bladder 10/29/2013   Migraine 07/26/2013   PONV (postoperative nausea and vomiting)    nausea, does not remember vomiting.   Postherpetic neuralgia    RIGHT 7TH.CRANIAL NERVE from shingles followed by Bell's palsy    Postmenopausal 11/09/2016   PVC (premature ventricular contraction)    Scoliosis    Scoliosis (and kyphoscoliosis), idiopathic 07/26/2013   Scoliosis of lumbosacral spine 01/18/2015   Squamous cell carcinoma of skin 03/13/2013   L clavicle    Squamous cell carcinoma of skin 03/10/2018   L med knee   Squamous cell carcinoma of skin 02/21/2019   R cheek    Squamous cell carcinoma of skin 06/01/2019   L mid lat pretibial - ED&C   Trochanteric bursitis 11/29/2014   Ulcerative colitis ([place])    Urethral prolapse 08/09/2013   Urge incontinence 08/09/2013    Surgical History: Past Surgical History:  Procedure Laterality Date   ABDOMINAL HYSTERECTOMY     BACK SURGERY     L3-L4 AND L3-S1 DECOMPRESSION   CARPAL TUNNEL RELEASE     CHOLECYSTECTOMY N/A 08/21/2016   Procedure: LAPAROSCOPIC CHOLECYSTECTOMY WITH INTRAOPERATIVE CHOLANGIOGRAM;  Surgeon: Leonie Green, MD;  Location: [facility] ORS;  Service: General;  Laterality: N/A;   COLON SURGERY     COLONOSCOPY  11/06/2005   COLONOSCOPY WITH PROPOFOL N/A 07/29/2016   Procedure: COLONOSCOPY WITH PROPOFOL;  Surgeon: Manya Silvas, MD;  Location: [facility] ENDOSCOPY;  Service: Endoscopy;  Laterality: N/A;   COLONOSCOPY WITH PROPOFOL N/A 08/22/2020   Procedure: COLONOSCOPY WITH PROPOFOL;  Surgeon: Virgel Manifold, MD;  Location: [facility] ENDOSCOPY;  Service: Endoscopy;  Laterality: N/A;   ESOPHAGOGASTRODUODENOSCOPY  11/06/2005   ESOPHAGOGASTRODUODENOSCOPY (EGD) WITH PROPOFOL N/A 07/29/2016  Procedure: ESOPHAGOGASTRODUODENOSCOPY (EGD) WITH PROPOFOL;  Surgeon: Manya Silvas, MD;  Location: [facility] ENDOSCOPY;  Service: Endoscopy;  Laterality: N/A;   ESOPHAGOGASTRODUODENOSCOPY (EGD) WITH PROPOFOL N/A 02/07/2020   Procedure: ESOPHAGOGASTRODUODENOSCOPY (EGD) WITH PROPOFOL;  Surgeon: Virgel Manifold, MD;  Location: [facility] ENDOSCOPY;  Service: Endoscopy;  Laterality: N/A;   LAPAROSCOPIC LOW ANTERIOR RESECTION  11/24/2016   Procedure: LAPAROSCOPIC LOW ANTERIOR  RESECTION;  Surgeon: Jules Husbands, MD;  Location: [facility] ORS;  Service: General;;   XI ROBOTIC ASSISTED PARAESOPHAGEAL HERNIA REPAIR N/A 02/27/2020   Procedure: XI ROBOTIC ASSISTED PARAESOPHAGEAL HERNIA REPAIR;  Surgeon: Jules Husbands, MD;  Location: [facility] ORS;  Service: General;  Laterality: N/A;    Home Medications:  Allergies as of 12/20/2020       Reactions   Nsaids Other (See Comments)   Patient has had part of her colon removed    Sulfa Antibiotics Rash        Medication List        Accurate as of December 20, 2020  9:06 AM. If you have any questions, ask your nurse or doctor.          acetaminophen 500 MG tablet Commonly known as: TYLENOL Take 1,000 mg by mouth every 6 (six) hours as needed for moderate pain or headache.   B-complex with vitamin C tablet Take 1 tablet by mouth daily.   buPROPion 150 MG 24 hr tablet Commonly known as: WELLBUTRIN XL Take 150 mg by mouth daily.   CALCIUM 600+D PO Take 1 tablet by mouth 2 (two) times daily.   colestipol 1 g tablet Commonly known as: COLESTID Take 3 tablets (3 g total) by mouth 2 (two) times daily.   DULoxetine 60 MG capsule Commonly known as: CYMBALTA Take 60 mg by mouth at bedtime.   fluorouracil 5 % cream Commonly known as: EFUDEX Apply topically 2 (two) times daily. X 7 days   furosemide 20 MG tablet Commonly known as: LASIX Take 1 tablet by mouth daily as needed.   mesalamine 0.375 g 24 hr capsule Commonly known as: APRISO Take 4 capsules (1.5 g total) by mouth daily.   pantoprazole 40 MG tablet Commonly known as: PROTONIX Take 40 mg by mouth daily.   traMADol 50 MG tablet Commonly known as: ULTRAM Take 100 mg by mouth 2 (two) times daily.   triamterene-hydrochlorothiazide 37.5-25 MG capsule Commonly known as: DYAZIDE Take 1 capsule by mouth every morning.   Vitamin D3 50 MCG (2000 UT) Tabs Take 2,000 Units by mouth daily.        Allergies:  Allergies  Allergen Reactions    Nsaids Other (See Comments)    Patient has had part of her colon removed    Sulfa Antibiotics Rash    Family History: Family History  Problem Relation Age of Onset   Breast cancer Sister 23   Breast cancer Paternal Aunt 93   Breast cancer Cousin        1st paternal cousin   Bladder Cancer Neg Hx    Prostate cancer Neg Hx    Kidney cancer Neg Hx     Social History:  reports that she has never smoked. She has never used smokeless tobacco. She reports that she does not drink alcohol and does not use drugs.  ROS: Pertinent ROS in HPI  Physical Exam: BP (!) 158/76   Pulse 89   Ht 5' 5"  (1.651 m)   Wt 165 lb (74.8 kg)   BMI 27.46 kg/m  Constitutional:  Well nourished. Alert and oriented, No acute distress. HEENT: [place] AT, mask in place.  Trachea midline Cardiovascular: No clubbing, cyanosis, or edema. Respiratory: Normal respiratory effort, no increased work of breathing. Neurologic: Grossly intact, no focal deficits, moving all 4 extremities. Psychiatric: Normal mood and affect.    Laboratory Data: Lab Results  Component Value Date   WBC 8.7 12/03/2020   HGB 15.5 12/03/2020   HCT 45.2 12/03/2020   MCV 93 12/03/2020   PLT 412 12/03/2020    Lab Results  Component Value Date   CREATININE 1.08 (H) 12/03/2020   Lab Results  Component Value Date   AST 22 12/03/2020   Lab Results  Component Value Date   ALT 23 12/03/2020    Urinalysis Component     Latest Ref Rng & Units 12/20/2020  Specific Gravity, UA     1.005 - 1.030 1.025  pH, UA     5.0 - 7.5 6.0  Color, UA     Yellow Orange  Appearance Ur     Clear Hazy (A)  Leukocytes,UA     Negative Negative  Protein,UA     Negative/Trace Negative  Glucose, UA     Negative Negative  Ketones, UA     Negative 1+ (A)  RBC, UA     Negative Negative  Bilirubin, UA     Negative Negative  Urobilinogen, Ur     0.2 - 1.0 mg/dL 0.2  Nitrite, UA     Negative Negative  Microscopic Examination      See below:    Component     Latest Ref Rng & Units 12/20/2020          WBC, UA     0 - 5 /hpf 0-5  RBC     0 - 2 /hpf 0-2  Epithelial Cells (non renal)     0 - 10 /hpf 0-10  Bacteria, UA     None seen/Few None seen  I have reviewed the labs.   Pertinent Imaging: N/A  Assessment & Plan:    1. Gross hematuria - I explained to the patient that there are a number of causes that can be associated with blood in the urine, such as stones, UTI's, damage to the urinary tract and/or cancer. -at this time, they are in a high risk stratification for hematuria per AUA guidelines due to their age (>60 years and gross heme  -recommended studies for high risk are CT urogram and cysto-cysto was completed recently  - I explained to the patient that a contrast material will be injected into a vein and that in rare instances, an allergic reaction can result and may even life threatening (1:100,000)  The patient denies any allergies to contrast, iodine and/or seafood and is not taking metformin - The patient had the opportunity to ask questions which were answered. Based upon this discussion, the patient is willing to proceed. Therefore, I've ordered: a CT Urogram - The patient will return following all of the above for discussion of the results.  - UA - Urine culture     Return for CT urogram for gross hematuria .  These notes generated with voice recognition software. I apologize for typographical errors.  Zara Council, PA-C  [facility] [address]  [place], [place] [address] [phone]

## 2020-12-20 ENCOUNTER — Ambulatory Visit: Payer: PPO | Admitting: Urology

## 2020-12-20 ENCOUNTER — Encounter: Payer: Self-pay | Admitting: Urology

## 2020-12-20 ENCOUNTER — Other Ambulatory Visit: Payer: Self-pay

## 2020-12-20 VITALS — BP 158/76 | HR 89 | Ht 65.0 in | Wt 165.0 lb

## 2020-12-20 DIAGNOSIS — R310 Gross hematuria: Secondary | ICD-10-CM

## 2020-12-20 LAB — URINALYSIS, COMPLETE
Bilirubin, UA: NEGATIVE
Glucose, UA: NEGATIVE
Leukocytes,UA: NEGATIVE
Nitrite, UA: NEGATIVE
Protein,UA: NEGATIVE
RBC, UA: NEGATIVE
Specific Gravity, UA: 1.025 (ref 1.005–1.030)
Urobilinogen, Ur: 0.2 mg/dL (ref 0.2–1.0)
pH, UA: 6 (ref 5.0–7.5)

## 2020-12-20 LAB — MICROSCOPIC EXAMINATION: Bacteria, UA: NONE SEEN

## 2020-12-23 DIAGNOSIS — I10 Essential (primary) hypertension: Secondary | ICD-10-CM | POA: Diagnosis not present

## 2020-12-23 DIAGNOSIS — E782 Mixed hyperlipidemia: Secondary | ICD-10-CM | POA: Diagnosis not present

## 2020-12-23 DIAGNOSIS — I700 Atherosclerosis of aorta: Secondary | ICD-10-CM | POA: Diagnosis not present

## 2020-12-25 DIAGNOSIS — M4184 Other forms of scoliosis, thoracic region: Secondary | ICD-10-CM | POA: Diagnosis not present

## 2020-12-25 DIAGNOSIS — M419 Scoliosis, unspecified: Secondary | ICD-10-CM | POA: Diagnosis not present

## 2020-12-25 LAB — CULTURE, URINE COMPREHENSIVE

## 2021-01-07 ENCOUNTER — Ambulatory Visit: Payer: PPO | Admitting: Urology

## 2021-01-10 ENCOUNTER — Other Ambulatory Visit: Payer: Self-pay

## 2021-01-10 ENCOUNTER — Ambulatory Visit
Admission: RE | Admit: 2021-01-10 | Discharge: 2021-01-10 | Disposition: A | Discharge status: Home / Self Care | Payer: PPO | Source: Ambulatory Visit | Attending: Urology | Admitting: Urology

## 2021-01-10 DIAGNOSIS — R310 Gross hematuria: Secondary | ICD-10-CM | POA: Diagnosis not present

## 2021-01-10 DIAGNOSIS — M4186 Other forms of scoliosis, lumbar region: Secondary | ICD-10-CM | POA: Diagnosis not present

## 2021-01-10 DIAGNOSIS — K449 Diaphragmatic hernia without obstruction or gangrene: Secondary | ICD-10-CM | POA: Diagnosis not present

## 2021-01-10 DIAGNOSIS — K5730 Diverticulosis of large intestine without perforation or abscess without bleeding: Secondary | ICD-10-CM | POA: Diagnosis not present

## 2021-01-10 MED ORDER — IOHEXOL 350 MG/ML SOLN
125.0000 mL | Freq: Once | INTRAVENOUS | Status: AC | PRN
  Administered 2021-01-10: 125 mL via INTRAVENOUS

## 2021-01-13 NOTE — Progress Notes (Signed)
01/14/2021 9:12 PM   April Larsen [birth year] [id number]  Referring provider: Rusty Aus, MD [facility] [place],  [place] [address]  Chief Complaint  Patient presents with   Follow-up    Urological history: 1. High risk hematuria -Non-smoker -contrast CT 03/2020 - NED -cysto 09/2020 - NED -reports of gross hematuria -UA negative for micro heme 11/2020  2. Urge incontinence -contributing factors of age, vaginal atrophy, depression, diuretics and dementia -PVR 0 mL 08/2020   HPI: April Larsen is a 75 y.o. female who presents today for CT urogram results.    CT urogram 12/2020- No CT findings of the abdomen or pelvis to explain hematuria. No evidence of urinary tract mass, calculus or hydronephrosis. No urinary tract filling defect on delayed phase imaging  Patient denies any modifying or aggravating factors.  Patient denies any gross hematuria, dysuria or suprapubic/flank pain.  Patient denies any fevers, chills, nausea or vomiting.    PMH: Past Medical History:  Diagnosis Date   Actinic keratosis    Anemia    LAST HGB 15.2 ON 04-22-16   Anxiety    Arthritis    Cancer ([place]) 2016   skin   Cataract (lens) fragments in eye following cataract surgery, bilateral 2013   one eye done then 4 weeks later the other eye done.   Chronic ulcerative colitis, without complications ([place]) 07/06/1854   Diverticulitis    Esophagitis, reflux 10/04/2014   Family history of adverse reaction to anesthesia    SISTER HARD TO WAKE UP and nausea and vomiting.   Female stress incontinence 08/09/2013   Gastroesophageal reflux disease without esophagitis 08/06/2016   GERD (gastroesophageal reflux disease)    Headache    MIGRAINE   History of hiatal hernia    Incomplete emptying of bladder 10/29/2013   Migraine 07/26/2013   PONV (postoperative nausea and vomiting)    nausea, does not remember vomiting.   Postherpetic neuralgia    RIGHT  7TH.CRANIAL NERVE from shingles followed by Bell's palsy   Postmenopausal 11/09/2016   PVC (premature ventricular contraction)    Scoliosis    Scoliosis (and kyphoscoliosis), idiopathic 07/26/2013   Scoliosis of lumbosacral spine 01/18/2015   Squamous cell carcinoma of skin 03/13/2013   L clavicle    Squamous cell carcinoma of skin 03/10/2018   L med knee   Squamous cell carcinoma of skin 02/21/2019   R cheek    Squamous cell carcinoma of skin 06/01/2019   L mid lat pretibial - ED&C   Trochanteric bursitis 11/29/2014   Ulcerative colitis ([place])    Urethral prolapse 08/09/2013   Urge incontinence 08/09/2013    Surgical History: Past Surgical History:  Procedure Laterality Date   ABDOMINAL HYSTERECTOMY     BACK SURGERY     L3-L4 AND L3-S1 DECOMPRESSION   CARPAL TUNNEL RELEASE     CHOLECYSTECTOMY N/A 08/21/2016   Procedure: LAPAROSCOPIC CHOLECYSTECTOMY WITH INTRAOPERATIVE CHOLANGIOGRAM;  Surgeon: Leonie Green, MD;  Location: [facility] ORS;  Service: General;  Laterality: N/A;   COLON SURGERY     COLONOSCOPY  11/06/2005   COLONOSCOPY WITH PROPOFOL N/A 07/29/2016   Procedure: COLONOSCOPY WITH PROPOFOL;  Surgeon: Manya Silvas, MD;  Location[facility] ENDOSCOPY;  Service: Endoscopy;  Laterality: N/A;   COLONOSCOPY WITH PROPOFOL N/A 08/22/2020   Procedure: COLONOSCOPY WITH PROPOFOL;  Surgeon: Virgel Manifold, MD;  Location: [facility] ENDOSCOPY;  Service: Endoscopy;  Laterality: N/A;   ESOPHAGOGASTRODUODENOSCOPY  11/06/2005   ESOPHAGOGASTRODUODENOSCOPY (  EGD) WITH PROPOFOL N/A 07/29/2016   Procedure: ESOPHAGOGASTRODUODENOSCOPY (EGD) WITH PROPOFOL;  Surgeon: Manya Silvas, MD;  Location: [facility] ENDOSCOPY;  Service: Endoscopy;  Laterality: N/A;   ESOPHAGOGASTRODUODENOSCOPY (EGD) WITH PROPOFOL N/A 02/07/2020   Procedure: ESOPHAGOGASTRODUODENOSCOPY (EGD) WITH PROPOFOL;  Surgeon: Virgel Manifold, MD;  Location: [facility] ENDOSCOPY;  Service: Endoscopy;  Laterality: N/A;   LAPAROSCOPIC LOW ANTERIOR  RESECTION  11/24/2016   Procedure: LAPAROSCOPIC LOW ANTERIOR RESECTION;  Surgeon: Jules Husbands, MD;  Location: [facility] ORS;  Service: General;;   XI ROBOTIC ASSISTED PARAESOPHAGEAL HERNIA REPAIR N/A 02/27/2020   Procedure: XI ROBOTIC ASSISTED PARAESOPHAGEAL HERNIA REPAIR;  Surgeon: Jules Husbands, MD;  Location: [facility] ORS;  Service: General;  Laterality: N/A;    Home Medications:  Allergies as of 01/14/2021       Reactions   Nsaids Other (See Comments)   Patient has had part of her colon removed    Sulfa Antibiotics Rash        Medication List        Accurate as of January 14, 2021 11:59 PM. If you have any questions, ask your nurse or doctor.          STOP taking these medications    fluorouracil 5 % cream Commonly known as: EFUDEX Stopped by: Zara Council, PA-C       TAKE these medications    acetaminophen 500 MG tablet Commonly known as: TYLENOL Take 1,000 mg by mouth every 6 (six) hours as needed for moderate pain or headache.   B-complex with vitamin C tablet Take 1 tablet by mouth daily.   buPROPion 150 MG 24 hr tablet Commonly known as: WELLBUTRIN XL Take 150 mg by mouth daily.   CALCIUM 600+D PO Take 1 tablet by mouth 2 (two) times daily.   colestipol 1 g tablet Commonly known as: COLESTID Take 3 tablets (3 g total) by mouth 2 (two) times daily.   DULoxetine 60 MG capsule Commonly known as: CYMBALTA Take 60 mg by mouth at bedtime.   furosemide 20 MG tablet Commonly known as: LASIX Take 1 tablet by mouth daily as needed.   mesalamine 0.375 g 24 hr capsule Commonly known as: APRISO Take 4 capsules (1.5 g total) by mouth daily.   pantoprazole 40 MG tablet Commonly known as: PROTONIX Take 40 mg by mouth daily.   traMADol 50 MG tablet Commonly known as: ULTRAM Take 100 mg by mouth 2 (two) times daily.   triamterene-hydrochlorothiazide 37.5-25 MG capsule Commonly known as: DYAZIDE Take 1 capsule by mouth every morning.   Vitamin D3 50  MCG (2000 UT) Tabs Take 2,000 Units by mouth daily.        Allergies:  Allergies  Allergen Reactions   Nsaids Other (See Comments)    Patient has had part of her colon removed    Sulfa Antibiotics Rash    Family History: Family History  Problem Relation Age of Onset   Breast cancer Sister 79   Breast cancer Paternal Aunt 75   Breast cancer Cousin        1st paternal cousin   Bladder Cancer Neg Hx    Prostate cancer Neg Hx    Kidney cancer Neg Hx     Social History:  reports that she has never smoked. She has never used smokeless tobacco. She reports that she does not drink alcohol and does not use drugs.  ROS: Pertinent ROS in HPI  Physical Exam: BP 124/79   Pulse (!) 102  Ht 5' 5"  (1.651 m)   Wt 165 lb (74.8 kg)   BMI 27.46 kg/m   Constitutional:  Well nourished. Alert and oriented, No acute distress. HEENT: [place] AT, mask in place.  Trachea midline Cardiovascular: No clubbing, cyanosis, or edema. Respiratory: Normal respiratory effort, no increased work of breathing. Neurologic: Grossly intact, no focal deficits, moving all 4 extremities. Psychiatric: Normal mood and affect.    Laboratory Data: N/A  Pertinent Imaging: CLINICAL DATA:  Gross hematuria   EXAM: CT ABDOMEN AND PELVIS WITHOUT AND WITH CONTRAST   TECHNIQUE: Multidetector CT imaging of the abdomen and pelvis was performed following the standard protocol before and following the bolus administration of intravenous contrast.   CONTRAST:  131m OMNIPAQUE IOHEXOL 350 MG/ML SOLN   COMPARISON:  04/25/2020   FINDINGS: Lower chest: No acute abnormality.  Small hiatal hernia.   Hepatobiliary: No focal liver abnormality is seen. Status post cholecystectomy. No biliary dilatation.   Pancreas: Unremarkable. No pancreatic ductal dilatation or surrounding inflammatory changes.   Spleen: Normal in size without significant abnormality.   Adrenals/Urinary Tract: Adrenal glands are unremarkable.  Kidneys are normal, without renal calculi, solid lesion, or hydronephrosis. Bladder is unremarkable.   Stomach/Bowel: Stomach is within normal limits. Appendix appears normal. No evidence of bowel wall thickening, distention, or inflammatory changes. Sigmoid colon resection and reanastomosis. Sigmoid diverticulosis.   Vascular/Lymphatic: Aortic atherosclerosis. No enlarged abdominal or pelvic lymph nodes.   Reproductive: Status post hysterectomy.   Other: No abdominal wall hernia or abnormality. No abdominopelvic ascites.   Musculoskeletal: No acute or significant osseous findings. Severe levoscoliosis of the thoracolumbar spine, apex L2.   IMPRESSION: 1. No CT findings of the abdomen or pelvis to explain hematuria. No evidence of urinary tract mass, calculus or hydronephrosis. No urinary tract filling defect on delayed phase imaging. 2. Status post sigmoid colon resection. 3. Sigmoid diverticulosis without evidence of acute diverticulitis. 4. Small hiatal hernia.   Aortic Atherosclerosis (ICD10-I70.0).     Electronically Signed   By: ADelanna AhmadiM.D.   On: 01/13/2021 09:21 I have independently reviewed the films.  See HPI.    Assessment & Plan:    1. Gross hematuria -no findings to explain gross hematuria on recent CT urogram or cystoscopy  -refer to nephrology for further work up of gross heme   Return in about 1 year (around 01/14/2022) for UA and symptom recheck .  These notes generated with voice recognition software. I apologize for typographical errors.  SZara Council PA-C  [address] [place] [phone]  I spent 15 minutes on the day of the encounter to include pre-visit record review, face-to-face time with the patient, and post-visit ordering of tests.

## 2021-01-14 ENCOUNTER — Encounter: Payer: Self-pay | Admitting: Urology

## 2021-01-14 ENCOUNTER — Ambulatory Visit: Payer: PPO | Admitting: Urology

## 2021-01-14 ENCOUNTER — Other Ambulatory Visit: Payer: Self-pay

## 2021-01-14 VITALS — BP 124/79 | HR 102 | Ht 65.0 in | Wt 165.0 lb

## 2021-01-14 DIAGNOSIS — R310 Gross hematuria: Secondary | ICD-10-CM | POA: Diagnosis not present

## 2021-01-14 DIAGNOSIS — M5116 Intervertebral disc disorders with radiculopathy, lumbar region: Secondary | ICD-10-CM | POA: Diagnosis not present

## 2021-01-14 DIAGNOSIS — M4147 Neuromuscular scoliosis, lumbosacral region: Secondary | ICD-10-CM | POA: Diagnosis not present

## 2021-01-16 DIAGNOSIS — M8588 Other specified disorders of bone density and structure, other site: Secondary | ICD-10-CM | POA: Diagnosis not present

## 2021-01-16 DIAGNOSIS — Z780 Asymptomatic menopausal state: Secondary | ICD-10-CM | POA: Diagnosis not present

## 2021-01-27 DIAGNOSIS — R310 Gross hematuria: Secondary | ICD-10-CM | POA: Diagnosis not present

## 2021-01-27 DIAGNOSIS — I10 Essential (primary) hypertension: Secondary | ICD-10-CM | POA: Diagnosis not present

## 2021-01-30 ENCOUNTER — Encounter: Payer: Self-pay | Admitting: Gastroenterology

## 2021-01-30 ENCOUNTER — Other Ambulatory Visit: Payer: Self-pay

## 2021-01-30 ENCOUNTER — Ambulatory Visit: Payer: PPO | Admitting: Gastroenterology

## 2021-01-30 VITALS — BP 145/87 | HR 82 | Wt 166.8 lb

## 2021-01-30 DIAGNOSIS — K5190 Ulcerative colitis, unspecified, without complications: Secondary | ICD-10-CM | POA: Diagnosis not present

## 2021-02-01 NOTE — Progress Notes (Signed)
April Antigua, MD [address]  [place]  [place], [place] [address]  Main: [phone]  Fax: [phone]   Primary Care Physician: April Aus, MD   Chief Complaint  Patient presents with   Follow-up    Pt reports she is still having intermittent loose stools    HPI: April Larsen is a 75 y.o. female here for follow up of UC. Pt reports she was overall doing well with only 1-2 Bms a day and no further diarrhea, but as of the last few weeks started eating more candy due to halloween and started having some loose stools due to this. During last visit she was advised to start taking colestipol 3 g BID, but has only been taking it once a day. Is taking apriso.   Previous history: Patient underwent colonoscopy on Aug 22, 2020   Impression:            - Erythematous mucosa in the ascending colon and in                        the cecum. Biopsied.                        - Diverticulosis in the sigmoid colon.                        - Patent surgical anastomosis, characterized by                        erythema.                        - The examination was otherwise normal.                        - The rectum, sigmoid colon, descending colon,                        transverse colon and terminal ileum are normal.                        Biopsied.                        - Non-bleeding internal hemorrhoids.   DIAGNOSIS:  A. TERMINAL ILEUM; COLD BIOPSY:  - ENTERIC MUCOSA WITH NORMAL VILLOUS ARCHITECTURE AND NO SIGNIFICANT  HISTOPATHOLOGIC CHANGE.  - SINGLE FRAGMENT OF COLONIC MUCOSA WITH NO SIGNIFICANT HISTOPATHOLOGIC  CHANGE.  - NEGATIVE FOR ACTIVE ILEITIS/COLITIS.  - NEGATIVE FOR GRANULOMA, DYSPLASIA, AND MALIGNANCY.   B. COLON, CECUM AND ASCENDING; COLD BIOPSY:  - CHRONIC COLITIS WITH MILD ACTIVITY (FOCAL CRYPTITIS AND SUPERFICIAL  ACTIVE MUCOSAL INFLAMMATION).  - NEGATIVE FOR GRANULOMA, DYSPLASIA, AND MALIGNANCY.   C. COLON, TRANSVERSE AND DESCENDING; COLD  BIOPSY:  - CHRONIC COLITIS WITHOUT ACTIVITY.  - NEGATIVE FOR GRANULOMA, DYSPLASIA, AND MALIGNANCY.   D. COLON, RECTOSIGMOID; COLD BIOPSY:  - CHRONIC PROCTITIS WITHOUT ACTIVITY.  - NEGATIVE FOR GRANULOMA, DYSPLASIA, AND MALIGNANCY.    Previous history: She was previously followed by [facility] GI, Dr. Vira Agar, and has a history of ulcerative colitis.  However, she has not been on any medications for years.  Over the years, patient has had some urinary  incontinence, and reports urgency with her bowel movements.  However, she  also reports constipation on some days, followed by days where she has a loose bowel movement.  No blood in stool.  No weight loss.  No abdominal pain.  No nausea or vomiting.   Previous history: Patient has a history of chronic ulcerative colitis documented in her chart.  On further review of her previous documentation, she was seen by [facility] for this but the history is somewhat unclear.  She has not been on any UC meds for years.I am able to see a pathology report from 2013 in her chart that shows mild chronic inactive colitis in all the biopsy specimens.  Procedure report not available.  She had a colonoscopy in 2018 that was normal with biopsies showing unremarkable colonic mucosa throughout.  She also had laparoscopic sigmoid resection for diverticulitis in 2018.   As per February 2018 note by Denice Paradise, Dr. Percell Boston PA, "Patient was hospitalized for C-diff colitis in 2010. Her colonoscopy 02/08/09 showing diverticulosis in the sigmoid colon, descending colon, transverse colon, internal hemorrhoids, and colon otherwise appeared normal. Biopsies of the ileocecal valve showed mild active colitis with architectural features of chronicity.The patient was having a lot of diarrhea at the time, quite symptomatic, with elevated ESR/CRP and Dr. Vira Agar felt the colonoscopy was not that impressive. She was given Entocort."  Assessment plan on that same note advises  to stop the Entocort.  ?So essentially patient was on Entocort without a clear diagnosis from 2010 to 2018  ROS: All ROS reviewed and negative except as per HPI   Past Medical History:  Diagnosis Date   Actinic keratosis    Anemia    LAST HGB 15.2 ON 04-22-16   Anxiety    Arthritis    Cancer ([place]) 2016   skin   Cataract (lens) fragments in eye following cataract surgery, bilateral 2013   one eye done then 4 weeks later the other eye done.   Chronic ulcerative colitis, without complications ([place]) 09/30/5327   Diverticulitis    Esophagitis, reflux 10/04/2014   Family history of adverse reaction to anesthesia    SISTER HARD TO WAKE UP and nausea and vomiting.   Female stress incontinence 08/09/2013   Gastroesophageal reflux disease without esophagitis 08/06/2016   GERD (gastroesophageal reflux disease)    Headache    MIGRAINE   History of hiatal hernia    Incomplete emptying of bladder 10/29/2013   Migraine 07/26/2013   PONV (postoperative nausea and vomiting)    nausea, does not remember vomiting.   Postherpetic neuralgia    RIGHT 7TH.CRANIAL NERVE from shingles followed by Bell's palsy   Postmenopausal 11/09/2016   PVC (premature ventricular contraction)    Scoliosis    Scoliosis (and kyphoscoliosis), idiopathic 07/26/2013   Scoliosis of lumbosacral spine 01/18/2015   Squamous cell carcinoma of skin 03/13/2013   L clavicle    Squamous cell carcinoma of skin 03/10/2018   L med knee   Squamous cell carcinoma of skin 02/21/2019   R cheek    Squamous cell carcinoma of skin 06/01/2019   L mid lat pretibial - ED&C   Trochanteric bursitis 11/29/2014   Ulcerative colitis ([place])    Urethral prolapse 08/09/2013   Urge incontinence 08/09/2013    Past Surgical History:  Procedure Laterality Date   ABDOMINAL HYSTERECTOMY     BACK SURGERY     L3-L4 AND L3-S1 DECOMPRESSION   CARPAL TUNNEL RELEASE     CHOLECYSTECTOMY N/A 08/21/2016   Procedure: LAPAROSCOPIC CHOLECYSTECTOMY WITH INTRAOPERATIVE  CHOLANGIOGRAM;  Surgeon:  Leonie Green, MD;  Location: [facility] ORS;  Service: General;  Laterality: N/A;   COLON SURGERY     COLONOSCOPY  11/06/2005   COLONOSCOPY WITH PROPOFOL N/A 07/29/2016   Procedure: COLONOSCOPY WITH PROPOFOL;  Surgeon: Manya Silvas, MD;  Location: [facility] ENDOSCOPY;  Service: Endoscopy;  Laterality: N/A;   COLONOSCOPY WITH PROPOFOL N/A 08/22/2020   Procedure: COLONOSCOPY WITH PROPOFOL;  Surgeon: Virgel Manifold, MD;  Location: [facility] ENDOSCOPY;  Service: Endoscopy;  Laterality: N/A;   ESOPHAGOGASTRODUODENOSCOPY  11/06/2005   ESOPHAGOGASTRODUODENOSCOPY (EGD) WITH PROPOFOL N/A 07/29/2016   Procedure: ESOPHAGOGASTRODUODENOSCOPY (EGD) WITH PROPOFOL;  Surgeon: Manya Silvas, MD;  Location: [facility] ENDOSCOPY;  Service: Endoscopy;  Laterality: N/A;   ESOPHAGOGASTRODUODENOSCOPY (EGD) WITH PROPOFOL N/A 02/07/2020   Procedure: ESOPHAGOGASTRODUODENOSCOPY (EGD) WITH PROPOFOL;  Surgeon: Virgel Manifold, MD;  Location: [facility] ENDOSCOPY;  Service: Endoscopy;  Laterality: N/A;   LAPAROSCOPIC LOW ANTERIOR RESECTION  11/24/2016   Procedure: LAPAROSCOPIC LOW ANTERIOR RESECTION;  Surgeon: Jules Husbands, MD;  Location: [facility] ORS;  Service: General;;   XI ROBOTIC ASSISTED PARAESOPHAGEAL HERNIA REPAIR N/A 02/27/2020   Procedure: XI ROBOTIC ASSISTED PARAESOPHAGEAL HERNIA REPAIR;  Surgeon: Jules Husbands, MD;  Location: [facility] ORS;  Service: General;  Laterality: N/A;    Prior to Admission medications   Medication Sig Start Date End Date Taking? Authorizing Provider  acetaminophen (TYLENOL) 500 MG tablet Take 1,000 mg by mouth every 6 (six) hours as needed for moderate pain or headache.    Yes [provider]  B Complex-C (B-COMPLEX WITH VITAMIN C) tablet Take 1 tablet by mouth daily.   Yes [provider]  Calcium Carbonate-Vitamin D (CALCIUM 600+D PO) Take 1 tablet by mouth 2 (two) times daily.   Yes [provider]  Cholecalciferol (VITAMIN D3) 50 MCG (2000 UT)  TABS Take 2,000 Units by mouth daily.   Yes [provider]  colestipol (COLESTID) 1 g tablet Take 3 tablets (3 g total) by mouth 2 (two) times daily. 12/18/20 03/18/21 Yes Virgel Manifold, MD  DULoxetine (CYMBALTA) 60 MG capsule Take 60 mg by mouth at bedtime.  06/17/15  Yes [provider]  furosemide (LASIX) 20 MG tablet Take 1 tablet by mouth daily as needed. 08/02/20 08/02/21 Yes [provider]  pantoprazole (PROTONIX) 40 MG tablet Take 40 mg by mouth daily.   Yes [provider]  traMADol (ULTRAM) 50 MG tablet Take 100 mg by mouth 2 (two) times daily. 12/18/20  Yes [provider]  triamterene-hydrochlorothiazide (DYAZIDE) 37.5-25 MG capsule Take 1 capsule by mouth every morning. 09/04/20  Yes [provider]  buPROPion (WELLBUTRIN XL) 150 MG 24 hr tablet Take 150 mg by mouth daily. 12/08/18 10/01/20  [provider]  mesalamine (APRISO) 0.375 g 24 hr capsule Take 4 capsules (1.5 g total) by mouth daily. 10/17/20 01/15/21  Virgel Manifold, MD    Family History  Problem Relation Age of Onset   Breast cancer Sister 63   Breast cancer Paternal Aunt 36   Breast cancer Cousin        1st paternal cousin   Bladder Cancer Neg Hx    Prostate cancer Neg Hx    Kidney cancer Neg Hx      Social History   Tobacco Use   Smoking status: Never   Smokeless tobacco: Never  Vaping Use   Vaping Use: Never used  Substance Use Topics   Alcohol use: No   Drug use: No    Allergies as  of 01/30/2021 - Review Complete 01/30/2021  Allergen Reaction Noted   Nsaids Other (See Comments) 01/05/2020   Sulfa antibiotics Rash 08/24/2015    Physical Examination:  Constitutional: General:   Alert,  Well-developed, well-nourished, pleasant and cooperative in NAD BP (!) 145/87   Pulse 82   Wt 166 lb 12.8 oz (75.7 kg)   BMI 27.76 kg/m   Respiratory: Normal respiratory effort  Gastrointestinal:  Soft, non-tender and non-distended without  masses, hepatosplenomegaly or hernias noted.  No guarding or rebound tenderness.     Cardiac: No clubbing or edema.  No cyanosis. Normal posterior tibial pedal pulses noted.  Psych:  Alert and cooperative. Normal mood and affect.  Musculoskeletal:  Normal gait. Head normocephalic, atraumatic. Symmetrical without gross deformities. 5/5 Lower extremity strength bilaterally.  Skin: Warm. Intact without significant lesions or rashes. No jaundice.  Neck: Supple, trachea midline  Lymph: No cervical lymphadenopathy  Psych:  Alert and oriented x3, Alert and cooperative. Normal mood and affect.  Labs: CMP     Component Value Date/Time   NA 135 12/03/2020 1150   NA 140 03/29/2012 1120   K 4.1 12/03/2020 1150   K 3.7 03/29/2012 1120   CL 97 12/03/2020 1150   CL 110 (H) 03/29/2012 1120   CO2 21 12/03/2020 1150   CO2 20 (L) 03/29/2012 1120   GLUCOSE 106 (H) 12/03/2020 1150   GLUCOSE 109 (H) 02/28/2020 0530   GLUCOSE 70 03/29/2012 1120   BUN 22 12/03/2020 1150   BUN 17 03/29/2012 1120   CREATININE 1.08 (H) 12/03/2020 1150   CREATININE 0.74 03/29/2012 1120   CALCIUM 10.7 (H) 12/03/2020 1150   CALCIUM 8.9 03/29/2012 1120   PROT 7.2 12/03/2020 1150   PROT 7.5 03/29/2012 1120   ALBUMIN 4.6 12/03/2020 1150   ALBUMIN 4.1 03/29/2012 1120   AST 22 12/03/2020 1150   AST 25 03/29/2012 1120   ALT 23 12/03/2020 1150   ALT 29 03/29/2012 1120   ALKPHOS 110 12/03/2020 1150   ALKPHOS 124 03/29/2012 1120   BILITOT 0.6 12/03/2020 1150   BILITOT 0.3 03/29/2012 1120   GFRNONAA >60 02/28/2020 0530   GFRNONAA >60 03/29/2012 1120   GFRAA >60 01/14/2019 0106   GFRAA >60 03/29/2012 1120   Lab Results  Component Value Date   WBC 8.7 12/03/2020   HGB 15.5 12/03/2020   HCT 45.2 12/03/2020   MCV 93 12/03/2020   PLT 412 12/03/2020    Imaging Studies:   Assessment and Plan:   April Larsen is a 75 y.o. y/o female with previous history of UC, previously followed with [facility] and  received entocort for years, then off medications, and reported to our clinic with loose stools, with most recent colonoscopy showing chronic colitis with mild activity  Patient was doing overall well on Apriso, but started eating candy as of the last 2 to 3 weeks due to Halloween and reports that loose stools have returned due to this.  She is aware, to avoid eating too much candy.  However, she was also advised to increase her colestipol dose to 3 g twice a day as of the last visit but did not do so and has been taking 3 g once a day.  Patient advised on correct doses of colestipol Patient also advised on correct doses of Apriso  Will check fecal calprotectin at this time as well  Was having 1-2 formed bowel movements a day until she started eating more candy than usual.  If bowel  movements do not return to the baseline 1-2 formed bowel movements a day after stopping excess candy, and taking appropriate Apriso and colestipol dose, patient advised to call us patient verbalized understanding  Continue follow-up in clinic  Dr April Larsen

## 2021-02-04 DIAGNOSIS — K5190 Ulcerative colitis, unspecified, without complications: Secondary | ICD-10-CM | POA: Diagnosis not present

## 2021-02-07 LAB — CALPROTECTIN, FECAL: Calprotectin, Fecal: 23 ug/g (ref 0–120)

## 2021-03-04 DIAGNOSIS — R310 Gross hematuria: Secondary | ICD-10-CM | POA: Diagnosis not present

## 2021-03-04 DIAGNOSIS — I10 Essential (primary) hypertension: Secondary | ICD-10-CM | POA: Diagnosis not present

## 2021-03-12 DIAGNOSIS — F419 Anxiety disorder, unspecified: Secondary | ICD-10-CM | POA: Diagnosis not present

## 2021-03-12 DIAGNOSIS — M4155 Other secondary scoliosis, thoracolumbar region: Secondary | ICD-10-CM | POA: Diagnosis not present

## 2021-03-12 DIAGNOSIS — M810 Age-related osteoporosis without current pathological fracture: Secondary | ICD-10-CM | POA: Diagnosis not present

## 2021-03-12 DIAGNOSIS — M4150 Other secondary scoliosis, site unspecified: Secondary | ICD-10-CM | POA: Diagnosis not present

## 2021-03-12 DIAGNOSIS — M419 Scoliosis, unspecified: Secondary | ICD-10-CM | POA: Diagnosis not present

## 2021-03-12 DIAGNOSIS — F32A Depression, unspecified: Secondary | ICD-10-CM | POA: Diagnosis not present

## 2021-03-12 DIAGNOSIS — R6889 Other general symptoms and signs: Secondary | ICD-10-CM | POA: Diagnosis not present

## 2021-03-12 DIAGNOSIS — K5900 Constipation, unspecified: Secondary | ICD-10-CM | POA: Diagnosis not present

## 2021-03-12 DIAGNOSIS — M4324 Fusion of spine, thoracic region: Secondary | ICD-10-CM | POA: Diagnosis not present

## 2021-03-12 DIAGNOSIS — M47812 Spondylosis without myelopathy or radiculopathy, cervical region: Secondary | ICD-10-CM | POA: Diagnosis not present

## 2021-03-12 DIAGNOSIS — R5381 Other malaise: Secondary | ICD-10-CM | POA: Diagnosis not present

## 2021-03-12 DIAGNOSIS — M4185 Other forms of scoliosis, thoracolumbar region: Secondary | ICD-10-CM | POA: Diagnosis not present

## 2021-03-12 DIAGNOSIS — Z9049 Acquired absence of other specified parts of digestive tract: Secondary | ICD-10-CM | POA: Diagnosis not present

## 2021-03-12 DIAGNOSIS — D649 Anemia, unspecified: Secondary | ICD-10-CM | POA: Diagnosis not present

## 2021-03-12 DIAGNOSIS — R2689 Other abnormalities of gait and mobility: Secondary | ICD-10-CM | POA: Diagnosis not present

## 2021-03-12 DIAGNOSIS — M4187 Other forms of scoliosis, lumbosacral region: Secondary | ICD-10-CM | POA: Diagnosis not present

## 2021-03-12 DIAGNOSIS — M438X5 Other specified deforming dorsopathies, thoracolumbar region: Secondary | ICD-10-CM | POA: Diagnosis not present

## 2021-03-12 DIAGNOSIS — Z981 Arthrodesis status: Secondary | ICD-10-CM | POA: Diagnosis not present

## 2021-03-12 DIAGNOSIS — M4157 Other secondary scoliosis, lumbosacral region: Secondary | ICD-10-CM | POA: Diagnosis not present

## 2021-03-12 DIAGNOSIS — Z01818 Encounter for other preprocedural examination: Secondary | ICD-10-CM | POA: Diagnosis not present

## 2021-03-12 DIAGNOSIS — K219 Gastro-esophageal reflux disease without esophagitis: Secondary | ICD-10-CM | POA: Diagnosis not present

## 2021-03-12 DIAGNOSIS — Z20822 Contact with and (suspected) exposure to covid-19: Secondary | ICD-10-CM | POA: Diagnosis not present

## 2021-03-12 DIAGNOSIS — Z4782 Encounter for orthopedic aftercare following scoliosis surgery: Secondary | ICD-10-CM | POA: Diagnosis not present

## 2021-03-12 DIAGNOSIS — Z7409 Other reduced mobility: Secondary | ICD-10-CM | POA: Diagnosis not present

## 2021-03-12 DIAGNOSIS — I10 Essential (primary) hypertension: Secondary | ICD-10-CM | POA: Diagnosis not present

## 2021-03-12 DIAGNOSIS — E669 Obesity, unspecified: Secondary | ICD-10-CM | POA: Diagnosis not present

## 2021-03-12 DIAGNOSIS — D72829 Elevated white blood cell count, unspecified: Secondary | ICD-10-CM | POA: Diagnosis not present

## 2021-03-12 DIAGNOSIS — G8918 Other acute postprocedural pain: Secondary | ICD-10-CM | POA: Diagnosis not present

## 2021-03-12 DIAGNOSIS — Z6827 Body mass index (BMI) 27.0-27.9, adult: Secondary | ICD-10-CM | POA: Diagnosis not present

## 2021-03-12 DIAGNOSIS — K5190 Ulcerative colitis, unspecified, without complications: Secondary | ICD-10-CM | POA: Diagnosis not present

## 2021-03-12 HISTORY — PX: BACK SURGERY: SHX140

## 2021-03-13 DIAGNOSIS — K219 Gastro-esophageal reflux disease without esophagitis: Secondary | ICD-10-CM | POA: Diagnosis not present

## 2021-03-13 DIAGNOSIS — I10 Essential (primary) hypertension: Secondary | ICD-10-CM | POA: Diagnosis not present

## 2021-03-13 DIAGNOSIS — F419 Anxiety disorder, unspecified: Secondary | ICD-10-CM | POA: Diagnosis not present

## 2021-03-13 DIAGNOSIS — M419 Scoliosis, unspecified: Secondary | ICD-10-CM | POA: Diagnosis not present

## 2021-03-13 DIAGNOSIS — M4150 Other secondary scoliosis, site unspecified: Secondary | ICD-10-CM | POA: Diagnosis not present

## 2021-03-13 DIAGNOSIS — K5190 Ulcerative colitis, unspecified, without complications: Secondary | ICD-10-CM | POA: Diagnosis not present

## 2021-03-13 DIAGNOSIS — R5381 Other malaise: Secondary | ICD-10-CM | POA: Diagnosis not present

## 2021-03-14 DIAGNOSIS — R5381 Other malaise: Secondary | ICD-10-CM | POA: Diagnosis not present

## 2021-03-14 DIAGNOSIS — M4150 Other secondary scoliosis, site unspecified: Secondary | ICD-10-CM | POA: Diagnosis not present

## 2021-03-14 DIAGNOSIS — K5190 Ulcerative colitis, unspecified, without complications: Secondary | ICD-10-CM | POA: Diagnosis not present

## 2021-03-14 DIAGNOSIS — F419 Anxiety disorder, unspecified: Secondary | ICD-10-CM | POA: Diagnosis not present

## 2021-03-14 DIAGNOSIS — I10 Essential (primary) hypertension: Secondary | ICD-10-CM | POA: Diagnosis not present

## 2021-03-14 DIAGNOSIS — M419 Scoliosis, unspecified: Secondary | ICD-10-CM | POA: Diagnosis not present

## 2021-03-14 DIAGNOSIS — K219 Gastro-esophageal reflux disease without esophagitis: Secondary | ICD-10-CM | POA: Diagnosis not present

## 2021-03-17 DIAGNOSIS — I10 Essential (primary) hypertension: Secondary | ICD-10-CM | POA: Diagnosis not present

## 2021-03-17 DIAGNOSIS — R5381 Other malaise: Secondary | ICD-10-CM | POA: Diagnosis not present

## 2021-03-17 DIAGNOSIS — K5190 Ulcerative colitis, unspecified, without complications: Secondary | ICD-10-CM | POA: Diagnosis not present

## 2021-03-17 DIAGNOSIS — K219 Gastro-esophageal reflux disease without esophagitis: Secondary | ICD-10-CM | POA: Diagnosis not present

## 2021-03-17 DIAGNOSIS — M4150 Other secondary scoliosis, site unspecified: Secondary | ICD-10-CM | POA: Diagnosis not present

## 2021-03-17 DIAGNOSIS — M419 Scoliosis, unspecified: Secondary | ICD-10-CM | POA: Diagnosis not present

## 2021-03-17 DIAGNOSIS — F419 Anxiety disorder, unspecified: Secondary | ICD-10-CM | POA: Diagnosis not present

## 2021-03-18 DIAGNOSIS — F419 Anxiety disorder, unspecified: Secondary | ICD-10-CM | POA: Diagnosis not present

## 2021-03-18 DIAGNOSIS — R5381 Other malaise: Secondary | ICD-10-CM | POA: Diagnosis not present

## 2021-03-18 DIAGNOSIS — I10 Essential (primary) hypertension: Secondary | ICD-10-CM | POA: Diagnosis not present

## 2021-03-18 DIAGNOSIS — M4150 Other secondary scoliosis, site unspecified: Secondary | ICD-10-CM | POA: Diagnosis not present

## 2021-03-18 DIAGNOSIS — K5190 Ulcerative colitis, unspecified, without complications: Secondary | ICD-10-CM | POA: Diagnosis not present

## 2021-03-18 DIAGNOSIS — K219 Gastro-esophageal reflux disease without esophagitis: Secondary | ICD-10-CM | POA: Diagnosis not present

## 2021-03-18 DIAGNOSIS — M419 Scoliosis, unspecified: Secondary | ICD-10-CM | POA: Diagnosis not present

## 2021-03-19 DIAGNOSIS — R5381 Other malaise: Secondary | ICD-10-CM | POA: Diagnosis not present

## 2021-03-19 DIAGNOSIS — K219 Gastro-esophageal reflux disease without esophagitis: Secondary | ICD-10-CM | POA: Diagnosis not present

## 2021-03-19 DIAGNOSIS — F419 Anxiety disorder, unspecified: Secondary | ICD-10-CM | POA: Diagnosis not present

## 2021-03-19 DIAGNOSIS — I10 Essential (primary) hypertension: Secondary | ICD-10-CM | POA: Diagnosis not present

## 2021-03-19 DIAGNOSIS — M419 Scoliosis, unspecified: Secondary | ICD-10-CM | POA: Diagnosis not present

## 2021-03-19 DIAGNOSIS — M4150 Other secondary scoliosis, site unspecified: Secondary | ICD-10-CM | POA: Diagnosis not present

## 2021-03-19 DIAGNOSIS — K5190 Ulcerative colitis, unspecified, without complications: Secondary | ICD-10-CM | POA: Diagnosis not present

## 2021-03-20 DIAGNOSIS — Z7409 Other reduced mobility: Secondary | ICD-10-CM | POA: Diagnosis not present

## 2021-03-20 DIAGNOSIS — G8928 Other chronic postprocedural pain: Secondary | ICD-10-CM | POA: Diagnosis not present

## 2021-03-20 DIAGNOSIS — Z4782 Encounter for orthopedic aftercare following scoliosis surgery: Secondary | ICD-10-CM | POA: Diagnosis not present

## 2021-03-20 DIAGNOSIS — D72829 Elevated white blood cell count, unspecified: Secondary | ICD-10-CM | POA: Diagnosis not present

## 2021-03-20 DIAGNOSIS — Z981 Arthrodesis status: Secondary | ICD-10-CM | POA: Diagnosis not present

## 2021-03-20 DIAGNOSIS — F32A Depression, unspecified: Secondary | ICD-10-CM | POA: Diagnosis not present

## 2021-03-20 DIAGNOSIS — I10 Essential (primary) hypertension: Secondary | ICD-10-CM | POA: Diagnosis not present

## 2021-03-20 DIAGNOSIS — D649 Anemia, unspecified: Secondary | ICD-10-CM | POA: Diagnosis not present

## 2021-03-20 DIAGNOSIS — Z79891 Long term (current) use of opiate analgesic: Secondary | ICD-10-CM | POA: Diagnosis not present

## 2021-03-20 DIAGNOSIS — Z01818 Encounter for other preprocedural examination: Secondary | ICD-10-CM | POA: Diagnosis not present

## 2021-03-20 DIAGNOSIS — M4185 Other forms of scoliosis, thoracolumbar region: Secondary | ICD-10-CM | POA: Diagnosis not present

## 2021-03-20 DIAGNOSIS — M419 Scoliosis, unspecified: Secondary | ICD-10-CM | POA: Diagnosis not present

## 2021-03-20 DIAGNOSIS — Z789 Other specified health status: Secondary | ICD-10-CM | POA: Diagnosis not present

## 2021-03-20 DIAGNOSIS — M47812 Spondylosis without myelopathy or radiculopathy, cervical region: Secondary | ICD-10-CM | POA: Diagnosis not present

## 2021-03-20 DIAGNOSIS — G8929 Other chronic pain: Secondary | ICD-10-CM | POA: Diagnosis not present

## 2021-03-20 DIAGNOSIS — R6889 Other general symptoms and signs: Secondary | ICD-10-CM | POA: Diagnosis not present

## 2021-03-20 DIAGNOSIS — K5190 Ulcerative colitis, unspecified, without complications: Secondary | ICD-10-CM | POA: Diagnosis not present

## 2021-03-20 DIAGNOSIS — K219 Gastro-esophageal reflux disease without esophagitis: Secondary | ICD-10-CM | POA: Diagnosis not present

## 2021-03-20 DIAGNOSIS — Z9289 Personal history of other medical treatment: Secondary | ICD-10-CM | POA: Diagnosis not present

## 2021-03-20 DIAGNOSIS — M4324 Fusion of spine, thoracic region: Secondary | ICD-10-CM | POA: Diagnosis not present

## 2021-03-20 DIAGNOSIS — K5900 Constipation, unspecified: Secondary | ICD-10-CM | POA: Diagnosis not present

## 2021-03-20 DIAGNOSIS — M810 Age-related osteoporosis without current pathological fracture: Secondary | ICD-10-CM | POA: Diagnosis not present

## 2021-03-20 DIAGNOSIS — G8918 Other acute postprocedural pain: Secondary | ICD-10-CM | POA: Diagnosis not present

## 2021-03-21 DIAGNOSIS — D72829 Elevated white blood cell count, unspecified: Secondary | ICD-10-CM | POA: Diagnosis not present

## 2021-03-21 DIAGNOSIS — G8918 Other acute postprocedural pain: Secondary | ICD-10-CM | POA: Diagnosis not present

## 2021-03-21 DIAGNOSIS — Z7409 Other reduced mobility: Secondary | ICD-10-CM | POA: Diagnosis not present

## 2021-03-21 DIAGNOSIS — Z79891 Long term (current) use of opiate analgesic: Secondary | ICD-10-CM | POA: Diagnosis not present

## 2021-03-21 DIAGNOSIS — D649 Anemia, unspecified: Secondary | ICD-10-CM | POA: Diagnosis not present

## 2021-03-21 DIAGNOSIS — G8928 Other chronic postprocedural pain: Secondary | ICD-10-CM | POA: Diagnosis not present

## 2021-03-21 DIAGNOSIS — K5190 Ulcerative colitis, unspecified, without complications: Secondary | ICD-10-CM | POA: Diagnosis not present

## 2021-03-21 DIAGNOSIS — I10 Essential (primary) hypertension: Secondary | ICD-10-CM | POA: Diagnosis not present

## 2021-03-21 DIAGNOSIS — K219 Gastro-esophageal reflux disease without esophagitis: Secondary | ICD-10-CM | POA: Diagnosis not present

## 2021-03-21 DIAGNOSIS — M4185 Other forms of scoliosis, thoracolumbar region: Secondary | ICD-10-CM | POA: Diagnosis not present

## 2021-03-22 DIAGNOSIS — M419 Scoliosis, unspecified: Secondary | ICD-10-CM | POA: Diagnosis not present

## 2021-03-22 DIAGNOSIS — D649 Anemia, unspecified: Secondary | ICD-10-CM | POA: Diagnosis not present

## 2021-03-22 DIAGNOSIS — I10 Essential (primary) hypertension: Secondary | ICD-10-CM | POA: Diagnosis not present

## 2021-03-22 DIAGNOSIS — Z7409 Other reduced mobility: Secondary | ICD-10-CM | POA: Diagnosis not present

## 2021-03-22 DIAGNOSIS — G8929 Other chronic pain: Secondary | ICD-10-CM | POA: Diagnosis not present

## 2021-03-22 DIAGNOSIS — Z9289 Personal history of other medical treatment: Secondary | ICD-10-CM | POA: Diagnosis not present

## 2021-03-22 DIAGNOSIS — K219 Gastro-esophageal reflux disease without esophagitis: Secondary | ICD-10-CM | POA: Diagnosis not present

## 2021-03-22 DIAGNOSIS — K5190 Ulcerative colitis, unspecified, without complications: Secondary | ICD-10-CM | POA: Diagnosis not present

## 2021-03-22 DIAGNOSIS — D72829 Elevated white blood cell count, unspecified: Secondary | ICD-10-CM | POA: Diagnosis not present

## 2021-03-22 DIAGNOSIS — Z4782 Encounter for orthopedic aftercare following scoliosis surgery: Secondary | ICD-10-CM | POA: Diagnosis not present

## 2021-03-22 DIAGNOSIS — Z789 Other specified health status: Secondary | ICD-10-CM | POA: Diagnosis not present

## 2021-03-22 DIAGNOSIS — G8918 Other acute postprocedural pain: Secondary | ICD-10-CM | POA: Diagnosis not present

## 2021-03-23 DIAGNOSIS — D72829 Elevated white blood cell count, unspecified: Secondary | ICD-10-CM | POA: Diagnosis not present

## 2021-03-23 DIAGNOSIS — D649 Anemia, unspecified: Secondary | ICD-10-CM | POA: Diagnosis not present

## 2021-03-23 DIAGNOSIS — K219 Gastro-esophageal reflux disease without esophagitis: Secondary | ICD-10-CM | POA: Diagnosis not present

## 2021-03-23 DIAGNOSIS — G8929 Other chronic pain: Secondary | ICD-10-CM | POA: Diagnosis not present

## 2021-03-23 DIAGNOSIS — M419 Scoliosis, unspecified: Secondary | ICD-10-CM | POA: Diagnosis not present

## 2021-03-23 DIAGNOSIS — K5190 Ulcerative colitis, unspecified, without complications: Secondary | ICD-10-CM | POA: Diagnosis not present

## 2021-03-23 DIAGNOSIS — Z9289 Personal history of other medical treatment: Secondary | ICD-10-CM | POA: Diagnosis not present

## 2021-03-23 DIAGNOSIS — Z7409 Other reduced mobility: Secondary | ICD-10-CM | POA: Diagnosis not present

## 2021-03-23 DIAGNOSIS — G8918 Other acute postprocedural pain: Secondary | ICD-10-CM | POA: Diagnosis not present

## 2021-03-23 DIAGNOSIS — Z789 Other specified health status: Secondary | ICD-10-CM | POA: Diagnosis not present

## 2021-03-23 DIAGNOSIS — I10 Essential (primary) hypertension: Secondary | ICD-10-CM | POA: Diagnosis not present

## 2021-03-23 DIAGNOSIS — Z4782 Encounter for orthopedic aftercare following scoliosis surgery: Secondary | ICD-10-CM | POA: Diagnosis not present

## 2021-03-25 DIAGNOSIS — I10 Essential (primary) hypertension: Secondary | ICD-10-CM | POA: Diagnosis not present

## 2021-03-25 DIAGNOSIS — G8928 Other chronic postprocedural pain: Secondary | ICD-10-CM | POA: Diagnosis not present

## 2021-03-25 DIAGNOSIS — D649 Anemia, unspecified: Secondary | ICD-10-CM | POA: Diagnosis not present

## 2021-03-25 DIAGNOSIS — K219 Gastro-esophageal reflux disease without esophagitis: Secondary | ICD-10-CM | POA: Diagnosis not present

## 2021-03-25 DIAGNOSIS — Z7409 Other reduced mobility: Secondary | ICD-10-CM | POA: Diagnosis not present

## 2021-03-25 DIAGNOSIS — K5190 Ulcerative colitis, unspecified, without complications: Secondary | ICD-10-CM | POA: Diagnosis not present

## 2021-03-25 DIAGNOSIS — G8918 Other acute postprocedural pain: Secondary | ICD-10-CM | POA: Diagnosis not present

## 2021-03-25 DIAGNOSIS — M4185 Other forms of scoliosis, thoracolumbar region: Secondary | ICD-10-CM | POA: Diagnosis not present

## 2021-03-25 DIAGNOSIS — D72829 Elevated white blood cell count, unspecified: Secondary | ICD-10-CM | POA: Diagnosis not present

## 2021-03-26 DIAGNOSIS — D649 Anemia, unspecified: Secondary | ICD-10-CM | POA: Diagnosis not present

## 2021-03-26 DIAGNOSIS — G8929 Other chronic pain: Secondary | ICD-10-CM | POA: Diagnosis not present

## 2021-03-26 DIAGNOSIS — K219 Gastro-esophageal reflux disease without esophagitis: Secondary | ICD-10-CM | POA: Diagnosis not present

## 2021-03-26 DIAGNOSIS — D72829 Elevated white blood cell count, unspecified: Secondary | ICD-10-CM | POA: Diagnosis not present

## 2021-03-26 DIAGNOSIS — Z79891 Long term (current) use of opiate analgesic: Secondary | ICD-10-CM | POA: Diagnosis not present

## 2021-03-26 DIAGNOSIS — M4185 Other forms of scoliosis, thoracolumbar region: Secondary | ICD-10-CM | POA: Diagnosis not present

## 2021-03-26 DIAGNOSIS — Z7409 Other reduced mobility: Secondary | ICD-10-CM | POA: Diagnosis not present

## 2021-03-26 DIAGNOSIS — G8918 Other acute postprocedural pain: Secondary | ICD-10-CM | POA: Diagnosis not present

## 2021-03-26 DIAGNOSIS — K5190 Ulcerative colitis, unspecified, without complications: Secondary | ICD-10-CM | POA: Diagnosis not present

## 2021-03-26 DIAGNOSIS — I10 Essential (primary) hypertension: Secondary | ICD-10-CM | POA: Diagnosis not present

## 2021-03-27 DIAGNOSIS — Z79891 Long term (current) use of opiate analgesic: Secondary | ICD-10-CM | POA: Diagnosis not present

## 2021-03-27 DIAGNOSIS — D649 Anemia, unspecified: Secondary | ICD-10-CM | POA: Diagnosis not present

## 2021-03-27 DIAGNOSIS — M4185 Other forms of scoliosis, thoracolumbar region: Secondary | ICD-10-CM | POA: Diagnosis not present

## 2021-03-27 DIAGNOSIS — I10 Essential (primary) hypertension: Secondary | ICD-10-CM | POA: Diagnosis not present

## 2021-03-27 DIAGNOSIS — K219 Gastro-esophageal reflux disease without esophagitis: Secondary | ICD-10-CM | POA: Diagnosis not present

## 2021-03-27 DIAGNOSIS — G8918 Other acute postprocedural pain: Secondary | ICD-10-CM | POA: Diagnosis not present

## 2021-03-27 DIAGNOSIS — D72829 Elevated white blood cell count, unspecified: Secondary | ICD-10-CM | POA: Diagnosis not present

## 2021-03-27 DIAGNOSIS — Z7409 Other reduced mobility: Secondary | ICD-10-CM | POA: Diagnosis not present

## 2021-03-27 DIAGNOSIS — K5190 Ulcerative colitis, unspecified, without complications: Secondary | ICD-10-CM | POA: Diagnosis not present

## 2021-03-27 DIAGNOSIS — G8929 Other chronic pain: Secondary | ICD-10-CM | POA: Diagnosis not present

## 2021-03-28 DIAGNOSIS — M4185 Other forms of scoliosis, thoracolumbar region: Secondary | ICD-10-CM | POA: Diagnosis not present

## 2021-03-28 DIAGNOSIS — G8929 Other chronic pain: Secondary | ICD-10-CM | POA: Diagnosis not present

## 2021-03-28 DIAGNOSIS — D649 Anemia, unspecified: Secondary | ICD-10-CM | POA: Diagnosis not present

## 2021-03-28 DIAGNOSIS — K219 Gastro-esophageal reflux disease without esophagitis: Secondary | ICD-10-CM | POA: Diagnosis not present

## 2021-03-28 DIAGNOSIS — D72829 Elevated white blood cell count, unspecified: Secondary | ICD-10-CM | POA: Diagnosis not present

## 2021-03-28 DIAGNOSIS — I10 Essential (primary) hypertension: Secondary | ICD-10-CM | POA: Diagnosis not present

## 2021-03-28 DIAGNOSIS — Z7409 Other reduced mobility: Secondary | ICD-10-CM | POA: Diagnosis not present

## 2021-03-28 DIAGNOSIS — K5190 Ulcerative colitis, unspecified, without complications: Secondary | ICD-10-CM | POA: Diagnosis not present

## 2021-03-28 DIAGNOSIS — Z79891 Long term (current) use of opiate analgesic: Secondary | ICD-10-CM | POA: Diagnosis not present

## 2021-03-28 DIAGNOSIS — G8918 Other acute postprocedural pain: Secondary | ICD-10-CM | POA: Diagnosis not present

## 2021-04-01 DIAGNOSIS — M4185 Other forms of scoliosis, thoracolumbar region: Secondary | ICD-10-CM | POA: Diagnosis not present

## 2021-04-01 DIAGNOSIS — Z7409 Other reduced mobility: Secondary | ICD-10-CM | POA: Diagnosis not present

## 2021-04-01 DIAGNOSIS — G8929 Other chronic pain: Secondary | ICD-10-CM | POA: Diagnosis not present

## 2021-04-01 DIAGNOSIS — D72829 Elevated white blood cell count, unspecified: Secondary | ICD-10-CM | POA: Diagnosis not present

## 2021-04-01 DIAGNOSIS — D649 Anemia, unspecified: Secondary | ICD-10-CM | POA: Diagnosis not present

## 2021-04-01 DIAGNOSIS — G8918 Other acute postprocedural pain: Secondary | ICD-10-CM | POA: Diagnosis not present

## 2021-04-01 DIAGNOSIS — Z79891 Long term (current) use of opiate analgesic: Secondary | ICD-10-CM | POA: Diagnosis not present

## 2021-04-01 DIAGNOSIS — I10 Essential (primary) hypertension: Secondary | ICD-10-CM | POA: Diagnosis not present

## 2021-04-01 DIAGNOSIS — K219 Gastro-esophageal reflux disease without esophagitis: Secondary | ICD-10-CM | POA: Diagnosis not present

## 2021-04-01 DIAGNOSIS — K5190 Ulcerative colitis, unspecified, without complications: Secondary | ICD-10-CM | POA: Diagnosis not present

## 2021-04-04 DIAGNOSIS — D500 Iron deficiency anemia secondary to blood loss (chronic): Secondary | ICD-10-CM | POA: Diagnosis not present

## 2021-04-04 DIAGNOSIS — M4147 Neuromuscular scoliosis, lumbosacral region: Secondary | ICD-10-CM | POA: Diagnosis not present

## 2021-04-04 DIAGNOSIS — M5116 Intervertebral disc disorders with radiculopathy, lumbar region: Secondary | ICD-10-CM | POA: Diagnosis not present

## 2021-04-07 DIAGNOSIS — K5190 Ulcerative colitis, unspecified, without complications: Secondary | ICD-10-CM | POA: Diagnosis not present

## 2021-04-07 DIAGNOSIS — F32A Depression, unspecified: Secondary | ICD-10-CM | POA: Diagnosis not present

## 2021-04-07 DIAGNOSIS — Z4782 Encounter for orthopedic aftercare following scoliosis surgery: Secondary | ICD-10-CM | POA: Diagnosis not present

## 2021-04-07 DIAGNOSIS — M419 Scoliosis, unspecified: Secondary | ICD-10-CM | POA: Diagnosis not present

## 2021-04-07 DIAGNOSIS — G8929 Other chronic pain: Secondary | ICD-10-CM | POA: Diagnosis not present

## 2021-04-07 DIAGNOSIS — R32 Unspecified urinary incontinence: Secondary | ICD-10-CM | POA: Diagnosis not present

## 2021-04-07 DIAGNOSIS — D649 Anemia, unspecified: Secondary | ICD-10-CM | POA: Diagnosis not present

## 2021-04-07 DIAGNOSIS — Z79891 Long term (current) use of opiate analgesic: Secondary | ICD-10-CM | POA: Diagnosis not present

## 2021-04-07 DIAGNOSIS — Z981 Arthrodesis status: Secondary | ICD-10-CM | POA: Diagnosis not present

## 2021-04-07 DIAGNOSIS — Z9181 History of falling: Secondary | ICD-10-CM | POA: Diagnosis not present

## 2021-04-07 DIAGNOSIS — K219 Gastro-esophageal reflux disease without esophagitis: Secondary | ICD-10-CM | POA: Diagnosis not present

## 2021-04-07 DIAGNOSIS — K5900 Constipation, unspecified: Secondary | ICD-10-CM | POA: Diagnosis not present

## 2021-04-07 DIAGNOSIS — M810 Age-related osteoporosis without current pathological fracture: Secondary | ICD-10-CM | POA: Diagnosis not present

## 2021-04-07 DIAGNOSIS — I10 Essential (primary) hypertension: Secondary | ICD-10-CM | POA: Diagnosis not present

## 2021-04-10 DIAGNOSIS — D649 Anemia, unspecified: Secondary | ICD-10-CM | POA: Diagnosis not present

## 2021-04-10 DIAGNOSIS — G8929 Other chronic pain: Secondary | ICD-10-CM | POA: Diagnosis not present

## 2021-04-10 DIAGNOSIS — Z79891 Long term (current) use of opiate analgesic: Secondary | ICD-10-CM | POA: Diagnosis not present

## 2021-04-10 DIAGNOSIS — K219 Gastro-esophageal reflux disease without esophagitis: Secondary | ICD-10-CM | POA: Diagnosis not present

## 2021-04-10 DIAGNOSIS — M419 Scoliosis, unspecified: Secondary | ICD-10-CM | POA: Diagnosis not present

## 2021-04-10 DIAGNOSIS — K5900 Constipation, unspecified: Secondary | ICD-10-CM | POA: Diagnosis not present

## 2021-04-10 DIAGNOSIS — M810 Age-related osteoporosis without current pathological fracture: Secondary | ICD-10-CM | POA: Diagnosis not present

## 2021-04-10 DIAGNOSIS — R32 Unspecified urinary incontinence: Secondary | ICD-10-CM | POA: Diagnosis not present

## 2021-04-10 DIAGNOSIS — K5190 Ulcerative colitis, unspecified, without complications: Secondary | ICD-10-CM | POA: Diagnosis not present

## 2021-04-10 DIAGNOSIS — I10 Essential (primary) hypertension: Secondary | ICD-10-CM | POA: Diagnosis not present

## 2021-04-10 DIAGNOSIS — Z4782 Encounter for orthopedic aftercare following scoliosis surgery: Secondary | ICD-10-CM | POA: Diagnosis not present

## 2021-04-10 DIAGNOSIS — F32A Depression, unspecified: Secondary | ICD-10-CM | POA: Diagnosis not present

## 2021-04-10 DIAGNOSIS — Z9181 History of falling: Secondary | ICD-10-CM | POA: Diagnosis not present

## 2021-04-10 DIAGNOSIS — Z981 Arthrodesis status: Secondary | ICD-10-CM | POA: Diagnosis not present

## 2021-04-15 DIAGNOSIS — I10 Essential (primary) hypertension: Secondary | ICD-10-CM | POA: Diagnosis not present

## 2021-04-15 DIAGNOSIS — G8929 Other chronic pain: Secondary | ICD-10-CM | POA: Diagnosis not present

## 2021-04-15 DIAGNOSIS — M810 Age-related osteoporosis without current pathological fracture: Secondary | ICD-10-CM | POA: Diagnosis not present

## 2021-04-15 DIAGNOSIS — Z4782 Encounter for orthopedic aftercare following scoliosis surgery: Secondary | ICD-10-CM | POA: Diagnosis not present

## 2021-04-15 DIAGNOSIS — M419 Scoliosis, unspecified: Secondary | ICD-10-CM | POA: Diagnosis not present

## 2021-04-15 DIAGNOSIS — K5190 Ulcerative colitis, unspecified, without complications: Secondary | ICD-10-CM | POA: Diagnosis not present

## 2021-04-15 DIAGNOSIS — K5900 Constipation, unspecified: Secondary | ICD-10-CM | POA: Diagnosis not present

## 2021-04-16 DIAGNOSIS — M5442 Lumbago with sciatica, left side: Secondary | ICD-10-CM | POA: Diagnosis not present

## 2021-04-18 DIAGNOSIS — F32A Depression, unspecified: Secondary | ICD-10-CM | POA: Diagnosis not present

## 2021-04-18 DIAGNOSIS — G8929 Other chronic pain: Secondary | ICD-10-CM | POA: Diagnosis not present

## 2021-04-18 DIAGNOSIS — R32 Unspecified urinary incontinence: Secondary | ICD-10-CM | POA: Diagnosis not present

## 2021-04-18 DIAGNOSIS — Z9181 History of falling: Secondary | ICD-10-CM | POA: Diagnosis not present

## 2021-04-18 DIAGNOSIS — Z4782 Encounter for orthopedic aftercare following scoliosis surgery: Secondary | ICD-10-CM | POA: Diagnosis not present

## 2021-04-18 DIAGNOSIS — M419 Scoliosis, unspecified: Secondary | ICD-10-CM | POA: Diagnosis not present

## 2021-04-18 DIAGNOSIS — D649 Anemia, unspecified: Secondary | ICD-10-CM | POA: Diagnosis not present

## 2021-04-18 DIAGNOSIS — K5900 Constipation, unspecified: Secondary | ICD-10-CM | POA: Diagnosis not present

## 2021-04-18 DIAGNOSIS — M810 Age-related osteoporosis without current pathological fracture: Secondary | ICD-10-CM | POA: Diagnosis not present

## 2021-04-18 DIAGNOSIS — K5190 Ulcerative colitis, unspecified, without complications: Secondary | ICD-10-CM | POA: Diagnosis not present

## 2021-04-18 DIAGNOSIS — I10 Essential (primary) hypertension: Secondary | ICD-10-CM | POA: Diagnosis not present

## 2021-04-18 DIAGNOSIS — Z79891 Long term (current) use of opiate analgesic: Secondary | ICD-10-CM | POA: Diagnosis not present

## 2021-04-18 DIAGNOSIS — Z981 Arthrodesis status: Secondary | ICD-10-CM | POA: Diagnosis not present

## 2021-04-18 DIAGNOSIS — K219 Gastro-esophageal reflux disease without esophagitis: Secondary | ICD-10-CM | POA: Diagnosis not present

## 2021-05-01 DIAGNOSIS — M5441 Lumbago with sciatica, right side: Secondary | ICD-10-CM | POA: Diagnosis not present

## 2021-05-01 DIAGNOSIS — M5442 Lumbago with sciatica, left side: Secondary | ICD-10-CM | POA: Diagnosis not present

## 2021-05-01 DIAGNOSIS — G8929 Other chronic pain: Secondary | ICD-10-CM | POA: Diagnosis not present

## 2021-05-01 DIAGNOSIS — R531 Weakness: Secondary | ICD-10-CM | POA: Diagnosis not present

## 2021-05-01 DIAGNOSIS — Z981 Arthrodesis status: Secondary | ICD-10-CM | POA: Diagnosis not present

## 2021-05-06 DIAGNOSIS — M5442 Lumbago with sciatica, left side: Secondary | ICD-10-CM | POA: Diagnosis not present

## 2021-05-06 DIAGNOSIS — Z981 Arthrodesis status: Secondary | ICD-10-CM | POA: Diagnosis not present

## 2021-05-06 DIAGNOSIS — M5441 Lumbago with sciatica, right side: Secondary | ICD-10-CM | POA: Diagnosis not present

## 2021-05-09 DIAGNOSIS — Z981 Arthrodesis status: Secondary | ICD-10-CM | POA: Diagnosis not present

## 2021-05-09 DIAGNOSIS — M5442 Lumbago with sciatica, left side: Secondary | ICD-10-CM | POA: Diagnosis not present

## 2021-05-09 DIAGNOSIS — M5441 Lumbago with sciatica, right side: Secondary | ICD-10-CM | POA: Diagnosis not present

## 2021-05-12 DIAGNOSIS — Z981 Arthrodesis status: Secondary | ICD-10-CM | POA: Diagnosis not present

## 2021-05-12 DIAGNOSIS — M5442 Lumbago with sciatica, left side: Secondary | ICD-10-CM | POA: Diagnosis not present

## 2021-05-12 DIAGNOSIS — M5441 Lumbago with sciatica, right side: Secondary | ICD-10-CM | POA: Diagnosis not present

## 2021-05-14 ENCOUNTER — Ambulatory Visit: Payer: PPO | Admitting: Gastroenterology

## 2021-05-15 DIAGNOSIS — M5441 Lumbago with sciatica, right side: Secondary | ICD-10-CM | POA: Diagnosis not present

## 2021-05-15 DIAGNOSIS — Z981 Arthrodesis status: Secondary | ICD-10-CM | POA: Diagnosis not present

## 2021-05-15 DIAGNOSIS — M5442 Lumbago with sciatica, left side: Secondary | ICD-10-CM | POA: Diagnosis not present

## 2021-05-19 DIAGNOSIS — M5442 Lumbago with sciatica, left side: Secondary | ICD-10-CM | POA: Diagnosis not present

## 2021-05-19 DIAGNOSIS — M5441 Lumbago with sciatica, right side: Secondary | ICD-10-CM | POA: Diagnosis not present

## 2021-05-19 DIAGNOSIS — Z981 Arthrodesis status: Secondary | ICD-10-CM | POA: Diagnosis not present

## 2021-05-22 DIAGNOSIS — R300 Dysuria: Secondary | ICD-10-CM | POA: Diagnosis not present

## 2021-05-22 DIAGNOSIS — M5441 Lumbago with sciatica, right side: Secondary | ICD-10-CM | POA: Diagnosis not present

## 2021-05-22 DIAGNOSIS — M5442 Lumbago with sciatica, left side: Secondary | ICD-10-CM | POA: Diagnosis not present

## 2021-05-22 DIAGNOSIS — Z981 Arthrodesis status: Secondary | ICD-10-CM | POA: Diagnosis not present

## 2021-05-26 DIAGNOSIS — M5442 Lumbago with sciatica, left side: Secondary | ICD-10-CM | POA: Diagnosis not present

## 2021-05-26 DIAGNOSIS — M5441 Lumbago with sciatica, right side: Secondary | ICD-10-CM | POA: Diagnosis not present

## 2021-05-26 DIAGNOSIS — Z981 Arthrodesis status: Secondary | ICD-10-CM | POA: Diagnosis not present

## 2021-05-30 DIAGNOSIS — Z981 Arthrodesis status: Secondary | ICD-10-CM | POA: Diagnosis not present

## 2021-06-03 DIAGNOSIS — M5441 Lumbago with sciatica, right side: Secondary | ICD-10-CM | POA: Diagnosis not present

## 2021-06-03 DIAGNOSIS — M5442 Lumbago with sciatica, left side: Secondary | ICD-10-CM | POA: Diagnosis not present

## 2021-06-03 DIAGNOSIS — Z981 Arthrodesis status: Secondary | ICD-10-CM | POA: Diagnosis not present

## 2021-06-04 DIAGNOSIS — D0471 Carcinoma in situ of skin of right lower limb, including hip: Secondary | ICD-10-CM | POA: Diagnosis not present

## 2021-06-04 DIAGNOSIS — Z85828 Personal history of other malignant neoplasm of skin: Secondary | ICD-10-CM | POA: Diagnosis not present

## 2021-06-04 DIAGNOSIS — D0472 Carcinoma in situ of skin of left lower limb, including hip: Secondary | ICD-10-CM | POA: Diagnosis not present

## 2021-06-04 DIAGNOSIS — D2262 Melanocytic nevi of left upper limb, including shoulder: Secondary | ICD-10-CM | POA: Diagnosis not present

## 2021-06-04 DIAGNOSIS — C44622 Squamous cell carcinoma of skin of right upper limb, including shoulder: Secondary | ICD-10-CM | POA: Diagnosis not present

## 2021-06-04 DIAGNOSIS — D2271 Melanocytic nevi of right lower limb, including hip: Secondary | ICD-10-CM | POA: Diagnosis not present

## 2021-06-04 DIAGNOSIS — L821 Other seborrheic keratosis: Secondary | ICD-10-CM | POA: Diagnosis not present

## 2021-06-04 DIAGNOSIS — D225 Melanocytic nevi of trunk: Secondary | ICD-10-CM | POA: Diagnosis not present

## 2021-06-04 DIAGNOSIS — D2272 Melanocytic nevi of left lower limb, including hip: Secondary | ICD-10-CM | POA: Diagnosis not present

## 2021-06-04 DIAGNOSIS — D485 Neoplasm of uncertain behavior of skin: Secondary | ICD-10-CM | POA: Diagnosis not present

## 2021-06-05 DIAGNOSIS — M5442 Lumbago with sciatica, left side: Secondary | ICD-10-CM | POA: Diagnosis not present

## 2021-06-05 DIAGNOSIS — M5441 Lumbago with sciatica, right side: Secondary | ICD-10-CM | POA: Diagnosis not present

## 2021-06-05 DIAGNOSIS — Z981 Arthrodesis status: Secondary | ICD-10-CM | POA: Diagnosis not present

## 2021-06-09 DIAGNOSIS — M5441 Lumbago with sciatica, right side: Secondary | ICD-10-CM | POA: Diagnosis not present

## 2021-06-09 DIAGNOSIS — M5442 Lumbago with sciatica, left side: Secondary | ICD-10-CM | POA: Diagnosis not present

## 2021-06-09 DIAGNOSIS — Z981 Arthrodesis status: Secondary | ICD-10-CM | POA: Diagnosis not present

## 2021-06-10 DIAGNOSIS — D500 Iron deficiency anemia secondary to blood loss (chronic): Secondary | ICD-10-CM | POA: Diagnosis not present

## 2021-06-10 DIAGNOSIS — E782 Mixed hyperlipidemia: Secondary | ICD-10-CM | POA: Diagnosis not present

## 2021-06-12 DIAGNOSIS — Z981 Arthrodesis status: Secondary | ICD-10-CM | POA: Diagnosis not present

## 2021-06-12 DIAGNOSIS — M5442 Lumbago with sciatica, left side: Secondary | ICD-10-CM | POA: Diagnosis not present

## 2021-06-14 ENCOUNTER — Other Ambulatory Visit: Payer: Self-pay

## 2021-06-14 ENCOUNTER — Emergency Department: Payer: PPO

## 2021-06-14 DIAGNOSIS — G44309 Post-traumatic headache, unspecified, not intractable: Secondary | ICD-10-CM | POA: Diagnosis not present

## 2021-06-14 DIAGNOSIS — Z85828 Personal history of other malignant neoplasm of skin: Secondary | ICD-10-CM | POA: Insufficient documentation

## 2021-06-14 DIAGNOSIS — E876 Hypokalemia: Secondary | ICD-10-CM | POA: Diagnosis not present

## 2021-06-14 DIAGNOSIS — S060X0A Concussion without loss of consciousness, initial encounter: Secondary | ICD-10-CM | POA: Insufficient documentation

## 2021-06-14 DIAGNOSIS — M5030 Other cervical disc degeneration, unspecified cervical region: Secondary | ICD-10-CM | POA: Diagnosis not present

## 2021-06-14 DIAGNOSIS — Z79899 Other long term (current) drug therapy: Secondary | ICD-10-CM | POA: Diagnosis not present

## 2021-06-14 DIAGNOSIS — M5481 Occipital neuralgia: Secondary | ICD-10-CM | POA: Diagnosis not present

## 2021-06-14 DIAGNOSIS — R1111 Vomiting without nausea: Secondary | ICD-10-CM | POA: Diagnosis not present

## 2021-06-14 DIAGNOSIS — M542 Cervicalgia: Secondary | ICD-10-CM | POA: Diagnosis not present

## 2021-06-14 DIAGNOSIS — Z8719 Personal history of other diseases of the digestive system: Secondary | ICD-10-CM | POA: Diagnosis not present

## 2021-06-14 DIAGNOSIS — E782 Mixed hyperlipidemia: Secondary | ICD-10-CM | POA: Diagnosis not present

## 2021-06-14 DIAGNOSIS — W01198A Fall on same level from slipping, tripping and stumbling with subsequent striking against other object, initial encounter: Secondary | ICD-10-CM | POA: Insufficient documentation

## 2021-06-14 DIAGNOSIS — M1990 Unspecified osteoarthritis, unspecified site: Secondary | ICD-10-CM | POA: Diagnosis not present

## 2021-06-14 DIAGNOSIS — W19XXXA Unspecified fall, initial encounter: Secondary | ICD-10-CM | POA: Diagnosis not present

## 2021-06-14 DIAGNOSIS — R079 Chest pain, unspecified: Secondary | ICD-10-CM | POA: Diagnosis not present

## 2021-06-14 DIAGNOSIS — S0003XA Contusion of scalp, initial encounter: Secondary | ICD-10-CM | POA: Insufficient documentation

## 2021-06-14 DIAGNOSIS — F419 Anxiety disorder, unspecified: Secondary | ICD-10-CM | POA: Diagnosis not present

## 2021-06-14 DIAGNOSIS — W108XXD Fall (on) (from) other stairs and steps, subsequent encounter: Secondary | ICD-10-CM | POA: Diagnosis not present

## 2021-06-14 DIAGNOSIS — M40204 Unspecified kyphosis, thoracic region: Secondary | ICD-10-CM | POA: Diagnosis not present

## 2021-06-14 DIAGNOSIS — Z882 Allergy status to sulfonamides status: Secondary | ICD-10-CM | POA: Diagnosis not present

## 2021-06-14 DIAGNOSIS — M545 Low back pain, unspecified: Secondary | ICD-10-CM | POA: Diagnosis not present

## 2021-06-14 DIAGNOSIS — R110 Nausea: Secondary | ICD-10-CM | POA: Diagnosis not present

## 2021-06-14 DIAGNOSIS — M546 Pain in thoracic spine: Secondary | ICD-10-CM | POA: Diagnosis not present

## 2021-06-14 DIAGNOSIS — M2578 Osteophyte, vertebrae: Secondary | ICD-10-CM | POA: Diagnosis not present

## 2021-06-14 DIAGNOSIS — R270 Ataxia, unspecified: Secondary | ICD-10-CM | POA: Diagnosis not present

## 2021-06-14 DIAGNOSIS — R42 Dizziness and giddiness: Secondary | ICD-10-CM | POA: Diagnosis not present

## 2021-06-14 DIAGNOSIS — R112 Nausea with vomiting, unspecified: Secondary | ICD-10-CM | POA: Diagnosis not present

## 2021-06-14 DIAGNOSIS — R519 Headache, unspecified: Secondary | ICD-10-CM | POA: Diagnosis not present

## 2021-06-14 DIAGNOSIS — Z886 Allergy status to analgesic agent status: Secondary | ICD-10-CM | POA: Diagnosis not present

## 2021-06-14 DIAGNOSIS — Z803 Family history of malignant neoplasm of breast: Secondary | ICD-10-CM | POA: Diagnosis not present

## 2021-06-14 DIAGNOSIS — N3946 Mixed incontinence: Secondary | ICD-10-CM | POA: Diagnosis not present

## 2021-06-14 DIAGNOSIS — K5190 Ulcerative colitis, unspecified, without complications: Secondary | ICD-10-CM | POA: Diagnosis not present

## 2021-06-14 DIAGNOSIS — F0781 Postconcussional syndrome: Secondary | ICD-10-CM | POA: Diagnosis not present

## 2021-06-14 DIAGNOSIS — H8113 Benign paroxysmal vertigo, bilateral: Secondary | ICD-10-CM | POA: Diagnosis not present

## 2021-06-14 DIAGNOSIS — I10 Essential (primary) hypertension: Secondary | ICD-10-CM | POA: Diagnosis not present

## 2021-06-14 DIAGNOSIS — Z8616 Personal history of COVID-19: Secondary | ICD-10-CM | POA: Diagnosis not present

## 2021-06-14 DIAGNOSIS — S199XXA Unspecified injury of neck, initial encounter: Secondary | ICD-10-CM | POA: Diagnosis not present

## 2021-06-14 DIAGNOSIS — Z90710 Acquired absence of both cervix and uterus: Secondary | ICD-10-CM | POA: Diagnosis not present

## 2021-06-14 DIAGNOSIS — R52 Pain, unspecified: Secondary | ICD-10-CM | POA: Diagnosis not present

## 2021-06-14 DIAGNOSIS — W109XXS Fall (on) (from) unspecified stairs and steps, sequela: Secondary | ICD-10-CM | POA: Diagnosis present

## 2021-06-14 MED ORDER — ONDANSETRON HCL 4 MG/2ML IJ SOLN
4.0000 mg | Freq: Once | INTRAMUSCULAR | Status: AC
Start: 2021-06-14 — End: 2021-06-14
  Administered 2021-06-14: 4 mg via INTRAVENOUS
  Filled 2021-06-14: qty 2

## 2021-06-14 NOTE — ED Notes (Signed)
Daughter with pt in triage middle ?

## 2021-06-14 NOTE — ED Triage Notes (Addendum)
Pt was coming home with family from restaurant, (did not drink alcohol today) and had fall from the bottom stair backwards. Pt has hematoma with abrasion on back of head. Pt c/o neck pain and bilateral shoulder pain. Pt denies LOC, denies CP, SOB. Pt c/o of some nausea. Pt family states she was "dazed" after the fall. Pt is AOX4, NAD noted. C-collar applied in triage. EMS denies p[oint tenderness down spine- report she had spinal fusion in December. Pt does not take blood thinners. No obvious deformities noted.  ?

## 2021-06-15 ENCOUNTER — Other Ambulatory Visit: Payer: Self-pay

## 2021-06-15 ENCOUNTER — Emergency Department: Payer: PPO

## 2021-06-15 ENCOUNTER — Inpatient Hospital Stay
Admission: EM | Admit: 2021-06-15 | Discharge: 2021-06-23 | DRG: 103 | Disposition: A | Discharge status: Home Health | Payer: PPO | Attending: Internal Medicine | Admitting: Internal Medicine

## 2021-06-15 ENCOUNTER — Emergency Department
Admission: EM | Admit: 2021-06-15 | Discharge: 2021-06-15 | Disposition: A | Discharge status: Home / Self Care | Payer: PPO | Source: Home / Self Care | Attending: Emergency Medicine | Admitting: Emergency Medicine

## 2021-06-15 DIAGNOSIS — F419 Anxiety disorder, unspecified: Secondary | ICD-10-CM | POA: Diagnosis present

## 2021-06-15 DIAGNOSIS — E782 Mixed hyperlipidemia: Secondary | ICD-10-CM | POA: Diagnosis present

## 2021-06-15 DIAGNOSIS — H8113 Benign paroxysmal vertigo, bilateral: Secondary | ICD-10-CM | POA: Diagnosis present

## 2021-06-15 DIAGNOSIS — N3946 Mixed incontinence: Secondary | ICD-10-CM | POA: Diagnosis present

## 2021-06-15 DIAGNOSIS — Z85828 Personal history of other malignant neoplasm of skin: Secondary | ICD-10-CM

## 2021-06-15 DIAGNOSIS — W108XXD Fall (on) (from) other stairs and steps, subsequent encounter: Secondary | ICD-10-CM

## 2021-06-15 DIAGNOSIS — W109XXS Fall (on) (from) unspecified stairs and steps, sequela: Secondary | ICD-10-CM | POA: Diagnosis present

## 2021-06-15 DIAGNOSIS — R42 Dizziness and giddiness: Secondary | ICD-10-CM

## 2021-06-15 DIAGNOSIS — M5481 Occipital neuralgia: Secondary | ICD-10-CM | POA: Diagnosis present

## 2021-06-15 DIAGNOSIS — F0781 Postconcussional syndrome: Principal | ICD-10-CM | POA: Diagnosis present

## 2021-06-15 DIAGNOSIS — R112 Nausea with vomiting, unspecified: Secondary | ICD-10-CM | POA: Diagnosis present

## 2021-06-15 DIAGNOSIS — Z886 Allergy status to analgesic agent status: Secondary | ICD-10-CM

## 2021-06-15 DIAGNOSIS — M1990 Unspecified osteoarthritis, unspecified site: Secondary | ICD-10-CM | POA: Diagnosis present

## 2021-06-15 DIAGNOSIS — W19XXXA Unspecified fall, initial encounter: Secondary | ICD-10-CM

## 2021-06-15 DIAGNOSIS — Z8616 Personal history of COVID-19: Secondary | ICD-10-CM

## 2021-06-15 DIAGNOSIS — M5030 Other cervical disc degeneration, unspecified cervical region: Secondary | ICD-10-CM | POA: Diagnosis present

## 2021-06-15 DIAGNOSIS — E876 Hypokalemia: Secondary | ICD-10-CM | POA: Diagnosis present

## 2021-06-15 DIAGNOSIS — Z9889 Other specified postprocedural states: Secondary | ICD-10-CM

## 2021-06-15 DIAGNOSIS — Z79899 Other long term (current) drug therapy: Secondary | ICD-10-CM

## 2021-06-15 DIAGNOSIS — S060XAA Concussion with loss of consciousness status unknown, initial encounter: Secondary | ICD-10-CM | POA: Diagnosis present

## 2021-06-15 DIAGNOSIS — Z803 Family history of malignant neoplasm of breast: Secondary | ICD-10-CM

## 2021-06-15 DIAGNOSIS — S060X0A Concussion without loss of consciousness, initial encounter: Secondary | ICD-10-CM

## 2021-06-15 DIAGNOSIS — Z8719 Personal history of other diseases of the digestive system: Secondary | ICD-10-CM

## 2021-06-15 DIAGNOSIS — I10 Essential (primary) hypertension: Secondary | ICD-10-CM | POA: Diagnosis present

## 2021-06-15 DIAGNOSIS — Z90710 Acquired absence of both cervix and uterus: Secondary | ICD-10-CM

## 2021-06-15 DIAGNOSIS — Z882 Allergy status to sulfonamides status: Secondary | ICD-10-CM

## 2021-06-15 DIAGNOSIS — G44309 Post-traumatic headache, unspecified, not intractable: Secondary | ICD-10-CM | POA: Diagnosis present

## 2021-06-15 DIAGNOSIS — S0003XA Contusion of scalp, initial encounter: Secondary | ICD-10-CM

## 2021-06-15 DIAGNOSIS — S060X0D Concussion without loss of consciousness, subsequent encounter: Secondary | ICD-10-CM

## 2021-06-15 DIAGNOSIS — K5190 Ulcerative colitis, unspecified, without complications: Secondary | ICD-10-CM | POA: Diagnosis present

## 2021-06-15 MED ORDER — HYDROCODONE-ACETAMINOPHEN 5-325 MG PO TABS: 1.0000 | ORAL_TABLET | Freq: Three times a day (TID) | ORAL | 0 refills | Status: DC | PRN

## 2021-06-15 MED ORDER — SODIUM CHLORIDE 0.9 % IV BOLUS
1000.0000 mL | Freq: Once | INTRAVENOUS | Status: AC
  Administered 2021-06-16: 1000 mL via INTRAVENOUS

## 2021-06-15 MED ORDER — HYDROCODONE-ACETAMINOPHEN 5-325 MG PO TABS
1.0000 | ORAL_TABLET | Freq: Once | ORAL | Status: AC
  Administered 2021-06-15: 1 via ORAL
  Filled 2021-06-15: qty 1

## 2021-06-15 MED ORDER — ONDANSETRON HCL 4 MG/2ML IJ SOLN
4.0000 mg | Freq: Once | INTRAMUSCULAR | Status: AC
  Administered 2021-06-15: 4 mg via INTRAVENOUS
  Filled 2021-06-15: qty 2

## 2021-06-15 MED ORDER — ONDANSETRON 4 MG PO TBDP: 4.0000 mg | ORAL_TABLET | Freq: Three times a day (TID) | ORAL | 0 refills | Status: DC | PRN

## 2021-06-15 MED ORDER — ONDANSETRON HCL 4 MG/2ML IJ SOLN
4.0000 mg | Freq: Once | INTRAMUSCULAR | Status: AC
  Administered 2021-06-16: 4 mg via INTRAVENOUS
  Filled 2021-06-15: qty 2

## 2021-06-15 NOTE — ED Provider Notes (Signed)
? ?[facility] ?Provider Note ? ? ? Event Date/Time  ? First MD Initiated Contact with Patient 06/15/21 2311   ?  (approximate) ? ? ?History  ? ?Nausea ? ? ?HPI ? ?April Larsen is a 76 y.o. female who returns to the ED from home with persistent headache, nausea and vomiting, unable to tolerate PO.  Patient was seen in the ED less than 24 hours ago status post fall without LOC but she did strike her head.  CT was negative for intracranial hemorrhage.  Patient does not take anticoagulants.  Patient was recommended for admission due to nausea, vomiting and safety concerns for discharge home but patient desired discharge.  States she has been dizzy with headache and vomiting all day.  Unable to perform ADLs even with family assistance.  Denies fever, cough, chest pain, shortness of breath, abdominal pain. ?  ? ? ?Past Medical History  ? ?Past Medical History:  ?Diagnosis Date  ? Actinic keratosis   ? Anemia   ? LAST HGB 15.2 ON 04-22-16  ? Anxiety   ? Arthritis   ? Cancer [facility]) 2016  ? skin  ? Cataract (lens) fragments in eye following cataract surgery, bilateral 2013  ? one eye done then 4 weeks later the other eye done.  ? Chronic ulcerative colitis, without complications ([place]) 4/0/1027  ? Diverticulitis   ? Esophagitis, reflux 10/04/2014  ? Family history of adverse reaction to anesthesia   ? SISTER HARD TO WAKE UP and nausea and vomiting.  ? Female stress incontinence 08/09/2013  ? Gastroesophageal reflux disease without esophagitis 08/06/2016  ? GERD (gastroesophageal reflux disease)   ? Headache   ? MIGRAINE  ? History of hiatal hernia   ? Incomplete emptying of bladder 10/29/2013  ? Migraine 07/26/2013  ? PONV (postoperative nausea and vomiting)   ? nausea, does not remember vomiting.  ? Postherpetic neuralgia   ? RIGHT 7TH.CRANIAL NERVE from shingles followed by Bell's palsy  ? Postmenopausal 11/09/2016  ? PVC (premature ventricular contraction)   ? Scoliosis   ? Scoliosis (and kyphoscoliosis),  idiopathic 07/26/2013  ? Scoliosis of lumbosacral spine 01/18/2015  ? Squamous cell carcinoma of skin 03/13/2013  ? L clavicle   ? Squamous cell carcinoma of skin 03/10/2018  ? L med knee  ? Squamous cell carcinoma of skin 02/21/2019  ? R cheek   ? Squamous cell carcinoma of skin 06/01/2019  ? L mid lat pretibial - ED&C  ? Trochanteric bursitis 11/29/2014  ? Ulcerative colitis ([place])   ? Urethral prolapse 08/09/2013  ? Urge incontinence 08/09/2013  ? ? ? ?Active Problem List  ? ?Patient Active Problem List  ? Diagnosis Date Noted  ? Concussion 06/16/2021  ? Vertigo 06/16/2021  ? Spinal surgery in prior 3 months for correction of scoliosis 06/16/2021  ? Fall (on) (from) other stairs and steps 06/14/21, subsequent encounter 06/16/2021  ? Essential hypertension 10/08/2020  ? Mild dementia 06/12/2020  ? History of repair of paraesophageal hernia 02/27/2020  ? Hiatal hernia   ? Stomach irritation   ? Primary osteoarthritis of right knee 01/05/2020  ? Generalized osteoarthritis 04/13/2019  ? Osteopenia of multiple sites 04/13/2019  ? History of 2019 novel coronavirus disease (COVID-19) 01/19/2019  ? Pneumonia due to COVID-19 virus 01/09/2019  ? Acute respiratory failure with hypoxia ([place]) 01/09/2019  ? Major depressive disorder, recurrent, mild ([place]) 12/08/2018  ? Hyperlipidemia, mixed 11/19/2017  ? Diverticulitis of large intestine without perforation or abscess without bleeding 05/18/2017  ?  Medicare annual wellness visit, initial 11/11/2016  ? Postmenopausal 11/09/2016  ? Diverticulitis 08/06/2016  ? Gastroesophageal reflux disease without esophagitis 08/06/2016  ? Chronic ulcerative colitis, without complications ([place]) 57/84/6962  ? Scoliosis of lumbosacral spine 01/18/2015  ? Trochanteric bursitis 11/29/2014  ? Esophagitis, reflux 10/04/2014  ? Incomplete emptying of bladder 10/29/2013  ? Female stress incontinence 08/09/2013  ? Urethral prolapse 08/09/2013  ? Urge incontinence 08/09/2013  ? Migraine 07/26/2013  ? Scoliosis  (and kyphoscoliosis), idiopathic 07/26/2013  ? Ulcerative colitis ([place]) 07/26/2013  ? ? ? ?Past Surgical History  ? ?Past Surgical History:  ?Procedure Laterality Date  ? ABDOMINAL HYSTERECTOMY    ? BACK SURGERY    ? L3-L4 AND L3-S1 DECOMPRESSION  ? CARPAL TUNNEL RELEASE    ? CHOLECYSTECTOMY N/A 08/21/2016  ? Procedure: LAPAROSCOPIC CHOLECYSTECTOMY WITH INTRAOPERATIVE CHOLANGIOGRAM;  Surgeon: Leonie Green, MD;  Location: [facility] ORS;  Service: General;  Laterality: N/A;  ? COLON SURGERY    ? COLONOSCOPY  11/06/2005  ? COLONOSCOPY WITH PROPOFOL N/A 07/29/2016  ? Procedure: COLONOSCOPY WITH PROPOFOL;  Surgeon: Manya Silvas, MD;  [facility] ENDOSCOPY;  Service: Endoscopy;  Laterality: N/A;  ? COLONOSCOPY WITH PROPOFOL N/A 08/22/2020  ? Procedure: COLONOSCOPY WITH PROPOFOL;  Surgeon: Virgel Manifold, MD;  Location: [facility] ENDOSCOPY;  Service: Endoscopy;  Laterality: N/A;  ? ESOPHAGOGASTRODUODENOSCOPY  11/06/2005  ? ESOPHAGOGASTRODUODENOSCOPY (EGD) WITH PROPOFOL N/A 07/29/2016  ? Procedure: ESOPHAGOGASTRODUODENOSCOPY (EGD) WITH PROPOFOL;  Surgeon: Manya Silvas, MD;  Location: [facility] ENDOSCOPY;  Service: Endoscopy;  Laterality: N/A;  ? ESOPHAGOGASTRODUODENOSCOPY (EGD) WITH PROPOFOL N/A 02/07/2020  ? Procedure: ESOPHAGOGASTRODUODENOSCOPY (EGD) WITH PROPOFOL;  Surgeon: Virgel Manifold, MD;  Location: [facility] ENDOSCOPY;  Service: Endoscopy;  Laterality: N/A;  ? LAPAROSCOPIC LOW ANTERIOR RESECTION  11/24/2016  ? Procedure: LAPAROSCOPIC LOW ANTERIOR RESECTION;  Surgeon: Jules Husbands, MD;  Location: [facility] ORS;  Service: General;;  ? XI ROBOTIC ASSISTED PARAESOPHAGEAL HERNIA REPAIR N/A 02/27/2020  ? Procedure: XI ROBOTIC ASSISTED PARAESOPHAGEAL HERNIA REPAIR;  Surgeon: Jules Husbands, MD;  Location: [facility] ORS;  Service: General;  Laterality: N/A;  ? ? ? ?Home Medications  ? ?Prior to Admission medications   ?Medication Sig Start Date End Date Taking? Authorizing Provider  ?acetaminophen (TYLENOL) 500 MG tablet Take  1,000 mg by mouth every 6 (six) hours as needed for moderate pain or headache.     [provider]  ?B Complex-C (B-COMPLEX WITH VITAMIN C) tablet Take 1 tablet by mouth daily.    [provider]  ?buPROPion (WELLBUTRIN XL) 150 MG 24 hr tablet Take 150 mg by mouth daily. 12/08/18 10/01/20  [provider]  ?Calcium Carbonate-Vitamin D (CALCIUM 600+D PO) Take 1 tablet by mouth 2 (two) times daily.    [provider]  ?Cholecalciferol (VITAMIN D3) 50 MCG (2000 UT) TABS Take 2,000 Units by mouth daily.    [provider]  ?colestipol (COLESTID) 1 g tablet Take 3 tablets (3 g total) by mouth 2 (two) times daily. 12/18/20 03/18/21  Virgel Manifold, MD  ?DULoxetine (CYMBALTA) 60 MG capsule Take 60 mg by mouth at bedtime.  06/17/15   [provider]  ?furosemide (LASIX) 20 MG tablet Take 1 tablet by mouth daily as needed. 08/02/20 08/02/21  [provider]  ?HYDROcodone-acetaminophen (NORCO) 5-325 MG tablet Take 1 tablet by mouth every 8 (eight) hours as needed for up to 5 days for severe pain. 06/15/21 06/20/21  Lucrezia Starch, MD  ?mesalamine (APRISO) 0.375 g 24 hr capsule  Take 4 capsules (1.5 g total) by mouth daily. 10/17/20 01/15/21  Virgel Manifold, MD  ?ondansetron (ZOFRAN-ODT) 4 MG disintegrating tablet Take 1 tablet (4 mg total) by mouth every 8 (eight) hours as needed for up to 3 days for nausea or vomiting. 06/15/21 06/18/21  Lucrezia Starch, MD  ?pantoprazole (PROTONIX) 40 MG tablet Take 40 mg by mouth daily.    [provider]  ?triamterene-hydrochlorothiazide (DYAZIDE) 37.5-25 MG capsule Take 1 capsule by mouth every morning. 09/04/20   [provider]  ? ? ? ?Allergies  ?Nsaids and Sulfa antibiotics ? ? ?Family History  ? ?Family History  ?Problem Relation Age of Onset  ? Breast cancer Sister 99  ? Breast cancer Paternal Aunt 87  ? Breast cancer Cousin   ?     1st paternal cousin  ? Bladder Cancer Neg Hx   ? Prostate cancer Neg Hx    ? Kidney cancer Neg Hx   ? ? ? ?Physical Exam  ?Triage Vital Signs: ?ED Triage Vitals  ?Enc Vitals Group  ?   BP 06/15/21 2149 (!) 143/80  ?   Pulse Rate 06/15/21 2149 (!) 109  ?   Resp 06/15/21 214

## 2021-06-15 NOTE — ED Provider Notes (Signed)
? ?[facility] ?Provider Note ? ? ? Event Date/Time  ? First MD Initiated Contact with Patient 06/15/21 0008   ?  (approximate) ? ? ?History  ? ?Fall ? ? ?HPI ? ?April Larsen is a 76 y.o. female with a past medical history of anemia, anxiety, arthritis, skin cancer, migraine headaches, chronic urinary incontinence and scoliosis status post thoracic fusion surgery last year dependent on walker at baseline who presents for evaluation after a fall.  Patient states she is moving too quickly and using a cane when she is post using a walker and lost her footing falling hitting the back of her head.  No LOC.  She is not on any blood thinners.  She states she felt a little "shook up" afterwards and has had some nausea but no vomiting.  She states her entire back hurts into her shoulders.  She has not had any chest pain, abdominal pain or extremity pain is new.  No other recent sick symptoms such as fevers, cough, nausea, vomiting, diarrhea, rash, urinary symptoms, dizziness, vertigo or any other symptoms preceding his fall today.  No other recent trauma. ? ? ? ?Past Medical History:  ?Diagnosis Date  ? Actinic keratosis   ? Anemia   ? LAST HGB 15.2 ON 04-22-16  ? Anxiety   ? Arthritis   ? Cancer [facility]) 2016  ? skin  ? Cataract (lens) fragments in eye following cataract surgery, bilateral 2013  ? one eye done then 4 weeks later the other eye done.  ? Chronic ulcerative colitis, without complications ([place]) 04/05/6158  ? Diverticulitis   ? Esophagitis, reflux 10/04/2014  ? Family history of adverse reaction to anesthesia   ? SISTER HARD TO WAKE UP and nausea and vomiting.  ? Female stress incontinence 08/09/2013  ? Gastroesophageal reflux disease without esophagitis 08/06/2016  ? GERD (gastroesophageal reflux disease)   ? Headache   ? MIGRAINE  ? History of hiatal hernia   ? Incomplete emptying of bladder 10/29/2013  ? Migraine 07/26/2013  ? PONV (postoperative nausea and vomiting)   ? nausea, does not remember  vomiting.  ? Postherpetic neuralgia   ? RIGHT 7TH.CRANIAL NERVE from shingles followed by Bell's palsy  ? Postmenopausal 11/09/2016  ? PVC (premature ventricular contraction)   ? Scoliosis   ? Scoliosis (and kyphoscoliosis), idiopathic 07/26/2013  ? Scoliosis of lumbosacral spine 01/18/2015  ? Squamous cell carcinoma of skin 03/13/2013  ? L clavicle   ? Squamous cell carcinoma of skin 03/10/2018  ? L med knee  ? Squamous cell carcinoma of skin 02/21/2019  ? R cheek   ? Squamous cell carcinoma of skin 06/01/2019  ? L mid lat pretibial - ED&C  ? Trochanteric bursitis 11/29/2014  ? Ulcerative colitis ([place])   ? Urethral prolapse 08/09/2013  ? Urge incontinence 08/09/2013  ? ? ? ?  ?Past Surgical History:  ?Procedure Laterality Date  ? ABDOMINAL HYSTERECTOMY    ? BACK SURGERY    ? L3-L4 AND L3-S1 DECOMPRESSION  ? CARPAL TUNNEL RELEASE    ? CHOLECYSTECTOMY N/A 08/21/2016  ? Procedure: LAPAROSCOPIC CHOLECYSTECTOMY WITH INTRAOPERATIVE CHOLANGIOGRAM;  Surgeon: Leonie Green, MD;  Location: [facility] ORS;  Service: General;  Laterality: N/A;  ? COLON SURGERY    ? COLONOSCOPY  11/06/2005  ? COLONOSCOPY WITH PROPOFOL N/A 07/29/2016  ? Procedure: COLONOSCOPY WITH PROPOFOL;  Surgeon: Manya Silvas, MD;  Location: [facility] ENDOSCOPY;  Service: Endoscopy;  Laterality: N/A;  ? COLONOSCOPY WITH PROPOFOL N/A 08/22/2020  ?  Procedure: COLONOSCOPY WITH PROPOFOL;  Surgeon: Virgel Manifold, MD;  Location: [facility] ENDOSCOPY;  Service: Endoscopy;  Laterality: N/A;  ? ESOPHAGOGASTRODUODENOSCOPY  11/06/2005  ? ESOPHAGOGASTRODUODENOSCOPY (EGD) WITH PROPOFOL N/A 07/29/2016  ? Procedure: ESOPHAGOGASTRODUODENOSCOPY (EGD) WITH PROPOFOL;  Surgeon: Manya Silvas, MD;  Location: [facility] ENDOSCOPY;  Service: Endoscopy;  Laterality: N/A;  ? ESOPHAGOGASTRODUODENOSCOPY (EGD) WITH PROPOFOL N/A 02/07/2020  ? Procedure: ESOPHAGOGASTRODUODENOSCOPY (EGD) WITH PROPOFOL;  Surgeon: Virgel Manifold, MD;  Location: [facility] ENDOSCOPY;  Service: Endoscopy;  Laterality: N/A;   ? LAPAROSCOPIC LOW ANTERIOR RESECTION  11/24/2016  ? Procedure: LAPAROSCOPIC LOW ANTERIOR RESECTION;  Surgeon: Jules Husbands, MD;  Location: [facility] ORS;  Service: General;;  ? XI ROBOTIC ASSISTED PARAESOPHAGEAL HERNIA REPAIR N/A 02/27/2020  ? Procedure: XI ROBOTIC ASSISTED PARAESOPHAGEAL HERNIA REPAIR;  Surgeon: Jules Husbands, MD;  Location: [facility] ORS;  Service: General;  Laterality: N/A;  ? ? ? ?Physical Exam  ?Triage Vital Signs: ?ED Triage Vitals  ?Enc Vitals Group  ?   BP 06/14/21 2242 130/67  ?   Pulse Rate 06/14/21 2242 (!) 102  ?   Resp 06/14/21 2242 18  ?   Temp 06/14/21 2242 97.6 ?F (36.4 ?C)  ?   Temp src --   ?   SpO2 06/14/21 2242 96 %  ?   Weight 06/14/21 2240 159 lb (72.1 kg)  ?   Height --   ?   Head Circumference --   ?   Peak Flow --   ?   Pain Score 06/14/21 2240 9  ?   Pain Loc --   ?   Pain Edu? --   ?   Excl. in [place]? --   ? ? ?Most recent vital signs: ?Vitals:  ? 06/14/21 2242  ?BP: 130/67  ?Pulse: (!) 102  ?Resp: 18  ?Temp: 97.6 ?F (36.4 ?C)  ?SpO2: 96%  ? ? ?General: Awake, appears uncomfortable. ?CV:  Good peripheral perfusion.  2+ radial pulses.  No murmur. ?Resp:  Normal effort.  Clear bilaterally ?Abd:  No distention.  Soft throughout. ?Other:  There is a contusion with some hematoma over the occiput.  No other obvious trauma to the face scalp head or neck.  Cranial nerves II to XII are grossly intact.  Some mild tenderness along the C/T and L-spine without any overlying step-offs or other skin changes.  Patient has symmetric strength and sensation throughout all extremities.  2+ radial pulses. ? ? ?ED Results / Procedures / Treatments  ?Labs ?(all labs ordered are listed, but only abnormal results are displayed) ?Labs Reviewed - No data to display ? ? ?EKG ? ?EKG is remarkable sinus rhythm with a ventricular rate of 100 with nonspecific ST change in V3 and Q-wave in lead III previously noted from ECG from 02/02/2020 without any other clearance of acute ischemia or  arrhythmia. ? ? ?RADIOLOGY ? ?CT head and C-spine interpreted by myself without evidence of skull fracture, intracranial hemorrhage, ischemia, mass effect or other acute process.  I do not see any evidence of acute C-spine injury.  I also reviewed radiology's interpretation and agree with their findings of small right parietal scalp hematoma and some degenerative disc disease and joint disease with foraminal narrowing and moderate canal stenosis in the C-spine. ? ?Chest reviewed by myself shows no focal consoidation, effusion, edema, pneumothorax or other clear acute thoracic process. I also reviewed radiology interpretation and agree with findings described. ? ?Plain films of the T and L-spine interpretation without evidence of fracture  dislocation or hardware complication.  Fusion hardware appears in place.  As reviewed radiology interpretation and agree with the findings of same.  ? ? ? ?PROCEDURES: ? ?Critical Care performed: No ? ?.1-3 Lead EKG Interpretation ?Performed by: Lucrezia Starch, MD ?Authorized by: Lucrezia Starch, MD  ? ?  Interpretation: normal   ?  ECG rate assessment: normal   ?  Rhythm: sinus rhythm   ?  Ectopy: none   ?  Conduction: normal   ? ?The patient is on the cardiac monitor to evaluate for evidence of arrhythmia and/or significant heart rate changes. ? ? ?MEDICATIONS ORDERED IN ED: ?Medications  ?ondansetron (ZOFRAN) injection 4 mg (4 mg Intravenous Given 06/14/21 2308)  ?ondansetron [facility]) injection 4 mg (4 mg Intravenous Given 06/15/21 0105)  ?HYDROcodone-acetaminophen (NORCO/VICODIN) 5-325 MG per tablet 1 tablet (1 tablet Oral Given 06/15/21 0156)  ? ? ? ?IMPRESSION / MDM / ASSESSMENT AND PLAN / ED COURSE  ?I reviewed the triage vital signs and the nursing notes. ?             ?               ? ?Differential diagnosis includes, but is not limited to concussion and posterior scalp hematoma versus underlying skull fracture or intracranial hemorrhage, occult C-spine injury and possible  spinal fracture or hardware complication from recent fusion.  She is otherwise neurovascular intact and denying any other acute pain Avila suspicion for other significant orthopedic or visceral trauma.  She describes a clear m

## 2021-06-15 NOTE — ED Triage Notes (Signed)
Nausea/vomiting. Pt seen here yesterday for fall and D/c home. Woke up last night vomiting after getting home and has had multiple episodes today. ?

## 2021-06-16 ENCOUNTER — Inpatient Hospital Stay: Payer: PPO

## 2021-06-16 ENCOUNTER — Emergency Department: Payer: PPO

## 2021-06-16 ENCOUNTER — Observation Stay: Payer: PPO

## 2021-06-16 DIAGNOSIS — E876 Hypokalemia: Secondary | ICD-10-CM

## 2021-06-16 DIAGNOSIS — H8113 Benign paroxysmal vertigo, bilateral: Secondary | ICD-10-CM | POA: Diagnosis present

## 2021-06-16 DIAGNOSIS — M5030 Other cervical disc degeneration, unspecified cervical region: Secondary | ICD-10-CM | POA: Diagnosis present

## 2021-06-16 DIAGNOSIS — R112 Nausea with vomiting, unspecified: Secondary | ICD-10-CM

## 2021-06-16 DIAGNOSIS — W108XXD Fall (on) (from) other stairs and steps, subsequent encounter: Secondary | ICD-10-CM | POA: Diagnosis not present

## 2021-06-16 DIAGNOSIS — N3946 Mixed incontinence: Secondary | ICD-10-CM | POA: Diagnosis present

## 2021-06-16 DIAGNOSIS — Z8616 Personal history of COVID-19: Secondary | ICD-10-CM | POA: Diagnosis not present

## 2021-06-16 DIAGNOSIS — M1990 Unspecified osteoarthritis, unspecified site: Secondary | ICD-10-CM | POA: Diagnosis present

## 2021-06-16 DIAGNOSIS — Z803 Family history of malignant neoplasm of breast: Secondary | ICD-10-CM | POA: Diagnosis not present

## 2021-06-16 DIAGNOSIS — Z79899 Other long term (current) drug therapy: Secondary | ICD-10-CM | POA: Diagnosis not present

## 2021-06-16 DIAGNOSIS — R42 Dizziness and giddiness: Secondary | ICD-10-CM

## 2021-06-16 DIAGNOSIS — Z8719 Personal history of other diseases of the digestive system: Secondary | ICD-10-CM | POA: Diagnosis not present

## 2021-06-16 DIAGNOSIS — Z886 Allergy status to analgesic agent status: Secondary | ICD-10-CM | POA: Diagnosis not present

## 2021-06-16 DIAGNOSIS — W109XXS Fall (on) (from) unspecified stairs and steps, sequela: Secondary | ICD-10-CM | POA: Diagnosis present

## 2021-06-16 DIAGNOSIS — E782 Mixed hyperlipidemia: Secondary | ICD-10-CM | POA: Diagnosis present

## 2021-06-16 DIAGNOSIS — F0781 Postconcussional syndrome: Secondary | ICD-10-CM | POA: Diagnosis present

## 2021-06-16 DIAGNOSIS — G44309 Post-traumatic headache, unspecified, not intractable: Secondary | ICD-10-CM | POA: Diagnosis present

## 2021-06-16 DIAGNOSIS — Z85828 Personal history of other malignant neoplasm of skin: Secondary | ICD-10-CM | POA: Diagnosis not present

## 2021-06-16 DIAGNOSIS — K5190 Ulcerative colitis, unspecified, without complications: Secondary | ICD-10-CM | POA: Diagnosis present

## 2021-06-16 DIAGNOSIS — I10 Essential (primary) hypertension: Secondary | ICD-10-CM | POA: Diagnosis present

## 2021-06-16 DIAGNOSIS — Z90710 Acquired absence of both cervix and uterus: Secondary | ICD-10-CM | POA: Diagnosis not present

## 2021-06-16 DIAGNOSIS — Z9889 Other specified postprocedural states: Secondary | ICD-10-CM

## 2021-06-16 DIAGNOSIS — Z882 Allergy status to sulfonamides status: Secondary | ICD-10-CM | POA: Diagnosis not present

## 2021-06-16 DIAGNOSIS — R270 Ataxia, unspecified: Secondary | ICD-10-CM | POA: Diagnosis not present

## 2021-06-16 DIAGNOSIS — S060XAA Concussion with loss of consciousness status unknown, initial encounter: Secondary | ICD-10-CM | POA: Diagnosis present

## 2021-06-16 DIAGNOSIS — F419 Anxiety disorder, unspecified: Secondary | ICD-10-CM | POA: Diagnosis present

## 2021-06-16 DIAGNOSIS — R519 Headache, unspecified: Secondary | ICD-10-CM | POA: Diagnosis not present

## 2021-06-16 DIAGNOSIS — M5481 Occipital neuralgia: Secondary | ICD-10-CM | POA: Diagnosis present

## 2021-06-16 LAB — CBC WITH DIFFERENTIAL/PLATELET
Abs Immature Granulocytes: 0.02 10*3/uL (ref 0.00–0.07)
Basophils Absolute: 0.1 10*3/uL (ref 0.0–0.1)
Basophils Relative: 1 %
Eosinophils Absolute: 0.1 10*3/uL (ref 0.0–0.5)
Eosinophils Relative: 1 %
HCT: 37.4 % (ref 36.0–46.0)
Hemoglobin: 12.3 g/dL (ref 12.0–15.0)
Immature Granulocytes: 0 %
Lymphocytes Relative: 9 %
Lymphs Abs: 0.7 10*3/uL (ref 0.7–4.0)
MCH: 28.2 pg (ref 26.0–34.0)
MCHC: 32.9 g/dL (ref 30.0–36.0)
MCV: 85.8 fL (ref 80.0–100.0)
Monocytes Absolute: 0.9 10*3/uL (ref 0.1–1.0)
Monocytes Relative: 12 %
Neutro Abs: 6 10*3/uL (ref 1.7–7.7)
Neutrophils Relative %: 77 %
Platelets: 435 10*3/uL — ABNORMAL HIGH (ref 150–400)
RBC: 4.36 MIL/uL (ref 3.87–5.11)
RDW: 14.4 % (ref 11.5–15.5)
WBC: 7.8 10*3/uL (ref 4.0–10.5)
nRBC: 0 % (ref 0.0–0.2)

## 2021-06-16 LAB — COMPREHENSIVE METABOLIC PANEL
ALT: 16 U/L (ref 0–44)
AST: 25 U/L (ref 15–41)
Albumin: 3.5 g/dL (ref 3.5–5.0)
Alkaline Phosphatase: 108 U/L (ref 38–126)
Anion gap: 10 (ref 5–15)
BUN: 24 mg/dL — ABNORMAL HIGH (ref 8–23)
CO2: 25 mmol/L (ref 22–32)
Calcium: 9.6 mg/dL (ref 8.9–10.3)
Chloride: 105 mmol/L (ref 98–111)
Creatinine, Ser: 0.68 mg/dL (ref 0.44–1.00)
GFR, Estimated: >60 mL/min (ref >60)
Glucose, Bld: 123 mg/dL — ABNORMAL HIGH (ref 70–99)
Potassium: 2.7 mmol/L — CL (ref 3.5–5.1)
Sodium: 140 mmol/L (ref 135–145)
Total Bilirubin: 0.8 mg/dL (ref 0.3–1.2)
Total Protein: 7.2 g/dL (ref 6.5–8.1)

## 2021-06-16 LAB — URINALYSIS, ROUTINE W REFLEX MICROSCOPIC
Bilirubin Urine: NEGATIVE
Glucose, UA: NEGATIVE mg/dL
Hgb urine dipstick: NEGATIVE
Ketones, ur: 5 mg/dL — AB
Nitrite: NEGATIVE
Protein, ur: NEGATIVE mg/dL
Specific Gravity, Urine: 1.018 (ref 1.005–1.030)
pH: 5 (ref 5.0–8.0)

## 2021-06-16 LAB — TROPONIN I (HIGH SENSITIVITY)
Troponin I (High Sensitivity): 9 ng/L (ref <18)
Troponin I (High Sensitivity): 9 ng/L (ref <18)

## 2021-06-16 LAB — BASIC METABOLIC PANEL
Anion gap: 7 (ref 5–15)
BUN: 17 mg/dL (ref 8–23)
CO2: 23 mmol/L (ref 22–32)
Calcium: 9 mg/dL (ref 8.9–10.3)
Chloride: 110 mmol/L (ref 98–111)
Creatinine, Ser: 0.67 mg/dL (ref 0.44–1.00)
GFR, Estimated: >60 mL/min (ref >60)
Glucose, Bld: 164 mg/dL — ABNORMAL HIGH (ref 70–99)
Potassium: 3.3 mmol/L — ABNORMAL LOW (ref 3.5–5.1)
Sodium: 140 mmol/L (ref 135–145)

## 2021-06-16 LAB — LIPASE, BLOOD: Lipase: 27 U/L (ref 11–51)

## 2021-06-16 MED ORDER — POTASSIUM CHLORIDE 10 MEQ/100ML IV SOLN
10.0000 meq | Freq: Once | INTRAVENOUS | Status: AC
  Administered 2021-06-16: 10 meq via INTRAVENOUS

## 2021-06-16 MED ORDER — ONDANSETRON HCL 4 MG/2ML IJ SOLN
4.0000 mg | Freq: Four times a day (QID) | INTRAMUSCULAR | Status: DC | PRN
  Administered 2021-06-16 – 2021-06-17 (×3): 4 mg via INTRAVENOUS
  Filled 2021-06-16 (×3): qty 2

## 2021-06-16 MED ORDER — ACETAMINOPHEN 650 MG RE SUPP: 650.0000 mg | Freq: Four times a day (QID) | RECTAL | Status: DC | PRN

## 2021-06-16 MED ORDER — GADOBUTROL 1 MMOL/ML IV SOLN
7.0000 mL | Freq: Once | INTRAVENOUS | Status: AC | PRN
  Administered 2021-06-16: 7 mL via INTRAVENOUS

## 2021-06-16 MED ORDER — FENTANYL CITRATE PF 50 MCG/ML IJ SOSY
25.0000 ug | PREFILLED_SYRINGE | Freq: Once | INTRAMUSCULAR | Status: AC
  Administered 2021-06-16: 25 ug via INTRAVENOUS
  Filled 2021-06-16: qty 1

## 2021-06-16 MED ORDER — POTASSIUM CHLORIDE 10 MEQ/100ML IV SOLN
INTRAVENOUS | Status: AC
  Administered 2021-06-16: 10 meq via INTRAVENOUS
  Filled 2021-06-16: qty 100

## 2021-06-16 MED ORDER — POTASSIUM CHLORIDE CRYS ER 20 MEQ PO TBCR
40.0000 meq | EXTENDED_RELEASE_TABLET | Freq: Once | ORAL | Status: AC
Start: 2021-06-16 — End: 2021-06-16
  Administered 2021-06-16: 40 meq via ORAL
  Filled 2021-06-16: qty 2

## 2021-06-16 MED ORDER — POTASSIUM CHLORIDE 10 MEQ/100ML IV SOLN
10.0000 meq | Freq: Once | INTRAVENOUS | Status: AC
Start: 2021-06-16 — End: 2021-06-16
  Administered 2021-06-16: 10 meq via INTRAVENOUS

## 2021-06-16 MED ORDER — ENOXAPARIN SODIUM 40 MG/0.4ML IJ SOSY
40.0000 mg | PREFILLED_SYRINGE | INTRAMUSCULAR | Status: DC
  Administered 2021-06-16 – 2021-06-23 (×8): 40 mg via SUBCUTANEOUS
  Filled 2021-06-16 (×8): qty 0.4

## 2021-06-16 MED ORDER — ACETAMINOPHEN 325 MG PO TABS: 650.0000 mg | ORAL_TABLET | Freq: Four times a day (QID) | ORAL | Status: DC | PRN

## 2021-06-16 MED ORDER — SODIUM CHLORIDE 0.9 % IV SOLN
500.0000 mg | Freq: Once | INTRAVENOUS | Status: AC
  Administered 2021-06-16: 500 mg via INTRAVENOUS
  Filled 2021-06-16: qty 8

## 2021-06-16 MED ORDER — CARVEDILOL 3.125 MG PO TABS
3.1250 mg | ORAL_TABLET | Freq: Two times a day (BID) | ORAL | Status: DC
  Administered 2021-06-16 – 2021-06-19 (×7): 3.125 mg via ORAL
  Filled 2021-06-16 (×7): qty 1

## 2021-06-16 MED ORDER — SODIUM CHLORIDE 0.9 % IV BOLUS
1000.0000 mL | Freq: Once | INTRAVENOUS | Status: AC
  Administered 2021-06-16: 1000 mL via INTRAVENOUS

## 2021-06-16 MED ORDER — ACETAMINOPHEN 325 MG PO TABS
650.0000 mg | ORAL_TABLET | Freq: Four times a day (QID) | ORAL | Status: DC | PRN
  Administered 2021-06-18 – 2021-06-20 (×2): 650 mg via ORAL
  Filled 2021-06-16 (×2): qty 2

## 2021-06-16 MED ORDER — MORPHINE SULFATE (PF) 2 MG/ML IV SOLN
2.0000 mg | INTRAVENOUS | Status: AC | PRN
  Administered 2021-06-16: 2 mg via INTRAVENOUS
  Filled 2021-06-16: qty 1

## 2021-06-16 MED ORDER — KETOROLAC TROMETHAMINE 15 MG/ML IJ SOLN: 15.0000 mg | Freq: Once | INTRAMUSCULAR | Status: DC

## 2021-06-16 MED ORDER — ONDANSETRON HCL 4 MG PO TABS
4.0000 mg | ORAL_TABLET | Freq: Four times a day (QID) | ORAL | Status: DC | PRN
  Administered 2021-06-17 – 2021-06-18 (×2): 4 mg via ORAL
  Filled 2021-06-16 (×2): qty 1

## 2021-06-16 MED ORDER — POTASSIUM CHLORIDE 10 MEQ/100ML IV SOLN
10.0000 meq | Freq: Once | INTRAVENOUS | Status: AC
  Administered 2021-06-16: 10 meq via INTRAVENOUS
  Filled 2021-06-16: qty 100

## 2021-06-16 MED ORDER — SODIUM CHLORIDE 0.9 % IV SOLN: INTRAVENOUS | Status: DC

## 2021-06-16 MED ORDER — TRAMADOL HCL 50 MG PO TABS
50.0000 mg | ORAL_TABLET | Freq: Four times a day (QID) | ORAL | Status: DC | PRN
  Administered 2021-06-18: 50 mg via ORAL
  Filled 2021-06-16 (×2): qty 1

## 2021-06-16 MED ORDER — POTASSIUM CHLORIDE 10 MEQ/100ML IV SOLN
10.0000 meq | Freq: Once | INTRAVENOUS | Status: AC
  Filled 2021-06-16: qty 100

## 2021-06-16 MED ORDER — PROCHLORPERAZINE EDISYLATE 10 MG/2ML IJ SOLN
10.0000 mg | Freq: Four times a day (QID) | INTRAMUSCULAR | Status: DC | PRN
  Administered 2021-06-16: 10 mg via INTRAVENOUS
  Filled 2021-06-16 (×2): qty 2

## 2021-06-16 MED ORDER — MORPHINE SULFATE (PF) 2 MG/ML IV SOLN
2.0000 mg | INTRAVENOUS | Status: DC | PRN
  Administered 2021-06-16: 2 mg via INTRAVENOUS
  Filled 2021-06-16: qty 1

## 2021-06-16 NOTE — H&P (Addendum)
?History and Physical  ? ? ?Patient: April Larsen:[id number] DOB: 1945-07-15 ?DOA: 06/15/2021 ?DOS: the patient was seen and examined on 06/16/2021 ?PCP: Rusty Aus, MD  ?Patient coming from: Home ? ?Chief Complaint:  ?Chief Complaint  ?Patient presents with  ? Nausea  ? ? ?HPI: April Larsen is a 76 y.o. female with medical history significant for HTN, ulcerative colitis, who is s/p spinal surgery in December 2022 for severe acquired scoliosis, who presents to the ED for persistent vomiting and dizziness and headache following a fall the day prior.  Patient fell backwards down the stairs during the day on 3/18 and was seen in the ED that night and was discharged earlier in the day on 3/19 after declining a recommendation for admission.  She returned later that evening due to protracted symptoms, intractable vomiting and unable to hold down any food. Also has headache and pain in her jaw when chewing. According to the daughter at the bedside, symptoms appear to be worsening.  Daughter states that her mother can only lie flat and is unable to tolerate changes in position due to worsening of nausea.  She has been unable to perform any of her ADLs at home due to intractable dizziness and nausea.She feels better with her eyes closed. ?ED course: BP 143/80 and pulse 109 with otherwise normal vitals.  Blood work significant for potassium of 2.7, otherwise unremarkable.  EKG showed sinus tachycardia at 110 with nonspecific ST-T wave changes.  Repeat CT head with no acute findings.  X-ray of thoracolumbar spine showed no evidence of complication of spine fixation hardware.  Patient treated with fentanyl, Zofran and given an IV fluid bolus and IV potassium repletion.  Hospitalist consulted for admission. ?  ? ?Review of Systems: As mentioned in the history of present illness. All other systems reviewed and are negative. ?Past Medical History:  ?Diagnosis Date  ? Actinic keratosis   ? Anemia   ? LAST HGB 15.2 ON  04-22-16  ? Anxiety   ? Arthritis   ? Cancer [facility]) 2016  ? skin  ? Cataract (lens) fragments in eye following cataract surgery, bilateral 2013  ? one eye done then 4 weeks later the other eye done.  ? Chronic ulcerative colitis, without complications ([place]) 06/28/2876  ? Diverticulitis   ? Esophagitis, reflux 10/04/2014  ? Family history of adverse reaction to anesthesia   ? SISTER HARD TO WAKE UP and nausea and vomiting.  ? Female stress incontinence 08/09/2013  ? Gastroesophageal reflux disease without esophagitis 08/06/2016  ? GERD (gastroesophageal reflux disease)   ? Headache   ? MIGRAINE  ? History of hiatal hernia   ? Incomplete emptying of bladder 10/29/2013  ? Migraine 07/26/2013  ? PONV (postoperative nausea and vomiting)   ? nausea, does not remember vomiting.  ? Postherpetic neuralgia   ? RIGHT 7TH.CRANIAL NERVE from shingles followed by Bell's palsy  ? Postmenopausal 11/09/2016  ? PVC (premature ventricular contraction)   ? Scoliosis   ? Scoliosis (and kyphoscoliosis), idiopathic 07/26/2013  ? Scoliosis of lumbosacral spine 01/18/2015  ? Squamous cell carcinoma of skin 03/13/2013  ? L clavicle   ? Squamous cell carcinoma of skin 03/10/2018  ? L med knee  ? Squamous cell carcinoma of skin 02/21/2019  ? R cheek   ? Squamous cell carcinoma of skin 06/01/2019  ? L mid lat pretibial - ED&C  ? Trochanteric bursitis 11/29/2014  ? Ulcerative colitis ([place])   ? Urethral prolapse 08/09/2013  ?  Urge incontinence 08/09/2013  ? ?Past Surgical History:  ?Procedure Laterality Date  ? ABDOMINAL HYSTERECTOMY    ? BACK SURGERY    ? L3-L4 AND L3-S1 DECOMPRESSION  ? CARPAL TUNNEL RELEASE    ? CHOLECYSTECTOMY N/A 08/21/2016  ? Procedure: LAPAROSCOPIC CHOLECYSTECTOMY WITH INTRAOPERATIVE CHOLANGIOGRAM;  Surgeon: Leonie Green, MD;  Location: [facility] ORS;  Service: General;  Laterality: N/A;  ? COLON SURGERY    ? COLONOSCOPY  11/06/2005  ? COLONOSCOPY WITH PROPOFOL N/A 07/29/2016  ? Procedure: COLONOSCOPY WITH PROPOFOL;  Surgeon: Manya Silvas, MD;  Location: [facility] ENDOSCOPY;  Service: Endoscopy;  Laterality: N/A;  ? COLONOSCOPY WITH PROPOFOL N/A 08/22/2020  ? Procedure: COLONOSCOPY WITH PROPOFOL;  Surgeon: Virgel Manifold, MD;  Location: [facility] ENDOSCOPY;  Service: Endoscopy;  Laterality: N/A;  ? ESOPHAGOGASTRODUODENOSCOPY  11/06/2005  ? ESOPHAGOGASTRODUODENOSCOPY (EGD) WITH PROPOFOL N/A 07/29/2016  ? Procedure: ESOPHAGOGASTRODUODENOSCOPY (EGD) WITH PROPOFOL;  Surgeon: Manya Silvas, MD;  Location: [facility] ENDOSCOPY;  Service: Endoscopy;  Laterality: N/A;  ? ESOPHAGOGASTRODUODENOSCOPY (EGD) WITH PROPOFOL N/A 02/07/2020  ? Procedure: ESOPHAGOGASTRODUODENOSCOPY (EGD) WITH PROPOFOL;  Surgeon: Virgel Manifold, MD;  Location: [facility] ENDOSCOPY;  Service: Endoscopy;  Laterality: N/A;  ? LAPAROSCOPIC LOW ANTERIOR RESECTION  11/24/2016  ? Procedure: LAPAROSCOPIC LOW ANTERIOR RESECTION;  Surgeon: Jules Husbands, MD;  Location: [facility] ORS;  Service: General;;  ? XI ROBOTIC ASSISTED PARAESOPHAGEAL HERNIA REPAIR N/A 02/27/2020  ? Procedure: XI ROBOTIC ASSISTED PARAESOPHAGEAL HERNIA REPAIR;  Surgeon: Jules Husbands, MD;  Location: [facility] ORS;  Service: General;  Laterality: N/A;  ? ?Social History:  reports that she has never smoked. She has never used smokeless tobacco. She reports that she does not drink alcohol and does not use drugs. ? ?Allergies  ?Allergen Reactions  ? Nsaids Other (See Comments)  ?  Patient has had part of her colon removed   ? Sulfa Antibiotics Rash  ? ? ?Family History  ?Problem Relation Age of Onset  ? Breast cancer Sister 15  ? Breast cancer Paternal Aunt 85  ? Breast cancer Cousin   ?     1st paternal cousin  ? Bladder Cancer Neg Hx   ? Prostate cancer Neg Hx   ? Kidney cancer Neg Hx   ? ? ?Prior to Admission medications   ?Medication Sig Start Date End Date Taking? Authorizing Provider  ?acetaminophen (TYLENOL) 500 MG tablet Take 1,000 mg by mouth every 6 (six) hours as needed for moderate pain or headache.     [provider]  ?B Complex-C (B-COMPLEX WITH VITAMIN C) tablet Take 1 tablet by mouth daily.    [provider]  ?buPROPion (WELLBUTRIN XL) 150 MG 24 hr tablet Take 150 mg by mouth daily. 12/08/18 10/01/20  [provider]  ?Calcium Carbonate-Vitamin D (CALCIUM 600+D PO) Take 1 tablet by mouth 2 (two) times daily.    [provider]  ?Cholecalciferol (VITAMIN D3) 50 MCG (2000 UT) TABS Take 2,000 Units by mouth daily.    [provider]  ?colestipol (COLESTID) 1 g tablet Take 3 tablets (3 g total) by mouth 2 (two) times daily. 12/18/20 03/18/21  Virgel Manifold, MD  ?DULoxetine (CYMBALTA) 60 MG capsule Take 60 mg by mouth at bedtime.  06/17/15   [provider]  ?furosemide (LASIX) 20 MG tablet Take 1 tablet by mouth daily as needed. 08/02/20 08/02/21  [provider]  ?HYDROcodone-acetaminophen (NORCO) 5-325 MG tablet Take 1 tablet by mouth every 8 (eight) hours as needed for up  to 5 days for severe pain. 06/15/21 06/20/21  Lucrezia Starch, MD  ?mesalamine (APRISO) 0.375 g 24 hr capsule Take 4 capsules (1.5 g total) by mouth daily. 10/17/20 01/15/21  Virgel Manifold, MD  ?ondansetron (ZOFRAN-ODT) 4 MG disintegrating tablet Take 1 tablet (4 mg total) by mouth every 8 (eight) hours as needed for up to 3 days for nausea or vomiting. 06/15/21 06/18/21  Lucrezia Starch, MD  ?pantoprazole (PROTONIX) 40 MG tablet Take 40 mg by mouth daily.    [provider]  ?triamterene-hydrochlorothiazide (DYAZIDE) 37.5-25 MG capsule Take 1 capsule by mouth every morning. 09/04/20   [provider]  ? ? ?Physical Exam: ?Vitals:  ? 06/16/21 0130 06/16/21 0200 06/16/21 0230 06/16/21 0302  ?BP: (!) 142/76 133/76 126/74 140/73  ?Pulse: (!) 109 (!) 108 (!) 111 (!) 107  ?Resp: 19 (!) 22 (!) 22 (!) 21  ?Temp:      ?TempSrc:      ?SpO2: 94% 99% 97% 99%  ? ?Physical Exam ?Constitutional:   ?   Comments: Wearing an eyemask.Feels dizzy on looking around  ?HENT:  ?   Head:  ?   Comments: Small  swelling occipital scalp ?Eyes:  ?   General: Gaze aligned appropriately.  ?   Extraocular Movements:  ?   Right eye: Normal extraocular motion and no nystagmus.  ?   Left eye: Normal extraocular motion and n

## 2021-06-16 NOTE — Assessment & Plan Note (Signed)
Related to prior severe scoliosis ?

## 2021-06-16 NOTE — ED Notes (Signed)
Patient reports nausea and pain in the back of her neck. Rates an 8/10 presently. States it feels like an "achy" feeling. Will notify admitting MD for further orders. ?

## 2021-06-16 NOTE — Assessment & Plan Note (Addendum)
Patient with intractable  vertigo, unable to tolerate changes in position,following a fall less than 48 hours ago. ?CT head x2 within the past 24 hours showed no acute abnormality ?MRI cervical spine does not show any acute pathology ?She could have BPPV.  Neurology following.  PT, OT eval  ? ? ?

## 2021-06-16 NOTE — Progress Notes (Signed)
Admission profile updated. ?

## 2021-06-16 NOTE — ED Notes (Signed)
Pt return from MRI

## 2021-06-16 NOTE — ED Notes (Signed)
Report received from Brandon, RN ?

## 2021-06-16 NOTE — Assessment & Plan Note (Addendum)
X-rays thoracolumbar spine showing intact hardware.  ?S/p surgery December 2022 (See pic below of pre and postop x-rays offered by daughter ) ?At baseline ambulates with cane ?PT, OT eval while here ?Continue with outpatient PT, OT at discharge ?

## 2021-06-16 NOTE — Assessment & Plan Note (Signed)
Complains of headache and vomiting and dizziness with changes of position ?Neurology consult requested ?Follow-up MRI ?

## 2021-06-16 NOTE — Evaluation (Signed)
Physical Therapy Evaluation ?Patient Details ?Name: April Larsen ?MRN: [id number] ?DOB: 05-Mar-1946 ?Today's Date: 06/16/2021 ? ?History of Present Illness ? Pt is a 76 y.o. female who presents to the ED after suffering a fall on 3/18. Pt reports she was moving too quickly while using her cane and lost her footnig and hit the back of her head. All imaging negative; currently admitted due to vertigo w/ recurrent nausea/vomiting. PmHx: Anxiety, Arthritis, Skin Cancer, Anemia, Migraine Headaches, Chronic Urinary Incontinence. ?  ?Clinical Impression ? Pt was resting and talking on phone w/ daughter at bedside upon PT entrance into room for evaluation today. Pt is A&Ox4 and c/o headache at rest. Prior to hospitalization pt was independent w/ all ADLs and living in 1-story home w/ son. She reports she alternates between [facility] and RW at baseline at home; primarily due to s/p spinal fusion.  ? ?Pt was able to complete bed mobility to sit EOB w/ SUPERVISION. Once seated EOB pt c/o increased dizziness and nausea, and impulsively laid back down due to increase in symptoms. Pt reported dizziness and nausea decreased a little bit upon laying back down, but it was not significant. Pt will benefit from continued skilled PT in order to improve mobility/gait/balance, decrease c/o dizziness, and restore PLOF. Current discharge recommendation is to Outpatient PT, Pt reported she is currently going to Outpatient PT s/p thoracic spinal fusion. ?   ? ?Recommendations for follow up therapy are one component of a multi-disciplinary discharge planning process, led by the attending physician.  Recommendations may be updated based on patient status, additional functional criteria and insurance authorization. ? ?Follow Up Recommendations Outpatient PT ? ?  ?Assistance Recommended at Discharge Intermittent Supervision/Assistance  ?Patient can return home with the following ? A little help with walking and/or transfers;A little help with  bathing/dressing/bathroom;Assist for transportation;Help with stairs or ramp for entrance;Assistance with cooking/housework ? ?  ?Equipment Recommendations    ?Recommendations for Other Services ?    ?  ?Functional Status Assessment Patient has had a recent decline in their functional status and demonstrates the ability to make significant improvements in function in a reasonable and predictable amount of time.  ? ?  ?Precautions / Restrictions Precautions ?Precautions: Fall ?Restrictions ?Weight Bearing Restrictions: No  ? ?  ? ?Mobility ? Bed Mobility ?Overal bed mobility: Needs Assistance ?Bed Mobility: Supine to Sit ?  ?  ?Supine to sit: Supervision ?  ?  ?General bed mobility comments: further mobility/transfer deferred due to increase c/o dizziness/nausea/vomiting ?  ? ?Transfers ?  ?  ?  ?  ?  ?  ?  ?  ?  ?  ?  ? ?Ambulation/Gait ?  ?  ?  ?  ?  ?  ?  ?  ? ?Stairs ?  ?  ?  ?  ?  ? ?Wheelchair Mobility ?  ? ?Modified Rankin (Stroke Patients Only) ?  ? ?  ? ?Balance Overall balance assessment: Needs assistance ?Sitting-balance support: Feet supported, Bilateral upper extremity supported ?Sitting balance-Leahy Scale: Fair ?  ?  ?  ?  ?  ?  ?  ?  ?  ?  ?  ?  ?  ?  ?  ?  ?   ? ? ? ?Pertinent Vitals/Pain Pain Assessment ?Pain Assessment: Faces ?Pain Location: head ?Pain Descriptors / Indicators: Constant, Headache, Grimacing ?Pain Intervention(s): Limited activity within patient's tolerance, Monitored during session  ? ? ?Home Living Family/patient expects to be discharged to:: Private residence ?Living Arrangements:  Children ?Available Help at Discharge: Family ?Type of Home: House ?Home Access: Level entry ?  ?  ?  ?Home Layout: One level ?Home Equipment: [place] (2 wheels);BSC/3in1;Transport chair ?   ?  ?Prior Function Prior Level of Function : Independent/Modified Independent ?  ?  ?  ?  ?  ?  ?  ?  ?  ? ? ?Hand Dominance  ?   ? ?  ?Extremity/Trunk Assessment  ? Upper Extremity  Assessment ?Upper Extremity Assessment: Overall WFL for tasks assessed ?  ? ?Lower Extremity Assessment ?Lower Extremity Assessment: Overall WFL for tasks assessed ?  ? ?   ?Communication  ?    ?Cognition Arousal/Alertness: Awake/alert ?Behavior During Therapy: [facility] for tasks assessed/performed ?Overall Cognitive Status: Within Functional Limits for tasks assessed ?  ?  ?  ?  ?  ?  ?  ?  ?  ?  ?  ?  ?  ?  ?  ?  ?  ?  ?  ? ?  ?General Comments   ? ?  ?Exercises    ? ?Assessment/Plan  ?  ?PT Assessment Patient needs continued PT services  ?PT Problem List Decreased strength;Decreased mobility;Decreased range of motion;Decreased coordination;Decreased activity tolerance;Decreased balance;Decreased knowledge of use of DME;Pain ? ?   ?  ?PT Treatment Interventions DME instruction;Therapeutic exercise;Gait training;Balance training;Stair training;Neuromuscular re-education;Functional mobility training;Therapeutic activities;Patient/family education   ? ?PT Goals (Current goals can be found in the Care Plan section)  ?Acute Rehab PT Goals ?Patient Stated Goal: to go home ?PT Goal Formulation: With patient ?Time For Goal Achievement: 06/30/21 ?Potential to Achieve Goals: Good ? ?  ?Frequency Min 2X/week ?  ? ? ?Co-evaluation   ?  ?  ?  ?  ? ? ?  ?AM-PAC PT "6 Clicks" Mobility  ?Outcome Measure Help needed turning from your back to your side while in a flat bed without using bedrails?: A Little ?Help needed moving from lying on your back to sitting on the side of a flat bed without using bedrails?: A Little ?Help needed moving to and from a bed to a chair (including a wheelchair)?: A Little ?Help needed standing up from a chair using your arms (e.g., wheelchair or bedside chair)?: A Little ?Help needed to walk in hospital room?: A Lot ?Help needed climbing 3-5 steps with a railing? : A Lot ?6 Click Score: 16 ? ?  ?End of Session   ?Activity Tolerance: Patient tolerated treatment well;Other (comment) (Patient limited by  increased dizziness/nausea/vomit) ?Patient left: in bed;with call bell/phone within reach;with bed alarm set;with family/visitor present ?Nurse Communication: Mobility status ?PT Visit Diagnosis: History of falling (Z91.81);Muscle weakness (generalized) (M62.81) ?  ? ?Time: 8177-1165 ?PT Time Calculation (min) (ACUTE ONLY): 21 min ? ? ?Charges:     ?  ?  ?   ? ?Jonnie Kind, SPT ?06/16/2021, 3:49 PM ? ?

## 2021-06-16 NOTE — ED Notes (Signed)
Pt to MRI

## 2021-06-16 NOTE — Assessment & Plan Note (Addendum)
Continue Coreg ?

## 2021-06-16 NOTE — Assessment & Plan Note (Addendum)
[place] backwards down steps hitting occipital scalp, second visit to ED in 24 hours ?At baseline, ambulates with cane following spinal surgery for idiopathic scoliosis in December 2022.   ?PT, OT eval ?

## 2021-06-16 NOTE — ED Notes (Addendum)
Pt return from CT.

## 2021-06-16 NOTE — ED Notes (Signed)
Changed pt's brief and placed purewick. Chuck pads placed under patient as well ?

## 2021-06-16 NOTE — Final Progress Note (Signed)
Same day rounding progress ? ?Patient seen and examined in the ED.  Waiting for the floor bed.  Please see Dr. Josefine Class dictated history and physical for further details.  I agree with her assessment and plan ? ?Post concussion symptoms ?Neurology consult.  Input appreciated.  Getting C-spine MRI to rule out acute compression/hematoma and started on Compazine and Solu-Medrol IV. ? ?Time spent: 20 minutes ?

## 2021-06-16 NOTE — Assessment & Plan Note (Signed)
No acute issues.

## 2021-06-16 NOTE — Consult Note (Addendum)
Neurology Consultation ? ?Reason for Consult: Persistent headache after fall and head injury ?Referring Physician: Dr. Carlynn Spry ? ?CC: Persistent headache after fall and head injury ? ?History is obtained from: Patient, chart ? ?HPI: April Larsen is a 76 y.o. female past medical history of migraine headaches, ulcerative colitis, hypertension, severe scoliosis status post spinal surgery in December 2022 in [place], came into the emergency room for evaluation after she had a fall on 06/14/2020.  Head CT with no acute changes.  She did have a scalp hematoma posteriorly.  CT of the C-spine with no acute fracture but multifocal degenerative disc disease.  She continues to have headaches even after being discharged to the point where she was unable to do her ADLs and this brought her back to the emergency room. ?The patient reports that she lost footing while getting up and fell.  She hit her head on the floor and had a big goose egg on the back of her head and started getting headache and neck pain after that.  She also developed terrible nausea and has been throwing up and is unable to keep any food down. ?She was worried about her back since she had the surgery in December and it was a complex scoliosis surgery.  She has had no worsening in her back pain.  She has not walked much since the admission. ?When I got into her room this morning, she had a sleeping mask on her eyes with lights turned down.  She complained of photophobia and phonophobia.  She described the headache as a severe pressure type pain all across her head more so on the back where she hit her head.  Continues to report nausea at this time. ?Patient was given fentanyl, Zofran and IV fluids along with replating IV potassium and was admitted because of persistent and intractable headache after the fall.  Neurological consultation on medical management. ?She had an MRI of the brain done which did not show any acute changes. ?She continues to complain  of a headache as well as neck pain which is much worse than her baseline neck pain-has known cervical spine arthritis and degenerative disc disease for which she has been recommended surgery but this pain appears to be much worse than her baseline going from the back of her head to the neck and also involving her arms. ? ? ?ROS: Full ROS was performed and is negative except as noted in the HPI.  ? ?Past Medical History:  ?Diagnosis Date  ? Actinic keratosis   ? Anemia   ? LAST HGB 15.2 ON 04-22-16  ? Anxiety   ? Arthritis   ? Cancer [facility]) 2016  ? skin  ? Cataract (lens) fragments in eye following cataract surgery, bilateral 2013  ? one eye done then 4 weeks later the other eye done.  ? Chronic ulcerative colitis, without complications ([place]) 12/01/7652  ? Diverticulitis   ? Esophagitis, reflux 10/04/2014  ? Family history of adverse reaction to anesthesia   ? SISTER HARD TO WAKE UP and nausea and vomiting.  ? Female stress incontinence 08/09/2013  ? Gastroesophageal reflux disease without esophagitis 08/06/2016  ? GERD (gastroesophageal reflux disease)   ? Headache   ? MIGRAINE  ? History of hiatal hernia   ? Incomplete emptying of bladder 10/29/2013  ? Migraine 07/26/2013  ? PONV (postoperative nausea and vomiting)   ? nausea, does not remember vomiting.  ? Postherpetic neuralgia   ? RIGHT 7TH.CRANIAL NERVE from shingles followed by  Bell's palsy  ? Postmenopausal 11/09/2016  ? PVC (premature ventricular contraction)   ? Scoliosis   ? Scoliosis (and kyphoscoliosis), idiopathic 07/26/2013  ? Scoliosis of lumbosacral spine 01/18/2015  ? Squamous cell carcinoma of skin 03/13/2013  ? L clavicle   ? Squamous cell carcinoma of skin 03/10/2018  ? L med knee  ? Squamous cell carcinoma of skin 02/21/2019  ? R cheek   ? Squamous cell carcinoma of skin 06/01/2019  ? L mid lat pretibial - ED&C  ? Trochanteric bursitis 11/29/2014  ? Ulcerative colitis ([place])   ? Urethral prolapse 08/09/2013  ? Urge incontinence 08/09/2013  ? ?Family History   ?Problem Relation Age of Onset  ? Breast cancer Sister 88  ? Breast cancer Paternal Aunt 39  ? Breast cancer Cousin   ?     1st paternal cousin  ? Bladder Cancer Neg Hx   ? Prostate cancer Neg Hx   ? Kidney cancer Neg Hx   ? ?Social History:  ? reports that she has never smoked. She has never used smokeless tobacco. She reports that she does not drink alcohol and does not use drugs. ? ?Medications ? ?Current Facility-Administered Medications:  ?  0.9 %  sodium chloride infusion, , Intravenous, Continuous, Judd Gaudier V, MD, Last Rate: 75 mL/hr at 06/16/21 0715, New Bag at 06/16/21 0715 ?  acetaminophen (TYLENOL) tablet 650 mg, 650 mg, Oral, Q6H PRN **OR** acetaminophen (TYLENOL) suppository 650 mg, 650 mg, Rectal, Q6H PRN, Athena Masse, MD ?  carvedilol (COREG) tablet 3.125 mg, 3.125 mg, Oral, BID WC, Max Sane, MD, 3.125 mg at 06/16/21 0933 ?  enoxaparin (LOVENOX) injection 40 mg, 40 mg, Subcutaneous, Q24H, Athena Masse, MD, 40 mg at 06/16/21 0944 ?  ondansetron (ZOFRAN) tablet 4 mg, 4 mg, Oral, Q6H PRN **OR** ondansetron (ZOFRAN) injection 4 mg, 4 mg, Intravenous, Q6H PRN, Athena Masse, MD, 4 mg at 06/16/21 5784 ?  prochlorperazine (COMPAZINE) injection 10 mg, 10 mg, Intravenous, Q6H PRN, Athena Masse, MD ?  traMADol Veatrice Bourbon) tablet 50 mg, 50 mg, Oral, Q6H PRN, Athena Masse, MD ? ?Exam: ?Current vital signs: ?BP (!) 142/70 (BP Location: Right Arm) Comment: Chris notified  Pulse (!) 107 Comment: Chris notified  Temp 98.2 ?F (36.8 ?C) (Oral)   Resp 18   SpO2 96%  ?Vital signs in last 24 hours: ?Temp:  [98 ?F (36.7 ?C)-98.2 ?F (36.8 ?C)] 98.2 ?F (36.8 ?C) (03/20 0847) ?Pulse Rate:  [107-132] 107 (03/20 0847) ?Resp:  [13-22] 18 (03/20 0847) ?BP: (126-151)/(70-93) 142/70 (03/20 0847) ?SpO2:  [92 %-100 %] 96 % (03/20 0847) ?General: Awake alert somewhat in distress due to the headache ?HEENT: Redness and swelling on the scalp in the occipital region in the midline. ?CVS: Regular  rhythm ?Respiratory: Breathing well saturating normally on room air ?Abdomen nondistended nontender ?Extremities well perfused ?Neurologic exam ?Awake alert oriented x3 ?No dysarthria ?No evidence of aphasia ?Cranial nerves II to XII intact.  There was end gaze nystagmus bilaterally which lasted for about 3-4 beats with no diplopia. ?Motor examination with antigravity strength in all 4 extremities with symmetric weakness 4+/5 in bilateral lower extremities. ?Sensory exam intact to light touch without extinction ?DTRs: 2+ brachioradialis bilaterally, 2+ biceps bilaterally.  3+ ankle jerks and ankle clonus which is sustained on the left and about 4-5 beats on the right. ?Toes are upgoing on the left and possibly on the right as well-difficult to examine on the right as she has a lot  of withdrawal ? ? ?Labs ?I have reviewed labs in epic and the results pertinent to this consultation are: ? ?CBC ?   ?Component Value Date/Time  ? WBC 7.8 06/15/2021 2154  ? RBC 4.36 06/15/2021 2154  ? HGB 12.3 06/15/2021 2154  ? HGB 15.5 12/03/2020 1150  ? HCT 37.4 06/15/2021 2154  ? HCT 45.2 12/03/2020 1150  ? PLT 435 (H) 06/15/2021 2154  ? PLT 412 12/03/2020 1150  ? MCV 85.8 06/15/2021 2154  ? MCV 93 12/03/2020 1150  ? MCV 88 05/18/2013 0741  ? MCH 28.2 06/15/2021 2154  ? MCHC 32.9 06/15/2021 2154  ? RDW 14.4 06/15/2021 2154  ? RDW 12.5 12/03/2020 1150  ? RDW 14.0 05/18/2013 0741  ? LYMPHSABS 0.7 06/15/2021 2154  ? LYMPHSABS 2.0 05/18/2013 0741  ? MONOABS 0.9 06/15/2021 2154  ? MONOABS 1.2 (H) 05/18/2013 0741  ? EOSABS 0.1 06/15/2021 2154  ? EOSABS 0.1 05/18/2013 0741  ? BASOSABS 0.1 06/15/2021 2154  ? BASOSABS 0.1 05/18/2013 0741  ? ? ?CMP  ?   ?Component Value Date/Time  ? NA 140 06/15/2021 2154  ? NA 135 12/03/2020 1150  ? NA 140 03/29/2012 1120  ? K 2.7 (LL) 06/15/2021 2154  ? K 3.7 03/29/2012 1120  ? CL 105 06/15/2021 2154  ? CL 110 (H) 03/29/2012 1120  ? CO2 25 06/15/2021 2154  ? CO2 20 (L) 03/29/2012 1120  ? GLUCOSE 123 (H)  06/15/2021 2154  ? GLUCOSE 70 03/29/2012 1120  ? BUN 24 (H) 06/15/2021 2154  ? BUN 22 12/03/2020 1150  ? BUN 17 03/29/2012 1120  ? CREATININE 0.68 06/15/2021 2154  ? CREATININE 0.74 03/29/2012 1120  ? CALCIUM 9.6 06/15/2021 2154  ?

## 2021-06-17 DIAGNOSIS — W108XXD Fall (on) (from) other stairs and steps, subsequent encounter: Secondary | ICD-10-CM

## 2021-06-17 DIAGNOSIS — F0781 Postconcussional syndrome: Secondary | ICD-10-CM | POA: Diagnosis not present

## 2021-06-17 DIAGNOSIS — R42 Dizziness and giddiness: Secondary | ICD-10-CM | POA: Diagnosis not present

## 2021-06-17 DIAGNOSIS — E876 Hypokalemia: Secondary | ICD-10-CM | POA: Diagnosis not present

## 2021-06-17 MED ORDER — DIAZEPAM 2 MG PO TABS
2.0000 mg | ORAL_TABLET | Freq: Four times a day (QID) | ORAL | Status: DC | PRN
  Administered 2021-06-17 – 2021-06-18 (×2): 2 mg via ORAL
  Filled 2021-06-17 (×2): qty 1

## 2021-06-17 MED ORDER — PROCHLORPERAZINE MALEATE 5 MG PO TABS
5.0000 mg | ORAL_TABLET | Freq: Four times a day (QID) | ORAL | Status: DC | PRN
  Administered 2021-06-17 – 2021-06-18 (×2): 5 mg via ORAL
  Filled 2021-06-17 (×5): qty 1

## 2021-06-17 MED ORDER — SODIUM CHLORIDE 0.9 % IV BOLUS
1000.0000 mL | Freq: Once | INTRAVENOUS | Status: AC
  Administered 2021-06-17: 1000 mL via INTRAVENOUS

## 2021-06-17 NOTE — Evaluation (Signed)
Occupational Therapy Evaluation Patient Details Name: April Larsen MRN: [id number] DOB: 1945/09/11 Today's Date: 06/17/2021   History of Present Illness Pt is a 76 y.o. female who presents to the ED after suffering a fall on 3/18. Pt reports she was moving too quickly while using her cane and lost her footnig and hit the back of her head. All imaging negative; currently admitted due to vertigo w/ recurrent nausea/vomiting. PmHx: Anxiety, Arthritis, Skin Cancer, Anemia, Migraine Headaches, Chronic Urinary Incontinence.   Clinical Impression   [name] was seen for OT evaluation this date. Prior to hospital admission, pt was living at home, alone, with her adult son who has special needs. Pt reports being generally independent with ADL management. She states that she had been recovering well since her back surgery in December. Pt reports being able to stand to cook, perform her BADLs independently, and working with OP PT to use a SPC for safety with functional mobility. Currently pt demonstrates impairments as described below (See OT problem list) which functionally limit her ability to perform ADL/self-care tasks. Pt currently requires MOD-MAX A to perform LB ADL management 2/2 intense dizziness with positional changes. Pt is very motivated to return to PLOF and endorses strong desire to get back to meaningful leisure occupations such as dancing and cooking. Pt seen by PT after initial OT evaluation and was found to be + for R BPPV. Pt would benefit from skilled OT services to address noted impairments and functional limitations (see below for any additional details) in order to maximize safety and independence while minimizing falls risk and caregiver burden. Upon hospital discharge, recommend acute inpatient rehab to maximize safety and return to PLOF.        Recommendations for follow up therapy are one component of a multi-disciplinary discharge planning process, led by the attending physician.   Recommendations may be updated based on patient status, additional functional criteria and insurance authorization.   Follow Up Recommendations  Acute inpatient rehab (3hours/day)    Assistance Recommended at Discharge Intermittent Supervision/Assistance  Patient can return home with the following A lot of help with bathing/dressing/bathroom;A lot of help with walking and/or transfers;Assistance with cooking/housework;Help with stairs or ramp for entrance;Assist for transportation    Functional Status Assessment  Patient has had a recent decline in their functional status and demonstrates the ability to make significant improvements in function in a reasonable and predictable amount of time.  Equipment Recommendations  None recommended by OT (Pt has necessary equipment.)    Recommendations for Other Services       Precautions / Restrictions Precautions Precautions: Fall Precaution Comments: recent spine fusion (12/22) Restrictions Weight Bearing Restrictions: No      Mobility Bed Mobility Overal bed mobility: Needs Assistance Bed Mobility: Sit to Supine, Rolling, Sidelying to Sit, Supine to Sit   Sidelying to sit: Min assist Supine to sit: Min assist, HOB elevated Sit to supine: Supervision        Transfers Overall transfer level: Needs assistance Equipment used: Rolling walker (2 wheels) Transfers: Sit to/from Stand Sit to Stand: Min guard, Supervision, From elevated surface                  Balance Overall balance assessment: Needs assistance Sitting-balance support: Feet supported, Bilateral upper extremity supported Sitting balance-Leahy Scale: Fair Sitting balance - Comments: heavy reliance of BUE support on EOB during static sitting.   Standing balance support: During functional activity, Reliant on assistive device for balance Standing balance-Leahy Scale: Poor  Standing balance comment: Limited 2/2 increased dizziness upon standing. Heavy reliance on  RW for functional mobility.                           ADL either performed or assessed with clinical judgement   ADL Overall ADL's : Needs assistance/impaired                                       General ADL Comments: Pt is functionally limited by generalized weakness, decreased activity tolerance, and increased dizziness with positional changes. She requires MIN A for sup>sit t/f but is able to progres to supervision for bed mobility. Supervision for STS at EOB but is not able to progress beyond EOB functional mobility 2/2 dizziness. Pt is unable to attempt further ADL management 2/2 8/10 dizziness while upright. Anticipate MOD-MAX A for seated LB ADL managment.     Vision Patient Visual Report: No change from baseline       Perception     Praxis      Pertinent Vitals/Pain Pain Assessment Pain Assessment: No/denies pain (c/o dizziness/spinning at rest and with mobility.)     Hand Dominance Right   Extremity/Trunk Assessment Upper Extremity Assessment Upper Extremity Assessment: Generalized weakness (no focal weakness appreciated. Grossly 3/5 t/o BUE. RUE limited by PIV this date.)   Lower Extremity Assessment Lower Extremity Assessment: Generalized weakness       Communication Communication Communication: No difficulties   Cognition Arousal/Alertness: Awake/alert Behavior During Therapy: WFL for tasks assessed/performed Overall Cognitive Status: Within Functional Limits for tasks assessed                                       General Comments       Exercises Other Exercises Other Exercises: Pt/caregiver (daughter) educated on role of OT in acute setting, falls prevention strategies, DC recs, and routines modifications to support safety and functional independence upon DC home.   Shoulder Instructions      Home Living Family/patient expects to be discharged to:: Private residence Living Arrangements: Children (Adult  son who works during the day) Available Help at Discharge: Family Type of Home: House Home Access: Level entry     Home Layout: One level     Bathroom Shower/Tub: Tub/shower unit         Home Equipment: Pharmacist, hospital (2 wheels);BSC/3in1;Transport chair;Cane - single point   Additional Comments: Son has special needs.      Prior Functioning/Environment Prior Level of Function : Independent/Modified Independent             Mobility Comments: Pt had gotten back to to being MOD I for functional mobility she reports using a SPC and being follow by OPPT at [place] ADLs Comments: Pt endorses being MOD I For ADL management. Endorses multiple falls in last 6 months.        OT Problem List: Decreased strength;Decreased coordination;Decreased range of motion;Decreased activity tolerance;Decreased safety awareness;Impaired balance (sitting and/or standing);Decreased knowledge of use of DME or AE;Impaired vision/perception      OT Treatment/Interventions: Self-care/ADL training;Therapeutic exercise;Therapeutic activities;DME and/or AE instruction;Patient/family education;Balance training;Energy conservation;Neuromuscular education    OT Goals(Current goals can be found in the care plan section) Acute Rehab OT Goals Patient Stated Goal: To get stronger so I can  go dancing OT Goal Formulation: With patient Time For Goal Achievement: 07/01/21 Potential to Achieve Goals: Good  OT Frequency: Min 4X/week    Co-evaluation              AM-PAC OT "6 Clicks" Daily Activity     Outcome Measure Help from another person eating meals?: None Help from another person taking care of personal grooming?: A Little Help from another person toileting, which includes using toliet, bedpan, or urinal?: A Lot Help from another person bathing (including washing, rinsing, drying)?: A Lot Help from another person to put on and taking off regular upper body clothing?: A Little Help from  another person to put on and taking off regular lower body clothing?: A Lot 6 Click Score: 16   End of Session Equipment Utilized During Treatment: Gait belt;Rolling walker (2 wheels)  Activity Tolerance: Other (comment) (Limited by dizziness) Patient left: in bed  OT Visit Diagnosis: Other abnormalities of gait and mobility (R26.89);BPPV;Muscle weakness (generalized) (M62.81);History of falling (Z91.81) BPPV - Right/Left: Right                Time: 1301-1340 OT Time Calculation (min): 39 min Charges:  OT General Charges $OT Visit: 1 Visit OT Evaluation $OT Eval Moderate Complexity: 1 Mod OT Treatments $Self Care/Home Management : 23-37 mins  Rockney Ghee, M.S., OTR/L [employer] - [facility] Ascom: [phone] 06/17/21, 3:29 PM

## 2021-06-17 NOTE — Progress Notes (Signed)
? ?  Inpatient Rehab Admissions Coordinator : ? ?Per therapy recommendations patient was screened for CIR candidacy by Danne Baxter RN MSN. Patient does not appear to demonstrate the medical neccesity for a [facility] /CIR admit. I will not place a Rehab Consult. Recommend other Rehab Venues to be pursued. Please contact me with any questions. ? ?Danne Baxter RN MSN ?Admissions Coordinator ?[phone]  ?

## 2021-06-17 NOTE — Assessment & Plan Note (Signed)
Appreciate neurology input and active management by them ?Switching Compazine as needed, adding low-dose diazepam.  PT, OT eval ?Consider low-dose muscle relaxant and/or IV steroids and or one-time dose of IV valproate per neurology ?Patient is much better than the one mentioned coming but still has some lingering headache, dizziness, vertigo symptoms ?

## 2021-06-17 NOTE — Hospital Course (Signed)
76 y.o. female with medical history significant for HTN, ulcerative colitis, who is s/p spinal surgery in December 2022 for severe acquired scoliosis admitted for persistent vomiting and dizziness and headache following a fall the day prior ? ?3/20 -neuro consult ?3/21: Compazine, diazepam and pain medicine/muscle relaxant per neurology.  PT, OT eval ?

## 2021-06-17 NOTE — Progress Notes (Signed)
?  Progress Note ? ? ?Patient: April Larsen XBW:[id number] DOB: [birth year] DOA: 06/15/2021     1 ?DOS: the patient was seen and examined on 06/17/2021 ?  ?Brief hospital course: ?76 y.o. female with medical history significant for HTN, ulcerative colitis, who is s/p spinal surgery in December 2022 for severe acquired scoliosis admitted for persistent vomiting and dizziness and headache following a fall the day prior ? ?3/20 -neuro consult ?3/21: Compazine, diazepam and pain medicine/muscle relaxant per neurology.  PT, OT eval ? ? ?Assessment and Plan: ?* Post concussion syndrome ?Appreciate neurology input and active management by them ?Switching Compazine as needed, adding low-dose diazepam.  PT, OT eval ?Consider low-dose muscle relaxant and/or IV steroids and or one-time dose of IV valproate per neurology ?Patient is much better than the one mentioned coming but still has some lingering headache, dizziness, vertigo symptoms ? ?Fall (on) (from) other stairs and steps 06/14/21, subsequent encounter ?[place] [address] backwards down steps hitting occipital scalp, second visit to ED in 24 hours ?At baseline, ambulates with cane following spinal surgery for idiopathic scoliosis in December 2022.   ?PT, OT eval ? ?Vertigo ?Patient with intractable  vertigo, unable to tolerate changes in position,following a fall less than 48 hours ago. ?CT head x2 within the past 24 hours showed no acute abnormality ?MRI cervical spine does not show any acute pathology ?She could have BPPV.  Neurology following.  PT, OT eval  ? ? ? ?Concussion-resolved as of 06/17/2021 ?Complains of headache and vomiting and dizziness with changes of position ?Neurology consult requested ?Follow-up MRI ? ?Spinal surgery in prior 3 months for correction of scoliosis ?X-rays thoracolumbar spine showing intact hardware.  ?S/p surgery December 2022 (See pic below of pre and postop x-rays offered by daughter ) ?At baseline ambulates with cane ?PT, OT eval while  here ?Continue with outpatient PT, OT at discharge ? ?Hypokalemia ?Replete and recheck ? ?Essential hypertension ?Continue Coreg ? ?History of repair of paraesophageal hernia ?Related to prior severe scoliosis ? ?Chronic ulcerative colitis, without complications ([name]) ?No acute issues ? ? ? ? ?  ? ?Subjective: Patient still reports ongoing dizziness/vertigo symptoms although much improved compared to admission.  Headache is somewhat better as well ? ?Physical Exam: ?Vitals:  ? 06/16/21 1649 06/16/21 1950 06/17/21 0331 06/17/21 0759  ?BP: (!) 142/78 (!) 125/59 (!) 141/85 139/74  ?Pulse: 100 (!) 106 (!) 104 (!) 108  ?Resp:  18 16 18   ?Temp:  98.1 ?F (36.7 ?C) 97.7 ?F (36.5 ?C) 97.6 ?F (36.4 ?C)  ?TempSrc:  Oral Oral   ?SpO2: 94% 97% 97% 12%  ? ?76 year old female lying in the bed comfortably without any acute distress ?Eyes pupil equal round reactive to light and accommodation ?Lungs clear to auscultation bilaterally, no wheezing rales rhonchi or crepitation ?Cardiovascular [place] normal, no murmur ?Abdomen soft, benign ?Skin no rash or lesion ?Neuro alert, oriented, Non-focal ? ? ? ?Data Reviewed: ? ?Potassium 3.3 ? ?Family Communication: Updated daughter over phone ? ?Disposition: ?Status is: Inpatient ?Remains inpatient appropriate because: Ongoing dizziness, vertigo and headache status postconcussion ? ? Planned Discharge Destination: Home with Home Health ? ? ? DVT prophylaxis-Lovenox ?Time spent: 35 minutes ? ?Author: ?Max Sane, MD ?06/17/2021 1:54 PM ? ?For on call review [email or web address].  ?

## 2021-06-17 NOTE — Progress Notes (Signed)
Neurology Progress Note ? ? ?S:// ?Seen and examined ?Reports significant improvement in headache but continuing occipital headache. ?Also complains of dizziness and vertigo upon trying to move-has not walked much or move much. ? ? ?O:// ?Current vital signs: ?BP 139/74 (BP Location: Right Arm)   Pulse (!) 108   Temp 97.6 ?F (36.4 ?C)   Resp 18   SpO2 97%  ?Vital signs in last 24 hours: ?Temp:  [97.6 ?F (36.4 ?C)-98.3 ?F (36.8 ?C)] 97.6 ?F (36.4 ?C) (03/21 0759) ?Pulse Rate:  [96-108] 108 (03/21 0759) ?Resp:  [16-18] 18 (03/21 0759) ?BP: (125-142)/(59-85) 139/74 (03/21 0759) ?SpO2:  [94 %-97 %] 97 % (03/21 0759) ?General: Much more comfortable appearing today, lights were off in the room but she did not have the eye mask on and was reading the menu comfortably. ?HEENt: Swelling on the occipital region bilaterally ?CVS: Regular rhythm ?Respiratory breathing well saturating normally on room air ?Extremity warm well perfused ?Abdomen nondistended nontender ?Neurological exam ?Awake alert oriented x3.  No dysarthria.  No aphasia.  Cranial nerves II to XII intact with questionable 2-3 beat and gaze nystagmus bilaterally.  Motor examination with symmetric 5/5 strength in the upper extremities and 4+/5 strength with some restricted movement due to pain in bilateral lower extremities.  Sensation intact to light touch without extinction.  Brisk reflexes in the lower extremities with ankle clonus which is sustained on the left and 4-5 beats on the right.  Unchanged from yesterday. ? ?Medications ? ?Current Facility-Administered Medications:  ?  0.9 %  sodium chloride infusion, , Intravenous, Continuous, Athena Masse, MD, Last Rate: 75 mL/hr at 06/17/21 0200, New Bag at 06/17/21 0200 ?  acetaminophen (TYLENOL) tablet 650 mg, 650 mg, Oral, Q6H PRN **OR** acetaminophen (TYLENOL) suppository 650 mg, 650 mg, Rectal, Q6H PRN, Foust, Katy L, NP ?  carvedilol (COREG) tablet 3.125 mg, 3.125 mg, Oral, BID WC, Manuella Ghazi, Vipul, MD,  3.125 mg at 06/16/21 1722 ?  enoxaparin (LOVENOX) injection 40 mg, 40 mg, Subcutaneous, Q24H, Judd Gaudier V, MD, 40 mg at 06/16/21 0944 ?  ondansetron (ZOFRAN) tablet 4 mg, 4 mg, Oral, Q6H PRN **OR** ondansetron (ZOFRAN) injection 4 mg, 4 mg, Intravenous, Q6H PRN, Athena Masse, MD, 4 mg at 06/16/21 1726 ?  prochlorperazine (COMPAZINE) injection 10 mg, 10 mg, Intravenous, Q6H PRN, Athena Masse, MD, 10 mg at 06/16/21 2100 ?  traMADol (ULTRAM) tablet 50 mg, 50 mg, Oral, Q6H PRN, Athena Masse, MD ?Labs ?CBC ?   ?Component Value Date/Time  ? WBC 7.8 06/15/2021 2154  ? RBC 4.36 06/15/2021 2154  ? HGB 12.3 06/15/2021 2154  ? HGB 15.5 12/03/2020 1150  ? HCT 37.4 06/15/2021 2154  ? HCT 45.2 12/03/2020 1150  ? PLT 435 (H) 06/15/2021 2154  ? PLT 412 12/03/2020 1150  ? MCV 85.8 06/15/2021 2154  ? MCV 93 12/03/2020 1150  ? MCV 88 05/18/2013 0741  ? MCH 28.2 06/15/2021 2154  ? MCHC 32.9 06/15/2021 2154  ? RDW 14.4 06/15/2021 2154  ? RDW 12.5 12/03/2020 1150  ? RDW 14.0 05/18/2013 0741  ? LYMPHSABS 0.7 06/15/2021 2154  ? LYMPHSABS 2.0 05/18/2013 0741  ? MONOABS 0.9 06/15/2021 2154  ? MONOABS 1.2 (H) 05/18/2013 0741  ? EOSABS 0.1 06/15/2021 2154  ? EOSABS 0.1 05/18/2013 0741  ? BASOSABS 0.1 06/15/2021 2154  ? BASOSABS 0.1 05/18/2013 0741  ? ? ?CMP  ?   ?Component Value Date/Time  ? NA 140 06/16/2021 2156  ? NA 135 12/03/2020  1150  ? NA 140 03/29/2012 1120  ? K 3.3 (L) 06/16/2021 2156  ? K 3.7 03/29/2012 1120  ? CL 110 06/16/2021 2156  ? CL 110 (H) 03/29/2012 1120  ? CO2 23 06/16/2021 2156  ? CO2 20 (L) 03/29/2012 1120  ? GLUCOSE 164 (H) 06/16/2021 2156  ? GLUCOSE 70 03/29/2012 1120  ? BUN 17 06/16/2021 2156  ? BUN 22 12/03/2020 1150  ? BUN 17 03/29/2012 1120  ? CREATININE 0.67 06/16/2021 2156  ? CREATININE 0.74 03/29/2012 1120  ? CALCIUM 9.0 06/16/2021 2156  ? CALCIUM 8.9 03/29/2012 1120  ? PROT 7.2 06/15/2021 2154  ? PROT 7.2 12/03/2020 1150  ? PROT 7.5 03/29/2012 1120  ? ALBUMIN 3.5 06/15/2021 2154  ? ALBUMIN 4.6  12/03/2020 1150  ? ALBUMIN 4.1 03/29/2012 1120  ? AST 25 06/15/2021 2154  ? AST 25 03/29/2012 1120  ? ALT 16 06/15/2021 2154  ? ALT 29 03/29/2012 1120  ? ALKPHOS 108 06/15/2021 2154  ? ALKPHOS 124 03/29/2012 1120  ? BILITOT 0.8 06/15/2021 2154  ? BILITOT 0.6 12/03/2020 1150  ? BILITOT 0.3 03/29/2012 1120  ? GFRNONAA >60 06/16/2021 2156  ? GFRNONAA >60 03/29/2012 1120  ? GFRAA >60 01/14/2019 0106  ? GFRAA >60 03/29/2012 1120  ? ? ?Imaging ?I have reviewed images in epic and the results pertinent to this consultation are: ?CT-head and neck 06/14/2021 ?IMPRESSION: ?No acute intracranial abnormality. No calvarial fracture. Small ?right parietal scalp hematoma. ?  ?No acute fracture or traumatic listhesis of the cervical spine. ?  ?Multilevel degenerative disc and degenerative joint disease, as ?described above, with resultant multilevel moderate to severe ?neuroforaminal narrowing and moderate central canal stenosis at ?C3-4. ?  ?CT head 06/16/2021 ?IMPRESSION: ?No evidence of acute intracranial abnormality. Mild small vessel ?ischemic changes. ?  ?No interval change from recent CT. ?  ?MRI examination of the brain 06/16/2021 ?IMPRESSION: ?1. No acute intracranial abnormality with essentially normal for age ?noncontrast MRI appearance of the brain. ?2. Extensive bilateral posterior scalp hematoma. ? ?MRI C-spine 06/16/2021 ?IMPRESSION: ?No acute or traumatic finding in the cervical region. ?Chronic degenerative spondylosis as outlined above. Mild narrowing ?of the canal from C3-4 through C6-7, but no compressive canal ?stenosis. Foraminal narrowing due to osteophytic encroachment with ?some potential for relevant foraminal stenosis on the left at C3-4, ?on the left at C4-5, bilateral at C5-6 and on the right at C6-7. No ?change is appreciated from the recent CT or MRI. ?   ?Assessment:  ?76 year old with complaints of intractable headache that started after she sustained a fall and hit her back of the head to the ground.   She has a swelling around the midline on the back of her head along with bruising.  Likely headaches from the swelling and the bruise as well as possible irritation of the occipital nerves causing occipital neuralgia as well as cervicalgia as she also complains of neck pain.  Has degenerative cervical spine-unchanged on MRI from before. ?At this time, her headache has a multifactorial etiology-postconcussive along with occipital neuralgia and possible component of cervicalgia-also has history of migraines which predisposes her to headaches. ?She is intolerant to NSAIDs due to previous colon surgery. ?IV fluids, 1 dose of Solu-Medrol, and Compazine have been helpful in reducing the headache from a 10 out of 10 to about 2-3 out of 10. ?Continues to complain of some dizziness/vertiginous symptoms. ?I suspect the myriad of her symptoms are all related to the fall and hit on the head. ? ?Recommendations: ?-  Switching Compazine to p.o.-Compazine 5 mg p.o. every 6 hours as needed for nausea/vomiting ?-Add low-dose diazepam-69m q6h prn for dizziness/vertiginous symptoms/muscle spasms ?-Given her advanced age I would hesitate to add other medications such as muscle relaxants which can sometimes help neck stiffness/cervicalgia but if need be, can consider low-dose muscle relaxants ?-I will order another bolus of normal saline-1 L over 1 hour ?-PT OT ?-Patient hesitant to try NSAIDs.  If headaches do not get under decent control with the above regimen, will consider the muscle relaxants as above and also will consider a repeat dose of IV steroid as well as one-time dose of IV valproate 500 mg. ?Plan discussed with the patient and Dr. SManuella Ghazi? ?-- ?AAmie Portland MD ?Neurologist ?[facility] ?Pager: [phone]? ? ? ?

## 2021-06-17 NOTE — Clinical Social Work Note (Addendum)
Occupational Therapy * Physical Therapy * Speech Therapy ?       ? ? ?DATE ___3/27/23________________ ?PATIENT NAME__[name]_____ ?PATIENT LEX_[id number]____ ? ?DIAGNOSIS/DIAGNOSIS CODE ___Vertigo, R42______________ ?DATE OF DISCHARGE: __3/27/23___ ? ?PRIMARY CARE PHYSICIAN ____Mark Miller_ ?PCP PHONE/FAX______[phone]________ ? ?  ? ?Dear Provider (Name: __________________   ?Fax: ___________________________): ?  ?I certify that I have examined this patient and that occupational/physical/speech therapy is necessary on an outpatient basis.   ? ?The patient has expressed interest in completing their recommended course of therapy at your location.  Once a formal order from the patient's primary care physician has been obtained, please contact him/her to schedule an appointment for evaluation at your earliest convenience. ? ? ?[ X ]  Physical Therapy Evaluate and Treat ? ?   [ X ]  Occupational Therapy Evaluate and Treat ? ?  [  ]  Speech Therapy Evaluate and Treat ? ? ?PT WILL NEED VESTIBULAR THERAPY. ? ? ? ?The patient's primary care physician (listed above) must furnish and be responsible for a formal order such that the recommended services may be furnished while under the primary physician's care, and that the plan of care will be established and reviewed every 30 days (or more often if condition necessitates).  ?

## 2021-06-17 NOTE — Progress Notes (Signed)
Physical Therapy Treatment ?Patient Details ?Name: April Larsen ?MRN: [id number] ?DOB: 07/08/1945 ?Today's Date: 06/17/2021 ? ? ?History of Present Illness Pt is a 76 y.o. female who presents to the ED after suffering a fall on 3/18. Pt reports she was moving too quickly while using her cane and lost her footnig and hit the back of her head. All imaging negative; currently admitted due to vertigo w/ recurrent nausea/vomiting. PmHx: Anxiety, Arthritis, Skin Cancer, Anemia, Migraine Headaches, Chronic Urinary Incontinence. ? ?  ?PT Comments  ? ? Patient alert, anxious throughout but motivated to return to PLOF. Pt/family educated about BPPV, and session focused on assessment. The patient demonstrated bed mobility with minA-modAx2 (for safety, due to patient symptoms) and pt very nauseous and dizzy throughout. Testing indicated pt POSITIVE for R posterior canalithiasis. Pt tolerated one Epley maneuver, no improvement in symptoms at end of session. Pt will need ongoing assessment (to assess L) as well as treatment to address these conditions. Recommendation at this time updated to CIR due to pt post concussion syndrome, BPPV, and current limitations with functional activities. Pt/family both very motivated and interested in inpatient rehab as able.  ? ? ?BPPV TESTS: ? Symptoms Duration Intensity Nystagmus  ?Left Dix-Hallpike Not tested     ?Right Dix-Hallpike Spinning, nausea 1-2 minutes Pt did not rate, reported very intense yes  ?Left Head Roll negative     ?Right Head Roll negative     ? ? ?            ? ? ?  ?Recommendations for follow up therapy are one component of a multi-disciplinary discharge planning process, led by the attending physician.  Recommendations may be updated based on patient status, additional functional criteria and insurance authorization. ? ?Follow Up Recommendations ? Acute inpatient rehab (3hours/day) ?  ?  ?Assistance Recommended at Discharge Intermittent Supervision/Assistance  ?Patient can  return home with the following A little help with walking and/or transfers;A little help with bathing/dressing/bathroom;Assist for transportation;Help with stairs or ramp for entrance;Assistance with cooking/housework ?  ?Equipment Recommendations ? Rolling walker (2 wheels);BSC/3in1  ?  ?Recommendations for Other Services   ? ? ?  ?Precautions / Restrictions Precautions ?Precautions: Fall ?Precaution Comments: recent spine fusion (12/22) ?Restrictions ?Weight Bearing Restrictions: No  ?  ? ?Mobility ? Bed Mobility ?Overal bed mobility: Needs Assistance ?Bed Mobility: Supine to Sit, Sidelying to Sit, Rolling, Sit to Supine ?Rolling: Min assist, +2 for safety/equipment ?Sidelying to sit: Mod assist, +2 for physical assistance ?Supine to sit: Min assist, HOB elevated ?Sit to supine: Min assist, +2 for safety/equipment ?  ?  ?  ? ?Transfers ?  ?  ?  ?  ?  ?  ?  ?  ?  ?  ?  ? ?Ambulation/Gait ?  ?  ?  ?  ?  ?  ?  ?  ? ? ?Stairs ?  ?  ?  ?  ?  ? ? ?Wheelchair Mobility ?  ? ?Modified Rankin (Stroke Patients Only) ?  ? ? ?  ?Balance Overall balance assessment: Needs assistance ?Sitting-balance support: Feet supported, Bilateral upper extremity supported ?Sitting balance-Leahy Scale: Fair ?  ?  ?  ?  ?  ?  ?  ?  ?  ?  ?  ?  ?  ?  ?  ?  ?  ? ?  ?Cognition Arousal/Alertness: Awake/alert ?Behavior During Therapy: Anxious ?Overall Cognitive Status: Within Functional Limits for tasks assessed ?  ?  ?  ?  ?  ?  ?  ?  ?  ?  ?  ?  ?  ?  ?  ?  ?  ?  ?  ? ?  ?  Exercises Other Exercises ?Other Exercises: R dix hallpike, and Epley manuever. two person assist for safety ? ?  ?General Comments   ?  ?  ? ?Pertinent Vitals/Pain Pain Assessment ?Pain Assessment: No/denies pain  ? ? ?Home Living Family/patient expects to be discharged to:: Private residence ?Living Arrangements: Children (Adult son who works during the day) ?Available Help at Discharge: Family ?Type of Home: House ?Home Access: Level entry ?  ?  ?  ?Home Layout: One  level ?Home Equipment: Shower Land (2 wheels);BSC/3in1;Transport chair;Cane - single point ?Additional Comments: Son has special needs.  ?  ?Prior Function    ?  ?  ?   ? ?PT Goals (current goals can now be found in the care plan section) Progress towards PT goals: Progressing toward goals ? ?  ?Frequency ? ? ? Min 2X/week ? ? ? ?  ?PT Plan Discharge plan needs to be updated;Equipment recommendations need to be updated  ? ? ?Co-evaluation   ?  ?  ?  ?  ? ?  ?AM-PAC PT "6 Clicks" Mobility   ?Outcome Measure ? Help needed turning from your back to your side while in a flat bed without using bedrails?: A Little ?Help needed moving from lying on your back to sitting on the side of a flat bed without using bedrails?: A Little ?Help needed moving to and from a bed to a chair (including a wheelchair)?: A Little ?Help needed standing up from a chair using your arms (e.g., wheelchair or bedside chair)?: A Little ?Help needed to walk in hospital room?: A Lot ?Help needed climbing 3-5 steps with a railing? : A Lot ?6 Click Score: 16 ? ?  ?End of Session   ?Activity Tolerance: Other (comment) (nausea) ?Patient left: in bed;with call bell/phone within reach;with bed alarm set;with family/visitor present;with nursing/sitter in room ?Nurse Communication: Mobility status ?PT Visit Diagnosis: History of falling (Z91.81);Muscle weakness (generalized) (M62.81) ?  ? ? ?Time: 8937-3428 ?PT Time Calculation (min) (ACUTE ONLY): 48 min ? ?Charges:  $Therapeutic Activity: 8-22 mins ?$Canalith Rep Proc: 23-37 mins          ?          ? ?Lieutenant Diego PT, DPT ?3:12 PM,06/17/21 ? ? ?

## 2021-06-17 NOTE — TOC Initial Note (Signed)
Transition of Care (TOC) - Initial/Assessment Note  ? ? ?Patient Details  ?Name: April Larsen ?MRN: [id number] ?Date of Birth: Aug 18, 1945 ? ?Transition of Care (TOC) CM/SW Contact:    ?Gresia Isidoro A Vedanth Sirico, LCSW ?Phone Number: ?06/17/2021, 11:18 AM ? ?Clinical Narrative:   CSW spoke with pt regarding outpatient therapy recommendation. Pt states she is already getting outpatient therapy at [facility]. Pt ask that CSW call her daughter for more information as she helps her with her care. CSW spoke with pt's daughter and confirmed pt is getting outpatient therapy at [facility]. Pt's daughter ask that CSW please send them updated referral so they can resume. Pt's daughter also concerned that pt could not walk during last PT encounter. Pt's daughter states pt lives with her Son, whom has high functioning Down Syndrome. Pt's daughter states pt must be able to get up and walk to the bathroom. Pt's daughter is on her way to the hospital and states she is going to talk to Neurology. ? ?CSW will send referral to [facility] outpatient clinic for therapy.              ? ? ?Expected Discharge Plan: Home/Self Care ?Barriers to Discharge: Continued Medical Work up ? ? ?Patient Goals and CMS Choice ?Patient states their goals for this hospitalization and ongoing recovery are:: to get better ?  ?Choice offered to / list presented to : Adult Children, Patient ? ?Expected Discharge Plan and Services ?Expected Discharge Plan: Home/Self Care ?In-house Referral: Clinical Social Work ?  ?Post Acute Care Choice: NA ?Living arrangements for the past 2 months: [place] ?                ?  ?  ?  ?  ?  ?  ?  ?  ?  ?  ? ?Prior Living Arrangements/Services ?Living arrangements for the past 2 months: [place] ?Lives with:: Adult Children ?Patient language and need for interpreter reviewed:: Yes ?Do you feel safe going back to the place where you live?: Yes      ?Need for Family Participation in Patient Care: Yes (Comment) ?Care giver  support system in place?: Yes (comment) ?  ?Criminal Activity/Legal Involvement Pertinent to Current Situation/Hospitalization: No - Comment as needed ? ?Activities of Daily Living ?Home Assistive Devices/Equipment: Cane (specify quad or straight), Walker (specify type), Eyeglasses, Bedside commode/3-in-1, Shower chair with back ?ADL Screening (condition at time of admission) ?Patient's cognitive ability adequate to safely complete daily activities?: Yes ?Is the patient deaf or have difficulty hearing?: No ?Does the patient have difficulty seeing, even when wearing glasses/contacts?: No ?Does the patient have difficulty concentrating, remembering, or making decisions?: No ?Patient able to express need for assistance with ADLs?: Yes ?Does the patient have difficulty dressing or bathing?: No ?Independently performs ADLs?: Yes (appropriate for developmental age) ?Does the patient have difficulty walking or climbing stairs?: No ?Weakness of Legs: None ?Weakness of Arms/Hands: None ? ?Permission Sought/Granted ?Permission sought to share information with : Family Supports ?Permission granted to share information with : Yes, Verbal Permission Granted ? Share Information with NAME: Dimas Alexandria ?   ? Permission granted to share info w Relationship: daughter ?   ? ?Emotional Assessment ?Appearance:: Appears stated age ?Attitude/Demeanor/Rapport: Engaged ?Affect (typically observed): Accepting ?Orientation: : Oriented to Self, Oriented to Place, Oriented to  Time, Oriented to Situation ?Alcohol / Substance Use: Not Applicable ?Psych Involvement: No (comment) ? ?Admission diagnosis:  Hypokalemia [E87.6] ?Post concussion syndrome [F07.81] ?Concussion [S06.0XAA] ?Nausea and  vomiting, unspecified vomiting type [R11.2] ?Patient Active Problem List  ? Diagnosis Date Noted  ? Concussion 06/16/2021  ? Vertigo 06/16/2021  ? Spinal surgery in prior 3 months for correction of scoliosis 06/16/2021  ? Fall (on) (from) other stairs and  steps 06/14/21, subsequent encounter 06/16/2021  ? Nausea and vomiting   ? Post concussion syndrome   ? Hypokalemia   ? Essential hypertension 10/08/2020  ? Mild dementia 06/12/2020  ? History of repair of paraesophageal hernia 02/27/2020  ? Hiatal hernia   ? Stomach irritation   ? Primary osteoarthritis of right knee 01/05/2020  ? Generalized osteoarthritis 04/13/2019  ? Osteopenia of multiple sites 04/13/2019  ? History of 2019 novel coronavirus disease (COVID-19) 01/19/2019  ? Pneumonia due to COVID-19 virus 01/09/2019  ? Acute respiratory failure with hypoxia ([place]) 01/09/2019  ? Major depressive disorder, recurrent, mild ([place]) 12/08/2018  ? Hyperlipidemia, mixed 11/19/2017  ? Diverticulitis of large intestine without perforation or abscess without bleeding 05/18/2017  ? Medicare annual wellness visit, initial 11/11/2016  ? Postmenopausal 11/09/2016  ? Diverticulitis 08/06/2016  ? Gastroesophageal reflux disease without esophagitis 08/06/2016  ? Chronic ulcerative colitis, without complications ([place]) 43/56/8616  ? Scoliosis of lumbosacral spine 01/18/2015  ? Trochanteric bursitis 11/29/2014  ? Esophagitis, reflux 10/04/2014  ? Incomplete emptying of bladder 10/29/2013  ? Female stress incontinence 08/09/2013  ? Urethral prolapse 08/09/2013  ? Urge incontinence 08/09/2013  ? Migraine 07/26/2013  ? Scoliosis (and kyphoscoliosis), idiopathic 07/26/2013  ? Ulcerative colitis ([place]) 07/26/2013  ? ?PCP:  Rusty Aus, MD ?Pharmacy:   ?[place], [address] ?[address] ?[place] [address] ?Phone: [phone] Fax: [phone] ? ?[place], [address] [place] [address] ?[address] ?[place] [address] ?Phone: [phone] Fax: [phone] ? ? ? ? ?Social Determinants of Health (SDOH) Interventions ?  ? ?Readmission Risk Interventions ?No flowsheet data found. ? ? ?

## 2021-06-17 NOTE — Assessment & Plan Note (Signed)
Replete and recheck ?

## 2021-06-18 ENCOUNTER — Ambulatory Visit: Payer: PPO | Admitting: Gastroenterology

## 2021-06-18 DIAGNOSIS — F0781 Postconcussional syndrome: Secondary | ICD-10-CM | POA: Diagnosis not present

## 2021-06-18 LAB — CBC WITH DIFFERENTIAL/PLATELET
Abs Immature Granulocytes: 0.03 10*3/uL (ref 0.00–0.07)
Basophils Absolute: 0.1 10*3/uL (ref 0.0–0.1)
Basophils Relative: 1 %
Eosinophils Absolute: 0.1 10*3/uL (ref 0.0–0.5)
Eosinophils Relative: 1 %
HCT: 35.8 % — ABNORMAL LOW (ref 36.0–46.0)
Hemoglobin: 11.6 g/dL — ABNORMAL LOW (ref 12.0–15.0)
Immature Granulocytes: 0 %
Lymphocytes Relative: 12 %
Lymphs Abs: 1.2 10*3/uL (ref 0.7–4.0)
MCH: 28.2 pg (ref 26.0–34.0)
MCHC: 32.4 g/dL (ref 30.0–36.0)
MCV: 86.9 fL (ref 80.0–100.0)
Monocytes Absolute: 1.1 10*3/uL — ABNORMAL HIGH (ref 0.1–1.0)
Monocytes Relative: 11 %
Neutro Abs: 7.5 10*3/uL (ref 1.7–7.7)
Neutrophils Relative %: 75 %
Platelets: 375 10*3/uL (ref 150–400)
RBC: 4.12 MIL/uL (ref 3.87–5.11)
RDW: 14.4 % (ref 11.5–15.5)
WBC: 10 10*3/uL (ref 4.0–10.5)
nRBC: 0 % (ref 0.0–0.2)

## 2021-06-18 LAB — BASIC METABOLIC PANEL
Anion gap: 5 (ref 5–15)
BUN: 15 mg/dL (ref 8–23)
CO2: 25 mmol/L (ref 22–32)
Calcium: 9 mg/dL (ref 8.9–10.3)
Chloride: 109 mmol/L (ref 98–111)
Creatinine, Ser: 0.53 mg/dL (ref 0.44–1.00)
GFR, Estimated: >60 mL/min (ref >60)
Glucose, Bld: 97 mg/dL (ref 70–99)
Potassium: 2.7 mmol/L — CL (ref 3.5–5.1)
Sodium: 139 mmol/L (ref 135–145)

## 2021-06-18 LAB — MAGNESIUM: Magnesium: 1.7 mg/dL (ref 1.7–2.4)

## 2021-06-18 MED ORDER — POTASSIUM CHLORIDE CRYS ER 20 MEQ PO TBCR
40.0000 meq | EXTENDED_RELEASE_TABLET | Freq: Once | ORAL | Status: AC
Start: 2021-06-18 — End: 2021-06-18
  Administered 2021-06-18: 40 meq via ORAL
  Filled 2021-06-18: qty 2

## 2021-06-18 MED ORDER — MECLIZINE HCL 25 MG PO TABS
25.0000 mg | ORAL_TABLET | Freq: Three times a day (TID) | ORAL | Status: DC | PRN
  Administered 2021-06-18 – 2021-06-20 (×5): 25 mg via ORAL
  Filled 2021-06-18 (×8): qty 1

## 2021-06-18 MED ORDER — POTASSIUM CHLORIDE 10 MEQ/100ML IV SOLN
10.0000 meq | INTRAVENOUS | Status: AC
  Administered 2021-06-18 (×4): 10 meq via INTRAVENOUS
  Filled 2021-06-18 (×4): qty 100

## 2021-06-18 NOTE — Progress Notes (Signed)
?PROGRESS NOTE ? ? ? ?April Larsen  AYT:[id number] DOB: 02-Mar-1946 DOA: 06/15/2021 ?PCP: Rusty Aus, MD  ? ? ?Brief Narrative:  ?76 y.o. female with medical history significant for HTN, ulcerative colitis, who is s/p spinal surgery in December 2022 for severe acquired scoliosis admitted for persistent vomiting and dizziness and headache following a fall the day prior ?  ?3/20 -neuro consult ?3/21: Compazine, diazepam and pain medicine/muscle relaxant per neurology.  PT, OT eval ?3/22: Dizziness symptoms have persisted.  Presentation is multifactorial.  Case discussed with neurology.  Patient has evidence of BPPV bilaterally.  Limited response to Dix-Hallpike maneuver.  Also with some occipital neuralgia secondary to trauma.  Multimodal approach to symptom management ? ? ?Assessment & Plan: ?  ?Principal Problem: ?  Post concussion syndrome ?Active Problems: ?  Vertigo ?  Fall (on) (from) other stairs and steps 06/14/21, subsequent encounter ?  Spinal surgery in prior 3 months for correction of scoliosis ?  Chronic ulcerative colitis, without complications ([place]) ?  History of repair of paraesophageal hernia ?  Essential hypertension ?  Nausea and vomiting ?  Hypokalemia ?* Post concussion syndrome ?Appreciate neurology input and active management by them ?Switching Compazine as needed, adding low-dose diazepam.  PT, OT eval ?Case discussed with neurology ?We will attempt meclizine for likely BPPV contribution ?Overall improved but symptoms not yet resolved to point the patient is medically stable for discharge ?  ?Fall (on) (from) other stairs and steps 06/14/21, subsequent encounter ?[place] backwards down steps hitting occipital scalp, second visit to ED in 24 hours ?At baseline, ambulates with cane following spinal surgery for idiopathic scoliosis in December 2022.   ?PT, OT eval ?CIR declined admission ?Patient and family prefer to go home ?We will need improvement in symptoms if this is the discharge plan ?   ?Vertigo ?Patient with intractable  vertigo, unable to tolerate changes in position,following a fall less than 48 hours ago. ?CT head x2 within the past 24 hours showed no acute abnormality ?MRI cervical spine does not show any acute pathology ?MRI brain negative for bleed or CVA ?Suspect BPPV ?Meclizine added 3/22 ?Continue therapy as tolerated ?  ?Concussion-resolved as of 06/17/2021 ?Complains of headache and vomiting and dizziness with changes of position ?Neurology consult requested ?MRI negative ?  ?Spinal surgery in prior 3 months for correction of scoliosis ?X-rays thoracolumbar spine showing intact hardware.  ?S/p surgery December 2022 (See pic below of pre and postop x-rays offered by daughter ) ?At baseline ambulates with cane ?PT, OT eval while here ?Continue with outpatient PT, OT at discharge ?  ?Hypokalemia ?Monitor and replace as necessary ?  ?Essential hypertension ?Continue Coreg ?  ?History of repair of paraesophageal hernia ?Related to prior severe scoliosis ?  ?Chronic ulcerative colitis, without complications ([place]) ?No acute issues ? ? ? ?DVT prophylaxis: SQ Lovenox ?Code Status: Full ?Family Communication: Daughter at bedside 3/22 ?Disposition Plan: Status is: Inpatient ?Remains inpatient appropriate because: Still with symptoms of significant dizziness and vertigo associated with nausea ? ? ?Level of care: Med-Surg ? ?Consultants:  ?Neurology ? ?Procedures:  ?None ? ?Antimicrobials: ?None ? ? ?Subjective: ?Seen and examined.  Resting comfortably in bed.  No visible distress.  Daughter at bedside.  Patient endorses some back pain but main complaint is dizziness with minimal movement ? ?Objective: ?Vitals:  ? 06/17/21 2001 06/18/21 3557 06/18/21 0544 06/18/21 0745  ?BP: (!) 164/84 (!) 141/90 (!) 141/90 (!) 146/94  ?Pulse: (!) 106 100 (!) 101 (!) 102  ?  Resp: 19 18 18 16   ?Temp: 98.2 ?F (36.8 ?C) 98 ?F (36.7 ?C) 98 ?F (36.7 ?C) 98 ?F (36.7 ?C)  ?TempSrc:    Oral  ?SpO2: 97% 97% 96% 95%   ? ? ?Intake/Output Summary (Last 24 hours) at 06/18/2021 1058 ?Last data filed at 06/18/2021 0543 ?Gross per 24 hour  ?Intake 1283.79 ml  ?Output 1700 ml  ?Net -416.21 ml  ? ?There were no vitals filed for this visit. ? ?Examination: ? ?General exam: Appears calm and comfortable  ?Respiratory system: Clear to auscultation. Respiratory effort normal. ?Cardiovascular system: S1-S2, RRR, no murmurs, no pedal edema ?Gastrointestinal system: Soft, NT/ND, normal bowel sounds ?Central nervous system: Alert and oriented. No focal neurological deficits.  Bilateral nystagmus 4-5 beats ?Extremities: 5/5 bilateral upper extremities, 4/5 bilateral lower extremities ?Skin: No rashes, lesions or ulcers ?Psychiatry: Judgement and insight appear normal. Mood & affect appropriate.  ? ? ? ?Data Reviewed: I have personally reviewed following labs and imaging studies ? ?CBC: ?Recent Labs  ?Lab 06/15/21 ?2154 06/18/21 ?0731  ?WBC 7.8 10.0  ?NEUTROABS 6.0 7.5  ?HGB 12.3 11.6*  ?HCT 37.4 35.8*  ?MCV 85.8 86.9  ?PLT 435* 375  ? ?Basic Metabolic Panel: ?Recent Labs  ?Lab 06/15/21 ?2154 06/16/21 ?2156 06/18/21 ?0731  ?NA 140 140 139  ?K 2.7* 3.3* 2.7*  ?CL 105 110 109  ?CO2 25 23 25   ?GLUCOSE 123* 164* 97  ?BUN 24* 17 15  ?CREATININE 0.68 0.67 0.53  ?CALCIUM 9.6 9.0 9.0  ?MG  --   --  1.7  ? ?GFR: ?Estimated Creatinine Clearance: 62.3 mL/min (by C-G formula based on SCr of 0.53 mg/dL). ?Liver Function Tests: ?Recent Labs  ?Lab 06/15/21 ?2154  ?AST 25  ?ALT 16  ?ALKPHOS 108  ?BILITOT 0.8  ?PROT 7.2  ?ALBUMIN 3.5  ? ?Recent Labs  ?Lab 06/15/21 ?2154  ?LIPASE 27  ? ?No results for input(s): AMMONIA in the last 168 hours. ?Coagulation Profile: ?No results for input(s): INR, PROTIME in the last 168 hours. ?Cardiac Enzymes: ?No results for input(s): CKTOTAL, CKMB, CKMBINDEX, TROPONINI in the last 168 hours. ?BNP (last 3 results) ?No results for input(s): PROBNP in the last 8760 hours. ?HbA1C: ?No results for input(s): HGBA1C in the last 72  hours. ?CBG: ?No results for input(s): GLUCAP in the last 168 hours. ?Lipid Profile: ?No results for input(s): CHOL, HDL, LDLCALC, TRIG, CHOLHDL, LDLDIRECT in the last 72 hours. ?Thyroid Function Tests: ?No results for input(s): TSH, T4TOTAL, FREET4, T3FREE, THYROIDAB in the last 72 hours. ?Anemia Panel: ?No results for input(s): VITAMINB12, FOLATE, FERRITIN, TIBC, IRON, RETICCTPCT in the last 72 hours. ?Sepsis Labs: ?No results for input(s): PROCALCITON, LATICACIDVEN in the last 168 hours. ? ?No results found for this or any previous visit (from the past 240 hour(s)).  ? ? ? ? ? ?Radiology Studies: ?MR CERVICAL SPINE W WO CONTRAST ? ?Result Date: 06/16/2021 ?CLINICAL DATA:  Fall with trauma to the head and neck EXAM: MRI CERVICAL SPINE WITHOUT AND WITH CONTRAST TECHNIQUE: Multiplanar and multiecho pulse sequences of the cervical spine, to include the craniocervical junction and cervicothoracic junction, were obtained without and with intravenous contrast. CONTRAST:  51m GADAVIST GADOBUTROL 1 MMOL/ML IV SOLN COMPARISON:  Cervical CT 06/14/2021.  Cervical MRI 09/29/2020. FINDINGS: Alignment: Straightening of the normal cervical lordosis. 1-2 mm anterolisthesis C7-T1. Vertebrae: No fracture or focal bone lesion. Mild cystic change of the dens. Cord: No cord compression or focal cord lesion. See below regarding stenosis. Posterior Fossa, vertebral arteries, paraspinal tissues:  Negative Disc levels: The foramen magnum is widely patent. There is ordinary mild osteoarthritis of the C1-2 articulation but no encroachment upon the neural structures. C2-3: Mild bulging of the disc. No compressive canal stenosis. Mild facet degeneration. Mild bilateral foraminal narrowing. C3-4: Spondylosis with endplate osteophytes and bulging of the disc. Narrowing of the ventral subarachnoid space but no compression of the cord. AP diameter of the canal in the midline 8.3 mm. Facet osteoarthritis more pronounced on the left. Mild bony  foraminal stenosis on the left. C4-5: Spondylosis with endplate osteophytes and bulging of the disc. Narrowing of the subarachnoid space but no compression of the cord. AP diameter of the canal in the midline 9 mm. Facet osteoarth

## 2021-06-18 NOTE — Progress Notes (Addendum)
Occupational Therapy Treatment ?Patient Details ?Name: April Larsen ?MRN: [id number] ?DOB: 1946/03/09 ?Today's Date: 06/18/2021 ? ? ?History of present illness Pt is a 76 y.o. female who presents to the ED after suffering a fall on 3/18. Pt reports she was moving too quickly while using her cane and lost her footnig and hit the back of her head. All imaging negative; currently admitted due to vertigo w/ recurrent nausea/vomiting. PmHx: Anxiety, Arthritis, Skin Cancer, Anemia, Migraine Headaches, Chronic Urinary Incontinence. ?  ?OT comments ? Chart reviewed, tx session targeted progressing and tolerance of functional mobility/ADL tasks following BPPV intervention from PT this morning. Pt reports continuing to feel dizziness however improved than yesterday. Pt tolerates transfer to [facility] with RW with CGA, toileting on BSC with MIN A. Reported dizziness and nausea while upright. Cued on compensatory strategies with minimal improvement. Pt returned to bed, reports symptoms are improved but not resolved. Discharge recommendations updated. OT will continue to follow acutely.   ? ?Recommendations for follow up therapy are one component of a multi-disciplinary discharge planning process, led by the attending physician.  Recommendations may be updated based on patient status, additional functional criteria and insurance authorization. ?   ?Follow Up Recommendations ? Skilled nursing-short term rehab (<3 hours/day)  ?  ?Assistance Recommended at Discharge Intermittent Supervision/Assistance  ?Patient can return home with the following ? A lot of help with bathing/dressing/bathroom;A lot of help with walking and/or transfers;Assistance with cooking/housework;Help with stairs or ramp for entrance;Assist for transportation ?  ?Equipment Recommendations ? None recommended by OT  ?  ?Recommendations for Other Services   ? ?  ?Precautions / Restrictions Precautions ?Precautions: Fall ?Precaution Comments: recent spine fusion  (12/22) ?Restrictions ?Weight Bearing Restrictions: No  ? ? ?  ? ?Mobility Bed Mobility ?  ?  ?  ?  ?  ?  ?  ?  ?  ? ?Transfers ?  ?  ?  ?  ?  ?  ?  ?  ?  ?  ?  ?  ?Balance   ?  ?  ?  ?  ?  ?  ?  ?  ?  ?  ?  ?  ?  ?  ?  ?  ?  ?  ?   ? ?ADL either performed or assessed with clinical judgement  ? ?ADL Overall ADL's : Needs assistance/impaired ?  ?  ?  ?  ?  ?  ?  ?  ?  ?  ?  ?  ?  ?  ?  ?  ?  ?  ?  ?General ADL Comments: supv for supine<>sit with HOB raised to approx 45 degrees, CGA for SPT with RW bed<> BSC; MIN A for toileting; Grooming tasks bed level with SET UP. Increased dizziness with seated tasks, improved when back in bed. ?  ? ?Extremity/Trunk Assessment   ?  ?  ?  ?  ?  ? ?Vision   ?  ?  ?Perception   ?  ?Praxis   ?  ? ?Cognition Arousal/Alertness: Awake/alert ?Behavior During Therapy: [facility] for tasks assessed/performed ?Overall Cognitive Status: Within Functional Limits for tasks assessed ?  ?  ?  ?  ?  ?  ?  ?  ?  ?  ?  ?  ?  ?  ?  ?  ?  ?  ?  ?   ?Exercises   ? ?  ?Shoulder Instructions   ? ? ?  ?General Comments    ? ? ?  Pertinent Vitals/ Pain       Pain Assessment ?Pain Assessment: No/denies pain ? ?Home Living   ?  ?  ?  ?  ?  ?  ?  ?  ?  ?  ?  ?  ?  ?  ?  ?  ?  ?  ? ?  ?Prior Functioning/Environment    ?  ?  ?  ?   ? ?Frequency ? Min 4X/week  ? ? ? ? ?  ?Progress Toward Goals ? ?OT Goals(current goals can now be found in the care plan section) ? Progress towards OT goals: Progressing toward goals ? ?Acute Rehab OT Goals ?Patient Stated Goal: feel better ?OT Goal Formulation: With patient ?Time For Goal Achievement: 07/02/21 ?Potential to Achieve Goals: Good  ?Plan     ? ?Co-evaluation ? ? ?   ?  ?  ?  ?  ? ?  ?AM-PAC OT "6 Clicks" Daily Activity     ?Outcome Measure ? ? Help from another person eating meals?: None ?Help from another person taking care of personal grooming?: None ?Help from another person toileting, which includes using toliet, bedpan, or urinal?: A Little ?Help from another person  bathing (including washing, rinsing, drying)?: A Lot ?Help from another person to put on and taking off regular upper body clothing?: A Little ?Help from another person to put on and taking off regular lower body clothing?: A Lot ?6 Click Score: 18 ? ?  ?End of Session Equipment Utilized During Treatment: Rolling walker (2 wheels) ? ?OT Visit Diagnosis: Other abnormalities of gait and mobility (R26.89);BPPV;Muscle weakness (generalized) (M62.81);History of falling (Z91.81) ?  ?Activity Tolerance Other (comment) (limited by dizziness) ?  ?Patient Left in bed ?  ?Nurse Communication Mobility status ?  ? ?   ? ?Time: 6578-4696 ?OT Time Calculation (min): 23 min ? ?Charges: OT General Charges ?$OT Visit: 1 Visit ?OT Treatments ?$Self Care/Home Management : 23-37 mins ? ?Shanon Payor, OTD OTR/L  ?06/18/21, 12:45 PM  ?

## 2021-06-18 NOTE — Progress Notes (Signed)
Neurology Progress Note ? ? ?S:// ?Seen and examined ?Dizziness still not improved. ?Headache improved. ?PT eval concerning for b/l BPPV. ?Epley was done for right yesterday, somewhat helpful but not fully effective. ?PT recommends short term rehab v CIR ? ? ?O:// ?Current vital signs: ?BP (!) 146/94 (BP Location: Right Wrist)   Pulse (!) 102   Temp 98 ?F (36.7 ?C) (Oral)   Resp 16   SpO2 95%  ?Vital signs in last 24 hours: ?Temp:  [97.7 ?F (36.5 ?C)-98.2 ?F (36.8 ?C)] 98 ?F (36.7 ?C) (03/22 0745) ?Pulse Rate:  [100-106] 102 (03/22 0745) ?Resp:  [16-19] 16 (03/22 0745) ?BP: (141-164)/(81-94) 146/94 (03/22 0745) ?SpO2:  [95 %-99 %] 95 % (03/22 0745) ?General: WD WN NAD ?HEENt: Swelling on the occipital region bilaterally ?CVS: Regular rhythm ?Respiratory breathing well saturating normally on room air ?Extremity warm well perfused ?Abdomen nondistended nontender ?Neurological exam ?Awake alert oriented x3.  No dysarthria.  No aphasia.  Cranial nerves II to XII intact with questionable 2-3 beat and gaze nystagmus bilaterally.  Motor examination with symmetric 5/5 strength in the upper extremities and 4+/5 strength with some restricted movement due to pain in bilateral lower extremities.  Sensation intact to light touch without extinction.  Brisk reflexes in the lower extremities with ankle clonus which is sustained on the left and 4-5 beats on the right.  Unchanged from yesterday. ? ?Medications ? ?Current Facility-Administered Medications:  ?  acetaminophen (TYLENOL) tablet 650 mg, 650 mg, Oral, Q6H PRN **OR** acetaminophen (TYLENOL) suppository 650 mg, 650 mg, Rectal, Q6H PRN, Foust, Katy L, NP ?  carvedilol (COREG) tablet 3.125 mg, 3.125 mg, Oral, BID WC, Manuella Ghazi, Vipul, MD, 3.125 mg at 06/18/21 4944 ?  diazepam (VALIUM) tablet 2 mg, 2 mg, Oral, Q6H PRN, Amie Portland, MD, 2 mg at 06/17/21 2113 ?  enoxaparin (LOVENOX) injection 40 mg, 40 mg, Subcutaneous, Q24H, Athena Masse, MD, 40 mg at 06/18/21 0810 ?   meclizine (ANTIVERT) tablet 25 mg, 25 mg, Oral, TID PRN, Amie Portland, MD ?  ondansetron [facility]) tablet 4 mg, 4 mg, Oral, Q6H PRN, 4 mg at 06/18/21 0810 **OR** ondansetron (ZOFRAN) injection 4 mg, 4 mg, Intravenous, Q6H PRN, Athena Masse, MD, 4 mg at 06/17/21 1445 ?  potassium chloride 10 mEq in 100 mL IVPB, 10 mEq, Intravenous, Q1 Hr x 4, Sreenath, Sudheer B, MD, Last Rate: 100 mL/hr at 06/18/21 1208, 10 mEq at 06/18/21 1208 ?  traMADol (ULTRAM) tablet 50 mg, 50 mg, Oral, Q6H PRN, Athena Masse, MD ?Labs ?CBC ?   ?Component Value Date/Time  ? WBC 10.0 06/18/2021 0731  ? RBC 4.12 06/18/2021 0731  ? HGB 11.6 (L) 06/18/2021 0731  ? HGB 15.5 12/03/2020 1150  ? HCT 35.8 (L) 06/18/2021 0731  ? HCT 45.2 12/03/2020 1150  ? PLT 375 06/18/2021 0731  ? PLT 412 12/03/2020 1150  ? MCV 86.9 06/18/2021 0731  ? MCV 93 12/03/2020 1150  ? MCV 88 05/18/2013 0741  ? MCH 28.2 06/18/2021 0731  ? MCHC 32.4 06/18/2021 0731  ? RDW 14.4 06/18/2021 0731  ? RDW 12.5 12/03/2020 1150  ? RDW 14.0 05/18/2013 0741  ? LYMPHSABS 1.2 06/18/2021 0731  ? LYMPHSABS 2.0 05/18/2013 0741  ? MONOABS 1.1 (H) 06/18/2021 0731  ? MONOABS 1.2 (H) 05/18/2013 0741  ? EOSABS 0.1 06/18/2021 0731  ? EOSABS 0.1 05/18/2013 0741  ? BASOSABS 0.1 06/18/2021 0731  ? BASOSABS 0.1 05/18/2013 0741  ? ? ?CMP  ?   ?Component Value  Date/Time  ? NA 139 06/18/2021 0731  ? NA 135 12/03/2020 1150  ? NA 140 03/29/2012 1120  ? K 2.7 (LL) 06/18/2021 0731  ? K 3.7 03/29/2012 1120  ? CL 109 06/18/2021 0731  ? CL 110 (H) 03/29/2012 1120  ? CO2 25 06/18/2021 0731  ? CO2 20 (L) 03/29/2012 1120  ? GLUCOSE 97 06/18/2021 0731  ? GLUCOSE 70 03/29/2012 1120  ? BUN 15 06/18/2021 0731  ? BUN 22 12/03/2020 1150  ? BUN 17 03/29/2012 1120  ? CREATININE 0.53 06/18/2021 0731  ? CREATININE 0.74 03/29/2012 1120  ? CALCIUM 9.0 06/18/2021 0731  ? CALCIUM 8.9 03/29/2012 1120  ? PROT 7.2 06/15/2021 2154  ? PROT 7.2 12/03/2020 1150  ? PROT 7.5 03/29/2012 1120  ? ALBUMIN 3.5 06/15/2021 2154  ? ALBUMIN  4.6 12/03/2020 1150  ? ALBUMIN 4.1 03/29/2012 1120  ? AST 25 06/15/2021 2154  ? AST 25 03/29/2012 1120  ? ALT 16 06/15/2021 2154  ? ALT 29 03/29/2012 1120  ? ALKPHOS 108 06/15/2021 2154  ? ALKPHOS 124 03/29/2012 1120  ? BILITOT 0.8 06/15/2021 2154  ? BILITOT 0.6 12/03/2020 1150  ? BILITOT 0.3 03/29/2012 1120  ? GFRNONAA >60 06/18/2021 0731  ? GFRNONAA >60 03/29/2012 1120  ? GFRAA >60 01/14/2019 0106  ? GFRAA >60 03/29/2012 1120  ? ? ?Imaging ?I have reviewed images in epic and the results pertinent to this consultation are: ?CT-head and neck 06/14/2021 ?IMPRESSION: ?No acute intracranial abnormality. No calvarial fracture. Small ?right parietal scalp hematoma. ?  ?No acute fracture or traumatic listhesis of the cervical spine. ?  ?Multilevel degenerative disc and degenerative joint disease, as ?described above, with resultant multilevel moderate to severe ?neuroforaminal narrowing and moderate central canal stenosis at ?C3-4. ?  ?CT head 06/16/2021 ?IMPRESSION: ?No evidence of acute intracranial abnormality. Mild small vessel ?ischemic changes. ?  ?No interval change from recent CT. ?  ?MRI examination of the brain 06/16/2021 ?IMPRESSION: ?1. No acute intracranial abnormality with essentially normal for age ?noncontrast MRI appearance of the brain. ?2. Extensive bilateral posterior scalp hematoma. ? ?MRI C-spine 06/16/2021 ?IMPRESSION: ?No acute or traumatic finding in the cervical region. ?Chronic degenerative spondylosis as outlined above. Mild narrowing ?of the canal from C3-4 through C6-7, but no compressive canal ?stenosis. Foraminal narrowing due to osteophytic encroachment with ?some potential for relevant foraminal stenosis on the left at C3-4, ?on the left at C4-5, bilateral at C5-6 and on the right at C6-7. No ?change is appreciated from the recent CT or MRI. ?   ?Assessment:  ?76 year old with complaints of intractable headache that started after she sustained a fall and hit her back of the head to the  ground.  She has a swelling around the midline on the back of her head along with bruising.  Likely headaches from the swelling and the bruise as well as possible irritation of the occipital nerves causing occipital neuralgia as well as cervicalgia as she also complains of neck pain.  Has degenerative cervical spine-unchanged on MRI from before. ?At this time, her headache has a multifactorial etiology-postconcussive along with occipital neuralgia and possible component of cervicalgia-also has history of migraines which predisposes her to headaches. She is intolerant to NSAIDs due to previous colon surgery. ?IV fluids, 1 dose of Solu-Medrol, and Compazine have been helpful in reducing the headache from a 10 out of 10 to about 2-3 out of 10. ?Continues to complain of some dizziness/vertiginous symptoms. PT performed Dix-Hallpike and she seems to have b/l  BPPV with minimal relief after Epley maneuver. ? ?Recommendations: ?-Switch compazine to Meclizine - better for BPPV ?-Continue  low-dose diazepam-53m q6h prn for dizziness/vertiginous symptoms/muscle spasms ?-Given her advanced age I would hesitate to add other medications such as muscle relaxants which can sometimes help neck stiffness/cervicalgia but if need be, can consider low-dose muscle relaxants ?-PT OT ?-No need for further steroids or valproate as headache is well controlled. ? ?Plan d/w patient and Dr SPriscella Mann?-- ?AAmie Portland MD ?Neurologist ?[facility] ?Pager: [phone]?

## 2021-06-18 NOTE — NC FL2 (Signed)
?[place] MEDICAID FL2 LEVEL OF CARE SCREENING TOOL  ?  ? ?IDENTIFICATION  ?Patient Name: ?April Larsen Birthdate: [birth year] Sex: female Admission Date (Current Location): ?06/15/2021  ?[place] and [place] Number: ? [place] ?  Facility and Address:  ?[facility], [address], [place], [place] [address] ?     Provider Number: ?[id number]  ?Attending Physician Name and Address:  ?Sidney Ace, MD ? Relative Name and Phone Number:  ?  ?   ?Current Level of Care: ?Hospital Recommended Level of Care: ?[place] Prior Approval Number: ?  ? ?Date Approved/Denied: ?  PASRR Number: ?[id number] A ? ?Discharge Plan: ?SNF ?  ? ?Current Diagnoses: ?Patient Active Problem List  ? Diagnosis Date Noted  ? Vertigo 06/16/2021  ? Spinal surgery in prior 3 months for correction of scoliosis 06/16/2021  ? Fall (on) (from) other stairs and steps 06/14/21, subsequent encounter 06/16/2021  ? Nausea and vomiting   ? Post concussion syndrome   ? Hypokalemia   ? Essential hypertension 10/08/2020  ? Mild dementia 06/12/2020  ? History of repair of paraesophageal hernia 02/27/2020  ? Hiatal hernia   ? Stomach irritation   ? Primary osteoarthritis of right knee 01/05/2020  ? Generalized osteoarthritis 04/13/2019  ? Osteopenia of multiple sites 04/13/2019  ? History of 2019 novel coronavirus disease (COVID-19) 01/19/2019  ? Pneumonia due to COVID-19 virus 01/09/2019  ? Acute respiratory failure with hypoxia ([place]) 01/09/2019  ? Major depressive disorder, recurrent, mild ([place]) 12/08/2018  ? Hyperlipidemia, mixed 11/19/2017  ? Diverticulitis of large intestine without perforation or abscess without bleeding 05/18/2017  ? Medicare annual wellness visit, initial 11/11/2016  ? Postmenopausal 11/09/2016  ? Diverticulitis 08/06/2016  ? Gastroesophageal reflux disease without esophagitis 08/06/2016  ? Chronic ulcerative colitis, without complications ([place]) 84/13/2440  ? Scoliosis of lumbosacral spine  01/18/2015  ? Trochanteric bursitis 11/29/2014  ? Esophagitis, reflux 10/04/2014  ? Incomplete emptying of bladder 10/29/2013  ? Female stress incontinence 08/09/2013  ? Urethral prolapse 08/09/2013  ? Urge incontinence 08/09/2013  ? Migraine 07/26/2013  ? Scoliosis (and kyphoscoliosis), idiopathic 07/26/2013  ? Ulcerative colitis ([place]) 07/26/2013  ? ? ?Orientation RESPIRATION BLADDER Height & Weight   ?  ?Self, Time, Situation, Place ? Normal Continent Weight:   ?Height:     ?BEHAVIORAL SYMPTOMS/MOOD NEUROLOGICAL BOWEL NUTRITION STATUS  ?    Continent Diet (regular diet, thin liquids)  ?AMBULATORY STATUS COMMUNICATION OF NEEDS Skin   ?Limited Assist Verbally Normal ?  ?  ?  ?    ?     ?     ? ? ?Personal Care Assistance Level of Assistance  ?Dressing, Feeding, Bathing Bathing Assistance: Limited assistance ?Feeding assistance: Independent ?Dressing Assistance: Limited assistance ?   ? ?Functional Limitations Info  ?Sight, Hearing, Speech Sight Info: Adequate ?Hearing Info: Adequate ?Speech Info: Adequate  ? ? ?SPECIAL CARE FACTORS FREQUENCY  ?PT (By licensed PT), OT (By licensed OT)   ?  ?PT Frequency: 5x ?OT Frequency: 5x ?  ?  ?  ?   ? ? ?Contractures Contractures Info: Not present  ? ? ?Additional Factors Info  ?Code Status, Allergies Code Status Info: full code ?Allergies Info: Nsaids, Sulfa Antibiotics ?  ?  ?  ?   ? ?Current Medications (06/18/2021):  This is the current hospital active medication list ?Current Facility-Administered Medications  ?Medication Dose Route Frequency Provider Last Rate Last Admin  ? acetaminophen (TYLENOL) tablet 650 mg  650 mg Oral Q6H PRN Neomia Glass  L, NP      ? Or  ? acetaminophen (TYLENOL) suppository 650 mg  650 mg Rectal Q6H PRN Foust, Katy L, NP      ? carvedilol (COREG) tablet 3.125 mg  3.125 mg Oral BID WC Manuella Ghazi, Vipul, MD   3.125 mg at 06/18/21 0810  ? diazepam (VALIUM) tablet 2 mg  2 mg Oral Q6H PRN Amie Portland, MD   2 mg at 06/17/21 2113  ? enoxaparin (LOVENOX)  injection 40 mg  40 mg Subcutaneous Q24H Athena Masse, MD   40 mg at 06/18/21 0810  ? meclizine (ANTIVERT) tablet 25 mg  25 mg Oral TID PRN Amie Portland, MD   25 mg at 06/18/21 1351  ? ondansetron (ZOFRAN) tablet 4 mg  4 mg Oral Q6H PRN Athena Masse, MD   4 mg at 06/18/21 0810  ? Or  ? ondansetron (ZOFRAN) injection 4 mg  4 mg Intravenous Q6H PRN Athena Masse, MD   4 mg at 06/17/21 1445  ? traMADol (ULTRAM) tablet 50 mg  50 mg Oral Q6H PRN Athena Masse, MD      ? ? ? ?Discharge Medications: ?Please see discharge summary for a list of discharge medications. ? ?Relevant Imaging Results: ? ?Relevant Lab Results: ? ? ?Additional Information ?UUE:[id number] ? ?Arietta Eisenstein A Midge Momon, LCSW ? ? ? ? ?

## 2021-06-18 NOTE — Progress Notes (Signed)
Physical Therapy Treatment ?Patient Details ?Name: April Larsen ?MRN: [id number] ?DOB: Oct 10, 1945 ?Today's Date: 06/18/2021 ? ? ?History of Present Illness Pt is a 76 y.o. female who presents to the ED after suffering a fall on 3/18. Pt reports she was moving too quickly while using her cane and lost her footnig and hit the back of her head. All imaging negative; currently admitted due to vertigo w/ recurrent nausea/vomiting. PmHx: Anxiety, Arthritis, Skin Cancer, Anemia, Migraine Headaches, Chronic Urinary Incontinence. ? ?  ?PT Comments  ? ? Patient alert, agreeable to PT session. Session focused on vestibular treatment; R epley performed. Pt noted to have decreased intensity and decreased duration of nystagmus this AM. L dixhall pike, and epley x2 performed on the L due to pt noted to be positive on L side as well. Pt in bed, with all needs in reach at end of session. ? ?Prior to vestibular interventions, pt was able to perform supine <> sit with supervision/CGA (with elevated HOB), and sit <> stand with RW and CGA (cued for hand placement for safety). She was able to take 2-3 steps towards [facility] with CGA, but limited by nausea and dizziness.  Recommendation updated at this time to SNF due to current level of assistance needed for functional mobility.  ? ?   ?Recommendations for follow up therapy are one component of a multi-disciplinary discharge planning process, led by the attending physician.  Recommendations may be updated based on patient status, additional functional criteria and insurance authorization. ? ?Follow Up Recommendations ? Skilled nursing-short term rehab (<3 hours/day) ?  ?  ?Assistance Recommended at Discharge Frequent or constant Supervision/Assistance  ?Patient can return home with the following A little help with walking and/or transfers;A little help with bathing/dressing/bathroom;Assist for transportation;Help with stairs or ramp for entrance;Assistance with cooking/housework ?  ?Equipment  Recommendations ? Rolling walker (2 wheels);BSC/3in1  ?  ?Recommendations for Other Services   ? ? ?  ?Precautions / Restrictions Precautions ?Precautions: Fall ?Precaution Comments: recent spine fusion (12/22) ?Restrictions ?Weight Bearing Restrictions: No  ?  ? ?Mobility ? Bed Mobility ?Overal bed mobility: Needs Assistance ?Bed Mobility: Supine to Sit, Sit to Supine ?Rolling: Min assist, +2 for safety/equipment ?Sidelying to sit: Min assist, +2 for safety/equipment ?Supine to sit: Supervision, HOB elevated ?Sit to supine: Min assist ?  ?  ?  ? ?Transfers ?Overall transfer level: Needs assistance ?Equipment used: Rolling walker (2 wheels) ?Transfers: Sit to/from Stand ?Sit to Stand: Min guard ?  ?  ?  ?  ?  ?  ?  ? ?Ambulation/Gait ?Ambulation/Gait assistance: Min guard ?Gait Distance (Feet): 2 Feet ?Assistive device: Rolling walker (2 wheels) ?  ?  ?  ?  ?General Gait Details: pt able to take several steps towards [facility], twice. limited by nausea and pt reported "spinning" ? ? ?Stairs ?  ?  ?  ?  ?  ? ? ?Wheelchair Mobility ?  ? ?Modified Rankin (Stroke Patients Only) ?  ? ? ?  ?Balance Overall balance assessment: Needs assistance ?Sitting-balance support: Feet supported, Bilateral upper extremity supported ?Sitting balance-Leahy Scale: Fair ?Sitting balance - Comments: heavy reliance of BUE support on EOB during static sitting. ?  ?Standing balance support: During functional activity, Reliant on assistive device for balance ?Standing balance-Leahy Scale: Fair ?Standing balance comment: Limited 2/2 increased dizziness upon standing. ?  ?  ?  ?  ?  ?  ?  ?  ?  ?  ?  ?  ? ?  ?Cognition  Arousal/Alertness: Awake/alert ?Behavior During Therapy: [facility] for tasks assessed/performed ?Overall Cognitive Status: Within Functional Limits for tasks assessed ?  ?  ?  ?  ?  ?  ?  ?  ?  ?  ?  ?  ?  ?  ?  ?  ?  ?  ?  ? ?  ?Exercises   ? ?  ?General Comments   ?  ?  ? ?Pertinent Vitals/Pain Pain Assessment ?Pain Assessment: No/denies  pain  ? ? ?Home Living   ?  ?  ?  ?  ?  ?  ?  ?  ?  ?   ?  ?Prior Function    ?  ?  ?   ? ?PT Goals (current goals can now be found in the care plan section) Progress towards PT goals: Progressing toward goals ? ?  ?Frequency ? ? ? Min 2X/week ? ? ? ?  ?PT Plan Discharge plan needs to be updated;Equipment recommendations need to be updated  ? ? ?Co-evaluation   ?  ?  ?  ?  ? ?  ?AM-PAC PT "6 Clicks" Mobility   ?Outcome Measure ? Help needed turning from your back to your side while in a flat bed without using bedrails?: A Little ?Help needed moving from lying on your back to sitting on the side of a flat bed without using bedrails?: A Little ?Help needed moving to and from a bed to a chair (including a wheelchair)?: A Little ?Help needed standing up from a chair using your arms (e.g., wheelchair or bedside chair)?: A Little ?Help needed to walk in hospital room?: A Lot ?Help needed climbing 3-5 steps with a railing? : A Lot ?6 Click Score: 16 ? ?  ?End of Session   ?Activity Tolerance: Other (comment) (limited by nausea, dizziness) ?Patient left: in bed;with call bell/phone within reach;with bed alarm set;with family/visitor present;with nursing/sitter in room ?Nurse Communication: Mobility status ?PT Visit Diagnosis: History of falling (Z91.81);Muscle weakness (generalized) (M62.81) ?  ? ? ?Time: [phone] ?PT Time Calculation (min) (ACUTE ONLY): 42 min ? ?Charges:  $Therapeutic Activity: 8-22 mins ?$Canalith Rep Proc: 23-37 mins          ?          ? ?Lieutenant Diego PT, DPT ?9:54 AM,06/18/21 ? ? ?

## 2021-06-19 DIAGNOSIS — F0781 Postconcussional syndrome: Secondary | ICD-10-CM | POA: Diagnosis not present

## 2021-06-19 DIAGNOSIS — E876 Hypokalemia: Secondary | ICD-10-CM | POA: Diagnosis not present

## 2021-06-19 DIAGNOSIS — R112 Nausea with vomiting, unspecified: Secondary | ICD-10-CM | POA: Diagnosis not present

## 2021-06-19 LAB — CBC WITH DIFFERENTIAL/PLATELET
Abs Immature Granulocytes: 0.03 10*3/uL (ref 0.00–0.07)
Basophils Absolute: 0.1 10*3/uL (ref 0.0–0.1)
Basophils Relative: 1 %
Eosinophils Absolute: 0.3 10*3/uL (ref 0.0–0.5)
Eosinophils Relative: 3 %
HCT: 37.7 % (ref 36.0–46.0)
Hemoglobin: 12.4 g/dL (ref 12.0–15.0)
Immature Granulocytes: 0 %
Lymphocytes Relative: 12 %
Lymphs Abs: 1.3 10*3/uL (ref 0.7–4.0)
MCH: 27.9 pg (ref 26.0–34.0)
MCHC: 32.9 g/dL (ref 30.0–36.0)
MCV: 84.7 fL (ref 80.0–100.0)
Monocytes Absolute: 1.2 10*3/uL — ABNORMAL HIGH (ref 0.1–1.0)
Monocytes Relative: 11 %
Neutro Abs: 7.6 10*3/uL (ref 1.7–7.7)
Neutrophils Relative %: 73 %
Platelets: 377 10*3/uL (ref 150–400)
RBC: 4.45 MIL/uL (ref 3.87–5.11)
RDW: 14.3 % (ref 11.5–15.5)
WBC: 10.3 10*3/uL (ref 4.0–10.5)
nRBC: 0 % (ref 0.0–0.2)

## 2021-06-19 LAB — BASIC METABOLIC PANEL
Anion gap: 9 (ref 5–15)
BUN: 17 mg/dL (ref 8–23)
CO2: 26 mmol/L (ref 22–32)
Calcium: 9.2 mg/dL (ref 8.9–10.3)
Chloride: 107 mmol/L (ref 98–111)
Creatinine, Ser: 0.66 mg/dL (ref 0.44–1.00)
GFR, Estimated: >60 mL/min (ref >60)
Glucose, Bld: 102 mg/dL — ABNORMAL HIGH (ref 70–99)
Potassium: 3 mmol/L — ABNORMAL LOW (ref 3.5–5.1)
Sodium: 142 mmol/L (ref 135–145)

## 2021-06-19 LAB — MAGNESIUM: Magnesium: 1.7 mg/dL (ref 1.7–2.4)

## 2021-06-19 MED ORDER — DULOXETINE HCL 30 MG PO CPEP
60.0000 mg | ORAL_CAPSULE | Freq: Every day | ORAL | Status: DC
Start: 2021-06-19 — End: 2021-06-23
  Administered 2021-06-19 – 2021-06-22 (×4): 60 mg via ORAL
  Filled 2021-06-19 (×4): qty 2

## 2021-06-19 MED ORDER — METOCLOPRAMIDE HCL 5 MG/ML IJ SOLN
10.0000 mg | Freq: Three times a day (TID) | INTRAMUSCULAR | Status: DC
  Administered 2021-06-19 – 2021-06-21 (×9): 10 mg via INTRAVENOUS
  Filled 2021-06-19 (×9): qty 2

## 2021-06-19 MED ORDER — DIAZEPAM 2 MG PO TABS
2.0000 mg | ORAL_TABLET | ORAL | Status: DC | PRN
  Administered 2021-06-19 – 2021-06-20 (×2): 2 mg via ORAL
  Filled 2021-06-19 (×2): qty 1

## 2021-06-19 MED ORDER — CARVEDILOL 6.25 MG PO TABS
6.2500 mg | ORAL_TABLET | Freq: Two times a day (BID) | ORAL | Status: DC
  Administered 2021-06-19 – 2021-06-23 (×8): 6.25 mg via ORAL
  Filled 2021-06-19 (×8): qty 1

## 2021-06-19 MED ORDER — SODIUM CHLORIDE 0.9 % IV BOLUS
1000.0000 mL | Freq: Once | INTRAVENOUS | Status: AC
  Administered 2021-06-19: 1000 mL via INTRAVENOUS

## 2021-06-19 MED ORDER — COLESTIPOL HCL 1 G PO TABS
1.0000 g | ORAL_TABLET | Freq: Every day | ORAL | Status: DC
  Administered 2021-06-19 – 2021-06-23 (×5): 1 g via ORAL
  Filled 2021-06-19 (×5): qty 1

## 2021-06-19 MED ORDER — BUPROPION HCL ER (XL) 150 MG PO TB24
150.0000 mg | ORAL_TABLET | Freq: Every day | ORAL | Status: DC
  Administered 2021-06-19 – 2021-06-23 (×5): 150 mg via ORAL
  Filled 2021-06-19 (×5): qty 1

## 2021-06-19 NOTE — Progress Notes (Signed)
OT Cancellation Note ? ?Patient Details ?Name: April Larsen ?MRN: [id number] ?DOB: 04-Apr-1945 ? ? ?Cancelled Treatment:    Reason Eval/Treat Not Completed: Other (comment) (pt declined, requested to rest as she had just been up to the bedside commode. Will reattempt as able.) ? ?Shanon Payor, OTD OTR/L  ?06/19/21, 2:04 PM  ?

## 2021-06-19 NOTE — Progress Notes (Signed)
?PROGRESS NOTE ? ? ? ?ROYCE SCIARA  GHW:[id number] DOB: [birth year] DOA: 06/15/2021 ?PCP: Rusty Aus, MD  ? ? ?Brief Narrative:  ?76 y.o. female with medical history significant for HTN, ulcerative colitis, who is s/p spinal surgery in December 2022 for severe acquired scoliosis admitted for persistent vomiting and dizziness and headache following a fall the day prior ?  ?3/20 -neuro consult ?3/21: Compazine, diazepam and pain medicine/muscle relaxant per neurology.  PT, OT eval ?3/22: Dizziness symptoms have persisted.  Presentation is multifactorial.  Case discussed with neurology.  Patient has evidence of BPPV bilaterally.  Limited response to Dix-Hallpike maneuver.  Also with some occipital neuralgia secondary to trauma.  Multimodal approach to symptom management ?3/23: No appreciable change in symptoms ? ? ?Assessment & Plan: ?  ?Principal Problem: ?  Post concussion syndrome ?Active Problems: ?  Vertigo ?  Fall (on) (from) other stairs and steps 06/14/21, subsequent encounter ?  Spinal surgery in prior 3 months for correction of scoliosis ?  Chronic ulcerative colitis, without complications ([place]) ?  History of repair of paraesophageal hernia ?  Essential hypertension ?  Nausea and vomiting ?  Hypokalemia ?* Post concussion syndrome ?Appreciate neurology input ?Still with progressive symptoms of nausea and dizziness ?Headache improved ?Plan: ?DC Ultram ?Standing Reglan 10 mg IV 3 times daily ?Low-dose diazepam every 4 hours ?Additional fluid hydration ?Continue PT evaluations ?  ?Fall (on) (from) other stairs and steps 06/14/21, subsequent encounter ?[place] [address] backwards down steps hitting occipital scalp, second visit to ED in 24 hours ?At baseline, ambulates with cane following spinal surgery for idiopathic scoliosis in December 2022.   ?PT, OT eval ?CIR declined admission ?Patient and family prefer to go home ?We will need improvement in symptoms if this is the discharge plan ?  ?Vertigo ?Patient with intractable   vertigo, unable to tolerate changes in position,following a fall less than 48 hours ago. ?CT head x2 within the past 24 hours showed no acute abnormality ?MRI cervical spine does not show any acute pathology ?MRI brain negative for bleed or CVA ?Suspect BPPV ?Meclizine added 3/22 ?Still with vertigo symptoms ?Continue therapy as tolerated ?  ?Concussion-resolved as of 06/17/2021 ?Complains of headache and vomiting and dizziness with changes of position ?Neurology consult requested ?MRI negative ?  ?Spinal surgery in prior 3 months for correction of scoliosis ?X-rays thoracolumbar spine showing intact hardware.  ?S/p surgery December 2022 (See pic below of pre and postop x-rays offered by daughter ) ?At baseline ambulates with cane ?PT, OT eval while here ?Continue with outpatient PT, OT at discharge ?  ?Hypokalemia ?Monitor and replace as necessary ?  ?Essential hypertension ?Continue Coreg ?  ?History of repair of paraesophageal hernia ?Related to prior severe scoliosis ?  ?Chronic ulcerative colitis, without complications ([place]) ?No acute issues ? ? ? ?DVT prophylaxis: SQ Lovenox ?Code Status: Full ?Family Communication: Daughter at bedside 3/22, 3/23 ?Disposition Plan: Status is: Inpatient ?Remains inpatient appropriate because: Still with symptoms of significant dizziness and vertigo associated with nausea ? ? ?Level of care: Med-Surg ? ?Consultants:  ?Neurology ? ?Procedures:  ?None ? ?Antimicrobials: ?None ? ? ?Subjective: ?Patient seen and examined.  Continues to endorse severe dizziness associated with nausea.  Headache improved ? ?Objective: ?Vitals:  ? 06/18/21 1540 06/18/21 2046 06/19/21 0621 06/19/21 0807  ?BP: (!) 150/88 (!) 145/79 (!) 141/70 136/76  ?Pulse: (!) 107 (!) 106 (!) 110 (!) 107  ?Resp: 16 15 20 18   ?Temp: 98.1 ?F (36.7 ?C) 97.8 ?F (36.6 ?  C) 98.2 ?F (36.8 ?C) 97.6 ?F (36.4 ?C)  ?TempSrc:      ?SpO2: 99% 99% 95% 96%  ? ? ?Intake/Output Summary (Last 24 hours) at 06/19/2021 1054 ?Last data filed  at 06/19/2021 0500 ?Gross per 24 hour  ?Intake 400 ml  ?Output 1900 ml  ?Net -1500 ml  ? ?There were no vitals filed for this visit. ? ?Examination: ? ?General exam: NAD ?Respiratory system: Clear to auscultation. Respiratory effort normal. ?Cardiovascular system: S1-S2, RRR, no murmurs, no pedal edema ?Gastrointestinal system: Soft, NT/ND, normal bowel sounds ?Central nervous system: Alert and oriented. No focal neurological deficits.  Bilateral nystagmus 4-5 beats, gait not assessed ?Extremities: 5/5 bilateral upper extremities, 4/5 bilateral lower extremities ?Skin: No rashes, lesions or ulcers ?Psychiatry: Judgement and insight appear normal. Mood & affect appropriate.  ? ? ? ?Data Reviewed: I have personally reviewed following labs and imaging studies ? ?CBC: ?Recent Labs  ?Lab 06/15/21 ?2154 06/18/21 ?0731 06/19/21 ?0750  ?WBC 7.8 10.0 10.3  ?NEUTROABS 6.0 7.5 7.6  ?HGB 12.3 11.6* 12.4  ?HCT 37.4 35.8* 37.7  ?MCV 85.8 86.9 84.7  ?PLT 435* 375 377  ? ?Basic Metabolic Panel: ?Recent Labs  ?Lab 06/15/21 ?2154 06/16/21 ?2156 06/18/21 ?0731 06/19/21 ?0750  ?NA 140 140 139 142  ?K 2.7* 3.3* 2.7* 3.0*  ?CL 105 110 109 107  ?CO2 25 23 25 26   ?GLUCOSE 123* 164* 97 102*  ?BUN 24* 17 15 17   ?CREATININE 0.68 0.67 0.53 0.66  ?CALCIUM 9.6 9.0 9.0 9.2  ?MG  --   --  1.7 1.7  ? ?GFR: ?Estimated Creatinine Clearance: 62.3 mL/min (by C-G formula based on SCr of 0.66 mg/dL). ?Liver Function Tests: ?Recent Labs  ?Lab 06/15/21 ?2154  ?AST 25  ?ALT 16  ?ALKPHOS 108  ?BILITOT 0.8  ?PROT 7.2  ?ALBUMIN 3.5  ? ?Recent Labs  ?Lab 06/15/21 ?2154  ?LIPASE 27  ? ?No results for input(s): AMMONIA in the last 168 hours. ?Coagulation Profile: ?No results for input(s): INR, PROTIME in the last 168 hours. ?Cardiac Enzymes: ?No results for input(s): CKTOTAL, CKMB, CKMBINDEX, TROPONINI in the last 168 hours. ?BNP (last 3 results) ?No results for input(s): PROBNP in the last 8760 hours. ?HbA1C: ?No results for input(s): HGBA1C in the last 72  hours. ?CBG: ?No results for input(s): GLUCAP in the last 168 hours. ?Lipid Profile: ?No results for input(s): CHOL, HDL, LDLCALC, TRIG, CHOLHDL, LDLDIRECT in the last 72 hours. ?Thyroid Function Tests: ?No results for input(s): TSH, T4TOTAL, FREET4, T3FREE, THYROIDAB in the last 72 hours. ?Anemia Panel: ?No results for input(s): VITAMINB12, FOLATE, FERRITIN, TIBC, IRON, RETICCTPCT in the last 72 hours. ?Sepsis Labs: ?No results for input(s): PROCALCITON, LATICACIDVEN in the last 168 hours. ? ?No results found for this or any previous visit (from the past 240 hour(s)).  ? ? ? ? ? ?Radiology Studies: ?No results found. ? ? ? ? ? ?Scheduled Meds: ? carvedilol  6.25 mg Oral BID WC  ? enoxaparin (LOVENOX) injection  40 mg Subcutaneous Q24H  ? metoCLOPramide (REGLAN) injection  10 mg Intravenous Q8H  ? ?Continuous Infusions: ? ? ? ? LOS: 3 days  ? ? ? ? ? ?Sidney Ace, MD ?[facility] ? ? ?If 7PM-7AM, please contact night-coverage ? ?06/19/2021, 10:54 AM  ? ?

## 2021-06-19 NOTE — Progress Notes (Signed)
Occupational Therapy Treatment ?Patient Details ?Name: April Larsen ?MRN: [id number] ?DOB: Nov 22, 1945 ?Today's Date: 06/19/2021 ? ? ?History of present illness Pt is a 76 y.o. female who presents to the ED after suffering a fall on 3/18. Pt reports she was moving too quickly while using her cane and lost her footnig and hit the back of her head. All imaging negative; currently admitted due to vertigo w/ recurrent nausea/vomiting. PmHx: Anxiety, Arthritis, Skin Cancer, Anemia, Migraine Headaches, Chronic Urinary Incontinence. ?  ?OT comments ? Chart reviewed to date, pt agreeable to OT tx session. Tx session targeted progressing functional mobility and activity tolerance to improve independence and safety in ADL/mobility completion. BP orthostatics taken on this date with pt reporting dizziness throughout mobility. Progress noted as pt is able to amb approx 7 feet with RW with CGA. SET UP required for UB dressing and grooming tasks in seated. Education provided to pt re: compensatory strategies for current limitations. Pt is left in bedside chair, NAD, all needs met. OT will continue to follow.   ? ?Recommendations for follow up therapy are one component of a multi-disciplinary discharge planning process, led by the attending physician.  Recommendations may be updated based on patient status, additional functional criteria and insurance authorization. ?   ?Follow Up Recommendations ? Skilled nursing-short term rehab (<3 hours/day)  ?  ?Assistance Recommended at Discharge Intermittent Supervision/Assistance  ?Patient can return home with the following ? A lot of help with bathing/dressing/bathroom;A lot of help with walking and/or transfers;Assistance with cooking/housework;Help with stairs or ramp for entrance;Assist for transportation ?  ?Equipment Recommendations ? None recommended by OT  ?  ?Recommendations for Other Services   ? ?  ?Precautions / Restrictions Precautions ?Precautions: Fall ?Precaution Comments:  recent spine fusion (12/22) ?Restrictions ?Weight Bearing Restrictions: No  ? ? ?  ? ?Mobility Bed Mobility ?  ?  ?  ?  ?  ?  ?  ?  ?  ? ?Transfers ?  ?  ?  ?  ?  ?  ?  ?  ?  ?  ?  ?  ?Balance Overall balance assessment: Needs assistance ?Sitting-balance support: Feet supported, Bilateral upper extremity supported ?Sitting balance-Leahy Scale: Good ?  ?  ?Standing balance support: During functional activity, Reliant on assistive device for balance ?Standing balance-Leahy Scale: Fair ?  ?  ?  ?  ?  ?  ?  ?  ?  ?  ?  ?  ?   ? ?ADL either performed or assessed with clinical judgement  ? ?ADL Overall ADL's : Needs assistance/impaired ?  ?  ?  ?  ?  ?  ?  ?  ?  ?  ?  ?  ?  ?  ?  ?  ?  ?  ?  ?General ADL Comments: sup supine>sit, Supv for STS with RW, amb approx 7 feet with RW with CGA; SET UP for UB dressing and grooming tasks in seated; pt with dizziness throughout, orthostaics taken and BP WNL ?  ? ?Extremity/Trunk Assessment   ?  ?  ?  ?  ?  ? ?Vision Patient Visual Report: Nausea/blurring vision with head movement;Other (comment) (nystagmus) ?  ?  ?Perception   ?  ?Praxis   ?  ? ?Cognition Arousal/Alertness: Awake/alert ?Behavior During Therapy: [facility] for tasks assessed/performed ?Overall Cognitive Status: Within Functional Limits for tasks assessed ?  ?  ?  ?  ?  ?  ?  ?  ?  ?  ?  ?  ?  ?  ?  ?  ?  ?  ?  ?   ?  Exercises Other Exercises ?Other Exercises: edu re: importance of oob mobility, compensatory strategies for current deficits ? ?  ?Shoulder Instructions   ? ? ?  ?General Comments BP supine: 131/79, BP in sitting: 140/91, BP in standing 147/105, BP standing 3 minutes 145/94, BP seated in chair 155/85; HR up to 118; symptoms improved when seated in chair with legs up  ? ? ?Pertinent Vitals/ Pain       Pain Assessment ?Pain Assessment: No/denies pain ? ?Home Living   ?  ?  ?  ?  ?  ?  ?  ?  ?  ?  ?  ?  ?  ?  ?  ?  ?  ?  ? ?  ?Prior Functioning/Environment    ?  ?  ?  ?   ? ?Frequency ? Min 4X/week  ? ? ? ? ?   ?Progress Toward Goals ? ?OT Goals(current goals can now be found in the care plan section) ? Progress towards OT goals: Progressing toward goals ? ?Acute Rehab OT Goals ?Patient Stated Goal: feel better ?OT Goal Formulation: With patient ?Time For Goal Achievement: 07/03/21 ?Potential to Achieve Goals: Good  ?Plan Discharge plan remains appropriate   ? ?Co-evaluation ? ? ?   ?  ?  ?  ?  ? ?  ?AM-PAC OT "6 Clicks" Daily Activity     ?Outcome Measure ? ? Help from another person eating meals?: None ?Help from another person taking care of personal grooming?: None ?Help from another person toileting, which includes using toliet, bedpan, or urinal?: A Little ?Help from another person bathing (including washing, rinsing, drying)?: A Lot ?Help from another person to put on and taking off regular upper body clothing?: A Little ?Help from another person to put on and taking off regular lower body clothing?: A Lot ?6 Click Score: 18 ? ?  ?End of Session Equipment Utilized During Treatment: Rolling walker (2 wheels);Gait belt ? ?OT Visit Diagnosis: Other abnormalities of gait and mobility (R26.89);BPPV;Muscle weakness (generalized) (M62.81);History of falling (Z91.81) ?  ?Activity Tolerance Patient tolerated treatment well ?  ?Patient Left in chair;with chair alarm set;with call bell/phone within reach;with family/visitor present ?  ?Nurse Communication Mobility status ?  ? ?   ? ?Time: 1103-1594 ?OT Time Calculation (min): 33 min ? ?Charges: OT General Charges ?$OT Visit: 1 Visit ?OT Treatments ?$Self Care/Home Management : 8-22 mins ?$Therapeutic Activity: 8-22 mins ? ?Shanon Payor, OTD OTR/L  ?06/19/21, 3:53 PM  ?

## 2021-06-19 NOTE — Progress Notes (Signed)
Neurology Progress Note ? ? ?S:// ?Seen and examined ?Dizziness still not improved. ?Headache much improved ? ? ?O:// ?Current vital signs: ?BP 136/76 (BP Location: Right Arm)   Pulse (!) 107   Temp 97.6 ?F (36.4 ?C)   Resp 18   SpO2 96%  ?Vital signs in last 24 hours: ?Temp:  [97.6 ?F (36.4 ?C)-98.2 ?F (36.8 ?C)] 97.6 ?F (36.4 ?C) (03/23 6948) ?Pulse Rate:  [106-110] 107 (03/23 0807) ?Resp:  [15-20] 18 (03/23 5462) ?BP: (136-150)/(70-88) 136/76 (03/23 7035) ?SpO2:  [95 %-99 %] 96 % (03/23 0807) ?General: WD WN NAD ?HEENt: Swelling on the occipital region bilaterally ?CVS: Regular rhythm ?Respiratory breathing well saturating normally on room air ?Extremity warm well perfused ?Abdomen nondistended nontender ?Neurological exam ?Awake alert oriented x3.  No dysarthria.  No aphasia.  Cranial nerves II to XII intact with questionable 2-3 beat and gaze nystagmus bilaterally.  Motor examination with symmetric 5/5 strength in the upper extremities and 4+/5 strength with some restricted movement due to pain in bilateral lower extremities.  Sensation intact to light touch without extinction.  Brisk reflexes in the lower extremities with ankle clonus which is sustained on the left and 4-5 beats on the right.   ?Exam remains unchanged ? ?Medications ? ?Current Facility-Administered Medications:  ?  acetaminophen (TYLENOL) tablet 650 mg, 650 mg, Oral, Q6H PRN, 650 mg at 06/18/21 1700 **OR** acetaminophen (TYLENOL) suppository 650 mg, 650 mg, Rectal, Q6H PRN, Foust, Katy L, NP ?  carvedilol (COREG) tablet 6.25 mg, 6.25 mg, Oral, BID WC, Sreenath, Sudheer B, MD ?  diazepam (VALIUM) tablet 2 mg, 2 mg, Oral, Q4H PRN, Sreenath, Sudheer B, MD ?  enoxaparin (LOVENOX) injection 40 mg, 40 mg, Subcutaneous, Q24H, Judd Gaudier V, MD, 40 mg at 06/19/21 0813 ?  meclizine (ANTIVERT) tablet 25 mg, 25 mg, Oral, TID PRN, Amie Portland, MD, 25 mg at 06/19/21 0093 ?  metoCLOPramide (REGLAN) injection 10 mg, 10 mg, Intravenous, Q8H, Sreenath,  Sudheer B, MD, 10 mg at 06/19/21 0856 ?Labs ?CBC ?   ?Component Value Date/Time  ? WBC 10.3 06/19/2021 0750  ? RBC 4.45 06/19/2021 0750  ? HGB 12.4 06/19/2021 0750  ? HGB 15.5 12/03/2020 1150  ? HCT 37.7 06/19/2021 0750  ? HCT 45.2 12/03/2020 1150  ? PLT 377 06/19/2021 0750  ? PLT 412 12/03/2020 1150  ? MCV 84.7 06/19/2021 0750  ? MCV 93 12/03/2020 1150  ? MCV 88 05/18/2013 0741  ? MCH 27.9 06/19/2021 0750  ? MCHC 32.9 06/19/2021 0750  ? RDW 14.3 06/19/2021 0750  ? RDW 12.5 12/03/2020 1150  ? RDW 14.0 05/18/2013 0741  ? LYMPHSABS 1.3 06/19/2021 0750  ? LYMPHSABS 2.0 05/18/2013 0741  ? MONOABS 1.2 (H) 06/19/2021 0750  ? MONOABS 1.2 (H) 05/18/2013 0741  ? EOSABS 0.3 06/19/2021 0750  ? EOSABS 0.1 05/18/2013 0741  ? BASOSABS 0.1 06/19/2021 0750  ? BASOSABS 0.1 05/18/2013 0741  ? ? ?CMP  ?   ?Component Value Date/Time  ? NA 142 06/19/2021 0750  ? NA 135 12/03/2020 1150  ? NA 140 03/29/2012 1120  ? K 3.0 (L) 06/19/2021 0750  ? K 3.7 03/29/2012 1120  ? CL 107 06/19/2021 0750  ? CL 110 (H) 03/29/2012 1120  ? CO2 26 06/19/2021 0750  ? CO2 20 (L) 03/29/2012 1120  ? GLUCOSE 102 (H) 06/19/2021 0750  ? GLUCOSE 70 03/29/2012 1120  ? BUN 17 06/19/2021 0750  ? BUN 22 12/03/2020 1150  ? BUN 17 03/29/2012 1120  ?  CREATININE 0.66 06/19/2021 0750  ? CREATININE 0.74 03/29/2012 1120  ? CALCIUM 9.2 06/19/2021 0750  ? CALCIUM 8.9 03/29/2012 1120  ? PROT 7.2 06/15/2021 2154  ? PROT 7.2 12/03/2020 1150  ? PROT 7.5 03/29/2012 1120  ? ALBUMIN 3.5 06/15/2021 2154  ? ALBUMIN 4.6 12/03/2020 1150  ? ALBUMIN 4.1 03/29/2012 1120  ? AST 25 06/15/2021 2154  ? AST 25 03/29/2012 1120  ? ALT 16 06/15/2021 2154  ? ALT 29 03/29/2012 1120  ? ALKPHOS 108 06/15/2021 2154  ? ALKPHOS 124 03/29/2012 1120  ? BILITOT 0.8 06/15/2021 2154  ? BILITOT 0.6 12/03/2020 1150  ? BILITOT 0.3 03/29/2012 1120  ? GFRNONAA >60 06/19/2021 0750  ? GFRNONAA >60 03/29/2012 1120  ? GFRAA >60 01/14/2019 0106  ? GFRAA >60 03/29/2012 1120  ? ? ?Imaging ?I have reviewed images in epic  and the results pertinent to this consultation are: ?CT-head and neck 06/14/2021 ?IMPRESSION: ?No acute intracranial abnormality. No calvarial fracture. Small ?right parietal scalp hematoma. ?  ?No acute fracture or traumatic listhesis of the cervical spine. ?  ?Multilevel degenerative disc and degenerative joint disease, as ?described above, with resultant multilevel moderate to severe ?neuroforaminal narrowing and moderate central canal stenosis at ?C3-4. ?  ?CT head 06/16/2021 ?IMPRESSION: ?No evidence of acute intracranial abnormality. Mild small vessel ?ischemic changes. ?  ?No interval change from recent CT. ?  ?MRI examination of the brain 06/16/2021 ?IMPRESSION: ?1. No acute intracranial abnormality with essentially normal for age ?noncontrast MRI appearance of the brain. ?2. Extensive bilateral posterior scalp hematoma. ? ?MRI C-spine 06/16/2021 ?IMPRESSION: ?No acute or traumatic finding in the cervical region. ?Chronic degenerative spondylosis as outlined above. Mild narrowing ?of the canal from C3-4 through C6-7, but no compressive canal ?stenosis. Foraminal narrowing due to osteophytic encroachment with ?some potential for relevant foraminal stenosis on the left at C3-4, ?on the left at C4-5, bilateral at C5-6 and on the right at C6-7. No ?change is appreciated from the recent CT or MRI. ?   ?Assessment:  ?76 year old with complaints of intractable headache that started after she sustained a fall and hit her back of the head to the ground.  She has a swelling around the midline on the back of her head along with bruising.  Likely headaches from the swelling and the bruise as well as possible irritation of the occipital nerves causing occipital neuralgia as well as cervicalgia as she also complains of neck pain.  Has degenerative cervical spine-unchanged on MRI from before. ?At this time, her headache has a multifactorial etiology-postconcussive along with occipital neuralgia and possible component of  cervicalgia-also has history of migraines which predisposes her to headaches. She is intolerant to NSAIDs due to previous colon surgery. ?IV fluids, 1 dose of Solu-Medrol, and Compazine have been helpful in reducing the headache from a 10 out of 10 to about 2-3 out of 10.  Headache has remained under good control. ?Continues to complain of dizziness/vertiginous symptoms. PT performed Dix-Hallpike and she seems to have b/l BPPV with minimal relief after Epley maneuver. ? ?Recommendations: ?-Discontinue tramadol ?-Due to continuing nausea, adding Reglan standing doses for now in addition to the meclizine as needed. ?-Continue low-dose diazepam but increase frequency from every 6 hours to every 4 hours. ?-Additional 1 L bolus normal saline ?-Appreciate continuing PT assistance and the diagnosis and management of the BPPV. ? ?Plan d/w patient, daughter and Dr Priscella Mann ?-- ?Amie Portland, MD ?Neurologist ?[facility] ?Pager: [phone] ?

## 2021-06-19 NOTE — Progress Notes (Signed)
Physical Therapy Treatment ?Patient Details ?Name: April Larsen ?MRN: [id number] ?DOB: 17-Feb-1946 ?Today's Date: 06/19/2021 ? ? ?History of Present Illness Pt is a 76 y.o. female who presents to the ED after suffering a fall on 3/18. Pt reports she was moving too quickly while using her cane and lost her footnig and hit the back of her head. All imaging negative; currently admitted due to vertigo w/ recurrent nausea/vomiting. PmHx: Anxiety, Arthritis, Skin Cancer, Anemia, Migraine Headaches, Chronic Urinary Incontinence. ? ?  ?PT Comments  ? ? Pt awake and alert resting in bed w/ daughter at bedside. Pt was able to perform supine to sit bed mobility w/ SUPERVISION and HOB elevated to sit EOB. She was able to progress to standing w/ CGA using RW and progress to taking ~10 steps fwd/bkwds x2 before increased c/o nausea and requesting to sit back down. The rest of today's session focused on vestibular treatment; specifically performance of L epley. L dixhall pike and epley performed x3 on the L with decrease in intensity and duration of nystagmus following each attempt. Pt in bed, will all needs w/in reach prior to exiting room. Current discharge recommendation remains appropriate due to the level of assistance required by the patient to ensure safety and improve overall function. ? ? ? ?  ?Recommendations for follow up therapy are one component of a multi-disciplinary discharge planning process, led by the attending physician.  Recommendations may be updated based on patient status, additional functional criteria and insurance authorization. ? ?Follow Up Recommendations ? Skilled nursing-short term rehab (<3 hours/day) ?  ?  ?Assistance Recommended at Discharge Frequent or constant Supervision/Assistance  ?Patient can return home with the following A little help with walking and/or transfers;A little help with bathing/dressing/bathroom;Assist for transportation;Help with stairs or ramp for entrance;Assistance with  cooking/housework ?  ?Equipment Recommendations ? Rolling walker (2 wheels);BSC/3in1  ?  ?Recommendations for Other Services   ? ? ?  ?Precautions / Restrictions Precautions ?Precautions: Fall ?Precaution Comments: recent spine fusion (12/22) ?Restrictions ?Weight Bearing Restrictions: No  ?  ? ?Mobility ? Bed Mobility ?Overal bed mobility: Needs Assistance ?Bed Mobility: Supine to Sit, Sit to Supine ?Rolling: Min assist, +2 for safety/equipment ?Sidelying to sit: Min assist, +2 for safety/equipment ?Supine to sit: Supervision, HOB elevated ?Sit to supine: Min assist ?  ?  ?  ? ?Transfers ?Overall transfer level: Needs assistance ?Equipment used: Rolling walker (2 wheels) ?Transfers: Sit to/from Stand ?Sit to Stand: Min guard ?  ?  ?  ?  ?  ?  ?  ? ?Ambulation/Gait ?Ambulation/Gait assistance: Min guard ?Gait Distance (Feet): 6 Feet ?Assistive device: Rolling walker (2 wheels) ?Gait Pattern/deviations: Step-to pattern, Decreased step length - right, Decreased step length - left, Decreased stride length ?  ?  ?  ?General Gait Details: pt able to take several steps forward/backward, twice. limited by nausea and asking to sit back down ? ? ?Stairs ?  ?  ?  ?  ?  ? ? ?Wheelchair Mobility ?  ? ?Modified Rankin (Stroke Patients Only) ?  ? ? ?  ?Balance Overall balance assessment: Needs assistance ?Sitting-balance support: Feet supported, Bilateral upper extremity supported ?Sitting balance-Leahy Scale: Fair ?Sitting balance - Comments: heavy reliance of BUE support on EOB during static sitting. ?  ?Standing balance support: During functional activity, Reliant on assistive device for balance ?Standing balance-Leahy Scale: Fair ?  ?  ?  ?  ?  ?  ?  ?  ?  ?  ?  ?  ?  ? ?  ?  Cognition Arousal/Alertness: Awake/alert ?Behavior During Therapy: [facility] for tasks assessed/performed ?Overall Cognitive Status: Within Functional Limits for tasks assessed ?  ?  ?  ?  ?  ?  ?  ?  ?  ?  ?  ?  ?  ?  ?  ?  ?  ?  ?  ? ?  ?Exercises   ? ?   ?General Comments   ?  ?  ? ?Pertinent Vitals/Pain Pain Assessment ?Pain Assessment: No/denies pain  ? ? ?Home Living   ?  ?  ?  ?  ?  ?  ?  ?  ?  ?   ?  ?Prior Function    ?  ?  ?   ? ?PT Goals (current goals can now be found in the care plan section) Progress towards PT goals: Progressing toward goals ? ?  ?Frequency ? ? ? Min 2X/week ? ? ? ?  ?PT Plan Current plan remains appropriate  ? ? ?Co-evaluation   ?  ?  ?  ?  ? ?  ?AM-PAC PT "6 Clicks" Mobility   ?Outcome Measure ? Help needed turning from your back to your side while in a flat bed without using bedrails?: A Little ?Help needed moving from lying on your back to sitting on the side of a flat bed without using bedrails?: A Little ?Help needed moving to and from a bed to a chair (including a wheelchair)?: A Little ?Help needed standing up from a chair using your arms (e.g., wheelchair or bedside chair)?: A Little ?Help needed to walk in hospital room?: A Lot ?Help needed climbing 3-5 steps with a railing? : A Lot ?6 Click Score: 16 ? ?  ?End of Session   ?Activity Tolerance: Other (comment) (limited due to nausea/dizziness) ?Patient left: in bed;with call bell/phone within reach;with bed alarm set;with family/visitor present ?Nurse Communication: Mobility status ?PT Visit Diagnosis: History of falling (Z91.81);Muscle weakness (generalized) (M62.81);Unsteadiness on feet (R26.81) ?  ? ? ?Time: 2355-7322 ?PT Time Calculation (min) (ACUTE ONLY): 58 min ? ?Charges:             ?          ? ?Jonnie Kind, SPT ?06/19/2021, 12:43 PM ? ?

## 2021-06-20 DIAGNOSIS — F0781 Postconcussional syndrome: Secondary | ICD-10-CM | POA: Diagnosis not present

## 2021-06-20 LAB — BASIC METABOLIC PANEL
Anion gap: 9 (ref 5–15)
BUN: 19 mg/dL (ref 8–23)
CO2: 24 mmol/L (ref 22–32)
Calcium: 8.9 mg/dL (ref 8.9–10.3)
Chloride: 108 mmol/L (ref 98–111)
Creatinine, Ser: 0.57 mg/dL (ref 0.44–1.00)
GFR, Estimated: >60 mL/min (ref >60)
Glucose, Bld: 104 mg/dL — ABNORMAL HIGH (ref 70–99)
Potassium: 2.6 mmol/L — CL (ref 3.5–5.1)
Sodium: 141 mmol/L (ref 135–145)

## 2021-06-20 LAB — POTASSIUM: Potassium: 4 mmol/L (ref 3.5–5.1)

## 2021-06-20 LAB — MAGNESIUM: Magnesium: 1.7 mg/dL (ref 1.7–2.4)

## 2021-06-20 MED ORDER — POTASSIUM CHLORIDE CRYS ER 20 MEQ PO TBCR: 40.0000 meq | EXTENDED_RELEASE_TABLET | Freq: Once | ORAL | Status: DC

## 2021-06-20 MED ORDER — POTASSIUM CHLORIDE CRYS ER 20 MEQ PO TBCR
40.0000 meq | EXTENDED_RELEASE_TABLET | ORAL | Status: AC
  Administered 2021-06-20 (×2): 40 meq via ORAL
  Filled 2021-06-20 (×2): qty 2

## 2021-06-20 MED ORDER — POTASSIUM CHLORIDE CRYS ER 20 MEQ PO TBCR
40.0000 meq | EXTENDED_RELEASE_TABLET | Freq: Once | ORAL | Status: AC
  Administered 2021-06-20: 40 meq via ORAL
  Filled 2021-06-20: qty 2

## 2021-06-20 MED ORDER — POTASSIUM CHLORIDE 10 MEQ/100ML IV SOLN
10.0000 meq | INTRAVENOUS | Status: DC
  Administered 2021-06-20: 10 meq via INTRAVENOUS
  Filled 2021-06-20: qty 100

## 2021-06-20 MED ORDER — DIAZEPAM 2 MG PO TABS
2.0000 mg | ORAL_TABLET | Freq: Three times a day (TID) | ORAL | Status: DC
  Administered 2021-06-20 – 2021-06-23 (×8): 2 mg via ORAL
  Filled 2021-06-20 (×9): qty 1

## 2021-06-20 NOTE — Care Management Important Message (Signed)
Important Message ? ?Patient Details  ?Name: April Larsen ?MRN: [id number] ?Date of Birth: [birth year] ? ? ?Medicare Important Message Given:  Yes ? ? ? ? ?Juliann Pulse A Thales Knipple ?06/20/2021, 2:23 PM ?

## 2021-06-20 NOTE — Progress Notes (Signed)
Neurology Progress Note ? ? ?S:// ?Seen and examined ?Undergoing PT ?Walked up to the bathroom today. ?Better with Epley maneuvers but still has dizziness. ? ?O:// ?Current vital signs: ?BP 131/79 (BP Location: Right Arm)   Pulse 97   Temp 98 ?F (36.7 ?C)   Resp 18   SpO2 94%  ?Vital signs in last 24 hours: ?Temp:  [97.9 ?F (36.6 ?C)-98.7 ?F (37.1 ?C)] 98 ?F (36.7 ?C) (03/24 0800) ?Pulse Rate:  [97-110] 97 (03/24 0800) ?Resp:  [16-18] 18 (03/24 0800) ?BP: (122-156)/(61-79) 131/79 (03/24 0800) ?SpO2:  [93 %-99 %] 94 % (03/24 0800) ?General: WD WN NAD ?HEENT: Swelling on the occipital region bilaterally, somewhat improving ?CVS: Regular rhythm ?Respiratory breathing well saturating normally on room air ?Extremity warm well perfused ?Abdomen nondistended nontender ?Neurological exam ?Awake alert oriented x3.  No dysarthria.  No aphasia.  Cranial nerves II to XII intact with nystagmus on both and gazes which is less prominent than prior exams.  Motor examination with symmetric 5/5 strength in the upper extremities and 4+/5 strength with some restricted movement due to pain in bilateral lower extremities.  Sensation intact to light touch without extinction.  Brisk reflexes in the lower extremities with ankle clonus which is sustained on the left and 4-5 beats on the right.   ? ?Medications ? ?Current Facility-Administered Medications:  ?  acetaminophen (TYLENOL) tablet 650 mg, 650 mg, Oral, Q6H PRN, 650 mg at 06/18/21 1700 **OR** acetaminophen (TYLENOL) suppository 650 mg, 650 mg, Rectal, Q6H PRN, Foust, Katy L, NP ?  buPROPion (WELLBUTRIN XL) 24 hr tablet 150 mg, 150 mg, Oral, Daily, Sreenath, Sudheer B, MD, 150 mg at 06/20/21 1914 ?  carvedilol (COREG) tablet 6.25 mg, 6.25 mg, Oral, BID WC, Sreenath, Sudheer B, MD, 6.25 mg at 06/20/21 7829 ?  colestipol (COLESTID) tablet 1 g, 1 g, Oral, Daily, Sreenath, Sudheer B, MD, 1 g at 06/20/21 5621 ?  diazepam (VALIUM) tablet 2 mg, 2 mg, Oral, Q4H PRN, Priscella Mann, Sudheer B,  MD, 2 mg at 06/20/21 3086 ?  DULoxetine (CYMBALTA) DR capsule 60 mg, 60 mg, Oral, QHS, Sreenath, Sudheer B, MD, 60 mg at 06/19/21 2124 ?  enoxaparin (LOVENOX) injection 40 mg, 40 mg, Subcutaneous, Q24H, Athena Masse, MD, 40 mg at 06/20/21 0804 ?  meclizine (ANTIVERT) tablet 25 mg, 25 mg, Oral, TID PRN, Amie Portland, MD, 25 mg at 06/20/21 0803 ?  metoCLOPramide (REGLAN) injection 10 mg, 10 mg, Intravenous, Q8H, Sreenath, Sudheer B, MD, 10 mg at 06/20/21 0804 ?Labs ?CBC ?   ?Component Value Date/Time  ? WBC 10.3 06/19/2021 0750  ? RBC 4.45 06/19/2021 0750  ? HGB 12.4 06/19/2021 0750  ? HGB 15.5 12/03/2020 1150  ? HCT 37.7 06/19/2021 0750  ? HCT 45.2 12/03/2020 1150  ? PLT 377 06/19/2021 0750  ? PLT 412 12/03/2020 1150  ? MCV 84.7 06/19/2021 0750  ? MCV 93 12/03/2020 1150  ? MCV 88 05/18/2013 0741  ? MCH 27.9 06/19/2021 0750  ? MCHC 32.9 06/19/2021 0750  ? RDW 14.3 06/19/2021 0750  ? RDW 12.5 12/03/2020 1150  ? RDW 14.0 05/18/2013 0741  ? LYMPHSABS 1.3 06/19/2021 0750  ? LYMPHSABS 2.0 05/18/2013 0741  ? MONOABS 1.2 (H) 06/19/2021 0750  ? MONOABS 1.2 (H) 05/18/2013 0741  ? EOSABS 0.3 06/19/2021 0750  ? EOSABS 0.1 05/18/2013 0741  ? BASOSABS 0.1 06/19/2021 0750  ? BASOSABS 0.1 05/18/2013 0741  ? ? ?CMP  ?   ?Component Value Date/Time  ? NA 141 06/20/2021  0327  ? NA 135 12/03/2020 1150  ? NA 140 03/29/2012 1120  ? K 2.6 (LL) 06/20/2021 0327  ? K 3.7 03/29/2012 1120  ? CL 108 06/20/2021 0327  ? CL 110 (H) 03/29/2012 1120  ? CO2 24 06/20/2021 0327  ? CO2 20 (L) 03/29/2012 1120  ? GLUCOSE 104 (H) 06/20/2021 0327  ? GLUCOSE 70 03/29/2012 1120  ? BUN 19 06/20/2021 0327  ? BUN 22 12/03/2020 1150  ? BUN 17 03/29/2012 1120  ? CREATININE 0.57 06/20/2021 0327  ? CREATININE 0.74 03/29/2012 1120  ? CALCIUM 8.9 06/20/2021 0327  ? CALCIUM 8.9 03/29/2012 1120  ? PROT 7.2 06/15/2021 2154  ? PROT 7.2 12/03/2020 1150  ? PROT 7.5 03/29/2012 1120  ? ALBUMIN 3.5 06/15/2021 2154  ? ALBUMIN 4.6 12/03/2020 1150  ? ALBUMIN 4.1 03/29/2012 1120   ? AST 25 06/15/2021 2154  ? AST 25 03/29/2012 1120  ? ALT 16 06/15/2021 2154  ? ALT 29 03/29/2012 1120  ? ALKPHOS 108 06/15/2021 2154  ? ALKPHOS 124 03/29/2012 1120  ? BILITOT 0.8 06/15/2021 2154  ? BILITOT 0.6 12/03/2020 1150  ? BILITOT 0.3 03/29/2012 1120  ? GFRNONAA >60 06/20/2021 0327  ? GFRNONAA >60 03/29/2012 1120  ? GFRAA >60 01/14/2019 0106  ? GFRAA >60 03/29/2012 1120  ? ? ?Imaging ?I have reviewed images in epic and the results pertinent to this consultation are: ?CT-head and neck 06/14/2021 ?IMPRESSION: ?No acute intracranial abnormality. No calvarial fracture. Small ?right parietal scalp hematoma. ?  ?No acute fracture or traumatic listhesis of the cervical spine. ?  ?Multilevel degenerative disc and degenerative joint disease, as ?described above, with resultant multilevel moderate to severe ?neuroforaminal narrowing and moderate central canal stenosis at ?C3-4. ?  ?CT head 06/16/2021 ?IMPRESSION: ?No evidence of acute intracranial abnormality. Mild small vessel ?ischemic changes. ?  ?No interval change from recent CT. ?  ?MRI examination of the brain 06/16/2021 ?IMPRESSION: ?1. No acute intracranial abnormality with essentially normal for age ?noncontrast MRI appearance of the brain. ?2. Extensive bilateral posterior scalp hematoma. ? ?MRI C-spine 06/16/2021 ?IMPRESSION: ?No acute or traumatic finding in the cervical region. ?Chronic degenerative spondylosis as outlined above. Mild narrowing ?of the canal from C3-4 through C6-7, but no compressive canal ?stenosis. Foraminal narrowing due to osteophytic encroachment with ?some potential for relevant foraminal stenosis on the left at C3-4, ?on the left at C4-5, bilateral at C5-6 and on the right at C6-7. No ?change is appreciated from the recent CT or MRI. ?   ?Assessment:  ?76 year old with complaints of intractable headache that started after she sustained a fall and hit her back of the head to the ground.  She has a swelling around the midline on the  back of her head along with bruising.  Likely headaches from the swelling and the bruise as well as possible irritation of the occipital nerves causing occipital neuralgia as well as cervicalgia as she also complains of neck pain.  Has degenerative cervical spine-unchanged on MRI from before. ?At this time, her headache has a multifactorial etiology-postconcussive along with occipital neuralgia and possible component of cervicalgia-also has history of migraines which predisposes her to headaches. She is intolerant to NSAIDs due to previous colon surgery. ?IV fluids, 1 dose of Solu-Medrol, and Compazine have been helpful in reducing the headache from a 10 out of 10 to about 2-3 out of 10.  Headache has remained under good control. ?Continues to complain of dizziness/vertiginous symptoms. PT performed Dix-Hallpike and she seems to have  bilateral BPPV . ?Medication adjustments have been made-meclizine and diazepam have been added and she is continuing to get vestibular rehab. ?Today she is much better compared to the prior few days. ? ?Impression ?Postconcussive headache ?BPPV ?Occipital neuralgia ? ?Recommendations: ?-Continue low-dose diazepam every 4 hours as needed. ?-Continue meclizine ?-Management of hypokalemia per primary team ?-Appreciate PT evaluation and treatment of BPPV. ?-Will need continued outpatient vestibular rehab ?-Discussed with the daughter at bedside. ?Plan discussed with Dr. Priscella Mann ?-- ?Amie Portland, MD ?Neurologist ?[facility] ?Pager: [phone] ?

## 2021-06-20 NOTE — Progress Notes (Signed)
Occupational Therapy Treatment ?Patient Details ?Name: April Larsen ?MRN: [id number] ?DOB: Sep 01, 1945 ?Today's Date: 06/20/2021 ? ? ?History of present illness Pt is a 76 y.o. female who presents to the ED after suffering a fall on 3/18. Pt reports she was moving too quickly while using her cane and lost her footnig and hit the back of her head. All imaging negative; currently admitted due to vertigo w/ recurrent nausea/vomiting. PmHx: Anxiety, Arthritis, Skin Cancer, Anemia, Migraine Headaches, Chronic Urinary Incontinence. ?  ?OT comments ? Chart reviewed, pt greeted in bed agreeable to OT tx session. Pt with decreased reported dizziness, reporting dizziness feels "mild" and unchanged with position changes. Tx session targeted progressing functional mobility and independence/safety during ADL task completion. Improvements with ADL tasks as evidenced by completing grooming/bathing tasks in standing, functional mobility within room with supervision-SET UP. Pt is left in bedside chair, NAD, all needs met. OT will continue to follow acutely. Continue to recommend STR however pt could potentially discharge home with family assist pending continued progress and due to functional performance on this date. OT will continue to follow.   ? ?Recommendations for follow up therapy are one component of a multi-disciplinary discharge planning process, led by the attending physician.  Recommendations may be updated based on patient status, additional functional criteria and insurance authorization. ?   ?Follow Up Recommendations ? Skilled nursing-short term rehab (<3 hours/day)  ?  ?Assistance Recommended at Discharge Intermittent Supervision/Assistance  ?Patient can return home with the following ? Assistance with cooking/housework;Help with stairs or ramp for entrance;Assist for transportation;A little help with walking and/or transfers ?  ?Equipment Recommendations ? Tub/shower seat  ?  ?Recommendations for Other Services   ? ?   ?Precautions / Restrictions Precautions ?Precautions: Fall ?Precaution Comments: recent spine fusion (12/22) ?Restrictions ?Weight Bearing Restrictions: No  ? ? ?  ? ?Mobility Bed Mobility ?  ?  ?  ?  ?  ?  ?  ?  ?  ? ?Transfers ?  ?  ?  ?  ?  ?  ?  ?  ?  ?  ?  ?  ?Balance   ?  ?  ?  ?  ?  ?  ?  ?  ?  ?  ?  ?  ?  ?  ?  ?  ?  ?  ?   ? ?ADL either performed or assessed with clinical judgement  ? ?ADL Overall ADL's : Needs assistance/impaired ?  ?  ?  ?  ?  ?  ?  ?  ?  ?  ?  ?  ?  ?  ?  ?  ?  ?  ?  ?General ADL Comments: supine>sit with MOD I, STS with supervision, amb to bathroom sink with RW with supervision, grooming, UB bathing/dressing tasks in standing with SET UP, LB dressing in STS with SET UP. Minimal dizziness reported, unchanged with position changes. ?  ? ?Extremity/Trunk Assessment   ?  ?  ?  ?  ?  ? ?Vision   ?  ?  ?Perception   ?  ?Praxis   ?  ? ?Cognition Arousal/Alertness: Awake/alert ?Behavior During Therapy: [facility] for tasks assessed/performed ?Overall Cognitive Status: Within Functional Limits for tasks assessed ?  ?  ?  ?  ?  ?  ?  ?  ?  ?  ?  ?  ?  ?  ?  ?  ?  ?  ?  ?   ?Exercises Other  Exercises ?Other Exercises: edu re: home safety, use of bsc and shower chair, compensatory strategies for deficits ? ?  ?Shoulder Instructions   ? ? ?  ?General Comments    ? ? ?Pertinent Vitals/ Pain       Pain Assessment ?Pain Score: 6  ?Pain Location: L shoulder ?Pain Descriptors / Indicators: Aching ?Pain Intervention(s): Limited activity within patient's tolerance, Monitored during session, Repositioned, Other (comment) (pt report she received meds) ? ?Home Living   ?  ?  ?  ?  ?  ?  ?  ?  ?  ?  ?  ?  ?  ?  ?  ?  ?  ?  ? ?  ?Prior Functioning/Environment    ?  ?  ?  ?   ? ?Frequency ? Min 4X/week  ? ? ? ? ?  ?Progress Toward Goals ? ?OT Goals(current goals can now be found in the care plan section) ? Progress towards OT goals: Progressing toward goals ? ?Acute Rehab OT Goals ?Patient Stated Goal: feel better ?OT  Goal Formulation: With patient ?Time For Goal Achievement: 07/04/21 ?Potential to Achieve Goals: Good  ?Plan Discharge plan remains appropriate   ? ?Co-evaluation ? ? ?   ?  ?  ?  ?  ? ?  ?AM-PAC OT "6 Clicks" Daily Activity     ?Outcome Measure ? ? Help from another person eating meals?: None ?Help from another person taking care of personal grooming?: None ?Help from another person toileting, which includes using toliet, bedpan, or urinal?: None ?Help from another person bathing (including washing, rinsing, drying)?: A Little ?Help from another person to put on and taking off regular upper body clothing?: A Little ?Help from another person to put on and taking off regular lower body clothing?: A Little ?6 Click Score: 21 ? ?  ?End of Session Equipment Utilized During Treatment: Rolling walker (2 wheels);Gait belt ? ?OT Visit Diagnosis: Other abnormalities of gait and mobility (R26.89);BPPV;Muscle weakness (generalized) (M62.81);History of falling (Z91.81) ?  ?Activity Tolerance Patient tolerated treatment well ?  ?Patient Left with chair alarm set;with call bell/phone within reach;with family/visitor present ?  ?Nurse Communication Mobility status ?  ? ?   ? ?Time: 6387-5643 ?OT Time Calculation (min): 29 min ? ?Charges: OT General Charges ?$OT Visit: 1 Visit ?OT Treatments ?$Self Care/Home Management : 23-37 mins ? ?Shanon Payor, OTD OTR/L  ?06/20/21, 1:19 PM  ?

## 2021-06-20 NOTE — Progress Notes (Addendum)
Physical Therapy Treatment ?Patient Details ?Name: April Larsen ?MRN: [id number] ?DOB: 1945/11/25 ?Today's Date: 06/20/2021 ? ? ?History of Present Illness Pt is a 76 y.o. female who presents to the ED after suffering a fall on 3/18. Pt reports she was moving too quickly while using her cane and lost her footnig and hit the back of her head. All imaging negative; currently admitted due to vertigo w/ recurrent nausea/vomiting. PmHx: Anxiety, Arthritis, Skin Cancer, Anemia, Migraine Headaches, Chronic Urinary Incontinence. ? ?  ?PT Comments  ? ? Pt awake and alert resting in bed w/ daughter at bedside. Pt was able to perform supine to sit bed mobility w/ SUPERVISION and HOB elevated to sit EOB. She was able to progress to standing w/ CGA using RW and progress to ambulating ~85f to the door and back to bed. The rest of today's session focused on vestibular treatment; specifically performance of R epley. L & R dixhall pike were performed to assess intensity and duration of nystagmus prior to continuing w/ treatment.  ? ?Minimal to null nystagmus noted w/ L dixhall pike; notable nystagmus still w/ R dixhall pike. As a result x3 performance of epley on the R with decrease in intensity and duration of nystagmus following each attempt, w/ minimal nystagmus noted during 3rd attempt. Pt in bed, will all needs w/in reach prior to exiting room. Current discharge recommendation remains appropriate due to the level of assistance required by the patient to ensure safety and improve overall function. ?  ?Recommendations for follow up therapy are one component of a multi-disciplinary discharge planning process, led by the attending physician.  Recommendations may be updated based on patient status, additional functional criteria and insurance authorization. ? ?Follow Up Recommendations ? Skilled nursing-short term rehab (<3 hours/day) ?  ?  ?Assistance Recommended at Discharge Frequent or constant Supervision/Assistance  ?Patient  can return home with the following A little help with walking and/or transfers;A little help with bathing/dressing/bathroom;Assist for transportation;Help with stairs or ramp for entrance;Assistance with cooking/housework ?  ?Equipment Recommendations ? Rolling walker (2 wheels);BSC/3in1  ?  ?Recommendations for Other Services   ? ? ?  ?Precautions / Restrictions Precautions ?Precautions: Fall ?Precaution Comments: recent spine fusion (12/22) ?Restrictions ?Weight Bearing Restrictions: No  ?  ? ?Mobility ? Bed Mobility ?Overal bed mobility: Needs Assistance ?Bed Mobility: Supine to Sit, Sit to Supine ?Rolling: Min assist, +2 for safety/equipment ?Sidelying to sit: Min assist, +2 for safety/equipment ?Supine to sit: Supervision, HOB elevated ?Sit to supine: Min assist ?  ?  ?  ? ?Transfers ?Overall transfer level: Needs assistance ?Equipment used: Rolling walker (2 wheels) ?Transfers: Sit to/from Stand ?Sit to Stand: Min guard ?  ?  ?  ?  ?  ?  ?  ? ?Ambulation/Gait ?Ambulation/Gait assistance: Min guard ?Gait Distance (Feet): 15 Feet ?Assistive device: Rolling walker (2 wheels) ?Gait Pattern/deviations: Step-to pattern, Decreased step length - right, Decreased step length - left, Decreased stride length ?Gait velocity: decreased ?  ?  ?  ? ? ?Stairs ?  ?  ?  ?  ?  ? ? ?Wheelchair Mobility ?  ? ?Modified Rankin (Stroke Patients Only) ?  ? ? ?  ?Balance Overall balance assessment: Needs assistance ?Sitting-balance support: Feet supported, Bilateral upper extremity supported ?Sitting balance-Leahy Scale: Good ?  ?  ?Standing balance support: During functional activity, Reliant on assistive device for balance ?Standing balance-Leahy Scale: Fair ?  ?  ?  ?  ?  ?  ?  ?  ?  ?  ?  ?  ?  ? ?  ?  Cognition Arousal/Alertness: Awake/alert ?Behavior During Therapy: [facility] for tasks assessed/performed ?Overall Cognitive Status: Within Functional Limits for tasks assessed ?  ?  ?  ?  ?  ?  ?  ?  ?  ?  ?  ?  ?  ?  ?  ?  ?  ?  ?  ? ?   ?Exercises   ? ?  ?General Comments   ?  ?  ? ?Pertinent Vitals/Pain Pain Assessment ?Pain Assessment: No/denies pain  ? ? ?Home Living   ?  ?  ?  ?  ?  ?  ?  ?  ?  ?   ?  ?Prior Function    ?  ?  ?   ? ?PT Goals (current goals can now be found in the care plan section) Progress towards PT goals: Progressing toward goals ? ?  ?Frequency ? ? ? Min 2X/week ? ? ? ?  ?PT Plan Current plan remains appropriate  ? ? ?Co-evaluation   ?  ?  ?  ?  ? ?  ?AM-PAC PT "6 Clicks" Mobility   ?Outcome Measure ? Help needed turning from your back to your side while in a flat bed without using bedrails?: A Little ?Help needed moving from lying on your back to sitting on the side of a flat bed without using bedrails?: A Little ?Help needed moving to and from a bed to a chair (including a wheelchair)?: A Little ?Help needed standing up from a chair using your arms (e.g., wheelchair or bedside chair)?: A Little ?Help needed to walk in hospital room?: A Lot ?Help needed climbing 3-5 steps with a railing? : A Lot ?6 Click Score: 16 ? ?  ?End of Session   ?Activity Tolerance: Patient tolerated treatment well (limited due to nausea/dizziness) ?Patient left: in bed;with call bell/phone within reach;with bed alarm set;with family/visitor present ?Nurse Communication: Mobility status ?PT Visit Diagnosis: History of falling (Z91.81);Muscle weakness (generalized) (M62.81);Unsteadiness on feet (R26.81) ?  ? ? ?Time: 2482-5003 ?PT Time Calculation (min) (ACUTE ONLY): 47 min ? ?Charges:             ?          ? ? ?Jonnie Kind, SPT ?06/20/2021, 12:58 PM ? ?

## 2021-06-20 NOTE — TOC Progression Note (Signed)
Transition of Care (TOC) - Progression Note  ? ? ?Patient Details  ?Name: April Larsen ?MRN: [id number] ?Date of Birth: Nov 28, 1945 ? ?Transition of Care (TOC) CM/SW Contact  ?Severa Jeremiah A Trevious Rampey, LCSW ?Phone Number: ?06/20/2021, 1:37 PM ? ?Clinical Narrative:    ?CSW spoke with pt's daughter and [facility] re[facility] will not take pt as they do not believe pt can withstand the intensity of the therapy there- per pt's daughter. CSW provided pt's daughter with bed offers. Pt's daughter knows pt will not want to go to [facility] however pt's daughter will review [facility] over the weekend. Per MD pt will be here through the weekend. CSW will reconnect wit pt's daughter on Monday unless dc date changes to earlier.  ? ? ?Expected Discharge Plan: Home/Self Care ?Barriers to Discharge: Continued Medical Work up ? ?Expected Discharge Plan and Services ?Expected Discharge Plan: Home/Self Care ?In-house Referral: Clinical Social Work ?  ?Post Acute Care Choice: NA ?Living arrangements for the past 2 months: [place] ?                ?  ?  ?  ?  ?  ?  ?  ?  ?  ?  ? ? ?Social Determinants of Health (SDOH) Interventions ?  ? ?Readmission Risk Interventions ?   ? View : No data to display.  ?  ?  ?  ? ? ?

## 2021-06-20 NOTE — Progress Notes (Signed)
?PROGRESS NOTE ? ? ? ?RAKIYA KRAWCZYK  JQZ:[id number] DOB: 07/11/1945 DOA: 06/15/2021 ?PCP: Rusty Aus, MD  ? ? ?Brief Narrative:  ?76 y.o. female with medical history significant for HTN, ulcerative colitis, who is s/p spinal surgery in December 2022 for severe acquired scoliosis admitted for persistent vomiting and dizziness and headache following a fall the day prior ?  ?3/20 -neuro consult ?3/21: Compazine, diazepam and pain medicine/muscle relaxant per neurology.  PT, OT eval ?3/22: Dizziness symptoms have persisted.  Presentation is multifactorial.  Case discussed with neurology.  Patient has evidence of BPPV bilaterally.  Limited response to Dix-Hallpike maneuver.  Also with some occipital neuralgia secondary to trauma.  Multimodal approach to symptom management ?3/23: No appreciable change in symptoms ?3/24: Improvement in nystagmus.  Still endorses dizziness ? ? ?Assessment & Plan: ?  ?Principal Problem: ?  Post concussion syndrome ?Active Problems: ?  Vertigo ?  Fall (on) (from) other stairs and steps 06/14/21, subsequent encounter ?  Spinal surgery in prior 3 months for correction of scoliosis ?  Chronic ulcerative colitis, without complications ([place]) ?  History of repair of paraesophageal hernia ?  Essential hypertension ?  Nausea and vomiting ?  Hypokalemia ?* Post concussion syndrome ?Appreciate neurology input ?Still with progressive symptoms of nausea and dizziness ?Headache improved ?Plan: ?Continue scheduled Reglan 10 mg IV 3 times daily ?Schedule Valium 2 mg 3 times daily ?No further IV fluids ?Continue PT evaluations ?  ?Fall (on) (from) other stairs and steps 06/14/21, subsequent encounter ?[address] [address] backwards down steps hitting occipital scalp, second visit to ED in 24 hours ?At baseline, ambulates with cane following spinal surgery for idiopathic scoliosis in December 2022.   ?PT, OT eval ?CIR declined admission ?Patient and family prefer to go home ?We will need improvement in symptoms if this is  the discharge plan ?Tentative plan for skilled nursing facility ?  ?Vertigo ?Patient with intractable  vertigo, unable to tolerate changes in position,following a fall less than 48 hours ago. ?CT head x2 within the past 24 hours showed no acute abnormality ?MRI cervical spine does not show any acute pathology ?MRI brain negative for bleed or CVA ?Suspect BPPV ?Meclizine added 3/22 ?Still with vertigo symptoms ?Continue therapy as tolerated ?  ?Concussion-resolved as of 06/17/2021 ?Complains of headache and vomiting and dizziness with changes of position ?Neurology consult requested ?MRI negative ?  ?Spinal surgery in prior 3 months for correction of scoliosis ?X-rays thoracolumbar spine showing intact hardware.  ?S/p surgery December 2022 (See pic below of pre and postop x-rays offered by daughter ) ?At baseline ambulates with cane ?PT, OT eval while here ?Continue with outpatient PT, OT at discharge ?  ?Hypokalemia ?Monitor and replace as necessary ?  ?Essential hypertension ?Continue Coreg ?  ?History of repair of paraesophageal hernia ?Related to prior severe scoliosis ?  ?Chronic ulcerative colitis, without complications ([place]) ?No acute issues ? ? ? ?DVT prophylaxis: SQ Lovenox ?Code Status: Full ?Family Communication: Daughter at bedside 3/22, 3/23, 3/24 ?Disposition Plan: Status is: Inpatient ?Remains inpatient appropriate because: Still with symptoms of significant dizziness and vertigo associated with nausea ? ? ?Level of care: Med-Surg ? ?Consultants:  ?Neurology ? ?Procedures:  ?None ? ?Antimicrobials: ?None ? ? ?Subjective: ?Patient seen and examined.  Continues to endorse severe dizziness associated with nausea.  Headache improved ? ?Objective: ?Vitals:  ? 06/19/21 1559 06/19/21 2049 06/20/21 0415 06/20/21 0800  ?BP: (!) 156/79 139/73 122/61 131/79  ?Pulse: (!) 110 (!) 107 (!) 103 97  ?  Resp: 18 17 16 18   ?Temp: 98.7 ?F (37.1 ?C) 98.5 ?F (36.9 ?C) 97.9 ?F (36.6 ?C) 98 ?F (36.7 ?C)  ?TempSrc: Oral  Oral    ?SpO2: 99% 98% 93% 94%  ? ? ?Intake/Output Summary (Last 24 hours) at 06/20/2021 1204 ?Last data filed at 06/19/2021 2100 ?Gross per 24 hour  ?Intake --  ?Output 650 ml  ?Net -650 ml  ? ?There were no vitals filed for this visit. ? ?Examination: ? ?General exam: No acute distress ?Respiratory system: Clear to auscultation. Respiratory effort normal. ?Cardiovascular system: S1-S2, RRR, no murmurs, no pedal edema ?Gastrointestinal system: Soft, NT/ND, normal bowel sounds ?Central nervous system: Alert and oriented. No focal neurological deficits.  Bilateral nystagmus 2-3 beats, gait not assessed ?Extremities: 5/5 bilateral upper extremities, 4/5 bilateral lower extremities ?Skin: No rashes, lesions or ulcers ?Psychiatry: Judgement and insight appear normal. Mood & affect appropriate.  ? ? ? ?Data Reviewed: I have personally reviewed following labs and imaging studies ? ?CBC: ?Recent Labs  ?Lab 06/15/21 ?2154 06/18/21 ?0731 06/19/21 ?0750  ?WBC 7.8 10.0 10.3  ?NEUTROABS 6.0 7.5 7.6  ?HGB 12.3 11.6* 12.4  ?HCT 37.4 35.8* 37.7  ?MCV 85.8 86.9 84.7  ?PLT 435* 375 377  ? ?Basic Metabolic Panel: ?Recent Labs  ?Lab 06/15/21 ?2154 06/16/21 ?2156 06/18/21 ?9937 06/19/21 ?0750 06/20/21 ?0327  ?NA 140 140 139 142 141  ?K 2.7* 3.3* 2.7* 3.0* 2.6*  ?CL 105 110 109 107 108  ?CO2 25 23 25 26 24   ?GLUCOSE 123* 164* 97 102* 104*  ?BUN 24* 17 15 17 19   ?CREATININE 0.68 0.67 0.53 0.66 0.57  ?CALCIUM 9.6 9.0 9.0 9.2 8.9  ?MG  --   --  1.7 1.7 1.7  ? ?GFR: ?Estimated Creatinine Clearance: 62.3 mL/min (by C-G formula based on SCr of 0.57 mg/dL). ?Liver Function Tests: ?Recent Labs  ?Lab 06/15/21 ?2154  ?AST 25  ?ALT 16  ?ALKPHOS 108  ?BILITOT 0.8  ?PROT 7.2  ?ALBUMIN 3.5  ? ?Recent Labs  ?Lab 06/15/21 ?2154  ?LIPASE 27  ? ?No results for input(s): AMMONIA in the last 168 hours. ?Coagulation Profile: ?No results for input(s): INR, PROTIME in the last 168 hours. ?Cardiac Enzymes: ?No results for input(s): CKTOTAL, CKMB, CKMBINDEX, TROPONINI  in the last 168 hours. ?BNP (last 3 results) ?No results for input(s): PROBNP in the last 8760 hours. ?HbA1C: ?No results for input(s): HGBA1C in the last 72 hours. ?CBG: ?No results for input(s): GLUCAP in the last 168 hours. ?Lipid Profile: ?No results for input(s): CHOL, HDL, LDLCALC, TRIG, CHOLHDL, LDLDIRECT in the last 72 hours. ?Thyroid Function Tests: ?No results for input(s): TSH, T4TOTAL, FREET4, T3FREE, THYROIDAB in the last 72 hours. ?Anemia Panel: ?No results for input(s): VITAMINB12, FOLATE, FERRITIN, TIBC, IRON, RETICCTPCT in the last 72 hours. ?Sepsis Labs: ?No results for input(s): PROCALCITON, LATICACIDVEN in the last 168 hours. ? ?No results found for this or any previous visit (from the past 240 hour(s)).  ? ? ? ? ? ?Radiology Studies: ?No results found. ? ? ? ? ? ?Scheduled Meds: ? buPROPion  150 mg Oral Daily  ? carvedilol  6.25 mg Oral BID WC  ? colestipol  1 g Oral Daily  ? DULoxetine  60 mg Oral QHS  ? enoxaparin (LOVENOX) injection  40 mg Subcutaneous Q24H  ? metoCLOPramide (REGLAN) injection  10 mg Intravenous Q8H  ? ?Continuous Infusions: ? ? ? ? LOS: 4 days  ? ? ? ? ? ?Sidney Ace, MD ?[facility] ? ? ?  If 7PM-7AM, please contact night-coverage ? ?06/20/2021, 12:04 PM  ? ?

## 2021-06-20 NOTE — Progress Notes (Addendum)
Cross Cover ?Critical potassium 2.6, 40 meq oral and 10 meq IV every hour for 4 hours ordered. Magnesium level requested from blood in lab ?

## 2021-06-21 DIAGNOSIS — F0781 Postconcussional syndrome: Secondary | ICD-10-CM | POA: Diagnosis not present

## 2021-06-21 LAB — CBC WITH DIFFERENTIAL/PLATELET
Abs Immature Granulocytes: 0.03 10*3/uL (ref 0.00–0.07)
Basophils Absolute: 0.1 10*3/uL (ref 0.0–0.1)
Basophils Relative: 1 %
Eosinophils Absolute: 0.4 10*3/uL (ref 0.0–0.5)
Eosinophils Relative: 5 %
HCT: 36 % (ref 36.0–46.0)
Hemoglobin: 11.7 g/dL — ABNORMAL LOW (ref 12.0–15.0)
Immature Granulocytes: 0 %
Lymphocytes Relative: 16 %
Lymphs Abs: 1.3 10*3/uL (ref 0.7–4.0)
MCH: 28.1 pg (ref 26.0–34.0)
MCHC: 32.5 g/dL (ref 30.0–36.0)
MCV: 86.5 fL (ref 80.0–100.0)
Monocytes Absolute: 1.1 10*3/uL — ABNORMAL HIGH (ref 0.1–1.0)
Monocytes Relative: 13 %
Neutro Abs: 5.5 10*3/uL (ref 1.7–7.7)
Neutrophils Relative %: 65 %
Platelets: 344 10*3/uL (ref 150–400)
RBC: 4.16 MIL/uL (ref 3.87–5.11)
RDW: 14.7 % (ref 11.5–15.5)
WBC: 8.4 10*3/uL (ref 4.0–10.5)
nRBC: 0 % (ref 0.0–0.2)

## 2021-06-21 LAB — BASIC METABOLIC PANEL
Anion gap: 6 (ref 5–15)
BUN: 17 mg/dL (ref 8–23)
CO2: 23 mmol/L (ref 22–32)
Calcium: 9 mg/dL (ref 8.9–10.3)
Chloride: 110 mmol/L (ref 98–111)
Creatinine, Ser: 0.65 mg/dL (ref 0.44–1.00)
GFR, Estimated: >60 mL/min (ref >60)
Glucose, Bld: 103 mg/dL — ABNORMAL HIGH (ref 70–99)
Potassium: 3.9 mmol/L (ref 3.5–5.1)
Sodium: 139 mmol/L (ref 135–145)

## 2021-06-21 LAB — MAGNESIUM: Magnesium: 1.9 mg/dL (ref 1.7–2.4)

## 2021-06-21 NOTE — Progress Notes (Signed)
?PROGRESS NOTE ? ? ? ?April Larsen  CBS:[id number] DOB: 02-28-1946 DOA: 06/15/2021 ?PCP: Rusty Aus, MD  ? ? ?Brief Narrative:  ?76 y.o. female with medical history significant for HTN, ulcerative colitis, who is s/p spinal surgery in December 2022 for severe acquired scoliosis admitted for persistent vomiting and dizziness and headache following a fall the day prior ?  ?3/20 -neuro consult ?3/21: Compazine, diazepam and pain medicine/muscle relaxant per neurology.  PT, OT eval ?3/22: Dizziness symptoms have persisted.  Presentation is multifactorial.  Case discussed with neurology.  Patient has evidence of BPPV bilaterally.  Limited response to Dix-Hallpike maneuver.  Also with some occipital neuralgia secondary to trauma.  Multimodal approach to symptom management ?3/23: No appreciable change in symptoms ?3/24: Improvement in nystagmus.  Still endorses dizziness ?3/25: Continues to feel better.  Pending placement ? ? ?Assessment & Plan: ?  ?Principal Problem: ?  Post concussion syndrome ?Active Problems: ?  Vertigo ?  Fall (on) (from) other stairs and steps 06/14/21, subsequent encounter ?  Spinal surgery in prior 3 months for correction of scoliosis ?  Chronic ulcerative colitis, without complications ([place]) ?  History of repair of paraesophageal hernia ?  Essential hypertension ?  Nausea and vomiting ?  Hypokalemia ? ? Post concussion syndrome ?Appreciate neurology input ?Still with progressive symptoms of nausea and dizziness ?Headache improved ?Plan: ?Continue scheduled Reglan 10 mg IV 3 times daily ?Continue Valium 2 mg 3 times daily ?No further IV fluids ?Continue PT evaluations ?  ?Fall (on) (from) other stairs and steps 06/14/21, subsequent encounter ?[address] backwards down steps hitting occipital scalp, second visit to ED in 24 hours ?At baseline, ambulates with cane following spinal surgery for idiopathic scoliosis in December 2022.   ?PT, OT eval ?CIR declined admission ?Family still undecided about  discharge plan, skilled nursing facility versus home with home health ?  ?Vertigo ?Patient with intractable  vertigo, unable to tolerate changes in position,following a fall less than 48 hours ago. ?CT head x2 within the past 24 hours showed no acute abnormality ?MRI cervical spine does not show any acute pathology ?MRI brain negative for bleed or CVA ?Suspect BPPV ?As needed meclizine added 3/22 ?Still with vertigo symptoms, though improving ?Continue therapy as tolerated ?  ?Concussion-resolved as of 06/17/2021 ?Complains of headache and vomiting and dizziness with changes of position ?Neurology consult requested ?MRI negative ?  ?Spinal surgery in prior 3 months for correction of scoliosis ?X-rays thoracolumbar spine showing intact hardware.  ?S/p surgery December 2022 (See pic below of pre and postop x-rays offered by daughter ) ?At baseline ambulates with cane ?PT, OT eval while here ?Continue with outpatient PT, OT at discharge ?  ?Hypokalemia ?Monitor and replace as necessary ?  ?Essential hypertension ?Continue Coreg ?  ?History of repair of paraesophageal hernia ?Related to prior severe scoliosis ?  ?Chronic ulcerative colitis, without complications ([place]) ?No acute issues ? ? ? ?DVT prophylaxis: SQ Lovenox ?Code Status: Full ?Family Communication: Daughter at bedside 3/22, 3/23, 3/24, 3/25 ?Disposition Plan: Status is: Inpatient ?Remains inpatient appropriate because: Still with symptoms of significant dizziness and vertigo associated with nausea.  Anticipate medical readiness for discharge in 24 to 48 hours ? ? ?Level of care: Med-Surg ? ?Consultants:  ?Neurology ? ?Procedures:  ?None ? ?Antimicrobials: ?None ? ? ?Subjective: ?Patient seen and examined.  Dizziness and nausea improved ? ?Objective: ?Vitals:  ? 06/20/21 1620 06/20/21 2050 06/21/21 0455 06/21/21 0754  ?BP: (!) 118/55 106/62 126/66 (!) 141/67  ?Pulse:  99 94 95 96  ?Resp: 17 16 16 17   ?Temp: 97.9 ?F (36.6 ?C) 98 ?F (36.7 ?C) (!) 97.4 ?F (36.3  ?C) 98.4 ?F (36.9 ?C)  ?TempSrc:  Oral Oral   ?SpO2: 97% 96% 95% 94%  ? ? ?Intake/Output Summary (Last 24 hours) at 06/21/2021 1014 ?Last data filed at 06/21/2021 0500 ?Gross per 24 hour  ?Intake --  ?Output 950 ml  ?Net -950 ml  ? ?There were no vitals filed for this visit. ? ?Examination: ? ?General exam: NAD ?Respiratory system: Clear to auscultation. Respiratory effort normal. ?Cardiovascular system: S1-S2, RRR, no murmurs, no pedal edema ?Gastrointestinal system: Soft, NT/ND, normal bowel sounds ?Central nervous system: Alert and oriented. No focal neurological deficits.  Bilateral nystagmus 2-3 beats, gait not assessed ?Extremities: 5/5 bilateral upper extremities, 4/5 bilateral lower extremities ?Skin: No rashes, lesions or ulcers ?Psychiatry: Judgement and insight appear normal. Mood & affect appropriate.  ? ? ? ?Data Reviewed: I have personally reviewed following labs and imaging studies ? ?CBC: ?Recent Labs  ?Lab 06/15/21 ?2154 06/18/21 ?0731 06/19/21 ?0750 06/21/21 ?6384  ?WBC 7.8 10.0 10.3 8.4  ?NEUTROABS 6.0 7.5 7.6 5.5  ?HGB 12.3 11.6* 12.4 11.7*  ?HCT 37.4 35.8* 37.7 36.0  ?MCV 85.8 86.9 84.7 86.5  ?PLT 435* 375 377 344  ? ?Basic Metabolic Panel: ?Recent Labs  ?Lab 06/16/21 ?2156 06/18/21 ?0731 06/19/21 ?0750 06/20/21 ?0327 06/20/21 ?1159 06/21/21 ?5364  ?NA 140 139 142 141  --  139  ?K 3.3* 2.7* 3.0* 2.6* 4.0 3.9  ?CL 110 109 107 108  --  110  ?CO2 23 25 26 24   --  23  ?GLUCOSE 164* 97 102* 104*  --  103*  ?BUN 17 15 17 19   --  17  ?CREATININE 0.67 0.53 0.66 0.57  --  0.65  ?CALCIUM 9.0 9.0 9.2 8.9  --  9.0  ?MG  --  1.7 1.7 1.7  --  1.9  ? ?GFR: ?Estimated Creatinine Clearance: 62.3 mL/min (by C-G formula based on SCr of 0.65 mg/dL). ?Liver Function Tests: ?Recent Labs  ?Lab 06/15/21 ?2154  ?AST 25  ?ALT 16  ?ALKPHOS 108  ?BILITOT 0.8  ?PROT 7.2  ?ALBUMIN 3.5  ? ?Recent Labs  ?Lab 06/15/21 ?2154  ?LIPASE 27  ? ?No results for input(s): AMMONIA in the last 168 hours. ?Coagulation Profile: ?No results  for input(s): INR, PROTIME in the last 168 hours. ?Cardiac Enzymes: ?No results for input(s): CKTOTAL, CKMB, CKMBINDEX, TROPONINI in the last 168 hours. ?BNP (last 3 results) ?No results for input(s): PROBNP in the last 8760 hours. ?HbA1C: ?No results for input(s): HGBA1C in the last 72 hours. ?CBG: ?No results for input(s): GLUCAP in the last 168 hours. ?Lipid Profile: ?No results for input(s): CHOL, HDL, LDLCALC, TRIG, CHOLHDL, LDLDIRECT in the last 72 hours. ?Thyroid Function Tests: ?No results for input(s): TSH, T4TOTAL, FREET4, T3FREE, THYROIDAB in the last 72 hours. ?Anemia Panel: ?No results for input(s): VITAMINB12, FOLATE, FERRITIN, TIBC, IRON, RETICCTPCT in the last 72 hours. ?Sepsis Labs: ?No results for input(s): PROCALCITON, LATICACIDVEN in the last 168 hours. ? ?No results found for this or any previous visit (from the past 240 hour(s)).  ? ? ? ? ? ?Radiology Studies: ?No results found. ? ? ? ? ? ?Scheduled Meds: ? buPROPion  150 mg Oral Daily  ? carvedilol  6.25 mg Oral BID WC  ? colestipol  1 g Oral Daily  ? diazepam  2 mg Oral TID  ? DULoxetine  60 mg  Oral QHS  ? enoxaparin (LOVENOX) injection  40 mg Subcutaneous Q24H  ? metoCLOPramide (REGLAN) injection  10 mg Intravenous Q8H  ? ?Continuous Infusions: ? ? ? ? LOS: 5 days  ? ? ? ? ? ?Sidney Ace, MD ?[facility] ? ? ?If 7PM-7AM, please contact night-coverage ? ?06/21/2021, 10:14 AM  ? ?

## 2021-06-21 NOTE — Progress Notes (Signed)
Continues to feel better.  Waiting for placement.  No further changes to medications at this time.  Discussed briefly with Dr. Priscella Mann. ? ?Neurology will be available as needed. ? ? ?-- ?Amie Portland, MD ?Neurologist ?[facility] ?Pager: [phone] ? ?-no charge note ? ? ?

## 2021-06-22 DIAGNOSIS — F0781 Postconcussional syndrome: Secondary | ICD-10-CM | POA: Diagnosis not present

## 2021-06-22 MED ORDER — METOCLOPRAMIDE HCL 10 MG PO TABS
10.0000 mg | ORAL_TABLET | Freq: Three times a day (TID) | ORAL | Status: DC
  Administered 2021-06-22 – 2021-06-23 (×4): 10 mg via ORAL
  Filled 2021-06-22 (×5): qty 1

## 2021-06-22 NOTE — Progress Notes (Signed)
?PROGRESS NOTE ? ? ? ?April Larsen  ASN:[id number] DOB: 1945-10-07 DOA: 06/15/2021 ?PCP: Rusty Aus, MD  ? ? ?Brief Narrative:  ?76 y.o. female with medical history significant for HTN, ulcerative colitis, who is s/p spinal surgery in December 2022 for severe acquired scoliosis admitted for persistent vomiting and dizziness and headache following a fall the day prior ?  ?3/20 -neuro consult ?3/21: Compazine, diazepam and pain medicine/muscle relaxant per neurology.  PT, OT eval ?3/22: Dizziness symptoms have persisted.  Presentation is multifactorial.  Case discussed with neurology.  Patient has evidence of BPPV bilaterally.  Limited response to Dix-Hallpike maneuver.  Also with some occipital neuralgia secondary to trauma.  Multimodal approach to symptom management ?3/23: No appreciable change in symptoms ?3/24: Improvement in nystagmus.  Still endorses dizziness ?3/25: Continues to feel better.  Pending placement ?3/26: Symptoms improved.  Patient wishes to return home.  Possible discharge in 24 hours ? ? ?Assessment & Plan: ?  ?Principal Problem: ?  Post concussion syndrome ?Active Problems: ?  Vertigo ?  Fall (on) (from) other stairs and steps 06/14/21, subsequent encounter ?  Spinal surgery in prior 3 months for correction of scoliosis ?  Chronic ulcerative colitis, without complications ([place]) ?  History of repair of paraesophageal hernia ?  Essential hypertension ?  Nausea and vomiting ?  Hypokalemia ? ? Post concussion syndrome ?Appreciate neurology input ?Nausea and dizziness improved ?Headache improved ? ?Plan: ?Switch Reglan to p.o. 10 mg 3 times daily with meals ?Continue Valium 2 mg 3 times daily ?No further IV fluids ?Continue PT evaluations ?Anticipate medical readiness for discharge 3/27 ?  ?Fall (on) (from) other stairs and steps 06/14/21, subsequent encounter ?[address] backwards down steps hitting occipital scalp, second visit to ED in 24 hours ?At baseline, ambulates with cane following spinal  surgery for idiopathic scoliosis in December 2022.   ?PT, OT eval ?CIR declined admission ?Family still undecided about discharge plan, skilled nursing facility versus home with home health.  Patient wishes to go home.  Will work with physical therapy again today and decide.  Anticipate discharge 3/27 ?  ?Vertigo ?Patient with intractable  vertigo, unable to tolerate changes in position,following a fall less than 48 hours ago. ?CT head x2 within the past 24 hours showed no acute abnormality ?MRI cervical spine does not show any acute pathology ?MRI brain negative for bleed or CVA ?Suspect BPPV ?As needed meclizine added 3/22 ?Still with vertigo symptoms, though improving ?Continue therapy as tolerated ?  ?Concussion-resolved as of 06/17/2021 ?Complains of headache and vomiting and dizziness with changes of position ?Neurology consult requested ?MRI negative ?  ?Spinal surgery in prior 3 months for correction of scoliosis ?X-rays thoracolumbar spine showing intact hardware.  ?S/p surgery December 2022 (See pic below of pre and postop x-rays offered by daughter ) ?At baseline ambulates with cane ?PT, OT eval while here ?Continue with outpatient PT, OT at discharge ?  ?Hypokalemia ?Monitor and replace as necessary ?  ?Essential hypertension ?Continue Coreg ?  ?History of repair of paraesophageal hernia ?Related to prior severe scoliosis ?  ?Chronic ulcerative colitis, without complications ([place]) ?No acute issues ? ? ? ?DVT prophylaxis: SQ Lovenox ?Code Status: Full ?Family Communication: Daughter at bedside 3/22, 3/23, 3/24, 3/25 ?Disposition Plan: Status is: Inpatient ?Remains inpatient appropriate because: Still with symptoms of significant dizziness and vertigo associated with nausea.  Anticipate medical readiness for discharge in 24 to 48 hours ? ? ?Level of care: Med-Surg ? ?Consultants:  ?Neurology ? ?Procedures:  ?  None ? ?Antimicrobials: ?None ? ? ?Subjective: ?Patient seen and examined.  Dizziness and nausea  improved ? ?Objective: ?Vitals:  ? 06/21/21 1629 06/21/21 1927 06/22/21 0446 06/22/21 0807  ?BP: 135/65 118/61 116/64 127/72  ?Pulse: 100 95 96 97  ?Resp: 16 18 18 16   ?Temp: 97.8 ?F (36.6 ?C) 97.8 ?F (36.6 ?C) 97.6 ?F (36.4 ?C) 97.9 ?F (36.6 ?C)  ?TempSrc: Oral Oral  Oral  ?SpO2: 98% 97% 92% 97%  ? ? ?Intake/Output Summary (Last 24 hours) at 06/22/2021 0950 ?Last data filed at 06/22/2021 0750 ?Gross per 24 hour  ?Intake 360 ml  ?Output 1475 ml  ?Net -1115 ml  ? ?There were no vitals filed for this visit. ? ?Examination: ? ?General exam: No acute distress ?Respiratory system: Clear to auscultation. Respiratory effort normal. ?Cardiovascular system: S1-S2, RRR, no murmurs, no pedal edema ?Gastrointestinal system: Soft, NT/ND, normal bowel sounds ?Central nervous system: Alert and oriented. No focal neurological deficits.  Nystagmus not noted on exam.  Gait not assessed ?Extremities: 5/5 bilateral upper extremities, 4/5 bilateral lower extremities ?Skin: No rashes, lesions or ulcers ?Psychiatry: Judgement and insight appear normal. Mood & affect appropriate.  ? ? ? ?Data Reviewed: I have personally reviewed following labs and imaging studies ? ?CBC: ?Recent Labs  ?Lab 06/15/21 ?2154 06/18/21 ?0731 06/19/21 ?0750 06/21/21 ?6384  ?WBC 7.8 10.0 10.3 8.4  ?NEUTROABS 6.0 7.5 7.6 5.5  ?HGB 12.3 11.6* 12.4 11.7*  ?HCT 37.4 35.8* 37.7 36.0  ?MCV 85.8 86.9 84.7 86.5  ?PLT 435* 375 377 344  ? ?Basic Metabolic Panel: ?Recent Labs  ?Lab 06/16/21 ?2156 06/18/21 ?0731 06/19/21 ?0750 06/20/21 ?0327 06/20/21 ?1159 06/21/21 ?6659  ?NA 140 139 142 141  --  139  ?K 3.3* 2.7* 3.0* 2.6* 4.0 3.9  ?CL 110 109 107 108  --  110  ?CO2 23 25 26 24   --  23  ?GLUCOSE 164* 97 102* 104*  --  103*  ?BUN 17 15 17 19   --  17  ?CREATININE 0.67 0.53 0.66 0.57  --  0.65  ?CALCIUM 9.0 9.0 9.2 8.9  --  9.0  ?MG  --  1.7 1.7 1.7  --  1.9  ? ?GFR: ?Estimated Creatinine Clearance: 62.3 mL/min (by C-G formula based on SCr of 0.65 mg/dL). ?Liver Function  Tests: ?Recent Labs  ?Lab 06/15/21 ?2154  ?AST 25  ?ALT 16  ?ALKPHOS 108  ?BILITOT 0.8  ?PROT 7.2  ?ALBUMIN 3.5  ? ?Recent Labs  ?Lab 06/15/21 ?2154  ?LIPASE 27  ? ?No results for input(s): AMMONIA in the last 168 hours. ?Coagulation Profile: ?No results for input(s): INR, PROTIME in the last 168 hours. ?Cardiac Enzymes: ?No results for input(s): CKTOTAL, CKMB, CKMBINDEX, TROPONINI in the last 168 hours. ?BNP (last 3 results) ?No results for input(s): PROBNP in the last 8760 hours. ?HbA1C: ?No results for input(s): HGBA1C in the last 72 hours. ?CBG: ?No results for input(s): GLUCAP in the last 168 hours. ?Lipid Profile: ?No results for input(s): CHOL, HDL, LDLCALC, TRIG, CHOLHDL, LDLDIRECT in the last 72 hours. ?Thyroid Function Tests: ?No results for input(s): TSH, T4TOTAL, FREET4, T3FREE, THYROIDAB in the last 72 hours. ?Anemia Panel: ?No results for input(s): VITAMINB12, FOLATE, FERRITIN, TIBC, IRON, RETICCTPCT in the last 72 hours. ?Sepsis Labs: ?No results for input(s): PROCALCITON, LATICACIDVEN in the last 168 hours. ? ?No results found for this or any previous visit (from the past 240 hour(s)).  ? ? ? ? ? ?Radiology Studies: ?No results found. ? ? ? ? ? ?  Scheduled Meds: ? buPROPion  150 mg Oral Daily  ? carvedilol  6.25 mg Oral BID WC  ? colestipol  1 g Oral Daily  ? diazepam  2 mg Oral TID  ? DULoxetine  60 mg Oral QHS  ? enoxaparin (LOVENOX) injection  40 mg Subcutaneous Q24H  ? metoCLOPramide  10 mg Oral TID AC  ? ?Continuous Infusions: ? ? ? ? LOS: 6 days  ? ? ? ? ? ?Sidney Ace, MD ?[facility] ? ? ?If 7PM-7AM, please contact night-coverage ? ?06/22/2021, 9:50 AM  ? ?

## 2021-06-22 NOTE — Progress Notes (Signed)
Mobility Specialist - Progress Note ? ? 06/22/21 1500  ?Mobility  ?Activity Ambulated with assistance in hallway;Stood at bedside;Dangled on edge of bed  ?Level of Assistance Standby assist, set-up cues, supervision of patient - no hands on  ?Assistive Device Front wheel walker  ?Distance Ambulated (ft) 200 ft  ?Activity Response Tolerated well  ?$Mobility charge 1 Mobility  ? ? ? ?Pt supine upon arrival using RA with family at bedside. Pt sits EOB with MODI and HHA for UE support. Pt completes STS with ModI + extra time. Pt ambulates 232f with SBA d/t voiced LE weakness. Pt returns to bed with needs in reach and family at bedside. ? ?MMerrily Larsen?Mobility Specialist ?06/22/21, 3:46 PM ? ? ? ?

## 2021-06-22 NOTE — Progress Notes (Signed)
Mobility Specialist - Progress Note ? ? 06/22/21 1100  ?Mobility  ?Activity Ambulated with assistance in hallway;Stood at bedside;Dangled on edge of bed  ?Level of Assistance Standby assist, set-up cues, supervision of patient - no hands on  ?Assistive Device Front wheel walker  ?Distance Ambulated (ft) 300 ft  ?Activity Response Tolerated well  ?$Mobility charge 1 Mobility  ? ? ?During mobility: 97-111HR, BP, 98% SpO2 ? ?Pt supine upon arrival using RA. Pt motivated to ambulate and completes bed mobility ModI and STS ModI + extra time ---- vc to push off the bed. Pt ambulates 300 ft with supervision voicing no complaints. Completes STS from bench in the hallway with supervision before returning to bed. Pt left with needs in reach and alarm set. ? ?Merrily Brittle ?Mobility Specialist ?06/22/21, 11:39 AM ? ? ? ? ?

## 2021-06-22 NOTE — Progress Notes (Addendum)
Brief progress note-no charge ? ?Stopped by the patient's room briefly.   ?Reports feeling much more comfortable today and dizziness is much improved. ?Working with physical therapy over the past few days and medications have helped. ?Discussing and deciding between discharge home versus short-term SNF. ?Inpatient neurology will be available as needed.  Please call with questions. ? ? ?-- ?Amie Portland, MD ?Neurologist ?[facility] ?Pager: [phone] ? ?

## 2021-06-23 DIAGNOSIS — F0781 Postconcussional syndrome: Secondary | ICD-10-CM | POA: Diagnosis not present

## 2021-06-23 LAB — CREATININE, SERUM
Creatinine, Ser: 0.73 mg/dL (ref 0.44–1.00)
GFR, Estimated: >60 mL/min (ref >60)

## 2021-06-23 MED ORDER — DIAZEPAM 2 MG PO TABS: 2.0000 mg | ORAL_TABLET | Freq: Three times a day (TID) | ORAL | 0 refills | Status: DC | PRN

## 2021-06-23 MED ORDER — MECLIZINE HCL 25 MG PO TABS: 25.0000 mg | ORAL_TABLET | Freq: Three times a day (TID) | ORAL | 0 refills | Status: AC | PRN

## 2021-06-23 MED ORDER — METOCLOPRAMIDE HCL 10 MG PO TABS
10.0000 mg | ORAL_TABLET | Freq: Three times a day (TID) | ORAL | 0 refills | Status: DC
Start: 2021-06-23 — End: 2022-01-19

## 2021-06-23 NOTE — Care Management Important Message (Signed)
Important Message ? ?Patient Details  ?Name: April Larsen ?MRN: [id number] ?Date of Birth: 01-30-1946 ? ? ?Medicare Important Message Given:  Yes ? ? ? ? ?[name] ?06/23/2021, 11:52 AM ?

## 2021-06-23 NOTE — Progress Notes (Signed)
Physical Therapy Treatment ?Patient Details ?Name: April Larsen ?MRN: [id number] ?DOB: December 09, 1945 ?Today's Date: 06/23/2021 ? ? ?History of Present Illness Pt is a 76 y.o. female who presents to the ED after suffering a fall on 3/18. Pt reports she was moving too quickly while using her cane and lost her footnig and hit the back of her head. All imaging negative; currently admitted due to vertigo w/ recurrent nausea/vomiting. PmHx: Anxiety, Arthritis, Skin Cancer, Anemia, Migraine Headaches, Chronic Urinary Incontinence. ? ?  ?PT Comments  ? ? Pt awake and alert resting in bed w/ daughter at bedside upon PT entrance into room today. Pt denies any c/o dizziness, nausea, or vomiting at rest and is very willing to work w/ PT today. She is able to perform bed mobility w/ SUPERVISION and is able to progress to standing w/ SUPERVISION using RW. Once standing she is able to ambulate ~459f w/ SUPERVISION using RW and is able to perform 4 steps w/ SUPERVISION and reliance on bilateral railing for safety. She is able to return to room and rest in recliner w/ all needs w/in reach without any increase in negative symptoms. Pt will benefit from continued skilled PT in order to improve balance/mobility/gait and restore PLOF. Current discharge recommendation updated to HHPT due to Pt improvement and current level of assistance required by the patient to ensure safety and improve overall function. ?  ?Recommendations for follow up therapy are one component of a multi-disciplinary discharge planning process, led by the attending physician.  Recommendations may be updated based on patient status, additional functional criteria and insurance authorization. ? ?Follow Up Recommendations ? Home health PT ?  ?  ?Assistance Recommended at Discharge Intermittent Supervision/Assistance  ?Patient can return home with the following A little help with walking and/or transfers;A little help with bathing/dressing/bathroom;Assist for  transportation;Help with stairs or ramp for entrance;Assistance with cooking/housework ?  ?Equipment Recommendations ? Rolling walker (2 wheels);BSC/3in1  ?  ?Recommendations for Other Services   ? ? ?  ?Precautions / Restrictions Precautions ?Precautions: Fall ?Precaution Comments: recent spine fusion (12/22) ?Restrictions ?Weight Bearing Restrictions: No  ?  ? ?Mobility ? Bed Mobility ?Overal bed mobility: Needs Assistance ?Bed Mobility: Supine to Sit ?  ?  ?Supine to sit: Supervision ?  ?  ?  ?  ? ?Transfers ?Overall transfer level: Needs assistance ?Equipment used: Rolling walker (2 wheels) ?Transfers: Sit to/from Stand ?Sit to Stand: Supervision ?  ?  ?  ?  ?  ?  ?  ? ?Ambulation/Gait ?Ambulation/Gait assistance: Supervision ?Gait Distance (Feet): 450 Feet ?Assistive device: Rolling walker (2 wheels) ?Gait Pattern/deviations: Step-to pattern, Decreased step length - right, Decreased step length - left, Decreased stride length ?Gait velocity: decreased ?  ?  ?  ? ? ?Stairs ?Stairs: Yes ?Stairs assistance: Supervision ?Stair Management: Two rails, Forwards ?Number of Stairs: 4 ?  ? ? ?Wheelchair Mobility ?  ? ?Modified Rankin (Stroke Patients Only) ?  ? ? ?  ?Balance Overall balance assessment: Needs assistance ?Sitting-balance support: Feet supported, Bilateral upper extremity supported ?Sitting balance-Leahy Scale: Good ?  ?  ?Standing balance support: During functional activity, Reliant on assistive device for balance, Bilateral upper extremity supported ?Standing balance-Leahy Scale: Good ?  ?  ?  ?  ?  ?  ?  ?  ?  ?  ?  ?  ?  ? ?  ?Cognition Arousal/Alertness: Awake/alert ?Behavior During Therapy: [facility]for tasks assessed/performed ?Overall Cognitive Status: Within Functional Limits for tasks assessed ?  ?  ?  ?  ?  ?  ?  ?  ?  ?  ?  ?  ?  ?  ?  ?  ?  ?  ?  ? ?  ?  Exercises   ? ?  ?General Comments   ?  ?  ? ?Pertinent Vitals/Pain Pain Assessment ?Pain Assessment: No/denies pain  ? ? ?Home Living   ?  ?  ?  ?   ?  ?  ?  ?  ?  ?   ?  ?Prior Function    ?  ?  ?   ? ?PT Goals (current goals can now be found in the care plan section) Progress towards PT goals: Progressing toward goals ? ?  ?Frequency ? ? ? Min 2X/week ? ? ? ?  ?PT Plan Discharge plan needs to be updated  ? ? ?Co-evaluation   ?  ?  ?  ?  ? ?  ?AM-PAC PT "6 Clicks" Mobility   ?Outcome Measure ? Help needed turning from your back to your side while in a flat bed without using bedrails?: A Little ?Help needed moving from lying on your back to sitting on the side of a flat bed without using bedrails?: A Little ?Help needed moving to and from a bed to a chair (including a wheelchair)?: A Little ?Help needed standing up from a chair using your arms (e.g., wheelchair or bedside chair)?: A Little ?Help needed to walk in hospital room?: A Little ?Help needed climbing 3-5 steps with a railing? : A Little ?6 Click Score: 18 ? ?  ?End of Session Equipment Utilized During Treatment: Gait belt ?Activity Tolerance: Patient tolerated treatment well ?Patient left: in chair;with chair alarm set;with call bell/phone within reach;with family/visitor present ?Nurse Communication: Mobility status ?PT Visit Diagnosis: History of falling (Z91.81);Muscle weakness (generalized) (M62.81);Unsteadiness on feet (R26.81) ?  ? ? ?Time: 0920-1001 ?PT Time Calculation (min) (ACUTE ONLY): 41 min ? ?Charges:             ?          ? ? ? ? ?Jonnie Kind, SPT ?06/23/2021, 11:46 AM ? ?

## 2021-06-23 NOTE — Discharge Summary (Signed)
Physician Discharge Summary  ?April Larsen April Larsen DOB: 1946-03-15 DOA: 06/15/2021 ? ?PCP: Rusty Aus, MD ? ?Admit date: 06/15/2021 ?Discharge date: 06/23/2021 ? ?Admitted From: Home ?Disposition: Home with home health ? ?Recommendations for Outpatient Follow-up:  ?Follow up with PCP in 1-2 weeks ?Follow-up outpatient [facility] neurology ? ?Home Health: Yes PT OT RN aide ?Equipment/Devices: None ? ?Discharge Condition: Stable ?CODE STATUS: Full ?Diet recommendation: Regular ? ?Brief/Interim Summary: ?76 y.o. female with medical history significant for HTN, ulcerative colitis, who is s/p spinal surgery in December 2022 for severe acquired scoliosis admitted for persistent vomiting and dizziness and headache following a fall the day prior ?  ?3/20 -neuro consult ?3/21: Compazine, diazepam and pain medicine/muscle relaxant per neurology.  PT, OT eval ?3/22: Dizziness symptoms have persisted.  Presentation is multifactorial.  Case discussed with neurology.  Patient has evidence of BPPV bilaterally.  Limited response to Dix-Hallpike maneuver.  Also with some occipital neuralgia secondary to trauma.  Multimodal approach to symptom management ?3/23: No appreciable change in symptoms ?3/24: Improvement in nystagmus.  Still endorses dizziness ?3/25: Continues to feel better.  Pending placement ?3/26: Symptoms improved.  Patient wishes to return home.  Possible discharge in 24 hours ?3/27: Symptoms continue to improve.  Patient ambulating without too much difficulty.  Stable for discharge home with home health services.  Follow-up outpatient PCP and [facility] neurology ? ? ? ?Discharge Diagnoses:  ?Principal Problem: ?  Post concussion syndrome ?Active Problems: ?  Vertigo ?  Fall (on) (from) other stairs and steps 06/14/21, subsequent encounter ?  Spinal surgery in prior 3 months for correction of scoliosis ?  Chronic ulcerative colitis, without complications ([place]) ?  History of repair of paraesophageal hernia ?   Essential hypertension ?  Nausea and vomiting ?  Hypokalemia ? ? Post concussion syndrome ?Appreciate neurology input ?Nausea and dizziness improved ?Headache improved ?Plan: ?Discharge home.  Will continue p.o. Reglan 3 times daily and p.o. Valium 2 mg 3 times daily as needed.  Home health services ordered.  Outpatient PT referral.  Outpatient referral to [facility] neurology ? ?  ?Fall (on) (from) other stairs and steps 06/14/21, subsequent encounter ?[address] backwards down steps hitting occipital scalp, second visit to ED in 24 hours ?At baseline, ambulates with cane following spinal surgery for idiopathic scoliosis in December 2022.   ?PT, OT eval ?CIR declined admission ?Patient improved.  Will discharge home with home health services ?  ?Vertigo ?Patient with intractable  vertigo, unable to tolerate changes in position,following a fall less than 48 hours ago. ?CT head x2 within the past 24 hours showed no acute abnormality ?MRI cervical spine does not show any acute pathology ?MRI brain negative for bleed or CVA ?Suspect BPPV ?As needed meclizine added 3/22 ? ?  ?Concussion-resolved as of 06/17/2021 ?Complains of headache and vomiting and dizziness with changes of position ?Neurology consult requested ?MRI negative ?  ?Spinal surgery in prior 3 months for correction of scoliosis ?X-rays thoracolumbar spine showing intact hardware.  ?S/p surgery December 2022 (See pic below of pre and postop x-rays offered by daughter ) ?At baseline ambulates with cane ?PT, OT eval while here ?Continue with outpatient PT, OT at discharge ?  ? ?Discharge Instructions ? ?Discharge Instructions   ? ? Ambulatory referral to Physical Therapy   Complete by: As directed ?  ? Vestibular therapy  ? Diet - low sodium heart healthy   Complete by: As directed ?  ? Increase activity slowly   Complete by: As  directed ?  ? ?  ? ?Allergies as of 06/23/2021   ? ?   Reactions  ? Nsaids Other (See Comments)  ? Patient has had part of her colon removed    ? Sulfa Antibiotics Rash  ? ?  ? ?  ?Medication List  ?  ? ?STOP taking these medications   ? ?HYDROcodone-acetaminophen 5-325 MG tablet ?Commonly known as: Norco ?  ?ondansetron 4 MG disintegrating tablet ?Commonly known as: ZOFRAN-ODT ?  ? ?  ? ?TAKE these medications   ? ?acetaminophen 500 MG tablet ?Commonly known as: TYLENOL ?Take 1,000 mg by mouth every 6 (six) hours as needed for moderate pain or headache. ?  ?B-complex with vitamin C tablet ?Take 1 tablet by mouth daily. ?  ?buPROPion 150 MG 24 hr tablet ?Commonly known as: WELLBUTRIN XL ?Take 150 mg by mouth daily. ?  ?CALCIUM 600+D PO ?Take 1 tablet by mouth 2 (two) times daily. ?  ?diazepam 2 MG tablet ?Commonly known as: VALIUM ?Take 1 tablet (2 mg total) by mouth every 8 (eight) hours as needed for up to 14 days (dizziness). ?  ?DULoxetine 60 MG capsule ?Commonly known as: CYMBALTA ?Take 60 mg by mouth at bedtime. ?  ?furosemide 20 MG tablet ?Commonly known as: LASIX ?Take 20 mg by mouth daily as needed for fluid or edema. ?  ?gabapentin 300 MG capsule ?Commonly known as: NEURONTIN ?Take 300 mg by mouth 3 (three) times daily as needed (pain). ?  ?meclizine 25 MG tablet ?Commonly known as: ANTIVERT ?Take 1 tablet (25 mg total) by mouth 3 (three) times daily as needed for dizziness or nausea. ?  ?mesalamine 0.375 g 24 hr capsule ?Commonly known as: APRISO ?Take 4 capsules (1.5 g total) by mouth daily. ?  ?metoCLOPramide 10 MG tablet ?Commonly known as: REGLAN ?Take 1 tablet (10 mg total) by mouth 3 (three) times daily before meals for 14 days. ?  ?pantoprazole 40 MG tablet ?Commonly known as: PROTONIX ?Take 40 mg by mouth daily. ?  ?Vitamin D3 50 MCG (2000 UT) Tabs ?Take 2,000 Units by mouth daily. ?  ? ?  ? ? Follow-up Information   ? ? Rusty Aus, MD. Schedule an appointment as soon as possible for a visit in 1 week(s).   ?Specialty: Internal Medicine ?Contact information: ?[place] ?[facility] ?[place]  [address] ?[phone] ? ? ?  ?  ? ? Vladimir Crofts, MD. Schedule an appointment as soon as possible for a visit in 1 week(s).   ?Specialty: Neurology ?Why: Call to establish followup appointment with Dr. Manuella Ghazi or any available provider at [facility] neurology ?Contact information: ?[place] ?[facility] ?[place] [address] ?[phone] ? ? ?  ?  ? ?  ?  ? ?  ? ?Allergies  ?Allergen Reactions  ? Nsaids Other (See Comments)  ?  Patient has had part of her colon removed   ? Sulfa Antibiotics Rash  ? ? ?Consultations: ?Neurology ? ? ?Procedures/Studies: ?DG Chest 2 View ? ?Result Date: 06/15/2021 ?CLINICAL DATA:  Fall, shoulder pain EXAM: CHEST - 2 VIEW COMPARISON:  03/25/2021 FINDINGS: Lungs are clear. No pneumothorax or pleural effusion. Cardiac size within normal limits. Pulmonary vascularity is normal. Thoracolumbar fusion hardware is partially visualized. No acute bone abnormality. Right shoulder is largely excluded from view. IMPRESSION: No active cardiopulmonary disease. Electronically Signed   By: Fidela Salisbury M.D.   On: 06/15/2021 01:26  ? ?DG Thoracic Spine 2  View ? ?Result Date: 06/15/2021 ?CLINICAL DATA:  Fall, back pain EXAM: THORACIC SPINE 2 VIEWS COMPARISON:  None. FINDINGS: Thoracolumbar spine fixation hardware with mild lower thoracic dextroscoliosis. Normal thoracic kyphosis. No evidence of fracture or dislocation. Vertebral body heights are maintained. No evidence of hardware complication. Mild degenerative changes of the mid/lower thoracic spine. IMPRESSION: Thoracolumbar spine fixation hardware, without evidence of complication. No fracture or dislocation is seen. Electronically Signed   By: Julian Hy M.D.   On: 06/15/2021 01:27  ? ?DG Lumbar Spine Complete ? ?Result Date: 06/15/2021 ?CLINICAL DATA:  Fall, back pain EXAM: LUMBAR SPINE - COMPLETE 4+ VIEW COMPARISON:  None. FINDINGS: Lumbosacral spine fixation hardware, without evidence of complication. Moderate  lumbar levoscoliosis with degenerative changes. Normal lumbar lordosis. No evidence of fracture or dislocation. Vertebral body heights are maintained. Visualized bony pelvis appears intact. IMPRESSION: Lumbosacr

## 2021-06-23 NOTE — Progress Notes (Signed)
Mobility Specialist - Progress Note ? ? 06/23/21 1300  ?Mobility  ?Activity Ambulated with assistance in hallway  ?Level of Assistance Standby assist, set-up cues, supervision of patient - no hands on  ?Assistive Device Front wheel walker  ?Distance Ambulated (ft) 180 ft  ?Activity Response Tolerated well  ?$Mobility charge 1 Mobility  ? ? ? ?Pt sitting in recliner upon arrival, utilizing RA. Pt motivated for OOB activity. STS from chair and ambulation with supervision, no LOB. No complaints. Pt returned to chair with alarm set, needs in reach. Anticipating d/c this afternoon.  ? ? ?Kathee Delton ?Mobility Specialist ?06/23/21, 1:11 PM ? ? ? ? ?

## 2021-06-23 NOTE — TOC Progression Note (Signed)
Transition of Care (TOC) - Progression Note  ? ? ?Patient Details  ?Name: April Larsen ?MRN: [id number] ?Date of Birth: [birth year] ? ?Transition of Care (TOC) CM/SW Contact  ?Karoline Fleer A Erendira Crabtree, LCSW ?Phone Number: ?06/23/2021, 11:18 AM ? ?Clinical Narrative:   CSW spoke with pt's daughter and explained CSW unable to find pt [place] agency that will service pt due to insurance. Pt's daughter is agreeable and would prefer pt continue outpatient at [facility] as pt was doing prior to admission. CSW will fax referral. ? ? ? ?Expected Discharge Plan: Home/Self Care ?Barriers to Discharge: Continued Medical Work up ? ?Expected Discharge Plan and Services ?Expected Discharge Plan: Home/Self Care ?In-house Referral: Clinical Social Work ?  ?Post Acute Care Choice: NA ?Living arrangements for the past 2 months: [place] ?                ?  ?  ?  ?  ?  ?  ?  ?  ?  ?  ? ? ?Social Determinants of Health (SDOH) Interventions ?  ? ?Readmission Risk Interventions ?   ? View : No data to display.  ?  ?  ?  ? ? ?

## 2021-06-27 ENCOUNTER — Emergency Department: Payer: PPO

## 2021-06-27 ENCOUNTER — Encounter: Payer: Self-pay | Admitting: Emergency Medicine

## 2021-06-27 ENCOUNTER — Other Ambulatory Visit: Payer: Self-pay

## 2021-06-27 ENCOUNTER — Observation Stay
Admission: EM | Admit: 2021-06-27 | Discharge: 2021-07-02 | Disposition: A | Discharge status: Skilled Nursing Facility | Payer: PPO | Attending: Family Medicine | Admitting: Family Medicine

## 2021-06-27 DIAGNOSIS — Z85828 Personal history of other malignant neoplasm of skin: Secondary | ICD-10-CM | POA: Insufficient documentation

## 2021-06-27 DIAGNOSIS — R410 Disorientation, unspecified: Secondary | ICD-10-CM | POA: Insufficient documentation

## 2021-06-27 DIAGNOSIS — S299XXA Unspecified injury of thorax, initial encounter: Secondary | ICD-10-CM | POA: Diagnosis present

## 2021-06-27 DIAGNOSIS — Z79899 Other long term (current) drug therapy: Secondary | ICD-10-CM | POA: Insufficient documentation

## 2021-06-27 DIAGNOSIS — S0990XA Unspecified injury of head, initial encounter: Secondary | ICD-10-CM | POA: Diagnosis not present

## 2021-06-27 DIAGNOSIS — W19XXXA Unspecified fall, initial encounter: Secondary | ICD-10-CM | POA: Diagnosis present

## 2021-06-27 DIAGNOSIS — I6782 Cerebral ischemia: Secondary | ICD-10-CM | POA: Diagnosis not present

## 2021-06-27 DIAGNOSIS — I10 Essential (primary) hypertension: Secondary | ICD-10-CM | POA: Diagnosis present

## 2021-06-27 DIAGNOSIS — M419 Scoliosis, unspecified: Secondary | ICD-10-CM | POA: Diagnosis not present

## 2021-06-27 DIAGNOSIS — R072 Precordial pain: Secondary | ICD-10-CM | POA: Diagnosis not present

## 2021-06-27 DIAGNOSIS — I480 Paroxysmal atrial fibrillation: Secondary | ICD-10-CM | POA: Diagnosis not present

## 2021-06-27 DIAGNOSIS — K5190 Ulcerative colitis, unspecified, without complications: Secondary | ICD-10-CM | POA: Diagnosis not present

## 2021-06-27 DIAGNOSIS — J9601 Acute respiratory failure with hypoxia: Secondary | ICD-10-CM | POA: Insufficient documentation

## 2021-06-27 DIAGNOSIS — I6523 Occlusion and stenosis of bilateral carotid arteries: Secondary | ICD-10-CM | POA: Diagnosis not present

## 2021-06-27 DIAGNOSIS — F32A Depression, unspecified: Secondary | ICD-10-CM | POA: Diagnosis not present

## 2021-06-27 DIAGNOSIS — E876 Hypokalemia: Secondary | ICD-10-CM | POA: Diagnosis not present

## 2021-06-27 DIAGNOSIS — R000 Tachycardia, unspecified: Secondary | ICD-10-CM | POA: Insufficient documentation

## 2021-06-27 DIAGNOSIS — S060X0A Concussion without loss of consciousness, initial encounter: Secondary | ICD-10-CM | POA: Diagnosis not present

## 2021-06-27 DIAGNOSIS — G8929 Other chronic pain: Secondary | ICD-10-CM | POA: Diagnosis not present

## 2021-06-27 DIAGNOSIS — Z981 Arthrodesis status: Secondary | ICD-10-CM | POA: Diagnosis not present

## 2021-06-27 DIAGNOSIS — R2681 Unsteadiness on feet: Secondary | ICD-10-CM | POA: Insufficient documentation

## 2021-06-27 DIAGNOSIS — J984 Other disorders of lung: Secondary | ICD-10-CM | POA: Diagnosis not present

## 2021-06-27 DIAGNOSIS — E559 Vitamin D deficiency, unspecified: Secondary | ICD-10-CM | POA: Insufficient documentation

## 2021-06-27 DIAGNOSIS — S2232XA Fracture of one rib, left side, initial encounter for closed fracture: Secondary | ICD-10-CM | POA: Diagnosis not present

## 2021-06-27 DIAGNOSIS — W108XXD Fall (on) (from) other stairs and steps, subsequent encounter: Secondary | ICD-10-CM | POA: Diagnosis present

## 2021-06-27 DIAGNOSIS — E0590 Thyrotoxicosis, unspecified without thyrotoxic crisis or storm: Secondary | ICD-10-CM

## 2021-06-27 DIAGNOSIS — R0789 Other chest pain: Secondary | ICD-10-CM | POA: Diagnosis not present

## 2021-06-27 DIAGNOSIS — R296 Repeated falls: Secondary | ICD-10-CM | POA: Insufficient documentation

## 2021-06-27 DIAGNOSIS — I119 Hypertensive heart disease without heart failure: Secondary | ICD-10-CM | POA: Diagnosis not present

## 2021-06-27 DIAGNOSIS — K449 Diaphragmatic hernia without obstruction or gangrene: Secondary | ICD-10-CM | POA: Diagnosis not present

## 2021-06-27 DIAGNOSIS — I672 Cerebral atherosclerosis: Secondary | ICD-10-CM | POA: Diagnosis not present

## 2021-06-27 DIAGNOSIS — W010XXA Fall on same level from slipping, tripping and stumbling without subsequent striking against object, initial encounter: Secondary | ICD-10-CM | POA: Diagnosis not present

## 2021-06-27 DIAGNOSIS — Z20822 Contact with and (suspected) exposure to covid-19: Secondary | ICD-10-CM | POA: Insufficient documentation

## 2021-06-27 DIAGNOSIS — S2242XA Multiple fractures of ribs, left side, initial encounter for closed fracture: Secondary | ICD-10-CM | POA: Diagnosis not present

## 2021-06-27 DIAGNOSIS — I4891 Unspecified atrial fibrillation: Secondary | ICD-10-CM | POA: Diagnosis not present

## 2021-06-27 DIAGNOSIS — G629 Polyneuropathy, unspecified: Secondary | ICD-10-CM | POA: Insufficient documentation

## 2021-06-27 DIAGNOSIS — R600 Localized edema: Secondary | ICD-10-CM | POA: Diagnosis not present

## 2021-06-27 DIAGNOSIS — F0781 Postconcussional syndrome: Secondary | ICD-10-CM | POA: Diagnosis not present

## 2021-06-27 DIAGNOSIS — R42 Dizziness and giddiness: Secondary | ICD-10-CM

## 2021-06-27 DIAGNOSIS — H00019 Hordeolum externum unspecified eye, unspecified eyelid: Secondary | ICD-10-CM | POA: Diagnosis not present

## 2021-06-27 DIAGNOSIS — R079 Chest pain, unspecified: Secondary | ICD-10-CM | POA: Diagnosis present

## 2021-06-27 DIAGNOSIS — F419 Anxiety disorder, unspecified: Secondary | ICD-10-CM | POA: Insufficient documentation

## 2021-06-27 DIAGNOSIS — R0602 Shortness of breath: Secondary | ICD-10-CM | POA: Insufficient documentation

## 2021-06-27 DIAGNOSIS — M47812 Spondylosis without myelopathy or radiculopathy, cervical region: Secondary | ICD-10-CM | POA: Diagnosis not present

## 2021-06-27 LAB — CBC
HCT: 43.7 % (ref 36.0–46.0)
HCT: 38.5 % (ref 36.0–46.0)
Hemoglobin: 14.9 g/dL (ref 12.0–15.0)
Hemoglobin: 12.7 g/dL (ref 12.0–15.0)
MCH: 28.8 pg (ref 26.0–34.0)
MCH: 28.1 pg (ref 26.0–34.0)
MCHC: 34.1 g/dL (ref 30.0–36.0)
MCHC: 33 g/dL (ref 30.0–36.0)
MCV: 84.5 fL (ref 80.0–100.0)
MCV: 85.2 fL (ref 80.0–100.0)
Platelets: 513 10*3/uL — ABNORMAL HIGH (ref 150–400)
Platelets: 404 10*3/uL — ABNORMAL HIGH (ref 150–400)
RBC: 5.17 MIL/uL — ABNORMAL HIGH (ref 3.87–5.11)
RBC: 4.52 MIL/uL (ref 3.87–5.11)
RDW: 14.4 % (ref 11.5–15.5)
RDW: 14.4 % (ref 11.5–15.5)
WBC: 12.5 10*3/uL — ABNORMAL HIGH (ref 4.0–10.5)
WBC: 11.1 10*3/uL — ABNORMAL HIGH (ref 4.0–10.5)
nRBC: 0 % (ref 0.0–0.2)
nRBC: 0 % (ref 0.0–0.2)

## 2021-06-27 LAB — TSH: TSH: <0.01 u[IU]/mL — ABNORMAL LOW (ref 0.350–4.500)

## 2021-06-27 LAB — BASIC METABOLIC PANEL
Anion gap: 12 (ref 5–15)
BUN: 24 mg/dL — ABNORMAL HIGH (ref 8–23)
CO2: 22 mmol/L (ref 22–32)
Calcium: 10.1 mg/dL (ref 8.9–10.3)
Chloride: 98 mmol/L (ref 98–111)
Creatinine, Ser: 0.76 mg/dL (ref 0.44–1.00)
GFR, Estimated: >60 mL/min (ref >60)
Glucose, Bld: 105 mg/dL — ABNORMAL HIGH (ref 70–99)
Potassium: 2.9 mmol/L — ABNORMAL LOW (ref 3.5–5.1)
Sodium: 132 mmol/L — ABNORMAL LOW (ref 135–145)

## 2021-06-27 LAB — CREATININE, SERUM
Creatinine, Ser: 0.59 mg/dL (ref 0.44–1.00)
GFR, Estimated: >60 mL/min (ref >60)

## 2021-06-27 LAB — RESP PANEL BY RT-PCR (FLU A&B, COVID) ARPGX2
Influenza A by PCR: NEGATIVE
Influenza B by PCR: NEGATIVE
SARS Coronavirus 2 by RT PCR: NEGATIVE

## 2021-06-27 LAB — BRAIN NATRIURETIC PEPTIDE: B Natriuretic Peptide: 12.3 pg/mL (ref 0.0–100.0)

## 2021-06-27 LAB — MAGNESIUM: Magnesium: 1.7 mg/dL (ref 1.7–2.4)

## 2021-06-27 MED ORDER — POTASSIUM CHLORIDE 10 MEQ/100ML IV SOLN
10.0000 meq | Freq: Once | INTRAVENOUS | Status: AC
  Administered 2021-06-27: 10 meq via INTRAVENOUS
  Filled 2021-06-27: qty 100

## 2021-06-27 MED ORDER — HEPARIN SODIUM (PORCINE) 5000 UNIT/ML IJ SOLN
5000.0000 [IU] | Freq: Three times a day (TID) | INTRAMUSCULAR | Status: DC
  Administered 2021-06-27 – 2021-06-29 (×6): 5000 [IU] via SUBCUTANEOUS
  Filled 2021-06-27 (×6): qty 1

## 2021-06-27 MED ORDER — LACTATED RINGERS IV BOLUS
1000.0000 mL | Freq: Once | INTRAVENOUS | Status: AC
  Administered 2021-06-27: 1000 mL via INTRAVENOUS

## 2021-06-27 MED ORDER — MAGNESIUM SULFATE 2 GM/50ML IV SOLN
2.0000 g | Freq: Once | INTRAVENOUS | Status: AC
  Administered 2021-06-27: 2 g via INTRAVENOUS
  Filled 2021-06-27: qty 50

## 2021-06-27 MED ORDER — ACETAMINOPHEN 650 MG RE SUPP: 650.0000 mg | Freq: Four times a day (QID) | RECTAL | Status: DC | PRN

## 2021-06-27 MED ORDER — IOHEXOL 350 MG/ML SOLN
80.0000 mL | Freq: Once | INTRAVENOUS | Status: AC | PRN
  Administered 2021-06-27: 80 mL via INTRAVENOUS
  Filled 2021-06-27: qty 80

## 2021-06-27 MED ORDER — POTASSIUM CHLORIDE CRYS ER 20 MEQ PO TBCR
40.0000 meq | EXTENDED_RELEASE_TABLET | Freq: Once | ORAL | Status: AC
  Administered 2021-06-27: 40 meq via ORAL
  Filled 2021-06-27: qty 2

## 2021-06-27 MED ORDER — ACETAMINOPHEN 325 MG PO TABS
650.0000 mg | ORAL_TABLET | Freq: Four times a day (QID) | ORAL | Status: DC | PRN
  Administered 2021-06-27 – 2021-07-01 (×8): 650 mg via ORAL
  Filled 2021-06-27 (×8): qty 2

## 2021-06-27 NOTE — ED Triage Notes (Signed)
Pt reports had back surgery in December and fell the first time a few weeks ago and had a concussion and the past few days she has fallen 2 more times. Pt reports got tangled up in something to day when she fell. Pt reports feels nauseated. Pt reports when she had her consussion she also had a CT scan which was negative. Pt reports when she fell today she hit her head on the dresser and her left ribs on the walker. Pt also has a skin tear on her right forearm. Pt denies LOC. Pt right arm wrapped in bandage. Pt reports she feels really nauseated right now and reports did not before her fall. Pt speaking with eyes closed, denies dizziness.  ?

## 2021-06-27 NOTE — Assessment & Plan Note (Deleted)
Patient has a history of ulcerative colitis and follows up with Dr. Marius Ditch. ?No recent GI symptoms of diarrhea or bleeding. ?Discussed with her to indeed follow-up with GI and not miss any appointments for follow-up. ?

## 2021-06-27 NOTE — Assessment & Plan Note (Addendum)
-  Continue meclizine ? ?

## 2021-06-27 NOTE — ED Notes (Signed)
Request made for transport to the floor ?

## 2021-06-27 NOTE — ED Notes (Signed)
Pt placed on purwick and moved up in the bed without incident.  ?

## 2021-06-27 NOTE — ED Provider Notes (Signed)
? ?[facility] ?Provider Note ? ? ? Event Date/Time  ? First MD Initiated Contact with Patient 06/27/21 1703   ?  (approximate) ? ? ?History  ? ?Fall ? ? ?HPI ? ?April Larsen is a 76 y.o. female with history of anemia, ulcerative colitis, GERD, hiatal hernia, scoliosis status post surgery who presents for evaluation after a mechanical fall. Patient tripped with her walker and fell.  She reports hitting her head on the dresser and the left chest wall as well.  She is complaining of a mild headache and left-sided chest wall pain.  She reports feeling very nauseous as well.  She is having a little bit of shortness of breath because it is hard to take a deep breath.  Patient reports that the fall was mechanical in nature.  Patient was recently discharged from the hospital 4 days ago after being admitted for concussion from a previous fall.  According to the daughter patient has been doing very poorly at home.  Has not been able to stand up without family assistance.  She has had family with her 24/7 to be able to assist her in the home.  She has been slightly confused as well which is new for them.  She has had a mild cough.  She denies fever or chills. ?  ? ? ?Past Medical History:  ?Diagnosis Date  ? Actinic keratosis   ? Anemia   ? LAST HGB 15.2 ON 04-22-16  ? Anxiety   ? Arthritis   ? Cancer [facility]) 2016  ? skin  ? Cataract (lens) fragments in eye following cataract surgery, bilateral 2013  ? one eye done then 4 weeks later the other eye done.  ? Chronic ulcerative colitis, without complications ([place]) 08/28/4429  ? Diverticulitis   ? Esophagitis, reflux 10/04/2014  ? Family history of adverse reaction to anesthesia   ? SISTER HARD TO WAKE UP and nausea and vomiting.  ? Female stress incontinence 08/09/2013  ? Gastroesophageal reflux disease without esophagitis 08/06/2016  ? GERD (gastroesophageal reflux disease)   ? Headache   ? MIGRAINE  ? History of hiatal hernia   ? Incomplete emptying of bladder  10/29/2013  ? Migraine 07/26/2013  ? PONV (postoperative nausea and vomiting)   ? nausea, does not remember vomiting.  ? Postherpetic neuralgia   ? RIGHT 7TH.CRANIAL NERVE from shingles followed by Bell's palsy  ? Postmenopausal 11/09/2016  ? PVC (premature ventricular contraction)   ? Scoliosis   ? Scoliosis (and kyphoscoliosis), idiopathic 07/26/2013  ? Scoliosis of lumbosacral spine 01/18/2015  ? Squamous cell carcinoma of skin 03/13/2013  ? L clavicle   ? Squamous cell carcinoma of skin 03/10/2018  ? L med knee  ? Squamous cell carcinoma of skin 02/21/2019  ? R cheek   ? Squamous cell carcinoma of skin 06/01/2019  ? L mid lat pretibial - ED&C  ? Trochanteric bursitis 11/29/2014  ? Ulcerative colitis ([place])   ? Urethral prolapse 08/09/2013  ? Urge incontinence 08/09/2013  ? ? ?Past Surgical History:  ?Procedure Laterality Date  ? ABDOMINAL HYSTERECTOMY    ? BACK SURGERY    ? L3-L4 AND L3-S1 DECOMPRESSION  ? CARPAL TUNNEL RELEASE    ? CHOLECYSTECTOMY N/A 08/21/2016  ? Procedure: LAPAROSCOPIC CHOLECYSTECTOMY WITH INTRAOPERATIVE CHOLANGIOGRAM;  Surgeon: Leonie Green, MD;  Location: [facility] ORS;  Service: General;  Laterality: N/A;  ? COLON SURGERY    ? COLONOSCOPY  11/06/2005  ? COLONOSCOPY WITH PROPOFOL N/A 07/29/2016  ?  Procedure: COLONOSCOPY WITH PROPOFOL;  Surgeon: Manya Silvas, MD;  Location: [facility] ENDOSCOPY;  Service: Endoscopy;  Laterality: N/A;  ? COLONOSCOPY WITH PROPOFOL N/A 08/22/2020  ? Procedure: COLONOSCOPY WITH PROPOFOL;  Surgeon: Virgel Manifold, MD;  Location: [facility] ENDOSCOPY;  Service: Endoscopy;  Laterality: N/A;  ? ESOPHAGOGASTRODUODENOSCOPY  11/06/2005  ? ESOPHAGOGASTRODUODENOSCOPY (EGD) WITH PROPOFOL N/A 07/29/2016  ? Procedure: ESOPHAGOGASTRODUODENOSCOPY (EGD) WITH PROPOFOL;  Surgeon: Manya Silvas, MD;  Location: [facility] ENDOSCOPY;  Service: Endoscopy;  Laterality: N/A;  ? ESOPHAGOGASTRODUODENOSCOPY (EGD) WITH PROPOFOL N/A 02/07/2020  ? Procedure: ESOPHAGOGASTRODUODENOSCOPY (EGD) WITH PROPOFOL;   Surgeon: Virgel Manifold, MD;  Location: [facility] ENDOSCOPY;  Service: Endoscopy;  Laterality: N/A;  ? LAPAROSCOPIC LOW ANTERIOR RESECTION  11/24/2016  ? Procedure: LAPAROSCOPIC LOW ANTERIOR RESECTION;  Surgeon: Jules Husbands, MD;  Location: [facility] ORS;  Service: General;;  ? XI ROBOTIC ASSISTED PARAESOPHAGEAL HERNIA REPAIR N/A 02/27/2020  ? Procedure: XI ROBOTIC ASSISTED PARAESOPHAGEAL HERNIA REPAIR;  Surgeon: Jules Husbands, MD;  Location: [facility] ORS;  Service: General;  Laterality: N/A;  ? ? ? ?Physical Exam  ? ?Triage Vital Signs: ?ED Triage Vitals  ?Enc Vitals Group  ?   BP 06/27/21 1512 (!) 108/94  ?   Pulse Rate 06/27/21 1512 (!) 141  ?   Resp 06/27/21 1512 20  ?   Temp 06/27/21 1512 97.9 ?F (36.6 ?C)  ?   Temp Source 06/27/21 1512 Oral  ?   SpO2 06/27/21 1512 95 %  ?   Weight 06/27/21 1511 150 lb (68 kg)  ?   Height 06/27/21 1511 5' 6"  (1.676 m)  ?   Head Circumference --   ?   Peak Flow --   ?   Pain Score 06/27/21 1510 5  ?   Pain Loc --   ?   Pain Edu? --   ?   Excl. in [place]? --   ? ? ?Most recent vital signs: ?Vitals:  ? 06/27/21 1830 06/27/21 1845  ?BP:    ?Pulse:  (!) 37  ?Resp: (!) 21 14  ?Temp:    ?SpO2:  (!) 82%  ? ? ?Full spinal precautions maintained throughout the trauma exam. ?Constitutional: Alert and oriented. No acute distress. Does not appear intoxicated. ?HEENT ?Head: Normocephalic and atraumatic. ?Face: No facial bony tenderness. Stable midface ?Ears: No hemotympanum bilaterally. No Battle sign ?Eyes: No eye injury. PERRL. No raccoon eyes ?Nose: Nontender. No epistaxis. No rhinorrhea ?Mouth/Throat: Mucous membranes are moist. No oropharyngeal blood. No dental injury. Airway patent without stridor. Normal voice. ?Neck: no C-collar. No midline c-spine tenderness.  ?Cardiovascular: Tachycardic with irregular rhythm  ?pulmonary/Chest: Chest wall is stable and nontender to palpation/compression. Normal respiratory effort. Breath sounds are normal. No crepitus.  ?Abdominal: Soft, nontender, non  distended. ?Musculoskeletal: Nontender with normal full range of motion in all extremities. No deformities. No thoracic or lumbar midline spinal tenderness. Pelvis is stable. ?Skin: Skin is warm, dry and intact. No abrasions or contutions. ?Psychiatric: Speech and behavior are appropriate. ?Neurological: Normal speech and language. Moves all extremities to command. No gross focal neurologic deficits are appreciated. ? ?Glascow Coma Score: ?4 - Opens eyes on own ?6 - Follows simple motor commands ?5 - Alert and oriented ?GCS: 15 ? ? ?ED Results / Procedures / Treatments  ? ?Labs ?(all labs ordered are listed, but only abnormal results are displayed) ?Labs Reviewed  ?BASIC METABOLIC PANEL - Abnormal; Notable for the following components:  ?    Result Value  ? Sodium 132 (*)   ?  Potassium 2.9 (*)   ? Glucose, Bld 105 (*)   ? BUN 24 (*)   ? All other components within normal limits  ?CBC - Abnormal; Notable for the following components:  ? WBC 12.5 (*)   ? RBC 5.17 (*)   ? Platelets 513 (*)   ? All other components within normal limits  ?MAGNESIUM  ?URINALYSIS, ROUTINE W REFLEX MICROSCOPIC  ? ? ? ?EKG ? ?ED ECG REPORT ?I, Rudene Re, the attending physician, personally viewed and interpreted this ECG. ? ?Afib, rate 144, no STE or depression.  New when compared to prior. ? ?RADIOLOGY ?I, Rudene Re, attending MD, have personally viewed and interpreted the images obtained during this visit as below: ? ?Chest x-ray concerning for left seventh rib fracture and questionable pneumonia ? ?CT of the chest confirms 1 rib fracture with atelectasis but no pneumonia ? ?CT head and cervical spine with no acute traumatic injury ? ?CTA chest neg for PE ?___________________________________________________ ?Interpretation by Radiologist:  ?DG Ribs Unilateral W/Chest Left ? ?Result Date: 06/27/2021 ?CLINICAL DATA:  Trauma, fall EXAM: LEFT RIBS AND CHEST - 3+ VIEW COMPARISON:  None. FINDINGS: There is minimally displaced  recent fracture in the lateral aspect of left seventh rib. There is no focal contusion. There are linear densities in the left lower lung fields, more so in the medial aspect. There is no pleural effusion or p

## 2021-06-27 NOTE — Assessment & Plan Note (Addendum)
PT eval.

## 2021-06-27 NOTE — H&P (Deleted)
?History and Physical  ? ? ?Patient: April Larsen OMB:[id number] DOB: [birth year] ?DOA: 06/27/2021 ?DOS: the patient was seen and examined on 06/28/2021 ?PCP: Rusty Aus, MD  ?Patient coming from: Home ? ?Chief Complaint:  ?Chief Complaint  ?Patient presents with  ? Fall  ? ?HPI: April Larsen is a 76 y.o. female with medical history significant of anemia, diverticulitis, ulcerative colitis. ? ?Pt says her chest pain. ?Pt had surgery in December for her scoliosis. ?Got home jan 3rd, 2023 from inpatient rehab.  ?And fell 3/18 - backwards on cement and had concussion and hit her head. Vertigo. ?D/c home and fallen 2 x since the discharge.  ?Today fell this am when she got up and lost balance and tripped n / ?Not her head / chest and ribs on left side.  ?Left 7th rib.  ?No pneumothorax.  ?Chest pain since 5 year  average.  ?It has happened intermittently randomly.  ?Pt sits down and rest and wet wash cloth ?Has associated nausea and diaphoresis and hot. ?Pt has since then also has cardiac clearance for her back surgery in December.  ?Pt saw dr Nehemiah Massed in summer 2022.  ?Pt has UC and sees Gi - Dr. Marius Ditch.  ? ?Review of Systems: Review of Systems  ?Respiratory:  Positive for sputum production.   ?Cardiovascular:  Positive for chest pain.  ?Musculoskeletal:  Positive for back pain.  ? ?Past Medical History:  ?Diagnosis Date  ? Actinic keratosis   ? Anemia   ? LAST HGB 15.2 ON 04-22-16  ? Anxiety   ? Arthritis   ? Cancer [facility]) 2016  ? skin  ? Cataract (lens) fragments in eye following cataract surgery, bilateral 2013  ? one eye done then 4 weeks later the other eye done.  ? Chronic ulcerative colitis, without complications ([place]) 07/02/3644  ? Diverticulitis   ? Esophagitis, reflux 10/04/2014  ? Family history of adverse reaction to anesthesia   ? SISTER HARD TO WAKE UP and nausea and vomiting.  ? Female stress incontinence 08/09/2013  ? Gastroesophageal reflux disease without esophagitis 08/06/2016  ? GERD (gastroesophageal  reflux disease)   ? Headache   ? MIGRAINE  ? History of hiatal hernia   ? Incomplete emptying of bladder 10/29/2013  ? Migraine 07/26/2013  ? PONV (postoperative nausea and vomiting)   ? nausea, does not remember vomiting.  ? Postherpetic neuralgia   ? RIGHT 7TH.CRANIAL NERVE from shingles followed by Bell's palsy  ? Postmenopausal 11/09/2016  ? PVC (premature ventricular contraction)   ? Scoliosis   ? Scoliosis (and kyphoscoliosis), idiopathic 07/26/2013  ? Scoliosis of lumbosacral spine 01/18/2015  ? Squamous cell carcinoma of skin 03/13/2013  ? L clavicle   ? Squamous cell carcinoma of skin 03/10/2018  ? L med knee  ? Squamous cell carcinoma of skin 02/21/2019  ? R cheek   ? Squamous cell carcinoma of skin 06/01/2019  ? L mid lat pretibial - ED&C  ? Trochanteric bursitis 11/29/2014  ? Ulcerative colitis ([place])   ? Urethral prolapse 08/09/2013  ? Urge incontinence 08/09/2013  ? ?Past Surgical History:  ?Procedure Laterality Date  ? ABDOMINAL HYSTERECTOMY    ? BACK SURGERY    ? L3-L4 AND L3-S1 DECOMPRESSION  ? CARPAL TUNNEL RELEASE    ? CHOLECYSTECTOMY N/A 08/21/2016  ? Procedure: LAPAROSCOPIC CHOLECYSTECTOMY WITH INTRAOPERATIVE CHOLANGIOGRAM;  Surgeon: Leonie Green, MD;  Location: [facility] ORS;  Service: General;  Laterality: N/A;  ? COLON SURGERY    ? COLONOSCOPY  11/06/2005  ? COLONOSCOPY WITH PROPOFOL N/A 07/29/2016  ? Procedure: COLONOSCOPY WITH PROPOFOL;  Surgeon: Manya Silvas, MD;  Location[facility] ENDOSCOPY;  Service: Endoscopy;  Laterality: N/A;  ? COLONOSCOPY WITH PROPOFOL N/A 08/22/2020  ? Procedure: COLONOSCOPY WITH PROPOFOL;  Surgeon: Virgel Manifold, MD;  Location: [facility] ENDOSCOPY;  Service: Endoscopy;  Laterality: N/A;  ? ESOPHAGOGASTRODUODENOSCOPY  11/06/2005  ? ESOPHAGOGASTRODUODENOSCOPY (EGD) WITH PROPOFOL N/A 07/29/2016  ? Procedure: ESOPHAGOGASTRODUODENOSCOPY (EGD) WITH PROPOFOL;  Surgeon: Manya Silvas, MD;  Location: [facility] ENDOSCOPY;  Service: Endoscopy;  Laterality: N/A;  ?  ESOPHAGOGASTRODUODENOSCOPY (EGD) WITH PROPOFOL N/A 02/07/2020  ? Procedure: ESOPHAGOGASTRODUODENOSCOPY (EGD) WITH PROPOFOL;  Surgeon: Virgel Manifold, MD;  Location: [facility] ENDOSCOPY;  Service: Endoscopy;  Laterality: N/A;  ? LAPAROSCOPIC LOW ANTERIOR RESECTION  11/24/2016  ? Procedure: LAPAROSCOPIC LOW ANTERIOR RESECTION;  Surgeon: Jules Husbands, MD;  Location: [facility] ORS;  Service: General;;  ? XI ROBOTIC ASSISTED PARAESOPHAGEAL HERNIA REPAIR N/A 02/27/2020  ? Procedure: XI ROBOTIC ASSISTED PARAESOPHAGEAL HERNIA REPAIR;  Surgeon: Jules Husbands, MD;  Location: [facility] ORS;  Service: General;  Laterality: N/A;  ? ?Social History:  reports that she has never smoked. She has never used smokeless tobacco. She reports that she does not drink alcohol and does not use drugs. ? ?Allergies  ?Allergen Reactions  ? Nsaids Other (See Comments)  ?  Patient has had part of her colon removed   ? Sulfa Antibiotics Rash  ? ? ?Family History  ?Problem Relation Age of Onset  ? Breast cancer Sister 36  ? Breast cancer Paternal Aunt 26  ? Breast cancer Cousin   ?     1st paternal cousin  ? Bladder Cancer Neg Hx   ? Prostate cancer Neg Hx   ? Kidney cancer Neg Hx   ? ? ?Prior to Admission medications   ?Medication Sig Start Date End Date Taking? Authorizing Provider  ?acetaminophen (TYLENOL) 500 MG tablet Take 1,000 mg by mouth every 6 (six) hours as needed for moderate pain or headache.     [provider]  ?B Complex-C (B-COMPLEX WITH VITAMIN C) tablet Take 1 tablet by mouth daily.    [provider]  ?buPROPion (WELLBUTRIN XL) 150 MG 24 hr tablet Take 150 mg by mouth daily. 12/08/18   [provider]  ?Calcium Carbonate-Vitamin D (CALCIUM 600+D PO) Take 1 tablet by mouth 2 (two) times daily.    [provider]  ?Cholecalciferol (VITAMIN D3) 50 MCG (2000 UT) TABS Take 2,000 Units by mouth daily.    [provider]  ?diazepam (VALIUM) 2 MG tablet Take 1 tablet (2 mg total) by mouth every 8  (eight) hours as needed for up to 14 days (dizziness). 06/23/21 07/07/21  Sidney Ace, MD  ?DULoxetine (CYMBALTA) 60 MG capsule Take 60 mg by mouth at bedtime.  06/17/15   [provider]  ?furosemide (LASIX) 20 MG tablet Take 20 mg by mouth daily as needed for fluid or edema.    [provider]  ?gabapentin (NEURONTIN) 300 MG capsule Take 300 mg by mouth 3 (three) times daily as needed (pain).    [provider]  ?meclizine (ANTIVERT) 25 MG tablet Take 1 tablet (25 mg total) by mouth 3 (three) times daily as needed for dizziness or nausea. 06/23/21   Sidney Ace, MD  ?mesalamine (APRISO) 0.375 g 24 hr capsule Take 4 capsules (1.5 g total) by mouth daily. 10/17/20   Virgel Manifold, MD  ?metoCLOPramide (REGLAN) 10  MG tablet Take 1 tablet (10 mg total) by mouth 3 (three) times daily before meals for 14 days. 06/23/21 07/07/21  Sidney Ace, MD  ?pantoprazole (PROTONIX) 40 MG tablet Take 40 mg by mouth daily.    [provider]  ? ? ?Physical Exam: ?Vitals:  ? 06/27/21 1845 06/27/21 2015 06/27/21 2030 06/27/21 2200  ?BP:    132/76  ?Pulse: (!) 37  78 (!) 115  ?Resp: 14   13  ?Temp:    97.9 ?F (36.6 ?C)  ?TempSrc:      ?SpO2: (!) 82% 98%  99%  ?Weight:      ?Height:      ?Physical Exam ?Vitals and nursing note reviewed.  ?Constitutional:   ?   General: She is not in acute distress. ?   Appearance: Normal appearance. She is not ill-appearing, toxic-appearing or diaphoretic.  ?HENT:  ?   Head: Normocephalic and atraumatic.  ?   Right Ear: Hearing and external ear normal.  ?   Left Ear: Hearing and external ear normal.  ?   Nose: Nose normal. No nasal deformity.  ?   Mouth/Throat:  ?   Lips: Pink.  ?   Mouth: Mucous membranes are moist.  ?   Tongue: No lesions.  ?   Pharynx: Oropharynx is clear.  ?Eyes:  ?   Extraocular Movements: Extraocular movements intact.  ?   Pupils: Pupils are equal, round, and reactive to light.  ?Neck:  ?   Vascular: No carotid bruit.   ?Cardiovascular:  ?   Rate and Rhythm: Normal rate and regular rhythm.  ?   Pulses: Normal pulses.  ?   Heart sounds: Normal heart sounds.  ?Pulmonary:  ?   Effort: Pulmonary effort is normal.  ?   Breath sounds: Normal breath soun

## 2021-06-27 NOTE — Hospital Course (Addendum)
April Larsen is a 76 y.o. F with HTN, depression, ulcerative colitis on [place] and chronic low back pain who presented with falls again. ? ?She was admitted on 3/19-3/27 for falls and discharged to home with home health services.  [place] twice after discharge and so returned to the hospital.  Work-up here revealed hyperthyroidism.  Thyroid ultrasound 4/2 no acute abnormalities.  Started on methimazole 5 mg twice daily and low-dose atenolol 12.5 mg daily. ?  ?

## 2021-06-28 ENCOUNTER — Observation Stay
Admit: 2021-06-28 | Discharge: 2021-06-28 | Disposition: A | Discharge status: Home / Self Care | Payer: PPO | Attending: Internal Medicine | Admitting: Internal Medicine

## 2021-06-28 ENCOUNTER — Encounter: Payer: Self-pay | Admitting: Internal Medicine

## 2021-06-28 DIAGNOSIS — R072 Precordial pain: Secondary | ICD-10-CM

## 2021-06-28 DIAGNOSIS — R55 Syncope and collapse: Secondary | ICD-10-CM | POA: Diagnosis not present

## 2021-06-28 DIAGNOSIS — R079 Chest pain, unspecified: Secondary | ICD-10-CM | POA: Diagnosis present

## 2021-06-28 DIAGNOSIS — W19XXXA Unspecified fall, initial encounter: Secondary | ICD-10-CM | POA: Diagnosis not present

## 2021-06-28 DIAGNOSIS — I10 Essential (primary) hypertension: Secondary | ICD-10-CM | POA: Diagnosis not present

## 2021-06-28 LAB — COMPREHENSIVE METABOLIC PANEL
ALT: 18 U/L (ref 0–44)
AST: 19 U/L (ref 15–41)
Albumin: 3.2 g/dL — ABNORMAL LOW (ref 3.5–5.0)
Alkaline Phosphatase: 107 U/L (ref 38–126)
Anion gap: 12 (ref 5–15)
BUN: 20 mg/dL (ref 8–23)
CO2: 22 mmol/L (ref 22–32)
Calcium: 9.9 mg/dL (ref 8.9–10.3)
Chloride: 101 mmol/L (ref 98–111)
Creatinine, Ser: 0.71 mg/dL (ref 0.44–1.00)
GFR, Estimated: >60 mL/min (ref >60)
Glucose, Bld: 118 mg/dL — ABNORMAL HIGH (ref 70–99)
Potassium: 3.3 mmol/L — ABNORMAL LOW (ref 3.5–5.1)
Sodium: 135 mmol/L (ref 135–145)
Total Bilirubin: 0.6 mg/dL (ref 0.3–1.2)
Total Protein: 6.4 g/dL — ABNORMAL LOW (ref 6.5–8.1)

## 2021-06-28 LAB — TROPONIN I (HIGH SENSITIVITY)
Troponin I (High Sensitivity): 8 ng/L (ref <18)
Troponin I (High Sensitivity): 7 ng/L (ref <18)

## 2021-06-28 LAB — CBC
HCT: 37.2 % (ref 36.0–46.0)
Hemoglobin: 12.4 g/dL (ref 12.0–15.0)
MCH: 28.1 pg (ref 26.0–34.0)
MCHC: 33.3 g/dL (ref 30.0–36.0)
MCV: 84.4 fL (ref 80.0–100.0)
Platelets: 407 10*3/uL — ABNORMAL HIGH (ref 150–400)
RBC: 4.41 MIL/uL (ref 3.87–5.11)
RDW: 14.4 % (ref 11.5–15.5)
WBC: 8.3 10*3/uL (ref 4.0–10.5)
nRBC: 0 % (ref 0.0–0.2)

## 2021-06-28 LAB — MAGNESIUM: Magnesium: 2.6 mg/dL — ABNORMAL HIGH (ref 1.7–2.4)

## 2021-06-28 LAB — ECHOCARDIOGRAM COMPLETE BUBBLE STUDY
Area-P 1/2: 8.16 cm2
S' Lateral: 3.25 cm

## 2021-06-28 MED ORDER — NITROGLYCERIN 0.4 MG SL SUBL: 0.4000 mg | SUBLINGUAL_TABLET | SUBLINGUAL | Status: DC | PRN

## 2021-06-28 MED ORDER — MORPHINE SULFATE (PF) 2 MG/ML IV SOLN
1.0000 mg | INTRAVENOUS | Status: DC | PRN
  Administered 2021-06-30: 1 mg via INTRAVENOUS
  Filled 2021-06-28: qty 1

## 2021-06-28 MED ORDER — PANTOPRAZOLE SODIUM 40 MG PO TBEC
40.0000 mg | DELAYED_RELEASE_TABLET | Freq: Every day | ORAL | Status: DC
Start: 2021-06-28 — End: 2021-07-02
  Administered 2021-06-28 – 2021-07-02 (×5): 40 mg via ORAL
  Filled 2021-06-28 (×5): qty 1

## 2021-06-28 MED ORDER — POTASSIUM CHLORIDE CRYS ER 20 MEQ PO TBCR
40.0000 meq | EXTENDED_RELEASE_TABLET | Freq: Once | ORAL | Status: AC
  Administered 2021-06-28: 40 meq via ORAL
  Filled 2021-06-28: qty 2

## 2021-06-28 NOTE — TOC Progression Note (Signed)
Transition of Care (TOC) - Progression Note  ? ? ?Patient Details  ?Name: April Larsen ?MRN: [id number] ?Date of Birth: [birth year] ? ?Transition of Care (TOC) CM/SW Contact  ?Izola Price, RN ?Phone Number: ?06/28/2021, 1:02 PM ? ?Clinical Narrative:06/28/21:  Patient admitted To [facility] 06/27/21 after experiencing a fall associated with some persistent nausea and vertigo/dizziness. CM will attempt to summarize last few months via provider/ED/therapy/prior CM notes in order to plan for a safe discharge.  ? ?Surgery in December 2022 for idiopathic severe scoliosis. Discharged to inpatient rehab at [facility] in [place] and discharged from there 04/01/21.  ? ?Recent admission to [facility] on 06/15/21 to 06/23/21 for backwards fall down stairs with occipital area concussion.  ? ?At discharge on 06/23/21, was referred to outpatient therapy via [facility] with noted vestibular therapy added on order.   ? ?Evidently, patient fell again two times since 06/23/21 discharge and admitted again 06/27/21.  ? ?Fall on day of this current admission where  patient hit her head again, this time on a dresser, and her left ribs on walker that was in use when patient fell.  ? ?Patient has had intermittent persistent nausea/dizziness since surgery according to compilation of provider/ED/therapy notes.  ? ?Per daughter, in ED provider notes on 3/31 presentation, patient had not been doing well at home, not able to stand up without assistance, has had to have family with her 24/7, and has been slight confused. This confusion was reported as new onset from family.  ? ?PT evaluations prior to discharge on 06/23/21 indicated some improvement in ability to ambulate with RW with SUPERVISION/ASSISTANCE with significant fatigue.  ? ?Pt evaluation 06/28/21 describes generalized weakness, increase HR with activity, significant fatigue, supervision needed for bathroom needs, and sit to supine. Improvement in distance ambulated documented from  prior admission.  ? ?Daughter had stated in earlier notes that patient lives with son, who has Down's Syndrome, and patient needs to be able to get OOB or chair and to bathroom without supervision.  ? ?STR/SNF was initially recommended with two bed offers refused by daughter.  ? ?[facility] declined patient readmission on 06/20/21 per prior CM notes, stating therapy provided was too much for patient to tolerate, but patient has improved since that time and may be worth addressing again, and is daughter preference.  ? ?Currently, without 24 hour supervision or continuous availability of intermittent supervision/assistance in the home for ADL's and safety/fall/vestibular issues, it appears to RN CM patient is unsafe for discharge to home setting without further investigation or continued improvement via therapy.   ? ?**TOC will attempt to revisit with daughter on at home supervision capabilities, revisit STR/SNF facilities that were declined initially, and iniitat contact with prior rehab center. Simmie Davies RN CM  ? ? ? ? ?  ?  ? ?Expected Discharge Plan and Services ?  ?  ?  ?  ?  ?                ?  ?  ?  ?  ?  ?  ?  ?  ?  ?  ? ? ?Social Determinants of Health (SDOH) Interventions ?  ? ?Readmission Risk Interventions ?   ? View : No data to display.  ?  ?  ?  ? ? ?

## 2021-06-28 NOTE — Progress Notes (Signed)
*  PRELIMINARY RESULTS* ?Echocardiogram ?2D Echocardiogram with bubble study has been performed. ? ?Joelene Millin ?06/28/2021, 3:10 PM ?

## 2021-06-28 NOTE — Progress Notes (Addendum)
Patient has been a Yellow MEWS for the majority of the previous shift and is still a Yellow MEWS at baseline at the start of my shift. ?

## 2021-06-28 NOTE — H&P (Signed)
?History and Physical  ? ? ?Patient: April Larsen JSH:[id number] DOB: 01/23/1946 ?DOA: 06/27/2021 ?DOS: the patient was seen and examined on 06/28/2021 ?PCP: Rusty Aus, MD  ?Patient coming from: Home ? ?Chief Complaint:  ?Chief Complaint  ?Patient presents with  ? Fall  ? ?HPI: April Larsen is a 76 y.o. female with medical history significant of anemia, diverticulitis, ulcerative colitis. ? ?Pt says her chest pain. ?Pt had surgery in December for her scoliosis. ?Got home jan 3rd, 2023 from inpatient rehab.  ?And fell 3/18 - backwards on cement and had concussion and hit her head. Vertigo. ?D/c home and fallen 2 x since the discharge.  ?Today fell this am when she got up and lost balance and tripped n / ?Not her head / chest and ribs on left side.  ?Left 7th rib.  ?No pneumothorax.  ?Chest pain since 5 year  average.  ?It has happened intermittently randomly.  ?Pt sits down and rest and wet wash cloth ?Has associated nausea and diaphoresis and hot. ?Pt has since then also has cardiac clearance for her back surgery in December.  ?Pt saw dr Nehemiah Massed in summer 2022.  ?Pt has UC and sees Gi - Dr. Marius Ditch.  ? ?Review of Systems: Review of Systems  ?Respiratory:  Positive for sputum production.   ?Cardiovascular:  Positive for chest pain.  ?Musculoskeletal:  Positive for back pain.  ? ?Past Medical History:  ?Diagnosis Date  ? Actinic keratosis   ? Anemia   ? LAST HGB 15.2 ON 04-22-16  ? Anxiety   ? Arthritis   ? Cancer [facility]) 2016  ? skin  ? Cataract (lens) fragments in eye following cataract surgery, bilateral 2013  ? one eye done then 4 weeks later the other eye done.  ? Chronic ulcerative colitis, without complications ([place]) 11/02/275  ? Diverticulitis   ? Esophagitis, reflux 10/04/2014  ? Family history of adverse reaction to anesthesia   ? SISTER HARD TO WAKE UP and nausea and vomiting.  ? Female stress incontinence 08/09/2013  ? Gastroesophageal reflux disease without esophagitis 08/06/2016  ? GERD (gastroesophageal  reflux disease)   ? Headache   ? MIGRAINE  ? History of hiatal hernia   ? Incomplete emptying of bladder 10/29/2013  ? Migraine 07/26/2013  ? PONV (postoperative nausea and vomiting)   ? nausea, does not remember vomiting.  ? Postherpetic neuralgia   ? RIGHT 7TH.CRANIAL NERVE from shingles followed by Bell's palsy  ? Postmenopausal 11/09/2016  ? PVC (premature ventricular contraction)   ? Scoliosis   ? Scoliosis (and kyphoscoliosis), idiopathic 07/26/2013  ? Scoliosis of lumbosacral spine 01/18/2015  ? Squamous cell carcinoma of skin 03/13/2013  ? L clavicle   ? Squamous cell carcinoma of skin 03/10/2018  ? L med knee  ? Squamous cell carcinoma of skin 02/21/2019  ? R cheek   ? Squamous cell carcinoma of skin 06/01/2019  ? L mid lat pretibial - ED&C  ? Trochanteric bursitis 11/29/2014  ? Ulcerative colitis ([place])   ? Urethral prolapse 08/09/2013  ? Urge incontinence 08/09/2013  ? ?Past Surgical History:  ?Procedure Laterality Date  ? ABDOMINAL HYSTERECTOMY    ? BACK SURGERY    ? L3-L4 AND L3-S1 DECOMPRESSION  ? CARPAL TUNNEL RELEASE    ? CHOLECYSTECTOMY N/A 08/21/2016  ? Procedure: LAPAROSCOPIC CHOLECYSTECTOMY WITH INTRAOPERATIVE CHOLANGIOGRAM;  Surgeon: Leonie Green, MD;  Location: [facility] ORS;  Service: General;  Laterality: N/A;  ? COLON SURGERY    ? COLONOSCOPY  11/06/2005  ? COLONOSCOPY WITH PROPOFOL N/A 07/29/2016  ? Procedure: COLONOSCOPY WITH PROPOFOL;  Surgeon: Manya Silvas, MD;  Location: [facility] ENDOSCOPY;  Service: Endoscopy;  Laterality: N/A;  ? COLONOSCOPY WITH PROPOFOL N/A 08/22/2020  ? Procedure: COLONOSCOPY WITH PROPOFOL;  Surgeon: Virgel Manifold, MD;  Location: [facility] ENDOSCOPY;  Service: Endoscopy;  Laterality: N/A;  ? ESOPHAGOGASTRODUODENOSCOPY  11/06/2005  ? ESOPHAGOGASTRODUODENOSCOPY (EGD) WITH PROPOFOL N/A 07/29/2016  ? Procedure: ESOPHAGOGASTRODUODENOSCOPY (EGD) WITH PROPOFOL;  Surgeon: Manya Silvas, MD;  Location: [facility] ENDOSCOPY;  Service: Endoscopy;  Laterality: N/A;  ?  ESOPHAGOGASTRODUODENOSCOPY (EGD) WITH PROPOFOL N/A 02/07/2020  ? Procedure: ESOPHAGOGASTRODUODENOSCOPY (EGD) WITH PROPOFOL;  Surgeon: Virgel Manifold, MD;  Location: [facility] ENDOSCOPY;  Service: Endoscopy;  Laterality: N/A;  ? LAPAROSCOPIC LOW ANTERIOR RESECTION  11/24/2016  ? Procedure: LAPAROSCOPIC LOW ANTERIOR RESECTION;  Surgeon: Jules Husbands, MD;  Location: [facility] ORS;  Service: General;;  ? XI ROBOTIC ASSISTED PARAESOPHAGEAL HERNIA REPAIR N/A 02/27/2020  ? Procedure: XI ROBOTIC ASSISTED PARAESOPHAGEAL HERNIA REPAIR;  Surgeon: Jules Husbands, MD;  Location: [facility] ORS;  Service: General;  Laterality: N/A;  ? ?Social History:  reports that she has never smoked. She has never used smokeless tobacco. She reports that she does not drink alcohol and does not use drugs. ? ?Allergies  ?Allergen Reactions  ? Nsaids Other (See Comments)  ?  Patient has had part of her colon removed   ? Sulfa Antibiotics Rash  ? ? ?Family History  ?Problem Relation Age of Onset  ? Breast cancer Sister 11  ? Breast cancer Paternal Aunt 76  ? Breast cancer Cousin   ?     1st paternal cousin  ? Bladder Cancer Neg Hx   ? Prostate cancer Neg Hx   ? Kidney cancer Neg Hx   ? ? ?Prior to Admission medications   ?Medication Sig Start Date End Date Taking? Authorizing Provider  ?acetaminophen (TYLENOL) 500 MG tablet Take 1,000 mg by mouth every 6 (six) hours as needed for moderate pain or headache.     [provider]  ?B Complex-C (B-COMPLEX WITH VITAMIN C) tablet Take 1 tablet by mouth daily.    [provider]  ?buPROPion (WELLBUTRIN XL) 150 MG 24 hr tablet Take 150 mg by mouth daily. 12/08/18   [provider]  ?Calcium Carbonate-Vitamin D (CALCIUM 600+D PO) Take 1 tablet by mouth 2 (two) times daily.    [provider]  ?Cholecalciferol (VITAMIN D3) 50 MCG (2000 UT) TABS Take 2,000 Units by mouth daily.    [provider]  ?diazepam (VALIUM) 2 MG tablet Take 1 tablet (2 mg total) by mouth every 8  (eight) hours as needed for up to 14 days (dizziness). 06/23/21 07/07/21  Sidney Ace, MD  ?DULoxetine (CYMBALTA) 60 MG capsule Take 60 mg by mouth at bedtime.  06/17/15   [provider]  ?furosemide (LASIX) 20 MG tablet Take 20 mg by mouth daily as needed for fluid or edema.    [provider]  ?gabapentin (NEURONTIN) 300 MG capsule Take 300 mg by mouth 3 (three) times daily as needed (pain).    [provider]  ?meclizine (ANTIVERT) 25 MG tablet Take 1 tablet (25 mg total) by mouth 3 (three) times daily as needed for dizziness or nausea. 06/23/21   Sidney Ace, MD  ?mesalamine (APRISO) 0.375 g 24 hr capsule Take 4 capsules (1.5 g total) by mouth daily. 10/17/20   Virgel Manifold, MD  ?metoCLOPramide (REGLAN) 10  MG tablet Take 1 tablet (10 mg total) by mouth 3 (three) times daily before meals for 14 days. 06/23/21 07/07/21  Sidney Ace, MD  ?pantoprazole (PROTONIX) 40 MG tablet Take 40 mg by mouth daily.    [provider]  ? ? ?Physical Exam: ?Vitals:  ? 06/27/21 2015 06/27/21 2030 06/27/21 2200 06/28/21 0045  ?BP:   132/76 134/73  ?Pulse:  78 (!) 115 (!) 116  ?Resp:   13 20  ?Temp:   97.9 ?F (36.6 ?C) 98.3 ?F (36.8 ?C)  ?TempSrc:      ?SpO2: 98%  99% 98%  ?Weight:      ?Height:      ?Physical Exam ?Vitals and nursing note reviewed.  ?Constitutional:   ?   General: She is not in acute distress. ?   Appearance: Normal appearance. She is not ill-appearing, toxic-appearing or diaphoretic.  ?HENT:  ?   Head: Normocephalic and atraumatic.  ?   Right Ear: Hearing and external ear normal.  ?   Left Ear: Hearing and external ear normal.  ?   Nose: Nose normal. No nasal deformity.  ?   Mouth/Throat:  ?   Lips: Pink.  ?   Mouth: Mucous membranes are moist.  ?   Tongue: No lesions.  ?   Pharynx: Oropharynx is clear.  ?Eyes:  ?   Extraocular Movements: Extraocular movements intact.  ?   Pupils: Pupils are equal, round, and reactive to light.  ?Neck:  ?   Vascular: No  carotid bruit.  ?Cardiovascular:  ?   Rate and Rhythm: Normal rate and regular rhythm.  ?   Pulses: Normal pulses.  ?   Heart sounds: Normal heart sounds.  ?Pulmonary:  ?   Effort: Pulmonary effort is normal.  ?   Breath sounds

## 2021-06-28 NOTE — Progress Notes (Signed)
April Larsen is a 76 y.o. female with medical history significant of anemia, diverticulitis, ulcerative colitis. ?  ?Pt says her chest pain. ?Pt had surgery in December for her scoliosis. ?Got home jan 3rd, 2023 from inpatient rehab.  ?And fell 3/18 - backwards on cement and had concussion and hit her head. Vertigo. ?D/c home and fallen 2 x since the discharge.  ?Today fell this am when she got up and lost balance and tripped n / ?Not her head / chest and ribs on left side.  ?Left 7th rib.  ?No pneumothorax.  ?Chest pain since 5 year  average.  ?It has happened intermittently randomly.  ?Pt sits down and rest and wet wash cloth ?Has associated nausea and diaphoresis and hot. ?Pt has since then also has cardiac clearance for her back surgery in December.  ?Pt saw dr Nehemiah Massed in summer 2022.  ?Pt has UC and sees Gi - Dr. Marius April Larsen.  ?  ? ?06/28/2021: Patient was seen and examined at bedside.  Endorses she feels very weak.  States is a generalized weakness that caused her to fall.  No dizziness prior to her fall, no loss of consciousness. ? ?Patient has requested to go to rehab [facility] rehab [place] consulted to assist with this request. ? ?Please refer to H&P dated by my partner Dr. Posey Pronto on 06/28/2021 for further details of the assessment and plan. ?

## 2021-06-28 NOTE — Assessment & Plan Note (Deleted)
?    Latest Ref Rng & Units 06/27/2021  ?  9:34 PM 06/27/2021  ?  3:24 PM 06/23/2021  ?  5:26 AM  ?CMP  ?Glucose 70 - 99 mg/dL  105     ?BUN 8 - 23 mg/dL  24     ?Creatinine 0.44 - 1.00 mg/dL 0.59   0.76   0.73    ?Sodium 135 - 145 mmol/L  132     ?Potassium 3.5 - 5.1 mmol/L  2.9     ?Chloride 98 - 111 mmol/L  98     ?CO2 22 - 32 mmol/L  22     ?Calcium 8.9 - 10.3 mg/dL  10.1     ? ?We will replace and follow levels.  ?

## 2021-06-28 NOTE — Progress Notes (Signed)
Patient is currently in pain. Tylenol 650 mg PRN offered after pain assessment ?

## 2021-06-28 NOTE — Evaluation (Signed)
Physical Therapy Evaluation ?Patient Details ?Name: April Larsen ?MRN: [id number] ?DOB: June 03, 1945 ?Today's Date: 06/28/2021 ? ?History of Present Illness ? Patient is a 76 year old female with a PMH (+) for anemia, diverticulitis, ulcerative colitis. Patient reported to [facility] ON 06/27/21 due to a fall. ?  ?Clinical Impression ? Physical Therapy Evaluation completed this date. Patient's chart reviewed, and PLOF/Home set up received from patient's recent admission. Upon arrival patient was in bathroom, with daughter present in room. Patient was able to demonstrate ability to perform all bathroom needs at supervision. Patient demonstrated generalized weakness, at least 3-3+/5 strength in BUEs and 3+-4-/5 strength in BLEs. Ambulation from bathroom>bed, and around the nurses station was completed at SBA/SUP with RW. HR increased to 145bpm with functional activity, and patient was guided back to her room. Upon return to room patient completed sit to supine at supervision and reported significant fatigue. Patient would benefit from continued skilled physical therapy in order to optimize patient's independence, safety, and strength with ADLs. Recommend HHPT.  ?   ? ?Recommendations for follow up therapy are one component of a multi-disciplinary discharge planning process, led by the attending physician.  Recommendations may be updated based on patient status, additional functional criteria and insurance authorization. ? ?Follow Up Recommendations Home health PT ? ?  ?Assistance Recommended at Discharge Intermittent Supervision/Assistance  ?Patient can return home with the following ? A little help with walking and/or transfers;A little help with bathing/dressing/bathroom;Assist for transportation;Help with stairs or ramp for entrance;Assistance with cooking/housework ? ?  ?Equipment Recommendations None recommended by PT (at this time)  ?Recommendations for Other Services ?    ?  ?Functional Status Assessment Patient has had a  recent decline in their functional status and demonstrates the ability to make significant improvements in function in a reasonable and predictable amount of time.  ? ?  ?Precautions / Restrictions Precautions ?Precautions: Fall ?Precaution Comments: recent spine fusion (12/22) ?Restrictions ?Weight Bearing Restrictions: No  ? ?  ? ?Mobility ? Bed Mobility ?Overal bed mobility: Needs Assistance ?Bed Mobility: Sit to Supine ?  ?  ?  ?Sit to supine: Supervision ?  ?  ?  ? ?Transfers ?Overall transfer level: Needs assistance ?Equipment used: Rolling walker (2 wheels) ?Transfers: Sit to/from Stand ?Sit to Stand: Supervision ?  ?  ?  ?  ?  ?General transfer comment: good recall on hand placement ?  ? ?Ambulation/Gait ?Ambulation/Gait assistance: Supervision (SBA) ?Gait Distance (Feet): 230 Feet ?Assistive device: Rolling walker (2 wheels) ?Gait Pattern/deviations: Step-to pattern, Decreased step length - right, Decreased step length - left, Decreased stride length ?Gait velocity: decreased ?  ?  ?General Gait Details: mild unsteadiness due to BLE weakness, however no LOB noted, patient fatigues easily ? ?Stairs ?  ?  ?  ?  ?  ? ?Wheelchair Mobility ?  ? ?Modified Rankin (Stroke Patients Only) ?  ? ?  ? ?Balance Overall balance assessment: Needs assistance ?Sitting-balance support: Feet supported, Bilateral upper extremity supported ?Sitting balance-Leahy Scale: Good ?  ?  ?Standing balance support: During functional activity, Reliant on assistive device for balance, Bilateral upper extremity supported ?Standing balance-Leahy Scale: Fair ?  ?  ?  ?  ?  ?  ?  ?  ?  ?  ?  ?  ?   ? ? ? ?Pertinent Vitals/Pain Pain Assessment ?Pain Assessment: 0-10 ?Pain Score: 5  ?Pain Location: L side rib, increases with deep breathing, coughing, laughing ?Pain Descriptors / Indicators: Aching ?Pain Intervention(s): Monitored  during session  ? ? ?Home Living Family/patient expects to be discharged to:: Private residence ?Living Arrangements:  Children ?Available Help at Discharge: Family ?Type of Home: House ?Home Access: Level entry ?  ?  ?  ?Home Layout: One level ?Home Equipment: Shower Land (2 wheels);BSC/3in1;Transport chair;Cane - single point ?Additional Comments: Son has special needs.  ?  ?Prior Function Prior Level of Function : Independent/Modified Independent ?  ?  ?  ?  ?  ?  ?Mobility Comments: Pt had gotten back to to being MOD I for functional mobility she reports using a SPC and being follow by OPPT at [facility] ?ADLs Comments: Pt endorses being MOD I For ADL management. Endorses multiple falls in last 6 months. ?  ? ? ?Hand Dominance  ? Dominant Hand: Right ? ?  ?Extremity/Trunk Assessment  ? Upper Extremity Assessment ?Upper Extremity Assessment: Generalized weakness (At least 3/5 grossly) ?  ? ?Lower Extremity Assessment ?Lower Extremity Assessment: Generalized weakness (at least 3+to4-/5 strength in BLEs) ?  ? ?   ?Communication  ? Communication: No difficulties  ?Cognition Arousal/Alertness: Awake/alert ?Behavior During Therapy: [facility] for tasks assessed/performed ?Overall Cognitive Status: Within Functional Limits for tasks assessed ?  ?  ?  ?  ?  ?  ?  ?  ?  ?  ?  ?  ?  ?  ?  ?  ?General Comments: A&Ox3- self location situation ?  ?  ? ?  ?General Comments General comments (skin integrity, edema, etc.): SpO2 97%, HR: ranged from 81 (at rest)-145bpm(with activity) ? ?  ?Exercises Other Exercises ?Other Exercises: patient educated on role of PT in acute care setting  ? ?Assessment/Plan  ?  ?PT Assessment Patient needs continued PT services  ?PT Problem List Decreased strength;Decreased mobility;Decreased range of motion;Decreased coordination;Decreased activity tolerance;Decreased balance;Decreased knowledge of use of DME;Pain ? ?   ?  ?PT Treatment Interventions DME instruction;Therapeutic exercise;Gait training;Balance training;Stair training;Neuromuscular re-education;Functional mobility training;Therapeutic  activities;Patient/family education   ? ?PT Goals (Current goals can be found in the Care Plan section)  ?Acute Rehab PT Goals ?Patient Stated Goal: to go to [place]hab to get stronger ?PT Goal Formulation: With patient/family ?Time For Goal Achievement: 07/12/21 ?Potential to Achieve Goals: Fair ? ?  ?Frequency Min 2X/week ?  ? ? ?Co-evaluation   ?  ?  ?  ?  ? ? ?  ?AM-PAC PT "6 Clicks" Mobility  ?Outcome Measure Help needed turning from your back to your side while in a flat bed without using bedrails?: A Little ?Help needed moving from lying on your back to sitting on the side of a flat bed without using bedrails?: A Little ?Help needed moving to and from a bed to a chair (including a wheelchair)?: A Little ?Help needed standing up from a chair using your arms (e.g., wheelchair or bedside chair)?: A Little ?Help needed to walk in hospital room?: A Little ?Help needed climbing 3-5 steps with a railing? : A Lot ?6 Click Score: 17 ? ?  ?End of Session Equipment Utilized During Treatment: Gait belt ?Activity Tolerance: Patient tolerated treatment well ?Patient left: in bed;with call bell/phone within reach;with bed alarm set;with family/visitor present ?Nurse Communication: Mobility status ?PT Visit Diagnosis: History of falling (Z91.81);Muscle weakness (generalized) (M62.81);Unsteadiness on feet (R26.81);Repeated falls (R29.6) ?  ? ?Time: 2947-6546 ?PT Time Calculation (min) (ACUTE ONLY): 36 min ? ? ?Charges:   PT Evaluation ?$PT Eval Low Complexity: 1 Low ?PT Treatments ?$Gait Training: 23-37 mins ?  ?   ? ? ?  Iva Boop, PT  ?06/28/21. 11:11 AM ? ? ?

## 2021-06-28 NOTE — Plan of Care (Signed)
?  Problem: Education: ?Goal: Knowledge of General Education information will improve ?Description: Including pain rating scale, medication(s)/side effects and non-pharmacologic comfort measures ?Outcome: Progressing ?  ?Problem: Clinical Measurements: ?Goal: Will remain free from infection ?Outcome: Progressing ?Goal: Cardiovascular complication will be avoided ?Outcome: Progressing ?  ?Problem: Elimination: ?Goal: Will not experience complications related to bowel motility ?Outcome: Progressing ?  ?

## 2021-06-28 NOTE — Assessment & Plan Note (Addendum)
-  Continue atenolol  ?

## 2021-06-29 ENCOUNTER — Observation Stay: Payer: PPO

## 2021-06-29 DIAGNOSIS — W19XXXA Unspecified fall, initial encounter: Secondary | ICD-10-CM | POA: Diagnosis not present

## 2021-06-29 DIAGNOSIS — I10 Essential (primary) hypertension: Secondary | ICD-10-CM | POA: Diagnosis not present

## 2021-06-29 DIAGNOSIS — E041 Nontoxic single thyroid nodule: Secondary | ICD-10-CM | POA: Diagnosis not present

## 2021-06-29 DIAGNOSIS — E0590 Thyrotoxicosis, unspecified without thyrotoxic crisis or storm: Secondary | ICD-10-CM | POA: Diagnosis not present

## 2021-06-29 LAB — BASIC METABOLIC PANEL
Anion gap: 7 (ref 5–15)
BUN: 15 mg/dL (ref 8–23)
CO2: 24 mmol/L (ref 22–32)
Calcium: 9.8 mg/dL (ref 8.9–10.3)
Chloride: 108 mmol/L (ref 98–111)
Creatinine, Ser: 0.52 mg/dL (ref 0.44–1.00)
GFR, Estimated: >60 mL/min (ref >60)
Glucose, Bld: 106 mg/dL — ABNORMAL HIGH (ref 70–99)
Potassium: 3.8 mmol/L (ref 3.5–5.1)
Sodium: 139 mmol/L (ref 135–145)

## 2021-06-29 LAB — PHOSPHORUS: Phosphorus: 3.6 mg/dL (ref 2.5–4.6)

## 2021-06-29 LAB — CBC
HCT: 36.2 % (ref 36.0–46.0)
Hemoglobin: 11.9 g/dL — ABNORMAL LOW (ref 12.0–15.0)
MCH: 28 pg (ref 26.0–34.0)
MCHC: 32.9 g/dL (ref 30.0–36.0)
MCV: 85.2 fL (ref 80.0–100.0)
Platelets: 403 10*3/uL — ABNORMAL HIGH (ref 150–400)
RBC: 4.25 MIL/uL (ref 3.87–5.11)
RDW: 14.6 % (ref 11.5–15.5)
WBC: 6.4 10*3/uL (ref 4.0–10.5)
nRBC: 0 % (ref 0.0–0.2)

## 2021-06-29 LAB — MAGNESIUM: Magnesium: 1.9 mg/dL (ref 1.7–2.4)

## 2021-06-29 LAB — T4, FREE: Free T4: 3.25 ng/dL — ABNORMAL HIGH (ref 0.61–1.12)

## 2021-06-29 LAB — VITAMIN B12: Vitamin B-12: 535 pg/mL (ref 180–914)

## 2021-06-29 MED ORDER — ENOXAPARIN SODIUM 40 MG/0.4ML IJ SOSY
40.0000 mg | PREFILLED_SYRINGE | INTRAMUSCULAR | Status: DC
  Administered 2021-06-29 – 2021-07-01 (×3): 40 mg via SUBCUTANEOUS
  Filled 2021-06-29 (×3): qty 0.4

## 2021-06-29 MED ORDER — VITAMIN D 25 MCG (1000 UNIT) PO TABS
2000.0000 [IU] | ORAL_TABLET | Freq: Every day | ORAL | Status: DC
  Administered 2021-06-29 – 2021-07-02 (×4): 2000 [IU] via ORAL
  Filled 2021-06-29 (×4): qty 2

## 2021-06-29 MED ORDER — MESALAMINE ER 250 MG PO CPCR
1500.0000 mg | ORAL_CAPSULE | Freq: Every day | ORAL | Status: DC
  Administered 2021-06-29 – 2021-07-02 (×4): 1500 mg via ORAL
  Filled 2021-06-29 (×4): qty 6

## 2021-06-29 MED ORDER — ATENOLOL 25 MG PO TABS
12.5000 mg | ORAL_TABLET | Freq: Every day | ORAL | Status: DC
  Administered 2021-06-29 – 2021-07-02 (×4): 12.5 mg via ORAL
  Filled 2021-06-29 (×4): qty 0.5

## 2021-06-29 MED ORDER — GABAPENTIN 300 MG PO CAPS
300.0000 mg | ORAL_CAPSULE | Freq: Three times a day (TID) | ORAL | Status: DC | PRN
  Administered 2021-06-29: 300 mg via ORAL
  Filled 2021-06-29: qty 1

## 2021-06-29 MED ORDER — OYSTER SHELL CALCIUM/D3 500-5 MG-MCG PO TABS
1.0000 | ORAL_TABLET | Freq: Two times a day (BID) | ORAL | Status: DC
Start: 2021-06-29 — End: 2021-07-02
  Administered 2021-06-29 – 2021-07-02 (×6): 1 via ORAL
  Filled 2021-06-29 (×6): qty 1

## 2021-06-29 MED ORDER — BUPROPION HCL ER (XL) 150 MG PO TB24
150.0000 mg | ORAL_TABLET | Freq: Every day | ORAL | Status: DC
  Administered 2021-06-29 – 2021-07-02 (×4): 150 mg via ORAL
  Filled 2021-06-29 (×4): qty 1

## 2021-06-29 MED ORDER — DULOXETINE HCL 30 MG PO CPEP
60.0000 mg | ORAL_CAPSULE | Freq: Every day | ORAL | Status: DC
  Administered 2021-06-29 – 2021-07-01 (×3): 60 mg via ORAL
  Filled 2021-06-29 (×3): qty 2

## 2021-06-29 MED ORDER — MECLIZINE HCL 25 MG PO TABS
25.0000 mg | ORAL_TABLET | Freq: Three times a day (TID) | ORAL | Status: DC | PRN
  Filled 2021-06-29: qty 1

## 2021-06-29 MED ORDER — METOCLOPRAMIDE HCL 10 MG PO TABS
10.0000 mg | ORAL_TABLET | Freq: Three times a day (TID) | ORAL | Status: DC
  Administered 2021-06-29 – 2021-07-02 (×9): 10 mg via ORAL
  Filled 2021-06-29 (×9): qty 1

## 2021-06-29 MED ORDER — METHIMAZOLE 5 MG PO TABS
5.0000 mg | ORAL_TABLET | Freq: Two times a day (BID) | ORAL | Status: DC
  Administered 2021-06-29 – 2021-07-02 (×7): 5 mg via ORAL
  Filled 2021-06-29 (×7): qty 1

## 2021-06-29 MED ORDER — DIAZEPAM 2 MG PO TABS
2.0000 mg | ORAL_TABLET | Freq: Three times a day (TID) | ORAL | Status: DC | PRN
  Administered 2021-06-30: 2 mg via ORAL
  Filled 2021-06-29: qty 1

## 2021-06-29 NOTE — Progress Notes (Signed)
Inpatient Rehab Admissions Coordinator:  ?Consult received. Note pt is under observation status at this time. Pt may not have the medical necessity to warrant an inpatient rehab stay if they remain observation. If status were to change to inpatient, admission coordinator team will assess for candidacy. Also note Pt is recommending [facility] therapy and OT is recommending SNF. ? ? ?Gayland Curry, MS, CCC-SLP ?Admissions Coordinator ?[phone] ? ?

## 2021-06-29 NOTE — Evaluation (Signed)
Occupational Therapy Evaluation ?Patient Details ?Name: April Larsen ?MRN: [id number] ?DOB: 07-18-1945 ?Today's Date: 06/29/2021 ? ? ?History of Present Illness Patient is a 76 year old female with a PMH (+) for anemia, diverticulitis, ulcerative colitis. Patient reported to [facility] ON 06/27/21 due to a fall. Pt was previously admitted3/19-3/27 following fall at home.  ? ?Clinical Impression ?  ?Chart reviewed, nurse cleared pt for participation in OT evaluation. Pt is known to this therapist from previous admission. Pt reports two falls since discharge, one occurring when she appears to have slid on the covers on her bed (pt reports they could have been too slick) onto her bottom, then the fall in which brought her to the hospital. Pt reports difficulties with transfers in/out of couch (too low).  While pt is able to ambulate with MOD I with RW household distances, pt requires step by step vcs for safe body mechanics during toileting task/clothing management. If therapist were not present to provide CGA-MIN A and step by step vcs pt was a risk for falling with technique she was attempting prior to therapist recommendations. Pt also presents with urinary urgency which presents as a fall risk as she reports she cannot get to the toilet fast enough at times. Pt educated to to put [facility] against wall near her to prevent any falls.  ? ?Pt daughter reports they had frequent/constant supervision following previous discharge. They are hopeful to return to [facility] acute inpatient rehab as they report pt was performing at similar level of function and improved significantly during her short rehab stay. At this time pt present with deficits in strength, endurance, activity tolerance, pain, all affecting safe and effective ADL completion. Pt would benefit from additional rehab to address functional deficits and to facilitate return to PLOF.  ?   ? ?Recommendations for follow up therapy are one component of a multi-disciplinary  discharge planning process, led by the attending physician.  Recommendations may be updated based on patient status, additional functional criteria and insurance authorization.  ? ?Follow Up Recommendations ? Skilled nursing-short term rehab (<3 hours/day)  ?  ?Assistance Recommended at Discharge Intermittent Supervision/Assistance  ?Patient can return home with the following Assistance with cooking/housework;Help with stairs or ramp for entrance;Assist for transportation;A little help with walking and/or transfers ? ?  ?Functional Status Assessment ? Patient has had a recent decline in their functional status and demonstrates the ability to make significant improvements in function in a reasonable and predictable amount of time.  ?Equipment Recommendations ? Tub/shower seat  ?  ?Recommendations for Other Services   ? ? ?  ?Precautions / Restrictions Precautions ?Precautions: Fall ?Precaution Comments: recent spine fusion (12/22) ?Restrictions ?Weight Bearing Restrictions: No  ? ?  ? ?Mobility Bed Mobility ?Overal bed mobility: Needs Assistance ?Bed Mobility: Supine to Sit ?  ?  ?Supine to sit: Min assist, HOB elevated ?  ?  ?  ?  ? ?Transfers ?Overall transfer level: Needs assistance ?Equipment used: Rolling walker (2 wheels) ?Transfers: Sit to/from Stand ?Sit to Stand: Supervision ?  ?  ?  ?  ?  ?General transfer comment: vcs for approrpiate and safe body mechanics ?  ? ?  ?Balance Overall balance assessment: Needs assistance ?Sitting-balance support: Feet supported, Bilateral upper extremity supported ?Sitting balance-Leahy Scale: Good ?  ?  ?Standing balance support: During functional activity, Reliant on assistive device for balance, Bilateral upper extremity supported ?Standing balance-Leahy Scale: Fair ?  ?  ?  ?  ?  ?  ?  ?  ?  ?  ?  ?  ?   ? ?  ADL either performed or assessed with clinical judgement  ? ?ADL Overall ADL's : Needs assistance/impaired ?Eating/Feeding: Set up;Sitting ?  ?Grooming: Wash/dry  hands;Wash/dry face;Sitting;Standing;Supervision/safety ?Grooming Details (indicate cue type and reason): standing at chair with RW ?  ?  ?Lower Body Bathing: Supervison/ safety;Min guard ?Lower Body Bathing Details (indicate cue type and reason): standing with RW at chair ?  ?  ?  ?  ?Toilet Transfer: Supervision/safety;Ambulation;Rolling walker (2 wheels) ?Toilet Transfer Details (indicate cue type and reason): simulated to bedside chair ?Toileting- Clothing Manipulation and Hygiene: Min guard;Minimal assistance;Cueing for safety;Cueing for sequencing ?Toileting - Clothing Manipulation Details (indicate cue type and reason): Pt required step by step verbal cues for safe removal and donning of new underwear (pull down in standing, sit with reaching backwards, remove off feet in sitting; donn to knees in sitting, approrpiate body mechanics to stand, then pull up with RW); ?  ?  ?Functional mobility during ADLs: Modified independent;Rolling walker (2 wheels) ?General ADL Comments: MOD I with RW approx 15 feet around room  ? ? ? ?Vision   ?Additional Comments: pt with no reported changes this admission  ?   ?Perception   ?  ?Praxis   ?  ? ?Pertinent Vitals/Pain Pain Assessment ?Pain Assessment: 0-10 ?Pain Score: 5  ?Pain Location: L rib ?Pain Descriptors / Indicators: Aching, Grimacing ?Pain Intervention(s): Limited activity within patient's tolerance, Monitored during session, Patient requesting pain meds-RN notified, Repositioned, Relaxation  ? ? ? ?Hand Dominance   ?  ?Extremity/Trunk Assessment Upper Extremity Assessment ?Upper Extremity Assessment: Generalized weakness ?  ?Lower Extremity Assessment ?Lower Extremity Assessment: Generalized weakness ?  ?Cervical / Trunk Assessment ?Cervical / Trunk Assessment: Normal ?  ?Communication Communication ?Communication: No difficulties ?  ?Cognition Arousal/Alertness: Awake/alert ?Behavior During Therapy: [facility] for tasks assessed/performed ?Overall Cognitive Status:  Within Functional Limits for tasks assessed ?  ?  ?  ?  ?  ?  ?  ?  ?  ?  ?  ?  ?  ?  ?  ?  ?General Comments: alert and oriented x4; fair safety awareness and carry over from previous admission for safe mobility and ADL task completion ?  ?  ?General Comments  HR up to 140 with activity, down to 112 with seated rest break. ? ?  ?Exercises Other Exercises ?Other Exercises: edu re: role of rehab (including levels of care), home safety, fall prevention, home modification for safe ADL completion ?  ?Shoulder Instructions    ? ? ?Home Living Family/patient expects to be discharged to:: Private residence ?Living Arrangements: Children ?Available Help at Discharge: Family ?Type of Home: House ?Home Access: Level entry ?  ?  ?Home Layout: One level ?  ?  ?Bathroom Shower/Tub: Tub/shower unit ?  ?Bathroom Toilet: Standard ?  ?  ?Home Equipment: Shower Land (2 wheels);BSC/3in1;Transport chair;Cane - single point ?  ?  ?  ? ?  ?Prior Functioning/Environment Prior Level of Function : Independent/Modified Independent;Other (comment) (see below) ?  ?  ?  ?  ?  ?  ?Mobility Comments: Pt reports MOD I for mobility with RW however pt with falls following discharge; ?ADLs Comments: Pt was previously MOD I for ADL tasks, supervision required after previous admission however pt with reported falls after transfers ?  ? ?  ?  ?OT Problem List:   ?  ?   ?OT Treatment/Interventions: Self-care/ADL training;Therapeutic exercise;Therapeutic activities;DME and/or AE instruction;Patient/family education;Balance training;Energy conservation;Neuromuscular education  ?  ?OT Goals(Current goals can be found in  the care plan section) Acute Rehab OT Goals ?Patient Stated Goal: get back to PLOF ?OT Goal Formulation: With patient ?Time For Goal Achievement: 07/13/21 ?Potential to Achieve Goals: Good ?ADL Goals ?Pt Will Perform Grooming: with modified independence;standing ?Pt Will Perform Upper Body Dressing: sitting;standing;with  modified independence ?Pt Will Perform Lower Body Dressing: with modified independence;sit to/from stand ?Pt Will Transfer to Toilet: with modified independence;regular height toilet;ambulating ?Pt Will Perform Toileting

## 2021-06-29 NOTE — TOC Progression Note (Addendum)
Transition of Care (TOC) - Progression Note  ? ? ?Patient Details  ?Name: April Larsen ?MRN: [id number] ?Date of Birth: [birth year] ? ?Transition of Care (TOC) CM/SW Contact  ?April Price, RN ?Phone Number: ?06/29/2021, 2:34 PM ? ?Clinical Narrative: 4/2: Attempted to reach daughter by phone. Left VM at 230 pm Sunday. April Davies RN CM    ? ?April Larsen (Daughter)  ?[phone] (Mobile) ? ?Update: Daughter returned call to discuss patient/plan.  ? ?She states patient is not having vertigo any longer and throughout process has been very motivated despite struggling with the vertigo/nausea that followed the fall backwards hitting back of her head.  ? ?PT notes indicated progress in patient endurance in ambulation and feel may be able to be re-evaluated for inpatient rehab at [facility] in [place] or CIR at [facility] is appropriate.  ? ?Patient was denied readmission to [facility] due to the vertigo issues per daughter, which are now resolved as of 06/28/21.  ? ?Discussed home situation again, son with high functioning Down's syndrome can assist in getting things for patient but can't help her get OOB to bathroom.  ? ?Between last admission, family have covered 24 hours supervision needs for safety but is not sustainable and patient is not safe at home without supervision/assistance just yet.  ? ?Also, patient had an issue with [facility] agency after back surgery not showing up for 4 weeks and only providing written exercises, nothing hands on.  ? ?Would like to be able to progress back to outpatient PT at [facility], the level of post acute care patient was at prior to fall on 06/14/21.  ? ?Explained TOC will follow up on weekday and RN CM will update provider/possible PT revaluation towards CIR potential.  ? ?April Davies RN CM  ? ?  ?  ? ?Expected Discharge Plan and Services ?  ?  ?  ?  ?  ?                ?  ?  ?  ?  ?  ?  ?  ?  ?  ?  ? ? ?Social Determinants of Health (SDOH) Interventions ?  ? ?Readmission  Risk Interventions ?   ? View : No data to display.  ?  ?  ?  ? ? ?

## 2021-06-29 NOTE — Progress Notes (Addendum)
? ?PROGRESS NOTE ? ?April Larsen:[id number] DOB: 1946-01-24 DOA: 06/27/2021 ?PCP: Rusty Aus, MD ? ?HPI/Recap of past 24 hours: ?BRYLIN STOPPER is a 76 y.o. female with medical history significant for diverticulitis, ulcerative colitis, BPPV, GERD, polyneuropathy, scoliosis status post back surgery in December 2022, chronic bilateral lower extremity edema, chronic anxiety/depression who presented from home to [facility] with complaints of recurrent falls.  No syncope.  No seizure-like activity.  No vertigo.  Endorses that she fell backwards on 06/14/2021 after losing her balance, hit her head on cement and had a concussion.  She was admitted on 06/15/2021 and discharged to home with home health services on 06/23/2021 from [facility].  States she fell twice after discharge.  On the morning of presentation she fell, got up and lost her balance tripped and fell again. ? ?Upon assessment, patient is very weak.  Work-up revealed newly diagnosed severe hyperthyroidism with nearly undetectable TSH < 0.010 and elevated free T4 3.25.  Thyroid ultrasound ordered.  Started on methimazole 5 mg twice daily and low-dose atenolol 12.5 mg daily. ? ?Patient has requested to go to inpatient rehab at [facility] or [facility] inpatient rehab.  TOC in CIR consulted to assist with this request. ?  ? ?06/29/2021: Patient was seen and examined at bedside.  Her daughter and her sister were present at bedside.  Generally weak appearing.  Uses a walker to ambulate.  She is optimistic about making improvement in the setting of inpatient rehab.  Stating it was very helpful to her after her back surgery.  She stayed for 10 days at [facility] inpatient rehab.  Denies vertigo. ? ? ?Assessment/Plan: ?Principal Problem: ?  Fall ?Active Problems: ?  Vertigo ?  Fall (on) (from) other stairs and steps 06/14/21, subsequent encounter ?  Chronic ulcerative colitis, without complications ([place]) ?  Essential hypertension ?   Hypokalemia ?  Chest pain ? ?Recurrent falls after losing her balance ?Continue PT OT with assistance and fall precautions ?Patient will benefit from inpatient rehab ?Patient has requested to go to inpatient rehab at [facility] or [facility] inpatient rehab.   ?TOC in CIR consulted to assist with this request. ? ?Generalized weakness secondary to severe hyperthyroidism ?Patient reported generalized muscle weakness which could also be caused by vitamin D deficiency- will check vitamin D level.  Update: Vitamin D 1,25 Dihydroxy level was low range of normal 25.4. ?Continue to treat underlying condition ?Continue PT OT with assistance and fall precautions ?Continue to encourage increase in oral protein calorie intake. ? ?Newly diagnosed hyperthyroidism  ?Nearly undetectable TSH < 0.010 and elevated free T4 3.25.  Thyroid ultrasound ordered 06/29/21, revealed normal-sized thyroid with no indication for biopsy or follow-up imaging.   ?Started on methimazole 5 mg twice daily and low-dose atenolol 12.5 mg daily. ?Will need endocrinology follow-up outpatient ? ?Sinus tachycardia secondary to hyperthyroidism ?Started on atenolol 12.5 mg daily on 06/29/2021 ?2D echo ordered on admission revealed LVEF 60 to 65% with no regional wall motion abnormalities.  Grade 1 diastolic dysfunction. ?Continue to monitor on telemetry ? ?History of vertigo/BPPV ?No acute issues ?Denies dizziness and vertigo at the time of this visit ? ?Chronic bilateral lower extremity edema ?Prior to admission on p.o. Lasix 20 mg daily as needed for lower extremity edema. ? ?Ulcerative colitis ?No acute issues ?Resume home regimen ? ?Chronic anxiety/depression ?Resume home regimen ? ?Polyneuropathy ?Resume home regimen. ? ?Critical care time: 65 minutes. ? ?  Code Status: Full code ? ?Family Communication: Updated daughter and sister at bedside. ? ?Disposition Plan:   ?Likely will discharge to  CIR. ? ?Consultants: ?CIR ?TOC ? ?Procedures: ?2D echo on 06/28/2021. ?Thyroid ultrasound on 06/29/2021. ? ?Antimicrobials: ?None. ? ?DVT prophylaxis: ?Subcu Lovenox daily. ? ?Status is: Observation. ? ?Patient requires at least 2 midnights for further evaluation and treatment of present condition.  Patient is not safe for discharge to home due to generalized weakness and increased risk for falls. ? ? ? ?Objective: ?Vitals:  ? 06/29/21 0203 06/29/21 0907 06/29/21 1338 06/29/21 1711  ?BP: 133/73 135/88 113/67 119/70  ?Pulse: (!) 113 (!) 110 82 82  ?Resp: 15 12 16 16   ?Temp: 97.7 ?F (36.5 ?C) 97.7 ?F (36.5 ?C) 97.8 ?F (36.6 ?C) 98 ?F (36.7 ?C)  ?TempSrc: Oral Oral Oral   ?SpO2: 98% 100% 99% 100%  ?Weight:      ?Height:      ? ? ?Intake/Output Summary (Last 24 hours) at 06/29/2021 1716 ?Last data filed at 06/29/2021 1400 ?Gross per 24 hour  ?Intake 240 ml  ?Output 700 ml  ?Net -460 ml  ? ?Filed Weights  ? 06/27/21 1511 06/28/21 0102  ?Weight: 68 kg 69.6 kg  ? ? ?Exam: ? ?General: 76 y.o. year-old female weak-appearing in no acute distress.  Alert and oriented x3. ?Cardiovascular: Regular rate and rhythm with no rubs or gallops.  No thyromegaly or JVD noted.   ?Respiratory: Clear to auscultation with no wheezes or rales. Good inspiratory effort. ?Abdomen: Soft nontender nondistended with normal bowel sounds x4 quadrants. ?Musculoskeletal: No lower extremity edema. 2/4 pulses in all 4 extremities. ?Skin: No ulcerative lesions noted or rashes, ?Psychiatry: Mood is appropriate for condition and setting ?Neuro: Alert and awake.  Nonfocal exam. ? ? ?Data Reviewed: ?CBC: ?Recent Labs  ?Lab 06/27/21 ?1524 06/27/21 ?2134 06/28/21 ?7209 06/29/21 ?4709  ?WBC 12.5* 11.1* 8.3 6.4  ?HGB 14.9 12.7 12.4 11.9*  ?HCT 43.7 38.5 37.2 36.2  ?MCV 84.5 85.2 84.4 85.2  ?PLT 513* 404* 407* 403*  ? ?Basic Metabolic Panel: ?Recent Labs  ?Lab 06/23/21 ?6283 06/27/21 ?1524 06/27/21 ?1718 06/27/21 ?2134 06/28/21 ?6629 06/29/21 ?4765  ?NA  --  132*  --   --   135 139  ?K  --  2.9*  --   --  3.3* 3.8  ?CL  --  98  --   --  101 108  ?CO2  --  22  --   --  22 24  ?GLUCOSE  --  105*  --   --  118* 106*  ?BUN  --  24*  --   --  20 15  ?CREATININE 0.73 0.76  --  0.59 0.71 0.52  ?CALCIUM  --  10.1  --   --  9.9 9.8  ?MG  --   --  1.7  --  2.6* 1.9  ?PHOS  --   --   --   --   --  3.6  ? ?GFR: ?Estimated Creatinine Clearance: 58.6 mL/min (by C-G formula based on SCr of 0.52 mg/dL). ?Liver Function Tests: ?Recent Labs  ?Lab 06/28/21 ?0218  ?AST 19  ?ALT 18  ?ALKPHOS 107  ?BILITOT 0.6  ?PROT 6.4*  ?ALBUMIN 3.2*  ? ?No results for input(s): LIPASE, AMYLASE in the last 168 hours. ?No results for input(s): AMMONIA in the last 168 hours. ?Coagulation Profile: ?No results for input(s): INR, PROTIME in the last 168 hours. ?Cardiac Enzymes: ?No results for input(s): CKTOTAL, CKMB,  CKMBINDEX, TROPONINI in the last 168 hours. ?BNP (last 3 results) ?No results for input(s): PROBNP in the last 8760 hours. ?HbA1C: ?No results for input(s): HGBA1C in the last 72 hours. ?CBG: ?No results for input(s): GLUCAP in the last 168 hours. ?Lipid Profile: ?No results for input(s): CHOL, HDL, LDLCALC, TRIG, CHOLHDL, LDLDIRECT in the last 72 hours. ?Thyroid Function Tests: ?Recent Labs  ?  06/27/21 ?2134 06/29/21 ?8182  ?TSH <0.010*  --   ?FREET4  --  3.25*  ? ?Anemia Panel: ?No results for input(s): VITAMINB12, FOLATE, FERRITIN, TIBC, IRON, RETICCTPCT in the last 72 hours. ?Urine analysis: ?   ?Component Value Date/Time  ? COLORURINE YELLOW (A) 06/16/2021 0037  ? APPEARANCEUR HAZY (A) 06/16/2021 0037  ? APPEARANCEUR Hazy (A) 12/20/2020 0841  ? LABSPEC 1.018 06/16/2021 0037  ? PHURINE 5.0 06/16/2021 0037  ? GLUCOSEU NEGATIVE 06/16/2021 0037  ? [place] NEGATIVE 06/16/2021 0037  ? [place] NEGATIVE 06/16/2021 0037  ? BILIRUBINUR Negative 12/20/2020 0841  ? KETONESUR 5 (A) 06/16/2021 0037  ? [place] NEGATIVE 06/16/2021 0037  ? NITRITE NEGATIVE 06/16/2021 0037  ? LEUKOCYTESUR TRACE (A) 06/16/2021 0037   ? ?Sepsis Labs: ?@[email or web address] (procalcitonin:4,lacticidven:4) ? ?) ?Recent Results (from the past 240 hour(s))  ?Resp Panel by RT-PCR (Flu A&B, Covid) Nasopharyngeal Swab     Status: None  ? Collection Time: 06/27/21  8:47 PM  ? Specimen: Nasopharyngea

## 2021-06-30 ENCOUNTER — Encounter: Payer: Self-pay | Admitting: Internal Medicine

## 2021-06-30 DIAGNOSIS — W19XXXA Unspecified fall, initial encounter: Secondary | ICD-10-CM | POA: Diagnosis not present

## 2021-06-30 DIAGNOSIS — E0590 Thyrotoxicosis, unspecified without thyrotoxic crisis or storm: Secondary | ICD-10-CM | POA: Diagnosis not present

## 2021-06-30 LAB — CALCITRIOL (1,25 DI-OH VIT D): Vit D, 1,25-Dihydroxy: 25.4 pg/mL (ref 24.8–81.5)

## 2021-06-30 MED ORDER — ENSURE ENLIVE PO LIQD
237.0000 mL | Freq: Two times a day (BID) | ORAL | Status: DC
  Administered 2021-07-01 – 2021-07-02 (×3): 237 mL via ORAL

## 2021-06-30 NOTE — TOC Progression Note (Signed)
Transition of Care (TOC) - Progression Note  ? ? ?Patient Details  ?Name: April Larsen ?MRN: [id number] ?Date of Birth: Jun 01, 1945 ? ?Transition of Care (TOC) CM/SW Contact  ?Alberteen Sam, LCSW ?Phone Number: ?06/30/2021, 10:25 AM ? ?Clinical Narrative:    ? ?Per CIR patient does not meet criteria for inpatient rehab and notes patient is in observation status.  ? ?CSW has lvm with [facility] in [place] at [phone] per patient/family request however doubtful verdict will be different than CIR.  ? ?CSW has lvm with patient's daughter [name] to review above and determine dispo of SNF vs HH.  ? ?Pending call back from daughter at this time.  ? ? ?  ?  ? ?Expected Discharge Plan and Services ?  ?  ?  ?  ?  ?                ?  ?  ?  ?  ?  ?  ?  ?  ?  ?  ? ? ?Social Determinants of Health (SDOH) Interventions ?  ? ?Readmission Risk Interventions ?   ? View : No data to display.  ?  ?  ?  ? ? ?

## 2021-06-30 NOTE — Progress Notes (Addendum)
Inpatient Rehab Admissions Coordinator:  ? ?CIR consult received. Pt.'s currently does not meet criteria for acute inpatient admission (she remains observation status) and does not demonstrate medical necessity for CIR admission. She is also ambulating and transferring at supervision level, indicating that she does not demonstrate functional deficits to necessitate the intensity of CIR.   Additionally, Pt.'s payor is very unlikely to approve an acute inpatient rehab stay at any venue. Case was also discussed with CIR's medical director who felt that Pt. Does not meet our program criteria and that her rehab needs could be met at a lower level of care.  I will not pursue CIR admission at this time. ? ?Per request of TOC, I spoke with Pt.'s daughter to review this decision.  She would like it noted that family does not agree with decision not to pursue CIR admission.  ? ?Clemens Catholic, MS, CCC-SLP ?Rehab Admissions Coordinator  ?[phone] (celll) ?[phone] (office) ? ?

## 2021-06-30 NOTE — Progress Notes (Signed)
? ?PROGRESS NOTE ? ?BEVELYN ARRIOLA QIW:[id number] DOB: Dec 12, 1945 DOA: 06/27/2021 ?PCP: Rusty Aus, MD ? ?HPI/Recap of past 24 hours: ?April Larsen is a 76 y.o. female with medical history significant for diverticulitis, ulcerative colitis, BPPV, GERD, polyneuropathy, scoliosis status post back surgery in December 2022, chronic bilateral lower extremity edema, chronic anxiety/depression who presented from home to [facility] with complaints of recurrent falls.  No syncope.  No seizure-like activity.  No vertigo.  Endorses that she fell backwards on 06/14/2021 after losing her balance, hit her head on cement and had a concussion.  She was admitted on 06/15/2021 and discharged to home with home health services on 06/23/2021 from [facility].  States she fell twice after discharge.  On the morning of presentation she fell, got up and lost her balance tripped and fell again. ? ?Upon assessment, patient is very weak.  Work-up revealed newly diagnosed severe hyperthyroidism with nearly undetectable TSH less than 0.010 and elevated free T4 3.25.  Thyroid ultrasound 4/2 no acute abnormalities.  Started on methimazole 5 mg twice daily and low-dose atenolol 12.5 mg daily. ? ?Patient has requested to go to inpatient rehab at [facility] or [facility] inpatient rehab.  TOC and CIR consulted to assist with this request.  After an extensive review, CIR concluded that the patient does not meet criteria for acute inpatient admission. ?  ? ?06/30/2021: Patient was seen at her bedside.  Still feels weak.  She is worried about going home and having recurrent falls.  Denies dizziness. ? ? ?Assessment/Plan: ?Principal Problem: ?  Fall ?Active Problems: ?  Vertigo ?  Fall (on) (from) other stairs and steps 06/14/21, subsequent encounter ?  Chronic ulcerative colitis, without complications ([place]) ?  Essential hypertension ?  Hypokalemia ?  Chest pain ? ?Recurrent falls after losing her balance ?Continue PT OT with assistance  and fall precautions ?Patient requested to go to inpatient rehab at [facility] or [facility] inpatient rehab.   ?Per CIR patient does not meet criteria for acute inpatient admission. ?Patient was again evaluated by PT today and was recommended home health PT ? ?Generalized weakness secondary to severe hyperthyroidism ?Continue to treat underlying condition ?Continue PT OT with assistance and fall precautions ?Continue to encourage increase in oral protein calorie intake. ?Add oral supplementation. ? ?Newly diagnosed hyperthyroidism  ?Nearly undetectable TSH < 0.010 and elevated free T4 3.25.  Thyroid ultrasound ordered 06/29/21, revealed normal-sized thyroid with no indication for biopsy or follow-up imaging.   ?Continue methimazole 5 mg twice daily and low-dose atenolol 12.5 mg daily. ?Will need endocrinology follow-up outpatient ? ?Resolved sinus tachycardia secondary to hyperthyroidism ?Started on atenolol 12.5 mg daily on 06/29/2021 ?2D echo ordered on admission revealed LVEF 60 to 65% with no regional wall motion abnormalities.  Grade 1 diastolic dysfunction. ?Continue to monitor on telemetry ? ?History of vertigo/BPPV ?No acute issues ?Denies dizziness and vertigo at the time of this visit ? ?Chronic bilateral lower extremity edema, improved. ?Prior to admission on p.o. Lasix 20 mg daily as needed for lower extremity edema. ? ?Ulcerative colitis ?No acute issues ?Continue home regimen ? ?Chronic anxiety/depression ?Continue home regimen ? ?Polyneuropathy ?Continue home regimen. ? ? ? ?Code Status: Full code ? ?Family Communication: Updated daughter at bedside. ? ?Disposition Plan:   ?Undetermined.  Per CIR does not meet criteria for inpatient rehab.  PT has recommended home health PT however the patient is worried about going home and having  recurrent falls. ? ?Consultants: ?CIR ?TOC ? ?Procedures: ?2D echo on 06/28/2021. ?Thyroid ultrasound on 06/29/2021. ? ?Antimicrobials: ?None. ? ?DVT  prophylaxis: ?Subcu Lovenox daily. ? ?Status is: Observation. ? ? ? ? ?Objective: ?Vitals:  ? 06/30/21 0747 06/30/21 1111 06/30/21 1112 06/30/21 1529  ?BP: 122/75 99/61 99/61  (!) 106/59  ?Pulse: 94 82 81 87  ?Resp: 17 18 18 16   ?Temp: 97.6 ?F (36.4 ?C) 98.1 ?F (36.7 ?C) 98.1 ?F (36.7 ?C) 97.9 ?F (36.6 ?C)  ?TempSrc:      ?SpO2: 96% 100% 100% 99%  ?Weight:      ?Height:      ? ? ?Intake/Output Summary (Last 24 hours) at 06/30/2021 1643 ?Last data filed at 06/30/2021 1020 ?Gross per 24 hour  ?Intake 480 ml  ?Output 700 ml  ?Net -220 ml  ? ?Filed Weights  ? 06/27/21 1511 06/28/21 0102  ?Weight: 68 kg 69.6 kg  ? ? ?Exam: ? ?General: 76 y.o. year-old female frail-appearing in no acute distress.  She is alert and oriented x3.   ?Cardiovascular: Regular rate and rhythm no rubs or gallops.   ?Respiratory: Clear to auscultation no wheezes or rales.   ?Abdomen: Soft nondistended bowel sounds present.   ?Musculoskeletal: Trace lower extremity edema bilaterally.   ?Skin: No ulcerative lesions noted. ?Psychiatry: Mood is appropriate for condition and setting. ?Neuro: Nonfocal exam. ? ? ?Data Reviewed: ?CBC: ?Recent Labs  ?Lab 06/27/21 ?1524 06/27/21 ?2134 06/28/21 ?9798 06/29/21 ?9211  ?WBC 12.5* 11.1* 8.3 6.4  ?HGB 14.9 12.7 12.4 11.9*  ?HCT 43.7 38.5 37.2 36.2  ?MCV 84.5 85.2 84.4 85.2  ?PLT 513* 404* 407* 403*  ? ?Basic Metabolic Panel: ?Recent Labs  ?Lab 06/27/21 ?1524 06/27/21 ?1718 06/27/21 ?2134 06/28/21 ?9417 06/29/21 ?4081  ?NA 132*  --   --  135 139  ?K 2.9*  --   --  3.3* 3.8  ?CL 98  --   --  101 108  ?CO2 22  --   --  22 24  ?GLUCOSE 105*  --   --  118* 106*  ?BUN 24*  --   --  20 15  ?CREATININE 0.76  --  0.59 0.71 0.52  ?CALCIUM 10.1  --   --  9.9 9.8  ?MG  --  1.7  --  2.6* 1.9  ?PHOS  --   --   --   --  3.6  ? ?GFR: ?Estimated Creatinine Clearance: 58.6 mL/min (by C-G formula based on SCr of 0.52 mg/dL). ?Liver Function Tests: ?Recent Labs  ?Lab 06/28/21 ?0218  ?AST 19  ?ALT 18  ?ALKPHOS 107  ?BILITOT 0.6  ?PROT  6.4*  ?ALBUMIN 3.2*  ? ?No results for input(s): LIPASE, AMYLASE in the last 168 hours. ?No results for input(s): AMMONIA in the last 168 hours. ?Coagulation Profile: ?No results for input(s): INR, PROTIME in the last 168 hours. ?Cardiac Enzymes: ?No results for input(s): CKTOTAL, CKMB, CKMBINDEX, TROPONINI in the last 168 hours. ?BNP (last 3 results) ?No results for input(s): PROBNP in the last 8760 hours. ?HbA1C: ?No results for input(s): HGBA1C in the last 72 hours. ?CBG: ?No results for input(s): GLUCAP in the last 168 hours. ?Lipid Profile: ?No results for input(s): CHOL, HDL, LDLCALC, TRIG, CHOLHDL, LDLDIRECT in the last 72 hours. ?Thyroid Function Tests: ?Recent Labs  ?  06/27/21 ?2134 06/29/21 ?4481  ?TSH <0.010*  --   ?FREET4  --  3.25*  ? ?Anemia Panel: ?Recent Labs  ?  06/29/21 ?0638  ?VITAMINB12 535  ? ?  Urine analysis: ?   ?Component Value Date/Time  ? COLORURINE YELLOW (A) 06/16/2021 0037  ? APPEARANCEUR HAZY (A) 06/16/2021 0037  ? APPEARANCEUR Hazy (A) 12/20/2020 0841  ? LABSPEC 1.018 06/16/2021 0037  ? PHURINE 5.0 06/16/2021 0037  ? GLUCOSEU NEGATIVE 06/16/2021 0037  ? [place] NEGATIVE 06/16/2021 0037  ? [place] NEGATIVE 06/16/2021 0037  ? BILIRUBINUR Negative 12/20/2020 0841  ? KETONESUR 5 (A) 06/16/2021 0037  ? [place] NEGATIVE 06/16/2021 0037  ? NITRITE NEGATIVE 06/16/2021 0037  ? LEUKOCYTESUR TRACE (A) 06/16/2021 0037  ? ?Sepsis Labs: ?@LABRCNTIP (procalcitonin:4,lacticidven:4) ? ?) ?Recent Results (from the past 240 hour(s))  ?Resp Panel by RT-PCR (Flu A&B, Covid) Nasopharyngeal Swab     Status: None  ? Collection Time: 06/27/21  8:47 PM  ? Specimen: Nasopharyngeal Swab; Nasopharyngeal(NP) swabs in vial transport medium  ?Result Value Ref Range Status  ? SARS Coronavirus 2 by RT PCR NEGATIVE NEGATIVE Final  ?  Comment: (NOTE) ?SARS-CoV-2 target nucleic acids are NOT DETECTED. ? ?The SARS-CoV-2 RNA is generally detectable in upper respiratory ?specimens during the acute phase of infection. The  lowest ?concentration of SARS-CoV-2 viral copies this assay can detect is ?138 copies/mL. A negative result does not preclude SARS-Cov-2 ?infection and should not be used as the sole basis for treatment o

## 2021-06-30 NOTE — Progress Notes (Signed)
Physical Therapy Treatment ?Patient Details ?Name: April Larsen ?MRN: [id number] ?DOB: 1946-03-12 ?Today's Date: 06/30/2021 ? ? ?History of Present Illness Patient is a 76 year old female with a PMH (+) for anemia, diverticulitis, ulcerative colitis. Patient reported to [facility] ON 06/27/21 due to a fall. Pt was previously admitted3/19-3/27 following fall at home. ? ?  ?PT Comments  ? ? Pt is making good progress towards goals. Discussed with pt that she doesn't require CIR level of intensity of PT at this time as she is close to baseline level. She still presents with deficits regarding mobility/balance/strength and does report pain in L rib with movement. Pt is at high risk for falls therefore needs close supervision for mobility in home environment. Pt also encouraged to continue to use RW. Agreeable to sit in recliner at end of session to visit with friend. HR well controlled during exertional movement. Will continue to progress as able.   ?Recommendations for follow up therapy are one component of a multi-disciplinary discharge planning process, led by the attending physician.  Recommendations may be updated based on patient status, additional functional criteria and insurance authorization. ? ?Follow Up Recommendations ? Home health PT ?  ?  ?Assistance Recommended at Discharge Frequent or constant Supervision/Assistance  ?Patient can return home with the following A little help with walking and/or transfers;A little help with bathing/dressing/bathroom;Assist for transportation;Help with stairs or ramp for entrance;Assistance with cooking/housework ?  ?Equipment Recommendations ? None recommended by PT  ?  ?Recommendations for Other Services   ? ? ?  ?Precautions / Restrictions Precautions ?Precautions: Fall ?Precaution Comments: recent spine fusion (12/22) ?Restrictions ?Weight Bearing Restrictions: No  ?  ? ?Mobility ? Bed Mobility ?Overal bed mobility: Needs Assistance ?Bed Mobility: Supine to Sit ?  ?  ?Supine to  sit: Min assist ?  ?  ?General bed mobility comments: required assistance via trunk and hand held assist to reach EOB. Cues for scooting out towards EOB. Once seated, able to sit with upright posture ?  ? ?Transfers ?Overall transfer level: Needs assistance ?Equipment used: Rolling walker (2 wheels) ?Transfers: Sit to/from Stand ?Sit to Stand: Supervision ?  ?  ?  ?  ?  ?General transfer comment: safe technique with cues for sequencing including keeping B feet placed under and pushing from seated surface. Once standing, requires cga due to bracing against back of bed and slight unsteadiness ?  ? ?Ambulation/Gait ?Ambulation/Gait assistance: Min guard ?Gait Distance (Feet): 200 Feet ?Assistive device: Rolling walker (2 wheels) ?Gait Pattern/deviations: Decreased step length - right, Decreased step length - left, Decreased stride length, Step-through pattern ?  ?  ?  ?General Gait Details: mild unsteadiness  with ambulation around RN station. Has slight difficulty with obstacle navigation with cues for sequencing. Fatigues with distance. HR monitored throughout with 88bpm pre and 95bpm post ? ? ?Stairs ?  ?  ?  ?  ?  ? ? ?Wheelchair Mobility ?  ? ?Modified Rankin (Stroke Patients Only) ?  ? ? ?  ?Balance Overall balance assessment: Needs assistance ?Sitting-balance support: Feet supported, Bilateral upper extremity supported ?Sitting balance-Leahy Scale: Good ?  ?  ?Standing balance support: During functional activity, Reliant on assistive device for balance, Bilateral upper extremity supported ?Standing balance-Leahy Scale: Fair ?  ?  ?  ?  ?  ?  ?  ?  ?  ?  ?  ?  ?  ? ?  ?Cognition Arousal/Alertness: Awake/alert ?Behavior During Therapy: [facility] for tasks assessed/performed ?Overall Cognitive  Status: Within Functional Limits for tasks assessed ?  ?  ?  ?  ?  ?  ?  ?  ?  ?  ?  ?  ?  ?  ?  ?  ?General Comments: pleasant and agreeable to therapy. ?  ?  ? ?  ?Exercises Other Exercises ?Other Exercises: ambulated to bathroom  with cga. Able to sit/stand from low commode with min assist. Then able to walk to sink to wash hands with cga ? ?  ?General Comments   ?  ?  ? ?Pertinent Vitals/Pain Pain Assessment ?Pain Assessment: Faces ?Faces Pain Scale: Hurts even more ?Pain Location: L rib ?Pain Descriptors / Indicators: Aching, Grimacing ?Pain Intervention(s): Limited activity within patient's tolerance, Repositioned  ? ? ?Home Living   ?  ?  ?  ?  ?  ?  ?  ?  ?  ?   ?  ?Prior Function    ?  ?  ?   ? ?PT Goals (current goals can now be found in the care plan section) Acute Rehab PT Goals ?Patient Stated Goal: to go to [place] [facility] to get stronger ?PT Goal Formulation: With patient ?Time For Goal Achievement: 07/12/21 ?Potential to Achieve Goals: Fair ?Progress towards PT goals: Progressing toward goals ? ?  ?Frequency ? ? ? Min 2X/week ? ? ? ?  ?PT Plan Current plan remains appropriate  ? ? ?Co-evaluation   ?  ?  ?  ?  ? ?  ?AM-PAC PT "6 Clicks" Mobility   ?Outcome Measure ? Help needed turning from your back to your side while in a flat bed without using bedrails?: A Little ?Help needed moving from lying on your back to sitting on the side of a flat bed without using bedrails?: A Little ?Help needed moving to and from a bed to a chair (including a wheelchair)?: A Little ?Help needed standing up from a chair using your arms (e.g., wheelchair or bedside chair)?: A Little ?Help needed to walk in hospital room?: A Little ?Help needed climbing 3-5 steps with a railing? : A Lot ?6 Click Score: 17 ? ?  ?End of Session Equipment Utilized During Treatment: Gait belt ?Activity Tolerance: Patient tolerated treatment well ?Patient left: in chair;with chair alarm set;with family/visitor present ?Nurse Communication: Mobility status ?PT Visit Diagnosis: History of falling (Z91.81);Muscle weakness (generalized) (M62.81);Unsteadiness on feet (R26.81);Repeated falls (R29.6) ?  ? ? ?Time: 3220-2542 ?PT Time Calculation (min) (ACUTE ONLY): 33  min ? ?Charges:  $Gait Training: 8-22 mins ?$Therapeutic Exercise: 8-22 mins          ?          ? ?Greggory Stallion, PT, DPT, GCS ?[phone] ? ? ? ?Kamil Hanigan ?06/30/2021, 4:33 PM ? ?

## 2021-07-01 DIAGNOSIS — E0590 Thyrotoxicosis, unspecified without thyrotoxic crisis or storm: Secondary | ICD-10-CM | POA: Diagnosis not present

## 2021-07-01 DIAGNOSIS — H00019 Hordeolum externum unspecified eye, unspecified eyelid: Secondary | ICD-10-CM

## 2021-07-01 NOTE — Progress Notes (Signed)
?  Progress Note ? ? ?Patient: April Larsen IBB:[id number] DOB: 11-28-1945 DOA: 06/27/2021     0 ?DOS: the patient was seen and examined on 07/01/2021 ?  ?Brief hospital course: ?Mrs. Seedorf is a 76 y.o. F with HTN, depression, ulcerative colitis on [place] and chronic low back pain who presented with falls again. ? ?She was admitted on 3/19-3/27 for falls and discharged to home with home health services.  [place] twice after discharge and so returned to the hospital.  Work-up here revealed hyperthyroidism.  Thyroid ultrasound 4/2 no acute abnormalities.  Started on methimazole 5 mg twice daily and low-dose atenolol 12.5 mg daily. ?  ? ?Assessment and Plan: ?* Hyperthyroidism ?-Continue methimazole ? ?Fall ?- PT eval  ? ?Essential hypertension ?-Continue atenolol  ? ?Vertigo ?-Continue meclizine ? ?Mood disorder ?- Continue Bupropion, cymbalta ? ?Chronic ulcerative colitis, without complications ([place]) ?-Continue mesalamine ?  ?Hordeolum ?- Hot compreses ? ? ? ? ?  ? ?Subjective: The patient has a new stye on her right eye.  She has no fever, confusion, chest pain.  She feels generally weak and tired.  Her vertigo is improved ? ?Physical Exam: ?Vitals:  ? 07/01/21 0005 07/01/21 0424 07/01/21 0746 07/01/21 1118  ?BP: 109/61 (!) 108/58 134/63 108/68  ?Pulse: 93 88 93 88  ?Resp: 14 13 14 16   ?Temp: 98.7 ?F (37.1 ?C) 98.5 ?F (36.9 ?C) 97.7 ?F (36.5 ?C) 97.8 ?F (36.6 ?C)  ?TempSrc:   Oral Oral  ?SpO2: 93% 96% 100% 100%  ?Weight:      ?Height:      ? ?Elderly adult female, sitting up in recliner, interactive, appears sleepy and tired.  No ataxic movements, moves extremities with symmetric strength, generalized weakness.  RRR, no murmurs, no lower extremity edema, normal respiratory rate and rhythm, lungs clear without rales or wheezes. ? ? ? ? ? ?Data Reviewed: ?Physical therapy notes reviewed, nursing notes reviewed, vital signs reviewed. ? ? ?Family Communication: Daughter at the bedside ? ?Disposition: ?Status is:  Observation ? ? Planned Discharge Destination: To be determined ? ? ? ? ?Author: ?Edwin Dada, MD ?07/01/2021 5:30 PM ? ?For on call review [email or web address].  ?

## 2021-07-01 NOTE — TOC Progression Note (Signed)
Transition of Care (TOC) - Progression Note  ? ? ?Patient Details  ?Name: April Larsen ?MRN: [id number] ?Date of Birth: Aug 11, 1945 ? ?Transition of Care (TOC) CM/SW Contact  ?Alberteen Sam, LCSW ?Phone Number: ?07/01/2021, 1:30 PM ? ?Clinical Narrative:    ? ? ?CSW lvm with patient's daughter to inform that [facility] made a bed offer and this CSW has reached out to [employer] to initiate insurance auth. Pending verdict on insurance auth at this time.  ? ?  ?  ? ?Expected Discharge Plan and Services ?  ?  ?  ?  ?  ?                ?  ?  ?  ?  ?  ?  ?  ?  ?  ?  ? ? ?Social Determinants of Health (SDOH) Interventions ?  ? ?Readmission Risk Interventions ?   ? View : No data to display.  ?  ?  ?  ? ? ?

## 2021-07-01 NOTE — Progress Notes (Signed)
Occupational Therapy Treatment ?Patient Details ?Name: April Larsen ?MRN: [id number] ?DOB: [birth year] ?Today's Date: 07/01/2021 ? ? ?History of present illness Patient is a 76 year old female with a PMH (+) for anemia, diverticulitis, ulcerative colitis. Patient reported to [facility] ON 06/27/21 due to a fall. Pt was previously admitted3/19-3/27 following fall at home. ?  ?OT comments ? April Larsen was seen for OT treatment on this date. Upon arrival to room pt awake/alert, seated in room recliner with multiple family members present at bedside. Pt agreeable to OT tx session despite 5/10 L rib pain with mobility. OT facilitated functional tasks as described below. Pt demonstrates improved balance, standing tolerance, and functional independence from past OT sessions. She does not require physical assist for mobility t/o session and is able to perform functional transfers in her room using a RW with supervision for safety. Pt is MOD I for standing grooming tasks and MOD I for bed mobility at end of session.  Pt/caregiver at bedside provided with extensive education on role of OT in acute care setting vs. Home health, falls prevention strategies, and DC recs this date. Pt with limited acceptance of education provided. She continues to demonstrate decreased safety awareness and benefits from ongoing OT services while acutely hospitalized to maximize return to PLOF and minimize risk of future falls, injury, caregiver burden, and readmission. Will continue to follow POC as written Discharge recommendation updated to [facility] given pt progress toward goals and current functional status. Care team updated.   ?  ? ?Recommendations for follow up therapy are one component of a multi-disciplinary discharge planning process, led by the attending physician.  Recommendations may be updated based on patient status, additional functional criteria and insurance authorization. ?   ?Follow Up Recommendations ? Home health OT  ?  ?Assistance  Recommended at Discharge Intermittent Supervision/Assistance  ?Patient can return home with the following ? Assistance with cooking/housework;Help with stairs or ramp for entrance;Assist for transportation;A little help with walking and/or transfers;A little help with bathing/dressing/bathroom ?  ?Equipment Recommendations ? Tub/shower seat  ?  ?Recommendations for Other Services   ? ?  ?Precautions / Restrictions Precautions ?Precautions: Fall ?Precaution Comments: recent spine fusion (12/22) ?Restrictions ?Weight Bearing Restrictions: No  ? ? ?  ? ?Mobility Bed Mobility ?Overal bed mobility: Modified Independent ?Bed Mobility: Sit to Supine ?  ?  ?  ?Sit to supine: Modified independent (Device/Increase time) ?  ?General bed mobility comments: Increased time/effort to perform it>sup t/f. noted. No physical assistance needed. Pt with good eccentric control and safety awareness. ?  ? ?Transfers ?Overall transfer level: Needs assistance ?Equipment used: Rolling walker (2 wheels) ?Transfers: Sit to/from Stand ?Sit to Stand: Supervision ?  ?  ?  ?  ?  ?General transfer comment: able to push off seated surface for successful stand. RW used ?  ?  ?Balance Overall balance assessment: Needs assistance ?Sitting-balance support: Feet supported, No upper extremity supported ?Sitting balance-Leahy Scale: Good ?Sitting balance - Comments: steady static sitting, reaching within BOS at EOB. ?  ?Standing balance support: During functional activity, Single extremity supported, No upper extremity supported ?Standing balance-Leahy Scale: Good ?Standing balance comment: steady standing at sink during functional activities. Able to go brief periods with no UE support without LOB. ?  ?  ?  ?  ?  ?  ?  ?  ?  ?  ?  ?   ? ?ADL either performed or assessed with clinical judgement  ? ?ADL Overall ADL's : Needs  assistance/impaired ?  ?  ?Grooming: Standing;Wash/dry face;Oral care;Brushing hair;Modified independent ?Grooming Details (indicate  cue type and reason): Standing at sink with RW. Pt stood approx ~8 min while performing grooming tasks without physical assist. ?  ?  ?  ?  ?  ?  ?  ?  ?  ?  ?  ?  ?  ?  ?Functional mobility during ADLs: Supervision/safety;Rolling walker (2 wheels) ?General ADL Comments: Supervision with RW approx 15 feet around room ?  ? ?Extremity/Trunk Assessment Upper Extremity Assessment ?Upper Extremity Assessment: Generalized weakness ?  ?Lower Extremity Assessment ?Lower Extremity Assessment: Generalized weakness ?  ?  ?  ? ?Vision Patient Visual Report: No change from baseline ?  ?  ?Perception   ?  ?Praxis   ?  ? ?Cognition Arousal/Alertness: Awake/alert ?Behavior During Therapy: [facility] for tasks assessed/performed ?Overall Cognitive Status: Within Functional Limits for tasks assessed ?  ?  ?  ?  ?  ?  ?  ?  ?  ?  ?  ?  ?  ?  ?  ?  ?General Comments: pleasant and agreeable to therapy ?  ?  ?   ?Exercises Other Exercises ?Other Exercises: Pt/caregiver educated for extended time on role of OT in acute setting, falls prevention strategies, DC recs, and routines modifications to support safety and functional independence upon DC home. OT also facilitated standing grooming tasks and functional mobility as described above. See ADL section for additional deatil. ? ?  ?Shoulder Instructions   ? ? ?  ?General Comments    ? ? ?Pertinent Vitals/ Pain       Pain Assessment ?Pain Assessment: 0-10 ?Pain Score: 5  ?Pain Location: L rib ?Pain Descriptors / Indicators: Aching, Grimacing ?Pain Intervention(s): Limited activity within patient's tolerance, Monitored during session, Repositioned ? ?Home Living   ?  ?  ?  ?  ?  ?  ?  ?  ?  ?  ?  ?  ?  ?  ?  ?  ?  ?  ? ?  ?Prior Functioning/Environment    ?  ?  ?  ?   ? ?Frequency ? Min 3X/week  ? ? ? ? ?  ?Progress Toward Goals ? ?OT Goals(current goals can now be found in the care plan section) ? Progress towards OT goals: Progressing toward goals ? ?Acute Rehab OT Goals ?Patient Stated Goal: to  have fewer falls. ?OT Goal Formulation: With patient/family ?Time For Goal Achievement: 07/13/21 ?Potential to Achieve Goals: Good  ?Plan Discharge plan needs to be updated;Frequency remains appropriate   ? ?Co-evaluation ? ? ?   ?  ?  ?  ?  ? ?  ?AM-PAC OT "6 Clicks" Daily Activity     ?Outcome Measure ? ? Help from another person eating meals?: None ?Help from another person taking care of personal grooming?: None ?Help from another person toileting, which includes using toliet, bedpan, or urinal?: A Little ?Help from another person bathing (including washing, rinsing, drying)?: A Little ?Help from another person to put on and taking off regular upper body clothing?: None ?Help from another person to put on and taking off regular lower body clothing?: A Little ?6 Click Score: 21 ? ?  ?End of Session Equipment Utilized During Treatment: Rolling walker (2 wheels) ? ?OT Visit Diagnosis: Unsteadiness on feet (R26.81);Repeated falls (R29.6);Muscle weakness (generalized) (M62.81) ?  ?Activity Tolerance Patient tolerated treatment well ?  ?Patient Left in bed;with bed alarm set;with family/visitor present ?  ?  Nurse Communication Mobility status ?  ? ?   ? ?Time: 5009-3818 ?OT Time Calculation (min): 45 min ? ?Charges: OT General Charges ?$OT Visit: 1 Visit ?OT Treatments ?$Self Care/Home Management : 23-37 mins ?$Therapeutic Activity: 8-22 mins ? ?Shara Blazing, M.S., OTR/L ?Feeding Team - [facility] ?Ascom: [phone] ?07/01/21, 3:43 PM ? ?

## 2021-07-01 NOTE — Progress Notes (Signed)
Patient's right eye is swollen, pink and painful. Notified MD. ?

## 2021-07-01 NOTE — NC FL2 (Signed)
?[place] MEDICAID FL2 LEVEL OF CARE SCREENING TOOL  ?  ? ?IDENTIFICATION  ?Patient Name: ?April Larsen Birthdate: 1945/07/12 Sex: female Admission Date (Current Location): ?06/27/2021  ?[place] and [place] Number: ? [place] ?  Facility and Address:  ?[facility], [address] [place], [place] [address] ?     Provider Number: ?[id number]  ?Attending Physician Name and Address:  ?Edwin Dada, * ? Relative Name and Phone Number:  ?Josephine Igo [phone] ?   ?Current Level of Care: ?Hospital Recommended Level of Care: ?[facility] Prior Approval Number: ?  ? ?Date Approved/Denied: ?  PASRR Number: ?[id number] A ? ?Discharge Plan: ?SNF ?  ? ?Current Diagnoses: ?Patient Active Problem List  ? Diagnosis Date Noted  ? Chest pain 06/28/2021  ? Fall 06/27/2021  ? Vertigo 06/16/2021  ? Spinal surgery in prior 3 months for correction of scoliosis 06/16/2021  ? Fall (on) (from) other stairs and steps 06/14/21, subsequent encounter 06/16/2021  ? Nausea and vomiting   ? Post concussion syndrome   ? Hypokalemia   ? Essential hypertension 10/08/2020  ? Mild dementia ([place]) 06/12/2020  ? History of repair of paraesophageal hernia 02/27/2020  ? Hiatal hernia   ? Stomach irritation   ? Primary osteoarthritis of right knee 01/05/2020  ? Generalized osteoarthritis 04/13/2019  ? Osteopenia of multiple sites 04/13/2019  ? History of 2019 novel coronavirus disease (COVID-19) 01/19/2019  ? Pneumonia due to COVID-19 virus 01/09/2019  ? Acute respiratory failure with hypoxia ([place]) 01/09/2019  ? Major depressive disorder, recurrent, mild ([place]) 12/08/2018  ? Hyperlipidemia, mixed 11/19/2017  ? Diverticulitis of large intestine without perforation or abscess without bleeding 05/18/2017  ? Medicare annual wellness visit, initial 11/11/2016  ? Postmenopausal 11/09/2016  ? Diverticulitis 08/06/2016  ? Gastroesophageal reflux disease without esophagitis 08/06/2016  ? Chronic ulcerative colitis,  without complications ([place]) 93/23/5573  ? Scoliosis of lumbosacral spine 01/18/2015  ? Trochanteric bursitis 11/29/2014  ? Esophagitis, reflux 10/04/2014  ? Incomplete emptying of bladder 10/29/2013  ? Female stress incontinence 08/09/2013  ? Urethral prolapse 08/09/2013  ? Urge incontinence 08/09/2013  ? Migraine 07/26/2013  ? Scoliosis (and kyphoscoliosis), idiopathic 07/26/2013  ? Ulcerative colitis ([place]) 07/26/2013  ? ? ?Orientation RESPIRATION BLADDER Height & Weight   ?  ?Self, Time, Situation, Place ? Normal Continent Weight: 153 lb 8 oz (69.6 kg) ?Height:  5' 6"  (167.6 cm)  ?BEHAVIORAL SYMPTOMS/MOOD NEUROLOGICAL BOWEL NUTRITION STATUS  ?    Continent Diet (see discharge summary)  ?AMBULATORY STATUS COMMUNICATION OF NEEDS Skin   ?Limited Assist Verbally Normal ?  ?  ?  ?    ?     ?     ? ? ?Personal Care Assistance Level of Assistance  ?Bathing, Feeding, Dressing, Total care Bathing Assistance: Limited assistance ?Feeding assistance: Independent ?Dressing Assistance: Limited assistance ?Total Care Assistance: Limited assistance  ? ?Functional Limitations Info  ?Sight, Hearing, Speech Sight Info: Adequate ?Hearing Info: Adequate ?Speech Info: Adequate  ? ? ?SPECIAL CARE FACTORS FREQUENCY  ?PT (By licensed PT), OT (By licensed OT)   ?  ?PT Frequency: min 4x weekly ?OT Frequency: min 4x weekly ?  ?  ?  ?   ? ? ?Contractures Contractures Info: Not present  ? ? ?Additional Factors Info  ?Code Status, Allergies Code Status Info: full ?Allergies Info: nsaids, sulfa antibiotics ?  ?  ?  ?   ? ?Current Medications (07/01/2021):  This is the current hospital active medication list ?Current Facility-Administered Medications  ?  Medication Dose Route Frequency Provider Last Rate Last Admin  ? acetaminophen (TYLENOL) tablet 650 mg  650 mg Oral Q6H PRN Para Skeans, MD   650 mg at 06/30/21 2042  ? Or  ? acetaminophen (TYLENOL) suppository 650 mg  650 mg Rectal Q6H PRN Para Skeans, MD      ? atenolol (TENORMIN) tablet 12.5  mg  12.5 mg Oral Daily Irene Pap N, DO   12.5 mg at 07/01/21 0805  ? buPROPion (WELLBUTRIN XL) 24 hr tablet 150 mg  150 mg Oral Daily Irene Pap N, DO   150 mg at 07/01/21 6767  ? calcium-vitamin D (OSCAL WITH D) 500-5 MG-MCG per tablet 1 tablet  1 tablet Oral BID WC Irene Pap N, DO   1 tablet at 07/01/21 0804  ? cholecalciferol (VITAMIN D3) tablet 2,000 Units  2,000 Units Oral Daily Kayleen Memos, DO   2,000 Units at 07/01/21 2094  ? diazepam (VALIUM) tablet 2 mg  2 mg Oral Q8H PRN Irene Pap N, DO   2 mg at 06/30/21 2156  ? DULoxetine (CYMBALTA) DR capsule 60 mg  60 mg Oral QHS Irene Pap N, DO   60 mg at 06/30/21 2157  ? enoxaparin (LOVENOX) injection 40 mg  40 mg Subcutaneous Q24H Irene Pap N, DO   40 mg at 06/30/21 2157  ? feeding supplement (ENSURE ENLIVE / ENSURE PLUS) liquid 237 mL  237 mL Oral BID BM Irene Pap N, DO   237 mL at 07/01/21 0805  ? gabapentin (NEURONTIN) capsule 300 mg  300 mg Oral TID PRN Irene Pap N, DO   300 mg at 06/29/21 1718  ? meclizine (ANTIVERT) tablet 25 mg  25 mg Oral TID PRN Kayleen Memos, DO      ? mesalamine (PENTASA) CR capsule 1,500 mg  1,500 mg Oral Daily Irene Pap N, DO   1,500 mg at 07/01/21 0805  ? methimazole (TAPAZOLE) tablet 5 mg  5 mg Oral BID Irene Pap N, DO   5 mg at 07/01/21 7096  ? metoCLOPramide (REGLAN) tablet 10 mg  10 mg Oral TID AC Hall, Carole N, DO   10 mg at 07/01/21 2836  ? morphine (PF) 2 MG/ML injection 1 mg  1 mg Intravenous Q2H PRN Para Skeans, MD   1 mg at 06/30/21 2156  ? nitroGLYCERIN (NITROSTAT) SL tablet 0.4 mg  0.4 mg Sublingual Q5 min PRN Para Skeans, MD      ? pantoprazole (PROTONIX) EC tablet 40 mg  40 mg Oral Daily Irene Pap N, DO   40 mg at 07/01/21 6294  ? ? ? ?Discharge Medications: ?Please see discharge summary for a list of discharge medications. ? ?Relevant Imaging Results: ? ?Relevant Lab Results: ? ? ?Additional Information ?[id number] ? ?Alberteen Sam, LCSW ? ? ? ? ?

## 2021-07-01 NOTE — Assessment & Plan Note (Signed)
-  Continue mesalamine ?

## 2021-07-01 NOTE — Progress Notes (Signed)
Physical Therapy Treatment ?Patient Details ?Name: April Larsen ?MRN: [id number] ?DOB: [birth year] ?Today's Date: 07/01/2021 ? ? ?History of Present Illness Patient is a 76 year old female with a PMH (+) for anemia, diverticulitis, ulcerative colitis. Patient reported to [facility] ON 06/27/21 due to a fall. Pt was previously admitted3/19-3/27 following fall at home. ? ?  ?PT Comments  ? ? Pt is making good progress towards goals. Pt still with fatigue and pain from L rib fx. Able to ambulate around entire RN station with cga while using RW. Good endurance with there-ex with minimal discomfort. Daughter present in room at end of session. After session ended, received message to discuss recs with family. Went by room, however daughter not present. Called daughter twice, no answer. Discussed with care team via secure chat. Pt would benefit from supervision with all OOB tasks in home environment due to risk of falls. Assistance required is custodial care in nature. Continue to recommend HHPT to work on safety and balance to reduce falls risk. Will continue to progress throughout hospital stay.   ?Recommendations for follow up therapy are one component of a multi-disciplinary discharge planning process, led by the attending physician.  Recommendations may be updated based on patient status, additional functional criteria and insurance authorization. ? ?Follow Up Recommendations ? Home health PT ?  ?  ?Assistance Recommended at Discharge Frequent or constant Supervision/Assistance  ?Patient can return home with the following A little help with walking and/or transfers;A little help with bathing/dressing/bathroom;A lot of help with bathing/dressing/bathroom;Help with stairs or ramp for entrance ?  ?Equipment Recommendations ? None recommended by PT  ?  ?Recommendations for Other Services   ? ? ?  ?Precautions / Restrictions Precautions ?Precautions: Fall ?Precaution Comments: recent spine fusion (12/22) ?Restrictions ?Weight  Bearing Restrictions: No  ?  ? ?Mobility ? Bed Mobility ?Overal bed mobility: Needs Assistance ?Bed Mobility: Supine to Sit ?  ?  ?Supine to sit: Min guard ?  ?  ?General bed mobility comments: safe technique with improved independence this session. Hand held assist given due to rib pain. Once seated, upright posture noted ?  ? ?Transfers ?Overall transfer level: Needs assistance ?Equipment used: Rolling walker (2 wheels) ?Transfers: Sit to/from Stand ?Sit to Stand: Supervision ?  ?  ?  ?  ?  ?General transfer comment: able to push off seated surface for successful stand. RW used ?  ? ?Ambulation/Gait ?Ambulation/Gait assistance: Min guard ?Gait Distance (Feet): 200 Feet ?Assistive device: Rolling walker (2 wheels) ?Gait Pattern/deviations: Step-through pattern ?  ?  ?  ?General Gait Details: ambulated with slightly improved gait speed this date, able to talk while walking without losing balance. Does take 1 standing rest break due to fatigue and required 1 cue for obstacle avoidance. Cues for keeping close to RW during turns ? ? ?Stairs ?  ?  ?  ?  ?  ? ? ?Wheelchair Mobility ?  ? ?Modified Rankin (Stroke Patients Only) ?  ? ? ?  ?Balance Overall balance assessment: Needs assistance ?Sitting-balance support: Feet supported, Bilateral upper extremity supported ?Sitting balance-Leahy Scale: Good ?  ?  ?Standing balance support: During functional activity, Reliant on assistive device for balance, Bilateral upper extremity supported ?Standing balance-Leahy Scale: Fair ?  ?  ?  ?  ?  ?  ?  ?  ?  ?  ?  ?  ?  ? ?  ?Cognition Arousal/Alertness: Awake/alert ?Behavior During Therapy: [facility] for tasks assessed/performed ?Overall Cognitive Status: Within Functional Limits  for tasks assessed ?  ?  ?  ?  ?  ?  ?  ?  ?  ?  ?  ?  ?  ?  ?  ?  ?General Comments: pleasant and agreeable to therapy ?  ?  ? ?  ?Exercises Other Exercises ?Other Exercises: Performed 5 reps of STS with cues for safety, standing ther-ex: marching, hip ext,  heel raises & sitting ther-ex: B LAQ with feet together. 10 reps performed with safe technique and supervision ?Other Exercises: able to don socks and brief with supervision this date ? ?  ?General Comments   ?  ?  ? ?Pertinent Vitals/Pain Pain Assessment ?Pain Assessment: Faces ?Faces Pain Scale: Hurts little more ?Pain Location: L rib ?Pain Descriptors / Indicators: Aching, Grimacing ?Pain Intervention(s): Limited activity within patient's tolerance, Repositioned  ? ? ?Home Living   ?  ?  ?  ?  ?  ?  ?  ?  ?  ?   ?  ?Prior Function    ?  ?  ?   ? ?PT Goals (current goals can now be found in the care plan section) Acute Rehab PT Goals ?Patient Stated Goal: to get stronger ?PT Goal Formulation: With patient ?Time For Goal Achievement: 07/12/21 ?Potential to Achieve Goals: Fair ?Progress towards PT goals: Progressing toward goals ? ?  ?Frequency ? ? ? Min 2X/week ? ? ? ?  ?PT Plan Current plan remains appropriate  ? ? ?Co-evaluation   ?  ?  ?  ?  ? ?  ?AM-PAC PT "6 Clicks" Mobility   ?Outcome Measure ? Help needed turning from your back to your side while in a flat bed without using bedrails?: None ?Help needed moving from lying on your back to sitting on the side of a flat bed without using bedrails?: A Little ?Help needed moving to and from a bed to a chair (including a wheelchair)?: A Little ?Help needed standing up from a chair using your arms (e.g., wheelchair or bedside chair)?: A Little ?Help needed to walk in hospital room?: A Little ?Help needed climbing 3-5 steps with a railing? : A Lot ?6 Click Score: 18 ? ?  ?End of Session Equipment Utilized During Treatment: Gait belt ?Activity Tolerance: Patient tolerated treatment well ?Patient left: in chair;with family/visitor present;with chair alarm set (daughter in room) ?Nurse Communication: Mobility status ?PT Visit Diagnosis: History of falling (Z91.81);Muscle weakness (generalized) (M62.81);Unsteadiness on feet (R26.81);Repeated falls (R29.6) ?  ? ? ?Time:  1011-1040 ?PT Time Calculation (min) (ACUTE ONLY): 29 min ? ?Charges:  $Gait Training: 8-22 mins ?$Therapeutic Exercise: 8-22 mins          ?          ? ?Greggory Stallion, PT, DPT, GCS ?[phone] ? ? ? ?Roan Miklos ?07/01/2021, 1:50 PM ? ?

## 2021-07-01 NOTE — Assessment & Plan Note (Signed)
-  Continue methimazole ?

## 2021-07-01 NOTE — TOC Progression Note (Signed)
Transition of Care (TOC) - Progression Note  ? ? ?Patient Details  ?Name: April Larsen ?MRN: [id number] ?Date of Birth: 02-07-1946 ? ?Transition of Care (TOC) CM/SW Contact  ?Alberteen Sam, LCSW ?Phone Number: ?07/01/2021, 11:01 AM ? ?Clinical Narrative:    ? ?Both [facility] in [place] have denied to accept patient due to her not meeting medical necessity.  ? ?Per PT notes yesterday 4/3 patient walked 200 feet with rolling walker and recommended home health.  ? ?CSW talked to patient's daughter this morning to review above information. Yesterday 4/3 daughter was not interested in SNF however reports after CIR and [facility] rehab denials, she is interested in pursuing SNF placement for patient now.  ? ?Patient's daughter reports concerns with patient going home and would like to try to get patient into rehab. She is interested in [facility] sending out SNF referrals for bed offers, then once she chooses SNF facility will start auth.  ? ?Daughter made aware that unlikely rehab will be approved due to patient's home health rec, reports she would like CSW to try for insurance approval for snf and they will appeal if denied.  ? ?Reports if denied, they will also look into private pay for SNF.  ? ?CSW has sent out referrals for bed offers, pending bed offers at this time.  ? ? ?  ?  ? ?Expected Discharge Plan and Services ?  ?  ?  ?  ?  ?                ?  ?  ?  ?  ?  ?  ?  ?  ?  ?  ? ? ?Social Determinants of Health (SDOH) Interventions ?  ? ?Readmission Risk Interventions ?   ? View : No data to display.  ?  ?  ?  ? ? ?

## 2021-07-02 DIAGNOSIS — D539 Nutritional anemia, unspecified: Secondary | ICD-10-CM | POA: Insufficient documentation

## 2021-07-02 DIAGNOSIS — K5732 Diverticulitis of large intestine without perforation or abscess without bleeding: Secondary | ICD-10-CM | POA: Diagnosis not present

## 2021-07-02 DIAGNOSIS — K219 Gastro-esophageal reflux disease without esophagitis: Secondary | ICD-10-CM | POA: Diagnosis not present

## 2021-07-02 DIAGNOSIS — E559 Vitamin D deficiency, unspecified: Secondary | ICD-10-CM | POA: Insufficient documentation

## 2021-07-02 DIAGNOSIS — H00011 Hordeolum externum right upper eyelid: Secondary | ICD-10-CM | POA: Diagnosis not present

## 2021-07-02 DIAGNOSIS — A881 Epidemic vertigo: Secondary | ICD-10-CM | POA: Diagnosis not present

## 2021-07-02 DIAGNOSIS — E0590 Thyrotoxicosis, unspecified without thyrotoxic crisis or storm: Secondary | ICD-10-CM | POA: Insufficient documentation

## 2021-07-02 DIAGNOSIS — M8589 Other specified disorders of bone density and structure, multiple sites: Secondary | ICD-10-CM | POA: Insufficient documentation

## 2021-07-02 DIAGNOSIS — R42 Dizziness and giddiness: Secondary | ICD-10-CM | POA: Diagnosis not present

## 2021-07-02 DIAGNOSIS — I10 Essential (primary) hypertension: Secondary | ICD-10-CM | POA: Diagnosis not present

## 2021-07-02 DIAGNOSIS — S2232XA Fracture of one rib, left side, initial encounter for closed fracture: Secondary | ICD-10-CM | POA: Diagnosis not present

## 2021-07-02 DIAGNOSIS — M5459 Other low back pain: Secondary | ICD-10-CM | POA: Diagnosis not present

## 2021-07-02 DIAGNOSIS — E0580 Other thyrotoxicosis without thyrotoxic crisis or storm: Secondary | ICD-10-CM | POA: Diagnosis not present

## 2021-07-02 DIAGNOSIS — M899 Disorder of bone, unspecified: Secondary | ICD-10-CM | POA: Insufficient documentation

## 2021-07-02 DIAGNOSIS — F32A Depression, unspecified: Secondary | ICD-10-CM | POA: Diagnosis not present

## 2021-07-02 DIAGNOSIS — S2232XD Fracture of one rib, left side, subsequent encounter for fracture with routine healing: Secondary | ICD-10-CM | POA: Diagnosis not present

## 2021-07-02 DIAGNOSIS — R296 Repeated falls: Secondary | ICD-10-CM | POA: Diagnosis not present

## 2021-07-02 DIAGNOSIS — R079 Chest pain, unspecified: Secondary | ICD-10-CM | POA: Diagnosis not present

## 2021-07-02 DIAGNOSIS — E876 Hypokalemia: Secondary | ICD-10-CM | POA: Diagnosis not present

## 2021-07-02 DIAGNOSIS — M6281 Muscle weakness (generalized): Secondary | ICD-10-CM | POA: Diagnosis not present

## 2021-07-02 DIAGNOSIS — H00019 Hordeolum externum unspecified eye, unspecified eyelid: Secondary | ICD-10-CM | POA: Diagnosis not present

## 2021-07-02 DIAGNOSIS — W19XXXD Unspecified fall, subsequent encounter: Secondary | ICD-10-CM | POA: Diagnosis not present

## 2021-07-02 DIAGNOSIS — K5190 Ulcerative colitis, unspecified, without complications: Secondary | ICD-10-CM | POA: Diagnosis not present

## 2021-07-02 DIAGNOSIS — M4126 Other idiopathic scoliosis, lumbar region: Secondary | ICD-10-CM | POA: Diagnosis not present

## 2021-07-02 MED ORDER — ATENOLOL 25 MG PO TABS: 12.5000 mg | ORAL_TABLET | Freq: Every day | ORAL | Status: DC

## 2021-07-02 MED ORDER — METHIMAZOLE 5 MG PO TABS
5.0000 mg | ORAL_TABLET | Freq: Two times a day (BID) | ORAL | Status: DC
Start: 2021-07-02 — End: 2023-05-03

## 2021-07-02 MED ORDER — ENSURE ENLIVE PO LIQD: 237.0000 mL | Freq: Two times a day (BID) | ORAL | 12 refills | Status: DC

## 2021-07-02 NOTE — Progress Notes (Signed)
Pt going to [facility] in [place]. And report called and documents given to pt daughter who will be transporting her.  ?

## 2021-07-02 NOTE — TOC Progression Note (Addendum)
Transition of Care (TOC) - Progression Note  ? ? ?Patient Details  ?Name: April Larsen ?MRN: [id number] ?Date of Birth: 1945/08/03 ? ?Transition of Care (TOC) CM/SW Contact  ?Alberteen Sam, LCSW ?Phone Number: ?07/02/2021, 9:05 AM ? ?Clinical Narrative:    ? ?Update: following peer to peer, insurance has approved.  ? ? ? ? ? CSW received call from [facility] that patient was denied insurance authorization for snf due to balance impairment after surgery will not improve with snf at short term rehab, and patient is essentially back to baseline.  ? ?[facility] offered peer to peer with Dr. Amalia Hailey at [facility], information provided to Dr. Loleta Books to reach out.  ? ?CSW has lvm with patient's daughter [name] with above updates.  ? ?  ? ?Expected Discharge Plan and Services ?  ?  ?  ?  ?  ?                ?  ?  ?  ?  ?  ?  ?  ?  ?  ?  ? ? ?Social Determinants of Health (SDOH) Interventions ?  ? ?Readmission Risk Interventions ?   ? View : No data to display.  ?  ?  ?  ? ? ?

## 2021-07-02 NOTE — Discharge Summary (Signed)
?Physician Discharge Summary ?  ?Patient: April Larsen MRN: [id number] DOB: [birth year]  ?Admit date:     06/27/2021  ?Discharge date: 07/02/21  ?Discharge Physician: Edwin Dada  ? ?PCP: Rusty Aus, MD  ? ?Recommendations at discharge:  ?Follow up with PCP 1 week after discharge from SNF ?Follow up with Endocrinology as soon as able for new hyperthyroidism ? ? ? ? ? ? ?Discharge Diagnoses: ?Principal Problem: ?  Hyperthyroidism ?Active Problems: ?  Chronic ulcerative colitis, without complications ([place]) ?  Vertigo ?  Essential hypertension ?  Hypokalemia ?  Fall ?  Hordeolum ?  ? ? ? ? ? ?Hospital Course: ?Mrs. Friend is a 76 y.o. F with HTN, depression, ulcerative colitis on Lialda and chronic low back pain due to scoliosis s/p extensive extensive spinal fusion last winter who presented with falls again. ? ?She was admitted on 3/19-3/27 for falls and discharged to home with home health services.  [place] twice after discharge and so returned to the hospital.   ? ?Here, she was found to have hyperthyroidism.   ? ? ? ? ?* Hyperthyroidism ?Admitted with generalized weakness, asthenia, tremor and tachycardia with exertion.  Low TSH from last Sep was followed up and found to be undetectable.  FT4 measured and >3x ULN. ? ?Thyroid ultrasound was done and showed no abnormalities, and so was presumed Graves and started on atenolol and methimazole. ? ?Recommend close Endocrinology follow up. ? ? ? ? ? ?Fall ?Due to hyperthyroidism in setting of recent back surgery. ? ?  ?Hordeolum ?Developed sty overnight prior to discharge.  No evidence of preorbital cellulitis.  Recommend hot compresses and follow up if no improvement in 1-2 weeks. ? ?Vertigo ?Recommend avoiding benzodiazepines, gabapentin, as these are associated with falls in the elderly. ? ? ? ? ? ? ? ?  ? ?Pain control - [employer] Controlled Substance Reporting System database was reviewed.  ? ? ? ? ? ? ?  ? ?DISCHARGE MEDICATION: ?Allergies as of  07/02/2021   ? ?   Reactions  ? Nsaids Other (See Comments)  ? Patient has had part of her colon removed   ? Sulfa Antibiotics Rash  ? ?  ? ?  ?Medication List  ?  ? ?STOP taking these medications   ? ?B-complex with vitamin C tablet ?  ?diazepam 2 MG tablet ?Commonly known as: VALIUM ?  ?furosemide 20 MG tablet ?Commonly known as: LASIX ?  ?gabapentin 300 MG capsule ?Commonly known as: NEURONTIN ?  ? ?  ? ?TAKE these medications   ? ?acetaminophen 500 MG tablet ?Commonly known as: TYLENOL ?Take 1,000 mg by mouth every 6 (six) hours as needed for moderate pain or headache. ?  ?atenolol 25 MG tablet ?Commonly known as: TENORMIN ?Take 0.5 tablets (12.5 mg total) by mouth daily. ?Start taking on: July 03, 2021 ?  ?buPROPion 150 MG 24 hr tablet ?Commonly known as: WELLBUTRIN XL ?Take 150 mg by mouth daily. ?  ?CALCIUM 600+D PO ?Take 1 tablet by mouth 2 (two) times daily. ?  ?DULoxetine 60 MG capsule ?Commonly known as: CYMBALTA ?Take 60 mg by mouth at bedtime. ?  ?feeding supplement Liqd ?Take 237 mLs by mouth 2 (two) times daily between meals. ?  ?meclizine 25 MG tablet ?Commonly known as: ANTIVERT ?Take 1 tablet (25 mg total) by mouth 3 (three) times daily as needed for dizziness or nausea. ?  ?mesalamine 0.375 g 24 hr capsule ?Commonly known as: APRISO ?Take 4 capsules (  1.5 g total) by mouth daily. ?  ?methimazole 5 MG tablet ?Commonly known as: TAPAZOLE ?Take 1 tablet (5 mg total) by mouth 2 (two) times daily. ?  ?metoCLOPramide 10 MG tablet ?Commonly known as: REGLAN ?Take 1 tablet (10 mg total) by mouth 3 (three) times daily before meals for 14 days. ?  ?pantoprazole 40 MG tablet ?Commonly known as: PROTONIX ?Take 40 mg by mouth daily. ?  ?Vitamin D3 50 MCG (2000 UT) Tabs ?Take 2,000 Units by mouth daily. ?  ? ?  ? ? Follow-up Information   ? ? Judi Cong, MD. Schedule an appointment as soon as possible for a visit in 2 week(s).   ?Specialty: Endocrinology ?Contact information: ?[address] ?[place] [address] ?[phone] ? ? ?  ?  ? ? Rusty Aus, MD. Schedule an appointment as soon as possible for a visit in 1 week(s).   ?Specialty: Internal Medicine ?Contact information: ?[address] ?[place] [address] ?[phone] ? ? ?  ?  ? ?  ?  ? ?  ? ?Discharge Instructions   ? ? Discharge instructions   Complete by: As directed ?  ? From Dr. Loleta Books: ?You were admitted for a fall.   ?Here, we found that you were having vertigo and also (more importantly) your thyroid levels were very abnormal. ? ?You were started on two medicines to correct this (atenolol to reduce the symptoms of high thyroid levels and methimazole to suppress the thyroid) ? ?You must go see an endocrinologist as soon as possible ?Call Dr. Joycie Peek office at the number listed below for an appointment ?If you can't get an appointment with them, discuss with Dr. Sabra Heck ? ? ?Do NOT take gabapentin or diazepam, as these are associated with dizziness and falls (they are to medicines that had been prescribed for your for the vertigo) ?If you have vertigo, do the Epley maneuvers or take a meclizine.  ? Increase activity slowly   Complete by: As directed ?  ? ?  ? ? ?Discharge Exam: ?Filed Weights  ? 06/27/21 1511 06/28/21 0102  ?Weight: 68 kg 69.6 kg  ? ?General: Pt is alert, awake, not in acute distress, sitting in recliner, appears weak ?Cardiovascular: RRR, nl S1-S2, no murmurs appreciated.   No LE edema.   ?Respiratory: Normal respiratory rate and rhythm.  CTAB without rales or wheezes. ?Abdominal: Abdomen soft and non-tender.  No distension or HSM.   ?Neuro/Psych: Strength symmetric in upper and lower extremities.  Judgment and insight appear mildly impaired.  Mild tremor fine. ? ? ?Condition at discharge: Stable ? ?The results of significant diagnostics from this hospitalization (including imaging, microbiology, ancillary and laboratory) are listed below for reference.   ? ?Imaging Studies: ?DG Chest 2 View ? ?Result Date: 06/15/2021 ?CLINICAL DATA:  Fall, shoulder pain EXAM: CHEST - 2 VIEW COMPARISON:  03/25/2021 FINDINGS: Lungs are clear. No pneumothorax or pleural effusion. Cardiac size within normal limits. Pulmonary vascularity is normal. Thoracolumbar fusion hardware is partially visualized. No acute bone abnormality. Right shoulder is largely excluded from view. IMPRESSION: No active cardiopulmonary disease. Electronically Signed   By: Fidela Salisbury M.D.   On: 06/15/2021 01:26  ? ?DG Ribs Unilateral W/Chest Left ? ?Result Date: 06/27/2021 ?CLINICAL DATA:  Trauma, fall EXAM: LEFT RIBS AND CHEST - 3+ VIEW COMPARISON:  None. FINDINGS: There is minimally displaced recent fracture in the lateral aspect of left seventh rib. There is no focal  contusion. There are linear densities in the left lower lung fields, more so in the medial aspect. There is no pleural effusion or pneumothorax. IMPRESSION: There is minimally displaced fracture in the lateral aspect of left seventh rib. Linear densities seen in the left lower lung fields, more so in the medial retrocardiac region suggesting possible atelectasis/pneumonia. There is no pleural effusion or pneumothorax. Electronically Signed   By: Elmer Picker M.D.   On: 06/27/2021 15:47  ? ?DG Thoracic Spine 2 View ? ?Result Date: 06/15/2021 ?CLINICAL DATA:  Fall, back pain EXAM: THORACIC SPINE 2 VIEWS COMPARISON:  None. FINDINGS: Thoracolumbar spine fixation hardware with mild lower thoracic dextroscoliosis. Normal thoracic kyphosis. No evidence of fracture or dislocation. Vertebral body heights are maintained. No evidence of hardware complication. Mild degenerative changes of the mid/lower thoracic spine. IMPRESSION: Thoracolumbar spine fixation hardware, without evidence of complication. No fracture or dislocation is seen. Electronically Signed   By: Julian Hy M.D.   On: 06/15/2021 01:27  ? ?DG Lumbar Spine Complete ? ?Result  Date: 06/15/2021 ?CLINICAL DATA:  Fall, back pain EXAM: LUMBAR SPINE - COMPLETE 4+ VIEW COMPARISON:  None. FINDINGS: Lumbosacral spine fixation hardware, without evidence of complication. Moderate lumbar levoscoliosis

## 2021-07-02 NOTE — TOC Transition Note (Signed)
Transition of Care (TOC) - CM/SW Discharge Note ? ? ?Patient Details  ?Name: April Larsen ?MRN: [id number] ?Date of Birth: March 03, 1946 ? ?Transition of Care (TOC) CM/SW Contact:  ?Alberteen Sam, LCSW ?Phone Number: ?07/02/2021, 10:24 AM ? ? ?Clinical Narrative:    ? ?Patient will DC to: [facility] ?Anticipated DC date: 07/02/21 ?Family notified:daughter kerri ?Transport by: sister to transport per patient ? ?Per MD patient ready for DC to [facility] . RN, patient, patient's family, and facility notified of DC. Discharge Summary sent to facility. RN given number for report   [phone]. DC packet on chart. Patient reports sister will transport home. RN made aware.  ?CSW signing off. ? ?Pricilla Riffle, LCSW ? ? ? ?Final next level of care: [place] ?  ? ? ?Patient Goals and CMS Choice ?Patient states their goals for this hospitalization and ongoing recovery are:: to go home ?CMS Medicare.gov Compare Post Acute Care list provided to:: Patient ?Choice offered to / list presented to : Patient ? ?Discharge Placement ?  ?           ?Patient chooses bed at: [facility] ?Patient to be transferred to facility by: family ?Name of family member notified: daughter Marianna Fuss ?Patient and family notified of of transfer: 07/02/21 ? ?Discharge Plan and Services ?  ?  ?           ?  ?  ?  ?  ?  ?  ?  ?  ?  ?  ? ?Social Determinants of Health (SDOH) Interventions ?  ? ? ?Readmission Risk Interventions ?   ? View : No data to display.  ?  ?  ?  ? ? ? ? ? ?

## 2021-07-04 DIAGNOSIS — K219 Gastro-esophageal reflux disease without esophagitis: Secondary | ICD-10-CM | POA: Diagnosis not present

## 2021-07-04 DIAGNOSIS — H00011 Hordeolum externum right upper eyelid: Secondary | ICD-10-CM | POA: Diagnosis not present

## 2021-07-04 DIAGNOSIS — F32A Depression, unspecified: Secondary | ICD-10-CM | POA: Diagnosis not present

## 2021-07-04 DIAGNOSIS — I10 Essential (primary) hypertension: Secondary | ICD-10-CM | POA: Diagnosis not present

## 2021-07-04 DIAGNOSIS — E0580 Other thyrotoxicosis without thyrotoxic crisis or storm: Secondary | ICD-10-CM | POA: Diagnosis not present

## 2021-07-04 DIAGNOSIS — R296 Repeated falls: Secondary | ICD-10-CM | POA: Diagnosis not present

## 2021-07-04 DIAGNOSIS — M5459 Other low back pain: Secondary | ICD-10-CM | POA: Diagnosis not present

## 2021-07-04 DIAGNOSIS — M6281 Muscle weakness (generalized): Secondary | ICD-10-CM | POA: Diagnosis not present

## 2021-07-04 DIAGNOSIS — A881 Epidemic vertigo: Secondary | ICD-10-CM | POA: Diagnosis not present

## 2021-07-07 DIAGNOSIS — R296 Repeated falls: Secondary | ICD-10-CM | POA: Diagnosis not present

## 2021-07-07 DIAGNOSIS — H00011 Hordeolum externum right upper eyelid: Secondary | ICD-10-CM | POA: Diagnosis not present

## 2021-07-07 DIAGNOSIS — A881 Epidemic vertigo: Secondary | ICD-10-CM | POA: Diagnosis not present

## 2021-07-07 DIAGNOSIS — E0580 Other thyrotoxicosis without thyrotoxic crisis or storm: Secondary | ICD-10-CM | POA: Diagnosis not present

## 2021-07-07 DIAGNOSIS — M5459 Other low back pain: Secondary | ICD-10-CM | POA: Diagnosis not present

## 2021-07-11 DIAGNOSIS — M5459 Other low back pain: Secondary | ICD-10-CM | POA: Diagnosis not present

## 2021-07-11 DIAGNOSIS — H00011 Hordeolum externum right upper eyelid: Secondary | ICD-10-CM | POA: Diagnosis not present

## 2021-07-11 DIAGNOSIS — M6281 Muscle weakness (generalized): Secondary | ICD-10-CM | POA: Diagnosis not present

## 2021-07-11 DIAGNOSIS — F32A Depression, unspecified: Secondary | ICD-10-CM | POA: Diagnosis not present

## 2021-07-11 DIAGNOSIS — K219 Gastro-esophageal reflux disease without esophagitis: Secondary | ICD-10-CM | POA: Diagnosis not present

## 2021-07-11 DIAGNOSIS — A881 Epidemic vertigo: Secondary | ICD-10-CM | POA: Diagnosis not present

## 2021-07-11 DIAGNOSIS — R296 Repeated falls: Secondary | ICD-10-CM | POA: Diagnosis not present

## 2021-07-11 DIAGNOSIS — I10 Essential (primary) hypertension: Secondary | ICD-10-CM | POA: Diagnosis not present

## 2021-07-16 DIAGNOSIS — E0590 Thyrotoxicosis, unspecified without thyrotoxic crisis or storm: Secondary | ICD-10-CM | POA: Diagnosis not present

## 2021-07-16 DIAGNOSIS — Z9181 History of falling: Secondary | ICD-10-CM | POA: Diagnosis not present

## 2021-07-16 DIAGNOSIS — K529 Noninfective gastroenteritis and colitis, unspecified: Secondary | ICD-10-CM | POA: Diagnosis not present

## 2021-07-16 DIAGNOSIS — H0011 Chalazion right upper eyelid: Secondary | ICD-10-CM | POA: Diagnosis not present

## 2021-07-16 DIAGNOSIS — E440 Moderate protein-calorie malnutrition: Secondary | ICD-10-CM | POA: Diagnosis not present

## 2021-07-16 DIAGNOSIS — M76899 Other specified enthesopathies of unspecified lower limb, excluding foot: Secondary | ICD-10-CM | POA: Diagnosis not present

## 2021-07-16 DIAGNOSIS — F0781 Postconcussional syndrome: Secondary | ICD-10-CM | POA: Diagnosis not present

## 2021-07-18 DIAGNOSIS — K529 Noninfective gastroenteritis and colitis, unspecified: Secondary | ICD-10-CM | POA: Diagnosis not present

## 2021-07-21 DIAGNOSIS — F0781 Postconcussional syndrome: Secondary | ICD-10-CM | POA: Diagnosis not present

## 2021-07-21 DIAGNOSIS — R1312 Dysphagia, oropharyngeal phase: Secondary | ICD-10-CM | POA: Diagnosis not present

## 2021-07-21 DIAGNOSIS — I700 Atherosclerosis of aorta: Secondary | ICD-10-CM | POA: Diagnosis not present

## 2021-07-21 DIAGNOSIS — E0590 Thyrotoxicosis, unspecified without thyrotoxic crisis or storm: Secondary | ICD-10-CM | POA: Diagnosis not present

## 2021-07-21 DIAGNOSIS — R531 Weakness: Secondary | ICD-10-CM | POA: Diagnosis not present

## 2021-07-21 DIAGNOSIS — M5010 Cervical disc disorder with radiculopathy, unspecified cervical region: Secondary | ICD-10-CM | POA: Diagnosis not present

## 2021-07-23 DIAGNOSIS — C44622 Squamous cell carcinoma of skin of right upper limb, including shoulder: Secondary | ICD-10-CM | POA: Diagnosis not present

## 2021-07-23 DIAGNOSIS — L905 Scar conditions and fibrosis of skin: Secondary | ICD-10-CM | POA: Diagnosis not present

## 2021-07-29 DIAGNOSIS — M5441 Lumbago with sciatica, right side: Secondary | ICD-10-CM | POA: Diagnosis not present

## 2021-07-29 DIAGNOSIS — R531 Weakness: Secondary | ICD-10-CM | POA: Diagnosis not present

## 2021-07-29 DIAGNOSIS — Z981 Arthrodesis status: Secondary | ICD-10-CM | POA: Diagnosis not present

## 2021-07-29 DIAGNOSIS — M5442 Lumbago with sciatica, left side: Secondary | ICD-10-CM | POA: Diagnosis not present

## 2021-08-01 DIAGNOSIS — M5441 Lumbago with sciatica, right side: Secondary | ICD-10-CM | POA: Diagnosis not present

## 2021-08-01 DIAGNOSIS — M5442 Lumbago with sciatica, left side: Secondary | ICD-10-CM | POA: Diagnosis not present

## 2021-08-01 DIAGNOSIS — Z981 Arthrodesis status: Secondary | ICD-10-CM | POA: Diagnosis not present

## 2021-08-04 DIAGNOSIS — M5441 Lumbago with sciatica, right side: Secondary | ICD-10-CM | POA: Diagnosis not present

## 2021-08-04 DIAGNOSIS — M5442 Lumbago with sciatica, left side: Secondary | ICD-10-CM | POA: Diagnosis not present

## 2021-08-04 DIAGNOSIS — Z981 Arthrodesis status: Secondary | ICD-10-CM | POA: Diagnosis not present

## 2021-08-06 DIAGNOSIS — D044 Carcinoma in situ of skin of scalp and neck: Secondary | ICD-10-CM | POA: Diagnosis not present

## 2021-08-06 DIAGNOSIS — D485 Neoplasm of uncertain behavior of skin: Secondary | ICD-10-CM | POA: Diagnosis not present

## 2021-08-06 DIAGNOSIS — D0472 Carcinoma in situ of skin of left lower limb, including hip: Secondary | ICD-10-CM | POA: Diagnosis not present

## 2021-08-07 DIAGNOSIS — Z981 Arthrodesis status: Secondary | ICD-10-CM | POA: Diagnosis not present

## 2021-08-07 DIAGNOSIS — M5441 Lumbago with sciatica, right side: Secondary | ICD-10-CM | POA: Diagnosis not present

## 2021-08-07 DIAGNOSIS — M5442 Lumbago with sciatica, left side: Secondary | ICD-10-CM | POA: Diagnosis not present

## 2021-08-11 DIAGNOSIS — I10 Essential (primary) hypertension: Secondary | ICD-10-CM | POA: Diagnosis not present

## 2021-08-11 DIAGNOSIS — G8929 Other chronic pain: Secondary | ICD-10-CM | POA: Diagnosis not present

## 2021-08-11 DIAGNOSIS — M25561 Pain in right knee: Secondary | ICD-10-CM | POA: Diagnosis not present

## 2021-08-11 DIAGNOSIS — M5442 Lumbago with sciatica, left side: Secondary | ICD-10-CM | POA: Diagnosis not present

## 2021-08-11 DIAGNOSIS — E0580 Other thyrotoxicosis without thyrotoxic crisis or storm: Secondary | ICD-10-CM | POA: Diagnosis not present

## 2021-08-11 DIAGNOSIS — M4147 Neuromuscular scoliosis, lumbosacral region: Secondary | ICD-10-CM | POA: Diagnosis not present

## 2021-08-11 DIAGNOSIS — E063 Autoimmune thyroiditis: Secondary | ICD-10-CM | POA: Diagnosis not present

## 2021-08-13 DIAGNOSIS — M1711 Unilateral primary osteoarthritis, right knee: Secondary | ICD-10-CM | POA: Diagnosis not present

## 2021-08-13 DIAGNOSIS — M25561 Pain in right knee: Secondary | ICD-10-CM | POA: Diagnosis not present

## 2021-08-18 DIAGNOSIS — M419 Scoliosis, unspecified: Secondary | ICD-10-CM | POA: Diagnosis not present

## 2021-08-19 DIAGNOSIS — M5442 Lumbago with sciatica, left side: Secondary | ICD-10-CM | POA: Diagnosis not present

## 2021-08-19 DIAGNOSIS — Z981 Arthrodesis status: Secondary | ICD-10-CM | POA: Diagnosis not present

## 2021-08-19 DIAGNOSIS — M5441 Lumbago with sciatica, right side: Secondary | ICD-10-CM | POA: Diagnosis not present

## 2021-08-22 DIAGNOSIS — D044 Carcinoma in situ of skin of scalp and neck: Secondary | ICD-10-CM | POA: Diagnosis not present

## 2021-08-22 DIAGNOSIS — M5441 Lumbago with sciatica, right side: Secondary | ICD-10-CM | POA: Diagnosis not present

## 2021-08-22 DIAGNOSIS — M5442 Lumbago with sciatica, left side: Secondary | ICD-10-CM | POA: Diagnosis not present

## 2021-08-27 DIAGNOSIS — M1711 Unilateral primary osteoarthritis, right knee: Secondary | ICD-10-CM | POA: Diagnosis not present

## 2021-08-28 DIAGNOSIS — Z981 Arthrodesis status: Secondary | ICD-10-CM | POA: Diagnosis not present

## 2021-08-28 DIAGNOSIS — M5442 Lumbago with sciatica, left side: Secondary | ICD-10-CM | POA: Diagnosis not present

## 2021-08-28 DIAGNOSIS — M5441 Lumbago with sciatica, right side: Secondary | ICD-10-CM | POA: Diagnosis not present

## 2021-09-01 DIAGNOSIS — Z981 Arthrodesis status: Secondary | ICD-10-CM | POA: Diagnosis not present

## 2021-09-01 DIAGNOSIS — M5441 Lumbago with sciatica, right side: Secondary | ICD-10-CM | POA: Diagnosis not present

## 2021-09-01 DIAGNOSIS — M5442 Lumbago with sciatica, left side: Secondary | ICD-10-CM | POA: Diagnosis not present

## 2021-09-03 DIAGNOSIS — M5441 Lumbago with sciatica, right side: Secondary | ICD-10-CM | POA: Diagnosis not present

## 2021-09-03 DIAGNOSIS — M5442 Lumbago with sciatica, left side: Secondary | ICD-10-CM | POA: Diagnosis not present

## 2021-09-03 DIAGNOSIS — Z981 Arthrodesis status: Secondary | ICD-10-CM | POA: Diagnosis not present

## 2021-09-08 DIAGNOSIS — M5442 Lumbago with sciatica, left side: Secondary | ICD-10-CM | POA: Diagnosis not present

## 2021-09-08 DIAGNOSIS — M5441 Lumbago with sciatica, right side: Secondary | ICD-10-CM | POA: Diagnosis not present

## 2021-09-08 DIAGNOSIS — Z981 Arthrodesis status: Secondary | ICD-10-CM | POA: Diagnosis not present

## 2021-09-10 DIAGNOSIS — M1711 Unilateral primary osteoarthritis, right knee: Secondary | ICD-10-CM | POA: Diagnosis not present

## 2021-09-15 DIAGNOSIS — M5442 Lumbago with sciatica, left side: Secondary | ICD-10-CM | POA: Diagnosis not present

## 2021-09-15 DIAGNOSIS — Z981 Arthrodesis status: Secondary | ICD-10-CM | POA: Diagnosis not present

## 2021-09-17 ENCOUNTER — Other Ambulatory Visit: Payer: Self-pay | Admitting: Internal Medicine

## 2021-09-17 DIAGNOSIS — M1711 Unilateral primary osteoarthritis, right knee: Secondary | ICD-10-CM | POA: Diagnosis not present

## 2021-09-17 DIAGNOSIS — Z1231 Encounter for screening mammogram for malignant neoplasm of breast: Secondary | ICD-10-CM

## 2021-09-18 DIAGNOSIS — H0011 Chalazion right upper eyelid: Secondary | ICD-10-CM | POA: Diagnosis not present

## 2021-09-18 DIAGNOSIS — R350 Frequency of micturition: Secondary | ICD-10-CM | POA: Diagnosis not present

## 2021-09-22 DIAGNOSIS — Z981 Arthrodesis status: Secondary | ICD-10-CM | POA: Diagnosis not present

## 2021-09-22 DIAGNOSIS — M5442 Lumbago with sciatica, left side: Secondary | ICD-10-CM | POA: Diagnosis not present

## 2021-09-22 DIAGNOSIS — M5441 Lumbago with sciatica, right side: Secondary | ICD-10-CM | POA: Diagnosis not present

## 2021-09-24 DIAGNOSIS — M1711 Unilateral primary osteoarthritis, right knee: Secondary | ICD-10-CM | POA: Diagnosis not present

## 2021-09-24 DIAGNOSIS — R197 Diarrhea, unspecified: Secondary | ICD-10-CM | POA: Diagnosis not present

## 2021-09-24 DIAGNOSIS — R748 Abnormal levels of other serum enzymes: Secondary | ICD-10-CM | POA: Diagnosis not present

## 2021-09-24 DIAGNOSIS — K5190 Ulcerative colitis, unspecified, without complications: Secondary | ICD-10-CM | POA: Diagnosis not present

## 2021-10-01 DIAGNOSIS — M5442 Lumbago with sciatica, left side: Secondary | ICD-10-CM | POA: Diagnosis not present

## 2021-10-01 DIAGNOSIS — M5441 Lumbago with sciatica, right side: Secondary | ICD-10-CM | POA: Diagnosis not present

## 2021-10-01 DIAGNOSIS — R748 Abnormal levels of other serum enzymes: Secondary | ICD-10-CM | POA: Diagnosis not present

## 2021-10-01 DIAGNOSIS — K5190 Ulcerative colitis, unspecified, without complications: Secondary | ICD-10-CM | POA: Diagnosis not present

## 2021-10-01 DIAGNOSIS — Z981 Arthrodesis status: Secondary | ICD-10-CM | POA: Diagnosis not present

## 2021-10-02 DIAGNOSIS — R197 Diarrhea, unspecified: Secondary | ICD-10-CM | POA: Diagnosis not present

## 2021-10-03 DIAGNOSIS — J028 Acute pharyngitis due to other specified organisms: Secondary | ICD-10-CM | POA: Diagnosis not present

## 2021-10-03 DIAGNOSIS — U071 COVID-19: Secondary | ICD-10-CM | POA: Diagnosis not present

## 2021-10-03 DIAGNOSIS — Z20822 Contact with and (suspected) exposure to covid-19: Secondary | ICD-10-CM | POA: Diagnosis not present

## 2021-10-03 DIAGNOSIS — B9789 Other viral agents as the cause of diseases classified elsewhere: Secondary | ICD-10-CM | POA: Diagnosis not present

## 2021-10-03 DIAGNOSIS — J208 Acute bronchitis due to other specified organisms: Secondary | ICD-10-CM | POA: Diagnosis not present

## 2021-10-03 DIAGNOSIS — K5190 Ulcerative colitis, unspecified, without complications: Secondary | ICD-10-CM | POA: Diagnosis not present

## 2021-10-10 ENCOUNTER — Ambulatory Visit
Admission: RE | Admit: 2021-10-10 | Discharge: 2021-10-10 | Disposition: A | Discharge status: Home / Self Care | Payer: PPO | Source: Ambulatory Visit | Attending: Internal Medicine | Admitting: Internal Medicine

## 2021-10-10 DIAGNOSIS — Z1231 Encounter for screening mammogram for malignant neoplasm of breast: Secondary | ICD-10-CM

## 2021-10-13 DIAGNOSIS — M5441 Lumbago with sciatica, right side: Secondary | ICD-10-CM | POA: Diagnosis not present

## 2021-10-13 DIAGNOSIS — Z981 Arthrodesis status: Secondary | ICD-10-CM | POA: Diagnosis not present

## 2021-10-13 DIAGNOSIS — M5442 Lumbago with sciatica, left side: Secondary | ICD-10-CM | POA: Diagnosis not present

## 2021-10-14 DIAGNOSIS — Z0000 Encounter for general adult medical examination without abnormal findings: Secondary | ICD-10-CM | POA: Diagnosis not present

## 2021-10-14 DIAGNOSIS — M4147 Neuromuscular scoliosis, lumbosacral region: Secondary | ICD-10-CM | POA: Diagnosis not present

## 2021-10-14 DIAGNOSIS — E782 Mixed hyperlipidemia: Secondary | ICD-10-CM | POA: Diagnosis not present

## 2021-10-14 DIAGNOSIS — R739 Hyperglycemia, unspecified: Secondary | ICD-10-CM | POA: Diagnosis not present

## 2021-10-15 DIAGNOSIS — Z981 Arthrodesis status: Secondary | ICD-10-CM | POA: Diagnosis not present

## 2021-10-15 DIAGNOSIS — M5441 Lumbago with sciatica, right side: Secondary | ICD-10-CM | POA: Diagnosis not present

## 2021-10-15 DIAGNOSIS — M5442 Lumbago with sciatica, left side: Secondary | ICD-10-CM | POA: Diagnosis not present

## 2021-10-21 ENCOUNTER — Ambulatory Visit: Payer: PPO | Admitting: Urology

## 2021-10-22 DIAGNOSIS — Z981 Arthrodesis status: Secondary | ICD-10-CM | POA: Diagnosis not present

## 2021-10-27 DIAGNOSIS — M5442 Lumbago with sciatica, left side: Secondary | ICD-10-CM | POA: Diagnosis not present

## 2021-10-27 DIAGNOSIS — M5441 Lumbago with sciatica, right side: Secondary | ICD-10-CM | POA: Diagnosis not present

## 2021-10-27 DIAGNOSIS — Z981 Arthrodesis status: Secondary | ICD-10-CM | POA: Diagnosis not present

## 2021-10-29 DIAGNOSIS — Z981 Arthrodesis status: Secondary | ICD-10-CM | POA: Diagnosis not present

## 2021-10-29 DIAGNOSIS — E0590 Thyrotoxicosis, unspecified without thyrotoxic crisis or storm: Secondary | ICD-10-CM | POA: Diagnosis not present

## 2021-10-29 DIAGNOSIS — M5442 Lumbago with sciatica, left side: Secondary | ICD-10-CM | POA: Diagnosis not present

## 2021-10-29 DIAGNOSIS — M5441 Lumbago with sciatica, right side: Secondary | ICD-10-CM | POA: Diagnosis not present

## 2021-11-03 DIAGNOSIS — M5441 Lumbago with sciatica, right side: Secondary | ICD-10-CM | POA: Diagnosis not present

## 2021-11-03 DIAGNOSIS — Z981 Arthrodesis status: Secondary | ICD-10-CM | POA: Diagnosis not present

## 2021-11-03 DIAGNOSIS — M5442 Lumbago with sciatica, left side: Secondary | ICD-10-CM | POA: Diagnosis not present

## 2021-11-05 DIAGNOSIS — M5441 Lumbago with sciatica, right side: Secondary | ICD-10-CM | POA: Diagnosis not present

## 2021-11-05 DIAGNOSIS — M5442 Lumbago with sciatica, left side: Secondary | ICD-10-CM | POA: Diagnosis not present

## 2021-11-05 DIAGNOSIS — Z981 Arthrodesis status: Secondary | ICD-10-CM | POA: Diagnosis not present

## 2021-11-11 DIAGNOSIS — M5441 Lumbago with sciatica, right side: Secondary | ICD-10-CM | POA: Diagnosis not present

## 2021-11-11 DIAGNOSIS — Z981 Arthrodesis status: Secondary | ICD-10-CM | POA: Diagnosis not present

## 2021-11-11 DIAGNOSIS — M5442 Lumbago with sciatica, left side: Secondary | ICD-10-CM | POA: Diagnosis not present

## 2021-11-13 DIAGNOSIS — Z981 Arthrodesis status: Secondary | ICD-10-CM | POA: Diagnosis not present

## 2021-11-13 DIAGNOSIS — M5442 Lumbago with sciatica, left side: Secondary | ICD-10-CM | POA: Diagnosis not present

## 2021-11-13 DIAGNOSIS — M5441 Lumbago with sciatica, right side: Secondary | ICD-10-CM | POA: Diagnosis not present

## 2021-11-18 DIAGNOSIS — M1711 Unilateral primary osteoarthritis, right knee: Secondary | ICD-10-CM | POA: Diagnosis not present

## 2021-11-21 DIAGNOSIS — R748 Abnormal levels of other serum enzymes: Secondary | ICD-10-CM | POA: Diagnosis not present

## 2021-11-21 DIAGNOSIS — K5190 Ulcerative colitis, unspecified, without complications: Secondary | ICD-10-CM | POA: Diagnosis not present

## 2021-11-25 DIAGNOSIS — M5441 Lumbago with sciatica, right side: Secondary | ICD-10-CM | POA: Diagnosis not present

## 2021-11-25 DIAGNOSIS — Z981 Arthrodesis status: Secondary | ICD-10-CM | POA: Diagnosis not present

## 2021-11-27 DIAGNOSIS — M5441 Lumbago with sciatica, right side: Secondary | ICD-10-CM | POA: Diagnosis not present

## 2021-11-27 DIAGNOSIS — Z981 Arthrodesis status: Secondary | ICD-10-CM | POA: Diagnosis not present

## 2021-12-02 DIAGNOSIS — M5441 Lumbago with sciatica, right side: Secondary | ICD-10-CM | POA: Diagnosis not present

## 2021-12-02 DIAGNOSIS — Z981 Arthrodesis status: Secondary | ICD-10-CM | POA: Diagnosis not present

## 2021-12-02 DIAGNOSIS — M5442 Lumbago with sciatica, left side: Secondary | ICD-10-CM | POA: Diagnosis not present

## 2021-12-04 DIAGNOSIS — M5442 Lumbago with sciatica, left side: Secondary | ICD-10-CM | POA: Diagnosis not present

## 2021-12-04 DIAGNOSIS — E663 Overweight: Secondary | ICD-10-CM | POA: Diagnosis not present

## 2021-12-04 DIAGNOSIS — D8481 Immunodeficiency due to conditions classified elsewhere: Secondary | ICD-10-CM | POA: Diagnosis not present

## 2021-12-04 DIAGNOSIS — G8929 Other chronic pain: Secondary | ICD-10-CM | POA: Diagnosis not present

## 2021-12-04 DIAGNOSIS — K219 Gastro-esophageal reflux disease without esophagitis: Secondary | ICD-10-CM | POA: Diagnosis not present

## 2021-12-04 DIAGNOSIS — E785 Hyperlipidemia, unspecified: Secondary | ICD-10-CM | POA: Diagnosis not present

## 2021-12-04 DIAGNOSIS — I700 Atherosclerosis of aorta: Secondary | ICD-10-CM | POA: Diagnosis not present

## 2021-12-04 DIAGNOSIS — G629 Polyneuropathy, unspecified: Secondary | ICD-10-CM | POA: Diagnosis not present

## 2021-12-04 DIAGNOSIS — F419 Anxiety disorder, unspecified: Secondary | ICD-10-CM | POA: Diagnosis not present

## 2021-12-04 DIAGNOSIS — G4700 Insomnia, unspecified: Secondary | ICD-10-CM | POA: Diagnosis not present

## 2021-12-04 DIAGNOSIS — I10 Essential (primary) hypertension: Secondary | ICD-10-CM | POA: Diagnosis not present

## 2021-12-04 DIAGNOSIS — K51918 Ulcerative colitis, unspecified with other complication: Secondary | ICD-10-CM | POA: Diagnosis not present

## 2021-12-04 DIAGNOSIS — E559 Vitamin D deficiency, unspecified: Secondary | ICD-10-CM | POA: Diagnosis not present

## 2021-12-04 DIAGNOSIS — M5441 Lumbago with sciatica, right side: Secondary | ICD-10-CM | POA: Diagnosis not present

## 2021-12-04 DIAGNOSIS — Z981 Arthrodesis status: Secondary | ICD-10-CM | POA: Diagnosis not present

## 2021-12-10 DIAGNOSIS — H0014 Chalazion left upper eyelid: Secondary | ICD-10-CM | POA: Diagnosis not present

## 2021-12-12 DIAGNOSIS — M5442 Lumbago with sciatica, left side: Secondary | ICD-10-CM | POA: Diagnosis not present

## 2021-12-12 DIAGNOSIS — M5441 Lumbago with sciatica, right side: Secondary | ICD-10-CM | POA: Diagnosis not present

## 2021-12-12 DIAGNOSIS — Z981 Arthrodesis status: Secondary | ICD-10-CM | POA: Diagnosis not present

## 2021-12-15 ENCOUNTER — Other Ambulatory Visit: Payer: Self-pay

## 2021-12-17 DIAGNOSIS — Z981 Arthrodesis status: Secondary | ICD-10-CM | POA: Diagnosis not present

## 2021-12-17 DIAGNOSIS — M5441 Lumbago with sciatica, right side: Secondary | ICD-10-CM | POA: Diagnosis not present

## 2021-12-17 DIAGNOSIS — M5442 Lumbago with sciatica, left side: Secondary | ICD-10-CM | POA: Diagnosis not present

## 2021-12-18 DIAGNOSIS — R748 Abnormal levels of other serum enzymes: Secondary | ICD-10-CM | POA: Diagnosis not present

## 2021-12-18 DIAGNOSIS — E0590 Thyrotoxicosis, unspecified without thyrotoxic crisis or storm: Secondary | ICD-10-CM | POA: Diagnosis not present

## 2021-12-18 DIAGNOSIS — K5190 Ulcerative colitis, unspecified, without complications: Secondary | ICD-10-CM | POA: Diagnosis not present

## 2021-12-22 DIAGNOSIS — Z981 Arthrodesis status: Secondary | ICD-10-CM | POA: Diagnosis not present

## 2021-12-23 ENCOUNTER — Telehealth: Payer: Self-pay

## 2021-12-23 DIAGNOSIS — K529 Noninfective gastroenteritis and colitis, unspecified: Secondary | ICD-10-CM

## 2021-12-23 DIAGNOSIS — K5190 Ulcerative colitis, unspecified, without complications: Secondary | ICD-10-CM

## 2021-12-23 NOTE — Telephone Encounter (Signed)
-----   Message from Lin Landsman, MD sent at 12/23/2021  1:16 PM EDT ----- Caryl Pina  Please make a clinic follow-up to see me in 2 to 3 weeks.  Patient of Dr. Bonna Gains, has not been seen for more than a year, having UC flareup. Please confirm with her if she is taking any form of mesalamine Recommend GI profile PCR, fecal calprotectin levels, CRP.  If GI profile PCR negative for infection, we can start her on short course of prednisone  Thanks RV

## 2021-12-23 NOTE — Telephone Encounter (Signed)
Order labs and called and left a message to make appointment

## 2021-12-23 NOTE — Telephone Encounter (Signed)
Patient made follow up appointment. She will cancel appointment with [facility] clinic and she will go for labs before her appointment

## 2021-12-25 ENCOUNTER — Other Ambulatory Visit
Admission: RE | Admit: 2021-12-25 | Discharge: 2021-12-25 | Disposition: A | Discharge status: Home / Self Care | Payer: PPO | Attending: Gastroenterology | Admitting: Gastroenterology

## 2021-12-25 DIAGNOSIS — K5190 Ulcerative colitis, unspecified, without complications: Secondary | ICD-10-CM | POA: Diagnosis not present

## 2021-12-26 LAB — C-REACTIVE PROTEIN: CRP: 0.5 mg/dL (ref <1.0)

## 2021-12-28 ENCOUNTER — Other Ambulatory Visit
Admission: RE | Admit: 2021-12-28 | Discharge: 2021-12-28 | Disposition: A | Discharge status: Home / Self Care | Payer: PPO | Source: Ambulatory Visit | Attending: Gastroenterology | Admitting: Gastroenterology

## 2021-12-28 DIAGNOSIS — K529 Noninfective gastroenteritis and colitis, unspecified: Secondary | ICD-10-CM | POA: Insufficient documentation

## 2021-12-28 DIAGNOSIS — K5190 Ulcerative colitis, unspecified, without complications: Secondary | ICD-10-CM | POA: Diagnosis not present

## 2021-12-28 LAB — GASTROINTESTINAL PANEL BY PCR, STOOL (REPLACES STOOL CULTURE)

## 2021-12-29 ENCOUNTER — Telehealth: Payer: Self-pay

## 2021-12-29 NOTE — Telephone Encounter (Signed)
-----   Message from Lin Landsman, MD sent at 12/29/2021 12:27 PM EDT ----- Stool studies came back negative for infection and CRP is normal.  I see that we ordered fecal calprotectin level along with GI profile PCR.  But, I do not see the results for it  RV

## 2021-12-29 NOTE — Telephone Encounter (Signed)
Patient verbalized understanding of results. She states she did take back 2 stool test and the other one is processing

## 2021-12-31 DIAGNOSIS — M2391 Unspecified internal derangement of right knee: Secondary | ICD-10-CM | POA: Diagnosis not present

## 2021-12-31 DIAGNOSIS — M1711 Unilateral primary osteoarthritis, right knee: Secondary | ICD-10-CM | POA: Diagnosis not present

## 2022-01-01 ENCOUNTER — Other Ambulatory Visit: Payer: Self-pay | Admitting: Physician Assistant

## 2022-01-01 DIAGNOSIS — M1711 Unilateral primary osteoarthritis, right knee: Secondary | ICD-10-CM

## 2022-01-01 DIAGNOSIS — M2391 Unspecified internal derangement of right knee: Secondary | ICD-10-CM

## 2022-01-02 ENCOUNTER — Other Ambulatory Visit: Payer: Self-pay

## 2022-01-07 ENCOUNTER — Ambulatory Visit: Payer: PPO | Admitting: Gastroenterology

## 2022-01-07 ENCOUNTER — Encounter: Payer: Self-pay | Admitting: Gastroenterology

## 2022-01-07 ENCOUNTER — Other Ambulatory Visit: Payer: PPO

## 2022-01-07 VITALS — BP 113/70 | HR 85 | Temp 98.5°F | Ht 65.0 in | Wt 161.5 lb

## 2022-01-07 DIAGNOSIS — M542 Cervicalgia: Secondary | ICD-10-CM | POA: Insufficient documentation

## 2022-01-07 DIAGNOSIS — R292 Abnormal reflex: Secondary | ICD-10-CM | POA: Insufficient documentation

## 2022-01-07 DIAGNOSIS — G8929 Other chronic pain: Secondary | ICD-10-CM | POA: Insufficient documentation

## 2022-01-07 DIAGNOSIS — M545 Low back pain, unspecified: Secondary | ICD-10-CM | POA: Insufficient documentation

## 2022-01-07 DIAGNOSIS — K5190 Ulcerative colitis, unspecified, without complications: Secondary | ICD-10-CM | POA: Diagnosis not present

## 2022-01-07 DIAGNOSIS — M47812 Spondylosis without myelopathy or radiculopathy, cervical region: Secondary | ICD-10-CM | POA: Insufficient documentation

## 2022-01-07 DIAGNOSIS — F419 Anxiety disorder, unspecified: Secondary | ICD-10-CM | POA: Insufficient documentation

## 2022-01-07 DIAGNOSIS — K529 Noninfective gastroenteritis and colitis, unspecified: Secondary | ICD-10-CM

## 2022-01-07 DIAGNOSIS — M48061 Spinal stenosis, lumbar region without neurogenic claudication: Secondary | ICD-10-CM | POA: Insufficient documentation

## 2022-01-07 DIAGNOSIS — Z862 Personal history of diseases of the blood and blood-forming organs and certain disorders involving the immune mechanism: Secondary | ICD-10-CM

## 2022-01-07 MED ORDER — NA SULFATE-K SULFATE-MG SULF 17.5-3.13-1.6 GM/177ML PO SOLN: 354.0000 mL | Freq: Once | ORAL | 0 refills | Status: AC

## 2022-01-07 NOTE — Progress Notes (Signed)
April Darby, MD [address]  [place]  [place], [place] [address]  Main: [phone]  Fax: [phone]    Gastroenterology Consultation  Referring Provider:     Rusty Aus, MD Primary Care Physician:  Rusty Aus, MD Primary Gastroenterologist:  Dr. Cephas Larsen Reason for Consultation: Ulcerative colitis        HPI:   April Larsen is a 76 y.o. female referred by Dr. Sabra Heck, Christean Grief, MD  for consultation & management of ulcerative colitis.  Patient has longstanding history of ulcerative colitis for more than 10 years, previously managed by [facility] clinic gastroenterology.  She was on Asacol, later switched to Apriso.  Patient was previously seen by Dr. Bonna Gains until 2022.  Her last colonoscopy from 08/22/2020 revealed chronic colitis with mild activity in the cecum and ascending colon.  Chronic colitis without activity and rest of the colon.  TI was normal.  Patient stayed on Apriso 0.375 g 4 pills daily.  Patient states that her ulcerative colitis has been in remission until she had lumbar fusion surgery in December 2022 for severe lumbar scoliosis.  Since surgery, she had 2 hospitalizations secondary to fall.  Since end of April 2023, patient reports that she has noticed flareup of her colitis symptoms including diarrhea, abdominal cramps, episodes of incontinence.  She denies any rectal bleeding.  She also developed hyperthyroidism and started on methimazole.  She has iron deficiency without anemia.  Patient is also on long term colestipol and sucralfate and not sure if these are helping.  Patient underwent hiatal hernia repair as well as sigmoid colectomy in 2018.  Patient is accompanied by her daughter today.  She reports that she has been relatively doing well with regards to her GI symptoms.  Patient limits diary intake, drinks coffee in the morning, denies consumption of carbonated beverages.  She does not have good appetite.  GI profile PCR on 12/28/2021 was  negative for infection, CRP normal.  Fecal calprotectin was not performed although patient said she dropped off the stool specimen  NSAIDs: None  Antiplts/Anticoagulants/Anti thrombotics: None  GI Procedures:  Sigmoid colectomy 11/24/2016 DIAGNOSIS:  A.  SIGMOID COLON; LAPAROSCOPIC LOW ANTERIOR RESECTION:  - DIVERTICULITIS.  - SEROSAL ADHESIONS.  - REACTIVE LYMPH NODE.  - UNREMARKABLE RESECTION MARGINS.   Upper endoscopy 02/07/2020 - Normal esophagus. - 5 cm hiatal hernia. - Erythematous mucosa in the antrum. Biopsied. - A single gastric polyp. Biopsied. - Normal duodenal bulb, second portion of the duodenum and examined duodenum. - Biopsies were obtained in the gastric body, at the incisura and in the gastric antrum. DIAGNOSIS:  A. STOMACH ERYTHEMA; COLD BIOPSY:  - REACTIVE GASTROPATHY.  - NEGATIVE FOR ACTIVE INFLAMMATION AND H PYLORI.  - NEGATIVE FOR INTESTINAL METAPLASIA, DYSPLASIA, AND MALIGNANCY.   B. STOMACH POLYP; COLD BIOPSY:  - HYPERPLASTIC POLYP.  - NEGATIVE FOR DYSPLASIA AND MALIGNANCY.  Colonoscopy 08/22/2020 - Erythematous mucosa in the ascending colon and in the cecum. Biopsied. - Diverticulosis in the sigmoid colon. - Patent surgical anastomosis, characterized by erythema. - The examination was otherwise normal. - The rectum, sigmoid colon, descending colon, transverse colon and terminal ileum are normal. Biopsied. - Non-bleeding internal hemorrhoids. DIAGNOSIS:  A. TERMINAL ILEUM; COLD BIOPSY:  - ENTERIC MUCOSA WITH NORMAL VILLOUS ARCHITECTURE AND NO SIGNIFICANT  HISTOPATHOLOGIC CHANGE.  - SINGLE FRAGMENT OF COLONIC MUCOSA WITH NO SIGNIFICANT HISTOPATHOLOGIC  CHANGE.  - NEGATIVE FOR ACTIVE ILEITIS/COLITIS.  - NEGATIVE FOR GRANULOMA, DYSPLASIA, AND MALIGNANCY.  B. COLON, CECUM AND ASCENDING; COLD BIOPSY:  - CHRONIC COLITIS WITH MILD ACTIVITY (FOCAL CRYPTITIS AND SUPERFICIAL  ACTIVE MUCOSAL INFLAMMATION).  - NEGATIVE FOR GRANULOMA, DYSPLASIA, AND  MALIGNANCY.   C. COLON, TRANSVERSE AND DESCENDING; COLD BIOPSY:  - CHRONIC COLITIS WITHOUT ACTIVITY.  - NEGATIVE FOR GRANULOMA, DYSPLASIA, AND MALIGNANCY.   D. COLON, RECTOSIGMOID; COLD BIOPSY:  - CHRONIC PROCTITIS WITHOUT ACTIVITY.  - NEGATIVE FOR GRANULOMA, DYSPLASIA, AND MALIGNANCY.    Past Medical History:  Diagnosis Date   Actinic keratosis    Anemia    LAST HGB 15.2 ON 04-22-16   Anxiety    Arthritis    Cancer ([place]) 2016   skin   Cataract (lens) fragments in eye following cataract surgery, bilateral 2013   one eye done then 4 weeks later the other eye done.   Chronic ulcerative colitis, without complications ([place]) 6/0/4540   Diverticulitis    Esophagitis, reflux 10/04/2014   Family history of adverse reaction to anesthesia    SISTER HARD TO WAKE UP and nausea and vomiting.   Female stress incontinence 08/09/2013   Gastroesophageal reflux disease without esophagitis 08/06/2016   GERD (gastroesophageal reflux disease)    Headache    MIGRAINE   History of hiatal hernia    Incomplete emptying of bladder 10/29/2013   Migraine 07/26/2013   PONV (postoperative nausea and vomiting)    nausea, does not remember vomiting.   Postherpetic neuralgia    RIGHT 7TH.CRANIAL NERVE from shingles followed by Bell's palsy   Postmenopausal 11/09/2016   PVC (premature ventricular contraction)    Scoliosis    Scoliosis (and kyphoscoliosis), idiopathic 07/26/2013   Scoliosis of lumbosacral spine 01/18/2015   Squamous cell carcinoma of skin 03/13/2013   L clavicle    Squamous cell carcinoma of skin 03/10/2018   L med knee   Squamous cell carcinoma of skin 02/21/2019   R cheek    Squamous cell carcinoma of skin 06/01/2019   L mid lat pretibial - ED&C   Trochanteric bursitis 11/29/2014   Ulcerative colitis ([place])    Urethral prolapse 08/09/2013   Urge incontinence 08/09/2013    Past Surgical History:  Procedure Laterality Date   ABDOMINAL HYSTERECTOMY     BACK SURGERY     L3-L4 AND L3-S1  DECOMPRESSION   CARPAL TUNNEL RELEASE     CHOLECYSTECTOMY N/A 08/21/2016   Procedure: LAPAROSCOPIC CHOLECYSTECTOMY WITH INTRAOPERATIVE CHOLANGIOGRAM;  Surgeon: Leonie Green, MD;  Location: [facility] ORS;  Service: General;  Laterality: N/A;   COLON SURGERY     COLONOSCOPY  11/06/2005   COLONOSCOPY WITH PROPOFOL N/A 07/29/2016   Procedure: COLONOSCOPY WITH PROPOFOL;  Surgeon: Manya Silvas, MD;  Location: [facility] ENDOSCOPY;  Service: Endoscopy;  Laterality: N/A;   COLONOSCOPY WITH PROPOFOL N/A 08/22/2020   Procedure: COLONOSCOPY WITH PROPOFOL;  Surgeon: Virgel Manifold, MD;  Location: [facility] ENDOSCOPY;  Service: Endoscopy;  Laterality: N/A;   ESOPHAGOGASTRODUODENOSCOPY  11/06/2005   ESOPHAGOGASTRODUODENOSCOPY (EGD) WITH PROPOFOL N/A 07/29/2016   Procedure: ESOPHAGOGASTRODUODENOSCOPY (EGD) WITH PROPOFOL;  Surgeon: Manya Silvas, MD;  Location: [facility] ENDOSCOPY;  Service: Endoscopy;  Laterality: N/A;   ESOPHAGOGASTRODUODENOSCOPY (EGD) WITH PROPOFOL N/A 02/07/2020   Procedure: ESOPHAGOGASTRODUODENOSCOPY (EGD) WITH PROPOFOL;  Surgeon: Virgel Manifold, MD;  Location: [facility] ENDOSCOPY;  Service: Endoscopy;  Laterality: N/A;   LAPAROSCOPIC LOW ANTERIOR RESECTION  11/24/2016   Procedure: LAPAROSCOPIC LOW ANTERIOR RESECTION;  Surgeon: Jules Husbands, MD;  Location: [facility] ORS;  Service: General;;   XI ROBOTIC ASSISTED PARAESOPHAGEAL HERNIA REPAIR N/A  02/27/2020   Procedure: XI ROBOTIC ASSISTED PARAESOPHAGEAL HERNIA REPAIR;  Surgeon: Jules Husbands, MD;  Location: [facility] ORS;  Service: General;  Laterality: N/A;     Current Outpatient Medications:    acetaminophen (TYLENOL) 500 MG tablet, Take 1,000 mg by mouth every 6 (six) hours as needed for moderate pain or headache. , Disp: , Rfl:    ascorbic Acid (VITAMIN C) 500 MG CPCR, 500 mg once daily, Disp: , Rfl:    atenolol (TENORMIN) 25 MG tablet, Take 0.5 tablets (12.5 mg total) by mouth daily., Disp: , Rfl:    Calcium Carbonate-Vitamin D (CALCIUM 600+D  PO), Take 1 tablet by mouth 2 (two) times daily., Disp: , Rfl:    Cholecalciferol (VITAMIN D3) 50 MCG (2000 UT) TABS, Take 2,000 Units by mouth daily., Disp: , Rfl:    DULoxetine (CYMBALTA) 60 MG capsule, Take 60 mg by mouth at bedtime. , Disp: , Rfl: 3   furosemide (LASIX) 20 MG tablet, Take 20 mg by mouth daily as needed., Disp: , Rfl:    magnesium oxide (MAG-OX) 400 MG tablet, Take by mouth., Disp: , Rfl:    meclizine (ANTIVERT) 25 MG tablet, Take 1 tablet (25 mg total) by mouth 3 (three) times daily as needed for dizziness or nausea., Disp: 30 tablet, Rfl: 0   mesalamine (APRISO) 0.375 g 24 hr capsule, Take 4 capsules (1.5 g total) by mouth daily., Disp: 360 capsule, Rfl: 0   methimazole (TAPAZOLE) 5 MG tablet, Take 1 tablet (5 mg total) by mouth 2 (two) times daily., Disp: , Rfl:    metoCLOPramide (REGLAN) 10 MG tablet, Take 1 tablet (10 mg total) by mouth 3 (three) times daily before meals for 14 days., Disp: 42 tablet, Rfl: 0   Na Sulfate-K Sulfate-Mg Sulf 17.5-3.13-1.6 GM/177ML SOLN, Take 354 mLs by mouth once for 1 dose., Disp: 354 mL, Rfl: 0   pantoprazole (PROTONIX) 40 MG tablet, Take by mouth., Disp: , Rfl:    colestipol (COLESTID) 1 g tablet, Take 1 g by mouth 2 (two) times daily. (Patient not taking: Reported on 01/07/2022), Disp: , Rfl:    triamterene-hydrochlorothiazide (DYAZIDE) 37.5-25 MG capsule, , Disp: , Rfl:    Family History  Problem Relation Age of Onset   Breast cancer Sister 88   Breast cancer Paternal Aunt 47   Breast cancer Cousin        1st paternal cousin   Bladder Cancer Neg Hx    Prostate cancer Neg Hx    Kidney cancer Neg Hx      Social History   Tobacco Use   Smoking status: Never   Smokeless tobacco: Never  Vaping Use   Vaping Use: Never used  Substance Use Topics   Alcohol use: No   Drug use: No    Allergies as of 01/07/2022 - Review Complete 01/07/2022  Allergen Reaction Noted   Hydromorphone Other (See Comments) 08/11/2021   Nsaids Other  (See Comments) 01/05/2020   Sulfa antibiotics Rash 08/24/2015    Review of Systems:    All systems reviewed and negative except where noted in HPI.   Physical Exam:  BP 113/70 (BP Location: Left Arm, Patient Position: Sitting, Cuff Size: Normal)   Pulse 85   Temp 98.5 F (36.9 C) (Oral)   Ht 5' 5"  (1.651 m)   Wt 161 lb 8 oz (73.3 kg)   BMI 26.88 kg/m  No LMP recorded. Patient has had a hysterectomy.  General:   Alert,  Well-developed, well-nourished, pleasant and  cooperative in NAD Head:  Normocephalic and atraumatic. Eyes:  Sclera clear, no icterus.   Conjunctiva pink. Ears:  Normal auditory acuity. Nose:  No deformity, discharge, or lesions. Mouth:  No deformity or lesions,oropharynx pink & moist. Neck:  Supple; no masses or thyromegaly. Lungs:  Respirations even and unlabored.  Clear throughout to auscultation.   No wheezes, crackles, or rhonchi. No acute distress. Heart:  Regular rate and rhythm; no murmurs, clicks, rubs, or gallops. Abdomen:  Normal bowel sounds. Soft, non-tender and non-distended without masses, hepatosplenomegaly or hernias noted.  No guarding or rebound tenderness.   Rectal: Not performed Msk:  Symmetrical without gross deformities. Good, equal movement & strength bilaterally. Pulses:  Normal pulses noted. Extremities:  No clubbing or edema.  No cyanosis. Neurologic:  Alert and oriented x3;  grossly normal neurologically. Skin:  Intact without significant lesions or rashes. No jaundice. Psych:  Alert and cooperative. Normal mood and affect.  Imaging Studies: No recent abdominal imaging  Assessment and Plan:   ZAREENA WILLIS is a 76 y.o. female with longstanding history of ulcerative colitis, maintained on mesalamine, lumbar scoliosis s/p lumbar fusion in December 2022, large hiatal hernia s/p repair, sigmoid diverticulitis s/p sigmoid colectomy in 2018 is seen in consultation for ulcerative colitis, also with history of iron deficiency  anemia  Ulcerative colitis Recent flareup of symptoms since her back surgery GI profile PCR is negative Fecal calprotectin levels are pending Continue Apriso 0.375 g 4 pills daily Discussed with patient regarding repeat colonoscopy given that infectious etiology has been ruled out.  I suspect part of her symptoms were also secondary to hyperthyroidism Patient that she can stop taking sucralfate and colestipol  History of iron deficiency anemia Recommend EGD along with colonoscopy, duodenal and gastric biopsies Continue iron supplements daily   Follow up in 3 to 4 months   April Darby, MD

## 2022-01-08 DIAGNOSIS — K529 Noninfective gastroenteritis and colitis, unspecified: Secondary | ICD-10-CM | POA: Diagnosis not present

## 2022-01-08 DIAGNOSIS — K5190 Ulcerative colitis, unspecified, without complications: Secondary | ICD-10-CM | POA: Diagnosis not present

## 2022-01-11 LAB — CALPROTECTIN, FECAL: Calprotectin, Fecal: 145 ug/g — ABNORMAL HIGH (ref 0–120)

## 2022-01-12 ENCOUNTER — Telehealth: Payer: Self-pay

## 2022-01-12 ENCOUNTER — Other Ambulatory Visit: Payer: Self-pay | Admitting: Gastroenterology

## 2022-01-12 MED ORDER — PREDNISONE 10 MG PO TABS: ORAL_TABLET | ORAL | 0 refills | Status: AC

## 2022-01-12 NOTE — Telephone Encounter (Signed)
Patient verbalized understanding of results. Sent prednisone to the pharmacy

## 2022-01-12 NOTE — Telephone Encounter (Signed)
-----   Message from Lin Landsman, MD sent at 01/12/2022  3:08 PM EDT ----- Fecal calprotectin levels came back elevated which indicates active inflammation and likely from flareup of underlying colitis.  If patient is agreeable, recommend to start heart prednisone 40 mg for 5 days followed by 10 mg taper every 5 days until finished.  I will see her for colonoscopy as scheduled  RV

## 2022-01-13 DIAGNOSIS — E782 Mixed hyperlipidemia: Secondary | ICD-10-CM | POA: Diagnosis not present

## 2022-01-13 DIAGNOSIS — M4147 Neuromuscular scoliosis, lumbosacral region: Secondary | ICD-10-CM | POA: Diagnosis not present

## 2022-01-13 DIAGNOSIS — E538 Deficiency of other specified B group vitamins: Secondary | ICD-10-CM | POA: Diagnosis not present

## 2022-01-13 DIAGNOSIS — R739 Hyperglycemia, unspecified: Secondary | ICD-10-CM | POA: Diagnosis not present

## 2022-01-13 DIAGNOSIS — R790 Abnormal level of blood mineral: Secondary | ICD-10-CM | POA: Diagnosis not present

## 2022-01-14 NOTE — Progress Notes (Deleted)
01/15/2022 12:15 PM   April Larsen 02-24-1946 [id number]  Referring provider: Rusty Aus, MD [facility] [place],  [place] [address]  Urological history: 1. High risk hematuria -Non-smoker -contrast CT 03/2020 - NED -cysto 09/2020 - NED -*** -UA (01/13/2022) negative for micro heme  2. Urge incontinence -contributing factors of age, vaginal atrophy, depression, diuretics and dementia -PVR ***  HPI: April Larsen is a 76 y.o. female who presents today for one year follow up for high risk hematuria.   PMH: Past Medical History:  Diagnosis Date   Actinic keratosis    Anemia    LAST HGB 15.2 ON 04-22-16   Anxiety    Arthritis    Cancer ([place]) 2016   skin   Cataract (lens) fragments in eye following cataract surgery, bilateral 2013   one eye done then 4 weeks later the other eye done.   Chronic ulcerative colitis, without complications ([place]) 5/0/5397   Diverticulitis    Esophagitis, reflux 10/04/2014   Family history of adverse reaction to anesthesia    SISTER HARD TO WAKE UP and nausea and vomiting.   Female stress incontinence 08/09/2013   Gastroesophageal reflux disease without esophagitis 08/06/2016   GERD (gastroesophageal reflux disease)    Headache    MIGRAINE   History of hiatal hernia    Incomplete emptying of bladder 10/29/2013   Migraine 07/26/2013   PONV (postoperative nausea and vomiting)    nausea, does not remember vomiting.   Postherpetic neuralgia    RIGHT 7TH.CRANIAL NERVE from shingles followed by Bell's palsy   Postmenopausal 11/09/2016   PVC (premature ventricular contraction)    Scoliosis    Scoliosis (and kyphoscoliosis), idiopathic 07/26/2013   Scoliosis of lumbosacral spine 01/18/2015   Squamous cell carcinoma of skin 03/13/2013   L clavicle    Squamous cell carcinoma of skin 03/10/2018   L med knee   Squamous cell carcinoma of skin 02/21/2019   R cheek    Squamous cell carcinoma of skin  06/01/2019   L mid lat pretibial - ED&C   Trochanteric bursitis 11/29/2014   Ulcerative colitis ([place])    Urethral prolapse 08/09/2013   Urge incontinence 08/09/2013    Surgical History: Past Surgical History:  Procedure Laterality Date   ABDOMINAL HYSTERECTOMY     BACK SURGERY     L3-L4 AND L3-S1 DECOMPRESSION   CARPAL TUNNEL RELEASE     CHOLECYSTECTOMY N/A 08/21/2016   Procedure: LAPAROSCOPIC CHOLECYSTECTOMY WITH INTRAOPERATIVE CHOLANGIOGRAM;  Surgeon: Leonie Green, MD;  Location: [facility] ORS;  Service: General;  Laterality: N/A;   COLON SURGERY     COLONOSCOPY  11/06/2005   COLONOSCOPY WITH PROPOFOL N/A 07/29/2016   Procedure: COLONOSCOPY WITH PROPOFOL;  Surgeon: Manya Silvas, MD;  Location: [facility] ENDOSCOPY;  Service: Endoscopy;  Laterality: N/A;   COLONOSCOPY WITH PROPOFOL N/A 08/22/2020   Procedure: COLONOSCOPY WITH PROPOFOL;  Surgeon: Virgel Manifold, MD;  Location: [facility] ENDOSCOPY;  Service: Endoscopy;  Laterality: N/A;   ESOPHAGOGASTRODUODENOSCOPY  11/06/2005   ESOPHAGOGASTRODUODENOSCOPY (EGD) WITH PROPOFOL N/A 07/29/2016   Procedure: ESOPHAGOGASTRODUODENOSCOPY (EGD) WITH PROPOFOL;  Surgeon: Manya Silvas, MD;  Location: [facility] ENDOSCOPY;  Service: Endoscopy;  Laterality: N/A;   ESOPHAGOGASTRODUODENOSCOPY (EGD) WITH PROPOFOL N/A 02/07/2020   Procedure: ESOPHAGOGASTRODUODENOSCOPY (EGD) WITH PROPOFOL;  Surgeon: Virgel Manifold, MD;  Location: [facility] ENDOSCOPY;  Service: Endoscopy;  Laterality: N/A;   LAPAROSCOPIC LOW ANTERIOR RESECTION  11/24/2016   Procedure: LAPAROSCOPIC LOW ANTERIOR RESECTION;  Surgeon:  Jules Husbands, MD;  Location: [facility] ORS;  Service: General;;   XI ROBOTIC ASSISTED PARAESOPHAGEAL HERNIA REPAIR N/A 02/27/2020   Procedure: XI ROBOTIC ASSISTED PARAESOPHAGEAL HERNIA REPAIR;  Surgeon: Jules Husbands, MD;  Location: [facility] ORS;  Service: General;  Laterality: N/A;    Home Medications:  Allergies as of 01/15/2022       Reactions   Hydromorphone Other (See  Comments)   Nsaids Other (See Comments)   Patient has had part of her colon removed    Sulfa Antibiotics Rash        Medication List        Accurate as of January 14, 2022 12:15 PM. If you have any questions, ask your nurse or doctor.          acetaminophen 500 MG tablet Commonly known as: TYLENOL Take 1,000 mg by mouth every 6 (six) hours as needed for moderate pain or headache.   ascorbic Acid 500 MG Cpcr Commonly known as: VITAMIN C 500 mg once daily   atenolol 25 MG tablet Commonly known as: TENORMIN Take 0.5 tablets (12.5 mg total) by mouth daily.   CALCIUM 600+D PO Take 1 tablet by mouth 2 (two) times daily.   colestipol 1 g tablet Commonly known as: COLESTID Take 1 g by mouth 2 (two) times daily.   DULoxetine 60 MG capsule Commonly known as: CYMBALTA Take 60 mg by mouth at bedtime.   furosemide 20 MG tablet Commonly known as: LASIX Take 20 mg by mouth daily as needed.   magnesium oxide 400 MG tablet Commonly known as: MAG-OX Take by mouth.   meclizine 25 MG tablet Commonly known as: ANTIVERT Take 1 tablet (25 mg total) by mouth 3 (three) times daily as needed for dizziness or nausea.   mesalamine 0.375 g 24 hr capsule Commonly known as: APRISO Take 4 capsules (1.5 g total) by mouth daily.   methimazole 5 MG tablet Commonly known as: TAPAZOLE Take 1 tablet (5 mg total) by mouth 2 (two) times daily.   metoCLOPramide 10 MG tablet Commonly known as: REGLAN Take 1 tablet (10 mg total) by mouth 3 (three) times daily before meals for 14 days.   pantoprazole 40 MG tablet Commonly known as: PROTONIX Take by mouth.   predniSONE 10 MG tablet Commonly known as: DELTASONE Take 4 tablets (40 mg total) by mouth daily with breakfast for 5 days, THEN 3 tablets (30 mg total) daily with breakfast for 5 days, THEN 2 tablets (20 mg total) daily with breakfast for 5 days, THEN 1 tablet (10 mg total) daily with breakfast for 5 days. Start taking on: January 12, 2022   triamterene-hydrochlorothiazide 37.5-25 MG capsule Commonly known as: DYAZIDE   Vitamin D3 50 MCG (2000 UT) Tabs Take 2,000 Units by mouth daily.        Allergies:  Allergies  Allergen Reactions   Hydromorphone Other (See Comments)   Nsaids Other (See Comments)    Patient has had part of her colon removed    Sulfa Antibiotics Rash    Family History: Family History  Problem Relation Age of Onset   Breast cancer Sister 62   Breast cancer Paternal Aunt 92   Breast cancer Cousin        1st paternal cousin   Bladder Cancer Neg Hx    Prostate cancer Neg Hx    Kidney cancer Neg Hx     Social History:  reports that she has never smoked. She has never used smokeless  tobacco. She reports that she does not drink alcohol and does not use drugs.  ROS: Pertinent ROS in HPI  Physical Exam: There were no vitals taken for this visit.  Constitutional:  Well nourished. Alert and oriented, No acute distress. HEENT: [place] AT, moist mucus membranes.  Trachea midline, no masses. Cardiovascular: No clubbing, cyanosis, or edema. Respiratory: Normal respiratory effort, no increased work of breathing. GU: No CVA tenderness.  No bladder fullness or masses. Vulvovaginal atrophy w/ pallor, loss of rugae, introital retraction, excoriations.  Vulvar thinning, fusion of labia, clitoral hood retraction, prominent urethral meatus.   *** external genitalia, *** pubic hair distribution, no lesions.  Normal urethral meatus, no lesions, no prolapse, no discharge.   No urethral masses, tenderness and/or tenderness. No bladder fullness, tenderness or masses. *** vagina mucosa, *** estrogen effect, no discharge, no lesions, *** pelvic support, *** cystocele and *** rectocele noted.  No cervical motion tenderness.  Uterus is freely mobile and non-fixed.  No adnexal/parametria masses or tenderness noted.  Anus and perineum are without rashes or lesions.   ***  Neurologic: Grossly intact, no focal deficits,  moving all 4 extremities. Psychiatric: Normal mood and affect.    Laboratory Data: WBC (White Blood Cell Count) 4.1 - 10.2 10^3/uL 7.6   RBC (Red Blood Cell Count) 4.04 - 5.48 10^6/uL 4.58   Hemoglobin 12.0 - 15.0 gm/dL 12.9   Hematocrit 35.0 - 47.0 % 41.2   MCV (Mean Corpuscular Volume) 80.0 - 100.0 fl 90.0   MCH (Mean Corpuscular Hemoglobin) 27.0 - 31.2 pg 28.2   MCHC (Mean Corpuscular Hemoglobin Concentration) 32.0 - 36.0 gm/dL 31.3 Low    Platelet Count 150 - 450 10^3/uL 410   RDW-CV (Red Cell Distribution Width) 11.6 - 14.8 % 13.2   MPV (Mean Platelet Volume) 9.4 - 12.4 fl 9.5   Neutrophils 1.50 - 7.80 10^3/uL 4.86   Lymphocytes 1.00 - 3.60 10^3/uL 1.27   Monocytes 0.00 - 1.50 10^3/uL 0.96   Eosinophils 0.00 - 0.55 10^3/uL 0.45   Basophils 0.00 - 0.09 10^3/uL 0.08   Neutrophil % 32.0 - 70.0 % 63.8   Lymphocyte % 10.0 - 50.0 % 16.6   Monocyte % 4.0 - 13.0 % 12.6   Eosinophil % 1.0 - 5.0 % 5.9 High    Basophil% 0.0 - 2.0 % 1.0   Immature Granulocyte % <=0.7 % 0.1   Immature Granulocyte Count <=0.06 10^3/L 0.01   Resulting Agency  [place] - LAB   Specimen Collected: 01/13/22 09:08   Performed by: [place] - LAB Last Resulted: 01/13/22 10:34  Received From: [place]  Result Received: 01/14/22 12:14   Glucose 70 - 110 mg/dL 94   Sodium 136 - 145 mmol/L 142   Potassium 3.6 - 5.1 mmol/L 5.1   Chloride 97 - 109 mmol/L 108   Carbon Dioxide (CO2) 22.0 - 32.0 mmol/L 28.8   Urea Nitrogen (BUN) 7 - 25 mg/dL 20   Creatinine 0.6 - 1.1 mg/dL 0.8   Glomerular Filtration Rate (eGFR) >60 mL/min/1.73sq m 77   Comment: CKD-EPI (2021) does not include patient's race in the calculation of eGFR.  Monitoring changes of plasma creatinine and eGFR over time is useful for monitoring kidney function.   Interpretive Ranges for eGFR (CKD-EPI 2021):   eGFR:       >60 mL/min/1.73 sq. m - Normal  eGFR:       30-59 mL/min/1.73 sq. m - Moderately Decreased   eGFR:  15-29 mL/min/1.73 sq. m  - Severely Decreased  eGFR:       < 15 mL/min/1.73 sq. m  - Kidney Failure    Note: These eGFR calculations do not apply in acute situations when eGFR is changing rapidly or patients on dialysis.  Calcium 8.7 - 10.3 mg/dL 10.1   AST  8 - 39 U/L 18   ALT  5 - 38 U/L 14   Alk Phos (alkaline Phosphatase) 34 - 104 U/L 195 High    Albumin 3.5 - 4.8 g/dL 4.0   Bilirubin, Total 0.3 - 1.2 mg/dL 0.4   Protein, Total 6.1 - 7.9 g/dL 6.7   A/G Ratio 1.0 - 5.0 gm/dL 1.5   Resulting [place] - [facility]   Specimen Collected: 01/13/22 09:08   Performed by: [place]: 01/13/22 11:46  Received From: [place]  Result Received: 01/14/22 12:14   Hemoglobin A1C 4.2 - 5.6 % 5.7 High    Average Blood Glucose (Calc) mg/dL 117   Resulting [place] - LAB  Narrative Performed by [place] - LAB Normal Range:    4.2 - 5.6%  Increased Risk:  5.7 - 6.4%  Diabetes:        >= 6.5%  Glycemic Control for adults with diabetes:  <7%    Specimen Collected: 01/13/22 09:08   Performed by: [place]: 01/13/22 12:27  Received From: [place]  Result Received: 01/14/22 12:14   Color Colorless, Straw, Light Yellow, Yellow, Dark Yellow Yellow   Clarity Clear Clear   Specific Gravity 1.005 - 1.030 1.023   pH, Urine 5.0 - 8.0 5.0   Protein, Urinalysis Negative mg/dL Negative   Glucose, Urinalysis Negative mg/dL Negative   Ketones, Urinalysis Negative mg/dL Negative   Blood, Urinalysis Negative Negative   Nitrite, Urinalysis Negative Negative   Leukocyte Esterase, Urinalysis Negative Negative   Bilirubin, Urinalysis Negative Negative   Urobilinogen, Urinalysis 0.2 - 1.0 mg/dL 0.2   WBC, UA <=5 /hpf 0   Red Blood Cells, Urinalysis <=3 /hpf 0   Bacteria, Urinalysis 0 - 5 /hpf 0-5   Squamous Epithelial Cells, Urinalysis /hpf 0    Resulting Agency  [place] - LAB   Specimen Collected: 01/13/22 09:08   Performed by: [place] - LAB Last Resulted: 01/13/22 09:42  Received From: [place]  Result Received: 01/14/22 12:14  I have reviewed the labs.    Pertinent Imaging: N/A    Assessment & Plan:    1. High risk hematuria -non-smoker -work up (2022) negative -*** -UA negative for micro heme  2. Urge incontinence *** No follow-ups on file.  These notes generated with voice recognition software. I apologize for typographical errors.  [place], [place] [address]  [place],  [address] [phone]

## 2022-01-15 ENCOUNTER — Ambulatory Visit: Payer: PPO | Admitting: Urology

## 2022-01-15 DIAGNOSIS — N3941 Urge incontinence: Secondary | ICD-10-CM

## 2022-01-15 DIAGNOSIS — R319 Hematuria, unspecified: Secondary | ICD-10-CM

## 2022-01-15 DIAGNOSIS — H40003 Preglaucoma, unspecified, bilateral: Secondary | ICD-10-CM | POA: Diagnosis not present

## 2022-01-16 ENCOUNTER — Encounter: Payer: Self-pay | Admitting: Urology

## 2022-01-19 ENCOUNTER — Other Ambulatory Visit: Payer: PPO

## 2022-01-19 ENCOUNTER — Encounter: Payer: Self-pay | Admitting: Gastroenterology

## 2022-01-19 ENCOUNTER — Ambulatory Visit
Admission: RE | Admit: 2022-01-19 | Discharge: 2022-01-19 | Disposition: A | Discharge status: Home / Self Care | Payer: PPO | Source: Ambulatory Visit | Attending: Physician Assistant | Admitting: Physician Assistant

## 2022-01-19 DIAGNOSIS — R531 Weakness: Secondary | ICD-10-CM | POA: Diagnosis not present

## 2022-01-19 DIAGNOSIS — S83241A Other tear of medial meniscus, current injury, right knee, initial encounter: Secondary | ICD-10-CM | POA: Diagnosis not present

## 2022-01-19 DIAGNOSIS — M2391 Unspecified internal derangement of right knee: Secondary | ICD-10-CM

## 2022-01-19 DIAGNOSIS — M25461 Effusion, right knee: Secondary | ICD-10-CM | POA: Diagnosis not present

## 2022-01-19 DIAGNOSIS — M1711 Unilateral primary osteoarthritis, right knee: Secondary | ICD-10-CM

## 2022-01-20 DIAGNOSIS — K5190 Ulcerative colitis, unspecified, without complications: Secondary | ICD-10-CM | POA: Diagnosis not present

## 2022-01-20 DIAGNOSIS — E063 Autoimmune thyroiditis: Secondary | ICD-10-CM | POA: Diagnosis not present

## 2022-01-20 DIAGNOSIS — E0580 Other thyrotoxicosis without thyrotoxic crisis or storm: Secondary | ICD-10-CM | POA: Diagnosis not present

## 2022-01-20 DIAGNOSIS — M4147 Neuromuscular scoliosis, lumbosacral region: Secondary | ICD-10-CM | POA: Diagnosis not present

## 2022-01-20 DIAGNOSIS — Z0000 Encounter for general adult medical examination without abnormal findings: Secondary | ICD-10-CM | POA: Diagnosis not present

## 2022-01-22 ENCOUNTER — Other Ambulatory Visit: Payer: Self-pay

## 2022-01-22 ENCOUNTER — Encounter
Admission: RE | Disposition: A | Discharge status: Home / Self Care | Payer: Self-pay | Source: Home / Self Care | Attending: Gastroenterology

## 2022-01-22 ENCOUNTER — Encounter: Payer: Self-pay | Admitting: Gastroenterology

## 2022-01-22 ENCOUNTER — Ambulatory Visit: Payer: PPO | Admitting: Anesthesiology

## 2022-01-22 ENCOUNTER — Ambulatory Visit
Admission: RE | Admit: 2022-01-22 | Discharge: 2022-01-22 | Disposition: A | Discharge status: Home / Self Care | Payer: PPO | Attending: Gastroenterology | Admitting: Gastroenterology

## 2022-01-22 DIAGNOSIS — K5190 Ulcerative colitis, unspecified, without complications: Secondary | ICD-10-CM | POA: Diagnosis not present

## 2022-01-22 DIAGNOSIS — K449 Diaphragmatic hernia without obstruction or gangrene: Secondary | ICD-10-CM | POA: Insufficient documentation

## 2022-01-22 DIAGNOSIS — I10 Essential (primary) hypertension: Secondary | ICD-10-CM | POA: Insufficient documentation

## 2022-01-22 DIAGNOSIS — Z8711 Personal history of peptic ulcer disease: Secondary | ICD-10-CM | POA: Insufficient documentation

## 2022-01-22 DIAGNOSIS — D509 Iron deficiency anemia, unspecified: Secondary | ICD-10-CM | POA: Insufficient documentation

## 2022-01-22 DIAGNOSIS — F419 Anxiety disorder, unspecified: Secondary | ICD-10-CM | POA: Insufficient documentation

## 2022-01-22 DIAGNOSIS — K635 Polyp of colon: Secondary | ICD-10-CM | POA: Diagnosis not present

## 2022-01-22 DIAGNOSIS — K319 Disease of stomach and duodenum, unspecified: Secondary | ICD-10-CM

## 2022-01-22 DIAGNOSIS — Z9889 Other specified postprocedural states: Secondary | ICD-10-CM | POA: Insufficient documentation

## 2022-01-22 DIAGNOSIS — Z980 Intestinal bypass and anastomosis status: Secondary | ICD-10-CM | POA: Insufficient documentation

## 2022-01-22 DIAGNOSIS — K644 Residual hemorrhoidal skin tags: Secondary | ICD-10-CM | POA: Diagnosis not present

## 2022-01-22 DIAGNOSIS — E0590 Thyrotoxicosis, unspecified without thyrotoxic crisis or storm: Secondary | ICD-10-CM | POA: Insufficient documentation

## 2022-01-22 DIAGNOSIS — K529 Noninfective gastroenteritis and colitis, unspecified: Secondary | ICD-10-CM | POA: Insufficient documentation

## 2022-01-22 DIAGNOSIS — K219 Gastro-esophageal reflux disease without esophagitis: Secondary | ICD-10-CM | POA: Insufficient documentation

## 2022-01-22 DIAGNOSIS — Z9049 Acquired absence of other specified parts of digestive tract: Secondary | ICD-10-CM | POA: Insufficient documentation

## 2022-01-22 DIAGNOSIS — K3189 Other diseases of stomach and duodenum: Secondary | ICD-10-CM | POA: Diagnosis not present

## 2022-01-22 DIAGNOSIS — Z85828 Personal history of other malignant neoplasm of skin: Secondary | ICD-10-CM | POA: Insufficient documentation

## 2022-01-22 DIAGNOSIS — K2970 Gastritis, unspecified, without bleeding: Secondary | ICD-10-CM | POA: Diagnosis not present

## 2022-01-22 HISTORY — PX: COLONOSCOPY WITH PROPOFOL: SHX5780

## 2022-01-22 HISTORY — PX: POLYPECTOMY: SHX5525

## 2022-01-22 HISTORY — PX: ESOPHAGOGASTRODUODENOSCOPY (EGD) WITH PROPOFOL: SHX5813

## 2022-01-22 SURGERY — COLONOSCOPY WITH PROPOFOL
Anesthesia: General | Site: Rectum

## 2022-01-22 MED ORDER — PROPOFOL 10 MG/ML IV BOLUS
INTRAVENOUS | Status: DC | PRN
  Administered 2022-01-22: 60 mg via INTRAVENOUS
  Administered 2022-01-22: 20 mg via INTRAVENOUS
  Administered 2022-01-22: 30 mg via INTRAVENOUS
  Administered 2022-01-22: 20 mg via INTRAVENOUS
  Administered 2022-01-22: 50 mg via INTRAVENOUS
  Administered 2022-01-22 (×2): 30 mg via INTRAVENOUS
  Administered 2022-01-22: 40 mg via INTRAVENOUS
  Administered 2022-01-22: 20 mg via INTRAVENOUS
  Administered 2022-01-22: 30 mg via INTRAVENOUS
  Administered 2022-01-22: 20 mg via INTRAVENOUS

## 2022-01-22 MED ORDER — SODIUM CHLORIDE 0.9 % IV SOLN: INTRAVENOUS | Status: DC

## 2022-01-22 MED ORDER — LACTATED RINGERS IV SOLN: INTRAVENOUS | Status: DC

## 2022-01-22 MED ORDER — LIDOCAINE HCL (CARDIAC) PF 100 MG/5ML IV SOSY
PREFILLED_SYRINGE | INTRAVENOUS | Status: DC | PRN
  Administered 2022-01-22: 40 mg via INTRAVENOUS

## 2022-01-22 MED ORDER — STERILE WATER FOR IRRIGATION IR SOLN
Status: DC | PRN
  Administered 2022-01-22: 150 mL

## 2022-01-22 SURGICAL SUPPLY — 36 items
BALLN DILATOR 12-15 8 (BALLOONS)
BALLN DILATOR 15-18 8 (BALLOONS)
BALLN DILATOR CRE 0-12 8 (BALLOONS)
BALLN DILATOR ESOPH 8 10 CRE (MISCELLANEOUS) IMPLANT
BALLOON DILATOR 12-15 8 (BALLOONS) IMPLANT
BALLOON DILATOR 15-18 8 (BALLOONS) IMPLANT
BALLOON DILATOR CRE 0-12 8 (BALLOONS) IMPLANT
BLOCK BITE 60FR ADLT L/F GRN (MISCELLANEOUS) ×3 IMPLANT
CLIP HMST 235XBRD CATH ROT (MISCELLANEOUS) IMPLANT
CLIP RESOLUTION 360 11X235 (MISCELLANEOUS)
ELECT REM PT RETURN 9FT ADLT (ELECTROSURGICAL)
ELECTRODE REM PT RTRN 9FT ADLT (ELECTROSURGICAL) IMPLANT
FCP ESCP3.2XJMB 240X2.8X (MISCELLANEOUS)
FORCEPS BIOP RAD 4 LRG CAP 4 (CUTTING FORCEPS) IMPLANT
FORCEPS BIOP RJ4 240 W/NDL (MISCELLANEOUS)
FORCEPS ESCP3.2XJMB 240X2.8X (MISCELLANEOUS) IMPLANT
GOWN CVR UNV OPN BCK APRN NK (MISCELLANEOUS) ×6 IMPLANT
GOWN ISOL THUMB LOOP REG UNIV (MISCELLANEOUS) ×6
INJECTOR VARIJECT VIN23 (MISCELLANEOUS) IMPLANT
KIT DEFENDO VALVE AND CONN (KITS) IMPLANT
KIT PRC NS LF DISP ENDO (KITS) ×3 IMPLANT
KIT PROCEDURE OLYMPUS (KITS) ×3
MANIFOLD NEPTUNE II (INSTRUMENTS) ×3 IMPLANT
MARKER SPOT ENDO TATTOO 5ML (MISCELLANEOUS) IMPLANT
PROBE APC STR FIRE (PROBE) IMPLANT
RETRIEVER NET PLAT FOOD (MISCELLANEOUS) IMPLANT
RETRIEVER NET ROTH 2.5X230 LF (MISCELLANEOUS) IMPLANT
SNARE COLD EXACTO (MISCELLANEOUS) IMPLANT
SNARE SHORT THROW 13M SML OVAL (MISCELLANEOUS) IMPLANT
SNARE SHORT THROW 30M LRG OVAL (MISCELLANEOUS) IMPLANT
SNARE SNG USE RND 15MM (INSTRUMENTS) IMPLANT
SYR INFLATION 60ML (SYRINGE) IMPLANT
TRAP ETRAP POLY (MISCELLANEOUS) IMPLANT
VARIJECT INJECTOR VIN23 (MISCELLANEOUS)
WATER STERILE IRR 250ML POUR (IV SOLUTION) ×3 IMPLANT
WIRE CRE 18-20MM 8CM F G (MISCELLANEOUS) IMPLANT

## 2022-01-22 NOTE — Anesthesia Preprocedure Evaluation (Signed)
Anesthesia Evaluation  Patient identified by MRN, date of birth, ID band Patient awake    Reviewed: Allergy & Precautions, NPO status , Patient's Chart, lab work & pertinent test results  History of Anesthesia Complications (+) PONV, Family history of anesthesia reaction and history of anesthetic complications  Airway Mallampati: III  TM Distance: <3 FB Neck ROM: full    Dental  (+) Chipped, Poor Dentition, Missing   Pulmonary neg pulmonary ROS, neg shortness of breath,    Pulmonary exam normal        Cardiovascular hypertension, (-) anginaNormal cardiovascular exam     Neuro/Psych  Headaches, PSYCHIATRIC DISORDERS  Neuromuscular disease    GI/Hepatic Neg liver ROS, hiatal hernia, PUD, GERD  Controlled,  Endo/Other  Hyperthyroidism   Renal/GU negative Renal ROS  negative genitourinary   Musculoskeletal   Abdominal   Peds  Hematology negative hematology ROS (+)   Anesthesia Other Findings Past Medical History: No date: Actinic keratosis No date: Anemia     Comment:  LAST HGB 15.2 ON 04-22-16 No date: Anxiety No date: Arthritis 2016: Cancer [facility])     Comment:  skin 2013: Cataract (lens) fragments in eye following cataract surgery,  bilateral     Comment:  one eye done then 4 weeks later the other eye done. 10/29/2015: Chronic ulcerative colitis, without complications ([facility]) No date: Diverticulitis 10/04/2014: Esophagitis, reflux No date: Family history of adverse reaction to anesthesia     Comment:  SISTER HARD TO WAKE UP and nausea and vomiting. 08/09/2013: Female stress incontinence 08/06/2016: Gastroesophageal reflux disease without esophagitis No date: GERD (gastroesophageal reflux disease) No date: Headache     Comment:  MIGRAINE No date: History of hiatal hernia 10/29/2013: Incomplete emptying of bladder 07/26/2013: Migraine No date: PONV (postoperative nausea and vomiting)     Comment:  nausea, does not  remember vomiting. No date: Postherpetic neuralgia     Comment:  RIGHT 7TH.CRANIAL NERVE from shingles followed by Bell's              palsy 11/09/2016: Postmenopausal No date: PVC (premature ventricular contraction) No date: Scoliosis 07/26/2013: Scoliosis (and kyphoscoliosis), idiopathic 01/18/2015: Scoliosis of lumbosacral spine 03/13/2013: Squamous cell carcinoma of skin     Comment:  L clavicle  03/10/2018: Squamous cell carcinoma of skin     Comment:  L med knee 02/21/2019: Squamous cell carcinoma of skin     Comment:  R cheek  06/01/2019: Squamous cell carcinoma of skin     Comment:  L mid lat pretibial - ED&C 11/29/2014: Trochanteric bursitis No date: Ulcerative colitis ([place]) 08/09/2013: Urethral prolapse 08/09/2013: Urge incontinence  Past Surgical History: No date: ABDOMINAL HYSTERECTOMY 03/12/2021: BACK SURGERY     Comment:  L3-L4 AND L3-S1 DECOMPRESSION No date: CARPAL TUNNEL RELEASE No date: CATARACT EXTRACTION W/ INTRAOCULAR LENS  IMPLANT, BILATERAL 08/21/2016: CHOLECYSTECTOMY; N/A     Comment:  Procedure: LAPAROSCOPIC CHOLECYSTECTOMY WITH               INTRAOPERATIVE CHOLANGIOGRAM;  Surgeon: Leonie Green, MD;  Location: [facility] ORS;  Service: General;                Laterality: N/A; No date: COLON SURGERY 11/06/2005: COLONOSCOPY 07/29/2016: COLONOSCOPY WITH PROPOFOL; N/A     Comment:  Procedure: COLONOSCOPY WITH PROPOFOL;  Surgeon: Manya Silvas, MD;  Location: [facility]  ENDOSCOPY;  Service:               Endoscopy;  Laterality: N/A; 08/22/2020: COLONOSCOPY WITH PROPOFOL; N/A     Comment:  Procedure: COLONOSCOPY WITH PROPOFOL;  Surgeon:               Virgel Manifold, MD;  Location: [facility] ENDOSCOPY;                Service: Endoscopy;  Laterality: N/A; 11/06/2005: ESOPHAGOGASTRODUODENOSCOPY 07/29/2016: ESOPHAGOGASTRODUODENOSCOPY (EGD) WITH PROPOFOL; N/A     Comment:  Procedure: ESOPHAGOGASTRODUODENOSCOPY (EGD) WITH                PROPOFOL;  Surgeon: Manya Silvas, MD;  Location: [facility]              ENDOSCOPY;  Service: Endoscopy;  Laterality: N/A; 02/07/2020: ESOPHAGOGASTRODUODENOSCOPY (EGD) WITH PROPOFOL; N/A     Comment:  Procedure: ESOPHAGOGASTRODUODENOSCOPY (EGD) WITH               PROPOFOL;  Surgeon: Virgel Manifold, MD;  Location:               [facility] ENDOSCOPY;  Service: Endoscopy;  Laterality: N/A; 11/24/2016: LAPAROSCOPIC LOW ANTERIOR RESECTION     Comment:  Procedure: LAPAROSCOPIC LOW ANTERIOR RESECTION;                Surgeon: Jules Husbands, MD;  Location: [facility] ORS;                Service: General;; 02/27/2020: XI ROBOTIC ASSISTED PARAESOPHAGEAL HERNIA REPAIR; N/A     Comment:  Procedure: XI ROBOTIC ASSISTED PARAESOPHAGEAL HERNIA               REPAIR;  Surgeon: Jules Husbands, MD;  Location: [facility]               ORS;  Service: General;  Laterality: N/A;  BMI    Body Mass Index: 26.63 kg/m      Reproductive/Obstetrics negative OB ROS                             Anesthesia Physical Anesthesia Plan  ASA: 3  Anesthesia Plan: General   Post-op Pain Management:    Induction: Intravenous  PONV Risk Score and Plan: Propofol infusion and TIVA  Airway Management Planned: Natural Airway and Nasal Cannula  Additional Equipment:   Intra-op Plan:   Post-operative Plan:   Informed Consent: I have reviewed the patients History and Physical, chart, labs and discussed the procedure including the risks, benefits and alternatives for the proposed anesthesia with the patient or authorized representative who has indicated his/her understanding and acceptance.     Dental Advisory Given  Plan Discussed with: Anesthesiologist, CRNA and Surgeon  Anesthesia Plan Comments: (Patient consented for risks of anesthesia including but not limited to:  - adverse reactions to medications - risk of airway placement if required - damage to eyes, teeth, lips or other oral mucosa - nerve  damage due to positioning  - sore throat or hoarseness - Damage to heart, brain, nerves, lungs, other parts of body or loss of life  Patient voiced understanding.)        Anesthesia Quick Evaluation

## 2022-01-22 NOTE — Op Note (Signed)
[facility] Patient Name: April Larsen Procedure Date: 01/22/2022 9:54 AM MRN: [id number] Account #: [id number] Date of Birth: 05-30-1945 Admit Type: Outpatient Age: 76 Room: [facility] OR ROOM 01 Gender: Female Note Status: Finalized Instrument Name: [id number] Procedure:             Upper GI endoscopy Indications:           Unexplained iron deficiency anemia, Diarrhea Providers:             Lin Landsman MD, MD Referring MD:          Rusty Aus, MD (Referring MD) Medicines:             General Anesthesia Complications:         No immediate complications. Estimated blood loss: None. Procedure:             Pre-Anesthesia Assessment:                        - Prior to the procedure, a History and Physical was                         performed, and patient medications and allergies were                         reviewed. The patient is competent. The risks and                         benefits of the procedure and the sedation options and                         risks were discussed with the patient. All questions                         were answered and informed consent was obtained.                         Patient identification and proposed procedure were                         verified by the physician, the nurse, the                         anesthesiologist, the anesthetist and the technician                         in the pre-procedure area in the procedure room in the                         endoscopy suite. Mental Status Examination: alert and                         oriented. Airway Examination: normal oropharyngeal                         airway and neck mobility. Respiratory Examination:                         clear to auscultation. CV Examination: normal.  Prophylactic Antibiotics: The patient does not require                         prophylactic antibiotics. Prior Anticoagulants: The                          patient has taken no anticoagulant or antiplatelet                         agents. ASA Grade Assessment: III - A patient with                         severe systemic disease. After reviewing the risks and                         benefits, the patient was deemed in satisfactory                         condition to undergo the procedure. The anesthesia                         plan was to use general anesthesia. Immediately prior                         to administration of medications, the patient was                         re-assessed for adequacy to receive sedatives. The                         heart rate, respiratory rate, oxygen saturations,                         blood pressure, adequacy of pulmonary ventilation, and                         response to care were monitored throughout the                         procedure. The physical status of the patient was                         re-assessed after the procedure.                        After obtaining informed consent, the endoscope was                         passed under direct vision. Throughout the procedure,                         the patient's blood pressure, pulse, and oxygen                         saturations were monitored continuously. The Endoscope                         was introduced through the mouth, and advanced to the  second part of duodenum. The upper GI endoscopy was                         accomplished without difficulty. The patient tolerated                         the procedure well. Findings:      The duodenal bulb and second portion of the duodenum were normal.       Biopsies were taken with a cold forceps for histology.      Diffuse mildly erythematous mucosa without bleeding was found in the       gastric antrum. Biopsies were taken with a cold forceps for histology.      A 3 cm hiatal hernia was present.      Evidence of a Nissen fundoplication was found in the gastric fundus.  The       wrap appeared loose. This was traversed.      The gastric body and incisura were normal. Biopsies were taken with a       cold forceps for histology.      The gastroesophageal junction and examined esophagus were normal. Impression:            - Normal duodenal bulb and second portion of the                         duodenum. Biopsied.                        - Erythematous mucosa in the antrum. Biopsied.                        - 3 cm hiatal hernia.                        - A Nissen fundoplication was found. The wrap appears                         loose.                        - Normal gastric body and incisura. Biopsied.                        - Normal gastroesophageal junction and esophagus. Recommendation:        - Await pathology results.                        - Follow an antireflux regimen indefinitely.                        - Continue present medications.                        - Proceed with colonoscopy as scheduled                        See colonoscopy report Procedure Code(s):     --- Professional ---                        [phone], Esophagogastroduodenoscopy, flexible,  transoral; with biopsy, single or multiple Diagnosis Code(s):     --- Professional ---                        K31.89, Other diseases of stomach and duodenum                        K44.9, Diaphragmatic hernia without obstruction or                         gangrene                        Z98.890, Other specified postprocedural states                        D50.9, Iron deficiency anemia, unspecified                        R19.7, Diarrhea, unspecified CPT copyright 2022 American [employer] Association. All rights reserved. The codes documented in this report are preliminary and upon coder review may  be revised to meet current compliance requirements. Dr. Ulyess Mort Lin Landsman MD, MD 01/22/2022 10:20:01 AM This report has been signed electronically. Number of Addenda:  0 Note Initiated On: 01/22/2022 9:54 AM Total Procedure Duration: 0 hours 9 minutes 20 seconds  Estimated Blood Loss:  Estimated blood loss: none.      [facility]

## 2022-01-22 NOTE — H&P (Signed)
Cephas Darby, MD [address]  [place]  [place], [place] [address]  Main: [phone]  Fax: [phone] Pager: [phone]  Primary Care Physician:  Rusty Aus, MD Primary Gastroenterologist:  Dr. Cephas Darby  Pre-Procedure History & Physical: HPI:  April Larsen is a 76 y.o. female is here for an endoscopy and colonoscopy.   Past Medical History:  Diagnosis Date   Actinic keratosis    Anemia    LAST HGB 15.2 ON 04-22-16   Anxiety    Arthritis    Cancer ([place]) 2016   skin   Cataract (lens) fragments in eye following cataract surgery, bilateral 2013   one eye done then 4 weeks later the other eye done.   Chronic ulcerative colitis, without complications ([place]) 08/31/6801   Diverticulitis    Esophagitis, reflux 10/04/2014   Family history of adverse reaction to anesthesia    SISTER HARD TO WAKE UP and nausea and vomiting.   Female stress incontinence 08/09/2013   Gastroesophageal reflux disease without esophagitis 08/06/2016   GERD (gastroesophageal reflux disease)    Headache    MIGRAINE   History of hiatal hernia    Incomplete emptying of bladder 10/29/2013   Migraine 07/26/2013   PONV (postoperative nausea and vomiting)    nausea, does not remember vomiting.   Postherpetic neuralgia    RIGHT 7TH.CRANIAL NERVE from shingles followed by Bell's palsy   Postmenopausal 11/09/2016   PVC (premature ventricular contraction)    Scoliosis    Scoliosis (and kyphoscoliosis), idiopathic 07/26/2013   Scoliosis of lumbosacral spine 01/18/2015   Squamous cell carcinoma of skin 03/13/2013   L clavicle    Squamous cell carcinoma of skin 03/10/2018   L med knee   Squamous cell carcinoma of skin 02/21/2019   R cheek    Squamous cell carcinoma of skin 06/01/2019   L mid lat pretibial - ED&C   Trochanteric bursitis 11/29/2014   Ulcerative colitis ([place])    Urethral prolapse 08/09/2013   Urge incontinence 08/09/2013    Past Surgical History:  Procedure Laterality Date    ABDOMINAL HYSTERECTOMY     BACK SURGERY  03/12/2021   L3-L4 AND L3-S1 DECOMPRESSION   CARPAL TUNNEL RELEASE     CATARACT EXTRACTION W/ INTRAOCULAR LENS  IMPLANT, BILATERAL     CHOLECYSTECTOMY N/A 08/21/2016   Procedure: LAPAROSCOPIC CHOLECYSTECTOMY WITH INTRAOPERATIVE CHOLANGIOGRAM;  Surgeon: Leonie Green, MD;  Location: [facility] ORS;  Service: General;  Laterality: N/A;   COLON SURGERY     COLONOSCOPY  11/06/2005   COLONOSCOPY WITH PROPOFOL N/A 07/29/2016   Procedure: COLONOSCOPY WITH PROPOFOL;  Surgeon: Manya Silvas, MD;  Location: [facility] ENDOSCOPY;  Service: Endoscopy;  Laterality: N/A;   COLONOSCOPY WITH PROPOFOL N/A 08/22/2020   Procedure: COLONOSCOPY WITH PROPOFOL;  Surgeon: Virgel Manifold, MD;  Location: [facility] ENDOSCOPY;  Service: Endoscopy;  Laterality: N/A;   ESOPHAGOGASTRODUODENOSCOPY  11/06/2005   ESOPHAGOGASTRODUODENOSCOPY (EGD) WITH PROPOFOL N/A 07/29/2016   Procedure: ESOPHAGOGASTRODUODENOSCOPY (EGD) WITH PROPOFOL;  Surgeon: Manya Silvas, MD;  Location: [facility] ENDOSCOPY;  Service: Endoscopy;  Laterality: N/A;   ESOPHAGOGASTRODUODENOSCOPY (EGD) WITH PROPOFOL N/A 02/07/2020   Procedure: ESOPHAGOGASTRODUODENOSCOPY (EGD) WITH PROPOFOL;  Surgeon: Virgel Manifold, MD;  Location: [facility] ENDOSCOPY;  Service: Endoscopy;  Laterality: N/A;   LAPAROSCOPIC LOW ANTERIOR RESECTION  11/24/2016   Procedure: LAPAROSCOPIC LOW ANTERIOR RESECTION;  Surgeon: Jules Husbands, MD;  Location: [facility] ORS;  Service: General;;   XI ROBOTIC ASSISTED PARAESOPHAGEAL HERNIA REPAIR N/A 02/27/2020  Procedure: XI ROBOTIC ASSISTED PARAESOPHAGEAL HERNIA REPAIR;  Surgeon: Jules Husbands, MD;  Location: [facility] ORS;  Service: General;  Laterality: N/A;    Prior to Admission medications   Medication Sig Start Date End Date Taking? Authorizing Provider  acetaminophen (TYLENOL) 500 MG tablet Take 1,000 mg by mouth every 6 (six) hours as needed for moderate pain or headache.    Yes [provider]   ascorbic Acid (VITAMIN C) 500 MG CPCR 500 mg once daily   Yes [provider]  atenolol (TENORMIN) 25 MG tablet Take 0.5 tablets (12.5 mg total) by mouth daily. 07/03/21  Yes Danford, Suann Larry, MD  Calcium Carbonate-Vitamin D (CALCIUM 600+D PO) Take 1 tablet by mouth 2 (two) times daily.   Yes [provider]  Cholecalciferol (VITAMIN D3) 50 MCG (2000 UT) TABS Take 2,000 Units by mouth daily.   Yes [provider]  DULoxetine (CYMBALTA) 60 MG capsule Take 60 mg by mouth at bedtime.  06/17/15  Yes [provider]  furosemide (LASIX) 20 MG tablet Take 20 mg by mouth daily as needed. 08/19/21  Yes [provider]  magnesium oxide (MAG-OX) 400 MG tablet Take by mouth. 04/01/21  Yes [provider]  meclizine (ANTIVERT) 25 MG tablet Take 1 tablet (25 mg total) by mouth 3 (three) times daily as needed for dizziness or nausea. 06/23/21  Yes Sreenath, Sudheer B, MD  mesalamine (APRISO) 0.375 g 24 hr capsule Take 4 capsules (1.5 g total) by mouth daily. 10/17/20  Yes Virgel Manifold, MD  methimazole (TAPAZOLE) 5 MG tablet Take 1 tablet (5 mg total) by mouth 2 (two) times daily. Patient taking differently: Take 2.5 mg by mouth daily. 07/02/21  Yes Danford, Suann Larry, MD  pantoprazole (PROTONIX) 40 MG tablet Take by mouth. 08/11/21 08/11/22 Yes [provider]  predniSONE (DELTASONE) 10 MG tablet Take 4 tablets (40 mg total) by mouth daily with breakfast for 5 days, THEN 3 tablets (30 mg total) daily with breakfast for 5 days, THEN 2 tablets (20 mg total) daily with breakfast for 5 days, THEN 1 tablet (10 mg total) daily with breakfast for 5 days. 01/12/22 02/01/22 Yes Harce Volden, Tally Due, MD  colestipol (COLESTID) 1 g tablet Take 1 g by mouth 2 (two) times daily. Patient not taking: Reported on 01/07/2022 12/15/21   [provider]  triamterene-hydrochlorothiazide (DYAZIDE) 37.5-25 MG capsule  08/13/21   [provider]     Allergies as of 01/07/2022 - Review Complete 01/07/2022  Allergen Reaction Noted   Hydromorphone Other (See Comments) 08/11/2021   Nsaids Other (See Comments) 01/05/2020   Sulfa antibiotics Rash 08/24/2015    Family History  Problem Relation Age of Onset   Breast cancer Sister 55   Breast cancer Paternal Aunt 43   Breast cancer Cousin        1st paternal cousin   Bladder Cancer Neg Hx    Prostate cancer Neg Hx    Kidney cancer Neg Hx     Social History   Socioeconomic History   Marital status: Divorced    Spouse name: Not on file   Number of children: Not on file   Years of education: Not on file   Highest education level: Not on file  Occupational History   Not on file  Tobacco Use   Smoking status: Never   Smokeless tobacco: Never  Vaping Use   Vaping Use: Never used  Substance and Sexual Activity   Alcohol use: No  Drug use: No   Sexual activity: Not on file  Other Topics Concern   Not on file  Social History Narrative   Not on file   Social Determinants of Health   Financial Resource Strain: Not on file  Food Insecurity: Not on file  Transportation Needs: Not on file  Physical Activity: Not on file  Stress: Not on file  Social Connections: Not on file  Intimate Partner Violence: Not on file    Review of Systems: See HPI, otherwise negative ROS  Physical Exam: BP 139/84   Pulse 64   Temp (!) 97.3 F (36.3 C)   Ht 5' 5"  (1.651 m)   Wt 72.6 kg   SpO2 100%   BMI 26.63 kg/m  General:   Alert,  pleasant and cooperative in NAD Head:  Normocephalic and atraumatic. Neck:  Supple; no masses or thyromegaly. Lungs:  Clear throughout to auscultation.    Heart:  Regular rate and rhythm. Abdomen:  Soft, nontender and nondistended. Normal bowel sounds, without guarding, and without rebound.   Neurologic:  Alert and  oriented x4;  grossly normal neurologically.  Impression/Plan: April Larsen is here for an endoscopy and colonoscopy to be  performed for ulcerative colitis, iron deficiency anemia  Risks, benefits, limitations, and alternatives regarding  endoscopy and colonoscopy have been reviewed with the patient.  Questions have been answered.  All parties agreeable.   Sherri Sear, MD  01/22/2022, 9:52 AM

## 2022-01-22 NOTE — Anesthesia Postprocedure Evaluation (Signed)
Anesthesia Post Note  Patient: April Larsen  Procedure(s) Performed: COLONOSCOPY WITH PROPOFOL (Rectum) ESOPHAGOGASTRODUODENOSCOPY (EGD) WITH PROPOFOL (Mouth) POLYPECTOMY (Rectum)  Patient location during evaluation: PACU Anesthesia Type: General Level of consciousness: awake and alert Pain management: pain level controlled Vital Signs Assessment: post-procedure vital signs reviewed and stable Respiratory status: spontaneous breathing, nonlabored ventilation, respiratory function stable and patient connected to nasal cannula oxygen Cardiovascular status: blood pressure returned to baseline and stable Postop Assessment: no apparent nausea or vomiting Anesthetic complications: no   No notable events documented.   Last Vitals:  Vitals:   01/22/22 0917 01/22/22 1043  BP: 139/84 110/65  Pulse: 64   Resp:  18  Temp: (!) 36.3 C (!) 36.4 C  SpO2: 100% 100%    Last Pain:  Vitals:   01/22/22 1043  PainSc: 0-No pain                 Precious Haws Retia Cordle

## 2022-01-22 NOTE — Op Note (Signed)
[facility] Gastroenterology Patient Name: April Larsen Procedure Date: 01/22/2022 9:54 AM MRN: [id number] Account #: [id number] Date of Birth: [birth year] Admit Type: Outpatient Age: 76 Room: [facility] OR ROOM 01 Gender: Female Note Status: Finalized Instrument Name: [id number] Procedure:             Colonoscopy Indications:           Chronic diarrhea, Clinically significant diarrhea of                         unexplained origin, Follow-up of ulcerative colitis Providers:             Lin Landsman MD, MD Referring MD:          Rusty Aus, MD (Referring MD) Medicines:             General Anesthesia Complications:         No immediate complications. Estimated blood loss: None. Procedure:             Pre-Anesthesia Assessment:                        - Prior to the procedure, a History and Physical was                         performed, and patient medications and allergies were                         reviewed. The patient is competent. The risks and                         benefits of the procedure and the sedation options and                         risks were discussed with the patient. All questions                         were answered and informed consent was obtained.                         Patient identification and proposed procedure were                         verified by the physician, the nurse, the                         anesthesiologist, the anesthetist and the technician                         in the pre-procedure area in the procedure room in the                         endoscopy suite. Mental Status Examination: alert and                         oriented. Airway Examination: normal oropharyngeal                         airway and neck mobility. Respiratory Examination:  clear to auscultation. CV Examination: normal.                         Prophylactic Antibiotics: The patient does not require                          prophylactic antibiotics. Prior Anticoagulants: The                         patient has taken no anticoagulant or antiplatelet                         agents. ASA Grade Assessment: III - A patient with                         severe systemic disease. After reviewing the risks and                         benefits, the patient was deemed in satisfactory                         condition to undergo the procedure. The anesthesia                         plan was to use general anesthesia. Immediately prior                         to administration of medications, the patient was                         re-assessed for adequacy to receive sedatives. The                         heart rate, respiratory rate, oxygen saturations,                         blood pressure, adequacy of pulmonary ventilation, and                         response to care were monitored throughout the                         procedure. The physical status of the patient was                         re-assessed after the procedure.                        After obtaining informed consent, the colonoscope was                         passed under direct vision. Throughout the procedure,                         the patient's blood pressure, pulse, and oxygen                         saturations were monitored continuously. The  Colonoscope was introduced through the anus and                         advanced to the 10 cm into the ileum. The colonoscopy                         was performed without difficulty. The patient                         tolerated the procedure well. The quality of the bowel                         preparation was evaluated using the BBPS [facility] Bowel                         Preparation Scale) with scores of: Right Colon = 3,                         Transverse Colon = 3 and Left Colon = 3 (entire mucosa                         seen well with no residual staining, small fragments                          of stool or opaque liquid). The total BBPS score                         equals 9. The terminal ileum, ileocecal valve,                         appendiceal orifice, and rectum were photographed. Findings:      The perianal and digital rectal examinations were normal. Pertinent       negatives include normal sphincter tone and no palpable rectal lesions.      The terminal ileum appeared normal.      A 6 mm polyp was found in the descending colon. The polyp was sessile.       The polyp was removed with a cold snare. Resection and retrieval were       complete. Estimated blood loss: none.      Normal mucosa was found in the left colon and in the right colon.       Biopsies were taken with a cold forceps for histology. Estimated blood       loss: none.      There was evidence of a prior end-to-side colo-rectal anastomosis in the       proximal rectum. This was patent and was characterized by healthy       appearing mucosa. The anastomosis was traversed.      Multiple diverticula were found in the sigmoid colon.      Non-bleeding external hemorrhoids were found during retroflexion. The       hemorrhoids were large. Impression:            - The examined portion of the ileum was normal.                        - One 6 mm polyp in the descending colon, removed with  a cold snare. Resected and retrieved.                        - Normal mucosa in the left colon and in the right                         colon. Biopsied.                        - Patent end-to-side colo-rectal anastomosis,                         characterized by healthy appearing mucosa.                        - Diverticulosis in the sigmoid colon.                        - Non-bleeding external hemorrhoids. Recommendation:        - Discharge patient to home (with escort).                        - Resume previous diet today.                        - Continue present medications.                         - Await pathology results.                        - Repeat colonoscopy in 5 to 7 years for surveillance                         based on pathology results.                        - Return to my office as previously scheduled. Procedure Code(s):     --- Professional ---                        [phone], Colonoscopy, flexible; with removal of                         tumor(s), polyp(s), or other lesion(s) by snare                         technique                        35361, 76, Colonoscopy, flexible; with biopsy, single                         or multiple Diagnosis Code(s):     --- Professional ---                        K64.4, Residual hemorrhoidal skin tags                        D12.4, Benign neoplasm of descending colon  Z98.0, Intestinal bypass and anastomosis status                        K52.9, Noninfective gastroenteritis and colitis,                         unspecified                        R19.7, Diarrhea, unspecified                        K51.90, Ulcerative colitis, unspecified, without                         complications                        K57.30, Diverticulosis of large intestine without                         perforation or abscess without bleeding CPT copyright 2022 American Medical Association. All rights reserved. The codes documented in this report are preliminary and upon coder review may  be revised to meet current compliance requirements. Dr. Ulyess Mort Lin Landsman MD, MD 01/22/2022 10:43:23 AM This report has been signed electronically. Number of Addenda: 0 Note Initiated On: 01/22/2022 9:54 AM Scope Withdrawal Time: 0 hours 15 minutes 41 seconds  Total Procedure Duration: 0 hours 18 minutes 33 seconds  Estimated Blood Loss:  Estimated blood loss: none.      [facility]

## 2022-01-22 NOTE — Transfer of Care (Signed)
Immediate Anesthesia Transfer of Care Note  Patient: April Larsen  Procedure(s) Performed: COLONOSCOPY WITH PROPOFOL (Rectum) ESOPHAGOGASTRODUODENOSCOPY (EGD) WITH PROPOFOL (Mouth) POLYPECTOMY (Rectum)  Patient Location: PACU  Anesthesia Type: General  Level of Consciousness: awake, alert  and patient cooperative  Airway and Oxygen Therapy: Patient Spontanous Breathing and Patient connected to supplemental oxygen  Post-op Assessment: Post-op Vital signs reviewed, Patient's Cardiovascular Status Stable, Respiratory Function Stable, Patent Airway and No signs of Nausea or vomiting  Post-op Vital Signs: Reviewed and stable  Complications: No notable events documented.

## 2022-01-23 ENCOUNTER — Encounter: Payer: Self-pay | Admitting: Gastroenterology

## 2022-01-26 LAB — SURGICAL PATHOLOGY

## 2022-01-28 ENCOUNTER — Telehealth: Payer: Self-pay

## 2022-01-28 DIAGNOSIS — K5100 Ulcerative (chronic) pancolitis without complications: Secondary | ICD-10-CM

## 2022-01-28 DIAGNOSIS — K5190 Ulcerative colitis, unspecified, without complications: Secondary | ICD-10-CM

## 2022-01-28 NOTE — Telephone Encounter (Signed)
Called and left a message for call back  

## 2022-01-28 NOTE — Telephone Encounter (Signed)
Patient states the diarrhea has improved little. She states she is watching what she eats and is trying not to eat sweets but sometimes it does not matter what she eats or drink. She states she is having diarrhea at least 3 times a day sometimes more. She states some time the stool is loose but thicker then other times. She is scared to go out to eat anywhere because she never knows when it is going to hit her. She will be done with the Prednisone next week. She is still taking the Apriso. She is wanting to come talk to you to discuss her symptoms and see if there is anything else she can do. Informed her you are booked out to March and she has a follow up appointment on 04/28/2022. Informed her I could not over book her with out you telling me to.

## 2022-01-28 NOTE — Telephone Encounter (Signed)
-----   Message from Lin Landsman, MD sent at 01/28/2022 11:25 AM EDT ----- Please inform patient that the pathology results from upper endoscopy came back normal.  The colonoscopy revealed chronic mild active colitis.  Please check with her if she finished prednisone course and if her diarrhea has improved.  Thanks [employer]

## 2022-01-29 MED ORDER — BUDESONIDE ER 9 MG PO TB24: 9.0000 mg | ORAL_TABLET | Freq: Every day | ORAL | 1 refills | Status: DC

## 2022-01-29 NOTE — Telephone Encounter (Signed)
Continue Apriso while on Uceris  RV

## 2022-01-29 NOTE — Telephone Encounter (Signed)
Sent prescription for Uceris which is a form of steroid that acts locally in her colon. If she cannot afford the medication, she should let us know Please have her try this medication if she can get it and then I can see her in a month  RV

## 2022-01-29 NOTE — Telephone Encounter (Signed)
Let's try Entocort to 3 mg 3 pills daily and see if it helps  RV

## 2022-01-29 NOTE — Telephone Encounter (Signed)
Patient left a voicemail and states the medication is 759 dollars and she can not afford that medication. She states she could put it on a credit card if she really has to have the medication but can not do that long term

## 2022-01-29 NOTE — Telephone Encounter (Signed)
Called patient and she verbalized understanding of instructions. She is wanting to know does she stop the Apriso since she will be taking this medication

## 2022-01-29 NOTE — Telephone Encounter (Signed)
Patient verbalized understanding of instructions  

## 2022-01-30 MED ORDER — BUDESONIDE 3 MG PO CPEP: 9.0000 mg | ORAL_CAPSULE | Freq: Every day | ORAL | 0 refills | Status: DC

## 2022-01-30 NOTE — Telephone Encounter (Signed)
Patient verbalized understanding of instructions. Sent medication to the pharmacy

## 2022-01-30 NOTE — Addendum Note (Signed)
Addended by: Ulyess Blossom L on: 01/30/2022 08:44 AM   Modules accepted: Orders

## 2022-02-04 DIAGNOSIS — E0590 Thyrotoxicosis, unspecified without thyrotoxic crisis or storm: Secondary | ICD-10-CM | POA: Diagnosis not present

## 2022-02-06 DIAGNOSIS — M23300 Other meniscus derangements, unspecified lateral meniscus, right knee: Secondary | ICD-10-CM | POA: Diagnosis not present

## 2022-02-06 DIAGNOSIS — M1711 Unilateral primary osteoarthritis, right knee: Secondary | ICD-10-CM | POA: Diagnosis not present

## 2022-02-13 DIAGNOSIS — D2261 Melanocytic nevi of right upper limb, including shoulder: Secondary | ICD-10-CM | POA: Diagnosis not present

## 2022-02-13 DIAGNOSIS — D225 Melanocytic nevi of trunk: Secondary | ICD-10-CM | POA: Diagnosis not present

## 2022-02-13 DIAGNOSIS — L570 Actinic keratosis: Secondary | ICD-10-CM | POA: Diagnosis not present

## 2022-02-13 DIAGNOSIS — L821 Other seborrheic keratosis: Secondary | ICD-10-CM | POA: Diagnosis not present

## 2022-02-13 DIAGNOSIS — D2262 Melanocytic nevi of left upper limb, including shoulder: Secondary | ICD-10-CM | POA: Diagnosis not present

## 2022-02-13 DIAGNOSIS — D2272 Melanocytic nevi of left lower limb, including hip: Secondary | ICD-10-CM | POA: Diagnosis not present

## 2022-02-13 DIAGNOSIS — L814 Other melanin hyperpigmentation: Secondary | ICD-10-CM | POA: Diagnosis not present

## 2022-02-13 DIAGNOSIS — D2271 Melanocytic nevi of right lower limb, including hip: Secondary | ICD-10-CM | POA: Diagnosis not present

## 2022-02-13 DIAGNOSIS — L650 Telogen effluvium: Secondary | ICD-10-CM | POA: Diagnosis not present

## 2022-02-24 DIAGNOSIS — M1711 Unilateral primary osteoarthritis, right knee: Secondary | ICD-10-CM | POA: Diagnosis not present

## 2022-02-27 ENCOUNTER — Telehealth: Payer: Self-pay

## 2022-02-27 ENCOUNTER — Other Ambulatory Visit: Payer: Self-pay | Admitting: Gastroenterology

## 2022-02-27 MED ORDER — BUDESONIDE 3 MG PO CPEP: 9.0000 mg | ORAL_CAPSULE | Freq: Every day | ORAL | 1 refills | Status: DC

## 2022-02-27 NOTE — Telephone Encounter (Signed)
Patient called because she is out of refills for the Budesonide and is needing it refilled. Last office visit 01/07/2022  Has appointment 04/28/22. Made appointment for patient

## 2022-03-19 DIAGNOSIS — E0590 Thyrotoxicosis, unspecified without thyrotoxic crisis or storm: Secondary | ICD-10-CM | POA: Diagnosis not present

## 2022-03-24 DIAGNOSIS — E0590 Thyrotoxicosis, unspecified without thyrotoxic crisis or storm: Secondary | ICD-10-CM | POA: Diagnosis not present

## 2022-04-16 DIAGNOSIS — L821 Other seborrheic keratosis: Secondary | ICD-10-CM | POA: Diagnosis not present

## 2022-04-16 DIAGNOSIS — D0471 Carcinoma in situ of skin of right lower limb, including hip: Secondary | ICD-10-CM | POA: Diagnosis not present

## 2022-04-16 DIAGNOSIS — D485 Neoplasm of uncertain behavior of skin: Secondary | ICD-10-CM | POA: Diagnosis not present

## 2022-04-23 DIAGNOSIS — M419 Scoliosis, unspecified: Secondary | ICD-10-CM | POA: Diagnosis not present

## 2022-04-28 ENCOUNTER — Other Ambulatory Visit: Payer: Self-pay

## 2022-04-28 ENCOUNTER — Encounter: Payer: Self-pay | Admitting: Gastroenterology

## 2022-04-28 ENCOUNTER — Ambulatory Visit (INDEPENDENT_AMBULATORY_CARE_PROVIDER_SITE_OTHER): Payer: PPO | Admitting: Gastroenterology

## 2022-04-28 VITALS — BP 151/84 | HR 71 | Temp 98.1°F | Ht 65.0 in | Wt 178.2 lb

## 2022-04-28 DIAGNOSIS — D509 Iron deficiency anemia, unspecified: Secondary | ICD-10-CM

## 2022-04-28 DIAGNOSIS — K5100 Ulcerative (chronic) pancolitis without complications: Secondary | ICD-10-CM

## 2022-04-28 DIAGNOSIS — K5190 Ulcerative colitis, unspecified, without complications: Secondary | ICD-10-CM

## 2022-04-28 NOTE — Patient Instructions (Signed)
We are applying for [facility]

## 2022-04-28 NOTE — Progress Notes (Signed)
Cephas Darby, MD [address]  [place]  [place], [place] [address]  Main: [phone]  Fax: [phone]    Gastroenterology Consultation  Referring Provider:     Rusty Aus, MD Primary Care Physician:  Rusty Aus, MD Primary Gastroenterologist:  Dr. Cephas Darby Reason for Consultation: Ulcerative colitis        HPI:   April Larsen is a 77 y.o. female referred by Dr. Sabra Heck, Christean Grief, MD  for consultation & management of ulcerative colitis.  Patient has longstanding history of ulcerative colitis for more than 10 years, previously managed by [facility] gastroenterology.  She was on Asacol, later switched to Apriso.  Patient was previously seen by Dr. Bonna Gains until 2022.  Her last colonoscopy from 08/22/2020 revealed chronic colitis with mild activity in the cecum and ascending colon.  Chronic colitis without activity and rest of the colon.  TI was normal.  Patient stayed on Apriso 0.375 g 4 pills daily.  Patient states that her ulcerative colitis has been in remission until she had lumbar fusion surgery in December 2022 for severe lumbar scoliosis.  Since surgery, she had 2 hospitalizations secondary to fall.  Since end of April 2023, patient reports that she has noticed flareup of her colitis symptoms including diarrhea, abdominal cramps, episodes of incontinence.  She denies any rectal bleeding.  She also developed hyperthyroidism and started on methimazole.  She has iron deficiency without anemia.  Patient is also on long term colestipol and sucralfate and not sure if these are helping.  Patient underwent hiatal hernia repair as well as sigmoid colectomy in 2018.  Patient is accompanied by her daughter today.  She reports that she has been relatively doing well with regards to her GI symptoms.  Patient limits diary intake, drinks coffee in the morning, denies consumption of carbonated beverages.  She does not have good appetite.  GI profile PCR on 12/28/2021 was  negative for infection, CRP normal.  Fecal calprotectin was not performed although patient said she dropped off the stool specimen  Follow-up visit 04/28/2022 Patient is here for follow-up of ulcerative colitis.  She underwent colonoscopy on 01/22/2022, which revealed chronic mild active colitis.  She had a flareup of ulcerative colitis after back surgery at that time which prompted colonoscopy.  Also, her fecal calprotectin levels were elevated at 145.  She received short course of prednisone followed by tried to start her on Uceris.  It was expensive.  I started her on Entocort 3 mg 3 pills daily which she has been taking since November.  Patient gained about 15 to 20 pounds.  She reports having diarrheal episodes and Entocort is not helping much.  She is taking Imodium and Pepto-Bismol as needed for diarrhea.  She has postprandial urgency, sometimes nocturnal diarrhea, denies any rectal bleeding.  She denies any abdominal pain. Patient is scheduled to undergo knee replacement on 05/13/2022 by Dr. Marry Guan.  NSAIDs: None  Antiplts/Anticoagulants/Anti thrombotics: None  GI Procedures:  EGD and colonoscopy 01/22/2022 DIAGNOSIS: A. DUODENUM; COLD BIOPSY: - ENTERIC MUCOSA WITH PRESERVED VILLOUS ARCHITECTURE AND NO SIGNIFICANT HISTOPATHOLOGIC CHANGE. - NEGATIVE FOR FEATURES OF CELIAC, DYSPLASIA, AND MALIGNANCY.  B. STOMACH, RANDOM BIOPSY: - GASTRIC ANTRAL MUCOSA WITH FEATURES OF MILD REACTIVE GASTROPATHY. - GASTRIC OXYNTIC MUCOSA WITH CHANGES OF PPI EFFECT. - NEGATIVE FOR H. PYLORI, DYSPLASIA, AND MALIGNANCY.  C. COLON, RIGHT; COLD BIOPSY: - CHRONIC COLITIS WITH MILD ACTIVITY (CRYPTITIS). - NEGATIVE FOR GRANULOMA, DYSPLASIA, AND MALIGNANCY.  D.  COLON POLYP, DESCENDING; COLD SNARE: - POLYPOID FRAGMENTS OF BENIGN COLONIC MUCOSA WITH SUPERFICIAL REACTIVE CHANGES AND SMALL LYMPHOID AGGREGATE. - NEGATIVE FOR DYSPLASIA AND MALIGNANCY.  E. COLON, LEFT; COLD BIOPSY: - CHRONIC COLITIS WITH MILD  ACTIVITY (FOCAL CRYPTITIS). - NEGATIVE FOR GRANULOMA, DYSPLASIA, AND MALIGNANCY.    Sigmoid colectomy 11/24/2016 DIAGNOSIS:  A.  SIGMOID COLON; LAPAROSCOPIC LOW ANTERIOR RESECTION:  - DIVERTICULITIS.  - SEROSAL ADHESIONS.  - REACTIVE LYMPH NODE.  - UNREMARKABLE RESECTION MARGINS.   Upper endoscopy 02/07/2020 - Normal esophagus. - 5 cm hiatal hernia. - Erythematous mucosa in the antrum. Biopsied. - A single gastric polyp. Biopsied. - Normal duodenal bulb, second portion of the duodenum and examined duodenum. - Biopsies were obtained in the gastric body, at the incisura and in the gastric antrum. DIAGNOSIS:  A. STOMACH ERYTHEMA; COLD BIOPSY:  - REACTIVE GASTROPATHY.  - NEGATIVE FOR ACTIVE INFLAMMATION AND H PYLORI.  - NEGATIVE FOR INTESTINAL METAPLASIA, DYSPLASIA, AND MALIGNANCY.   B. STOMACH POLYP; COLD BIOPSY:  - HYPERPLASTIC POLYP.  - NEGATIVE FOR DYSPLASIA AND MALIGNANCY.  Colonoscopy 08/22/2020 - Erythematous mucosa in the ascending colon and in the cecum. Biopsied. - Diverticulosis in the sigmoid colon. - Patent surgical anastomosis, characterized by erythema. - The examination was otherwise normal. - The rectum, sigmoid colon, descending colon, transverse colon and terminal ileum are normal. Biopsied. - Non-bleeding internal hemorrhoids. DIAGNOSIS:  A. TERMINAL ILEUM; COLD BIOPSY:  - ENTERIC MUCOSA WITH NORMAL VILLOUS ARCHITECTURE AND NO SIGNIFICANT  HISTOPATHOLOGIC CHANGE.  - SINGLE FRAGMENT OF COLONIC MUCOSA WITH NO SIGNIFICANT HISTOPATHOLOGIC  CHANGE.  - NEGATIVE FOR ACTIVE ILEITIS/COLITIS.  - NEGATIVE FOR GRANULOMA, DYSPLASIA, AND MALIGNANCY.   B. COLON, CECUM AND ASCENDING; COLD BIOPSY:  - CHRONIC COLITIS WITH MILD ACTIVITY (FOCAL CRYPTITIS AND SUPERFICIAL  ACTIVE MUCOSAL INFLAMMATION).  - NEGATIVE FOR GRANULOMA, DYSPLASIA, AND MALIGNANCY.   C. COLON, TRANSVERSE AND DESCENDING; COLD BIOPSY:  - CHRONIC COLITIS WITHOUT ACTIVITY.  - NEGATIVE FOR GRANULOMA,  DYSPLASIA, AND MALIGNANCY.   D. COLON, RECTOSIGMOID; COLD BIOPSY:  - CHRONIC PROCTITIS WITHOUT ACTIVITY.  - NEGATIVE FOR GRANULOMA, DYSPLASIA, AND MALIGNANCY.    Past Medical History:  Diagnosis Date   Actinic keratosis    Anemia    LAST HGB 15.2 ON 04-22-16   Anxiety    Arthritis    Cancer ([place]) 2016   skin   Cataract (lens) fragments in eye following cataract surgery, bilateral 2013   one eye done then 4 weeks later the other eye done.   Chronic ulcerative colitis, without complications ([place]) 26/37/8588   Diverticulitis    Diverticulitis 2018   Esophagitis, reflux 10/04/2014   Family history of adverse reaction to anesthesia    SISTER HARD TO WAKE UP and nausea and vomiting.   Female stress incontinence 08/09/2013   Gastroesophageal reflux disease without esophagitis 08/06/2016   GERD (gastroesophageal reflux disease)    Headache    MIGRAINE   History of hiatal hernia    Hyperthyroidism    Incomplete emptying of bladder 10/29/2013   Migraine 07/26/2013   PONV (postoperative nausea and vomiting)    nausea, does not remember vomiting.   Postherpetic neuralgia    RIGHT 7TH.CRANIAL NERVE from shingles followed by Bell's palsy   Postmenopausal 11/09/2016   PVC (premature ventricular contraction)    Scoliosis    Scoliosis (and kyphoscoliosis), idiopathic 07/26/2013   Scoliosis of lumbosacral spine 01/18/2015   Squamous cell carcinoma of skin 03/13/2013   L clavicle    Squamous cell carcinoma of skin 03/10/2018  L med knee   Squamous cell carcinoma of skin 02/21/2019   R cheek    Squamous cell carcinoma of skin 06/01/2019   L mid lat pretibial - ED&C   Trochanteric bursitis 11/29/2014   Ulcerative colitis ([place])    Urethral prolapse 08/09/2013   Urge incontinence 08/09/2013    Past Surgical History:  Procedure Laterality Date   ABDOMINAL HYSTERECTOMY     BACK SURGERY  03/12/2021   L3-L4 AND L3-S1 DECOMPRESSION   CARPAL TUNNEL RELEASE     CATARACT EXTRACTION W/  INTRAOCULAR LENS  IMPLANT, BILATERAL     CHOLECYSTECTOMY N/A 08/21/2016   Procedure: LAPAROSCOPIC CHOLECYSTECTOMY WITH INTRAOPERATIVE CHOLANGIOGRAM;  Surgeon: Leonie Green, MD;  Location: [facility];  Service: General;  Laterality: N/A;   COLONOSCOPY  11/06/2005   COLONOSCOPY  2010   COLONOSCOPY  2013   COLONOSCOPY WITH PROPOFOL N/A 07/29/2016   Procedure: COLONOSCOPY WITH PROPOFOL;  Surgeon: Manya Silvas, MD;  Location: [facility] ENDOSCOPY;  Service: Endoscopy;  Laterality: N/A;   COLONOSCOPY WITH PROPOFOL N/A 08/22/2020   Procedure: COLONOSCOPY WITH PROPOFOL;  Surgeon: Virgel Manifold, MD;  Location: [facility] ENDOSCOPY;  Service: Endoscopy;  Laterality: N/A;   COLONOSCOPY WITH PROPOFOL N/A 01/22/2022   Procedure: COLONOSCOPY WITH PROPOFOL;  Surgeon: Lin Landsman, MD;  Location: [place];  Service: Endoscopy;  Laterality: N/A;   ESOPHAGOGASTRODUODENOSCOPY  11/06/2005   ESOPHAGOGASTRODUODENOSCOPY (EGD) WITH PROPOFOL N/A 07/29/2016   Procedure: ESOPHAGOGASTRODUODENOSCOPY (EGD) WITH PROPOFOL;  Surgeon: Manya Silvas, MD;  Location[facility] ENDOSCOPY;  Service: Endoscopy;  Laterality: N/A;   ESOPHAGOGASTRODUODENOSCOPY (EGD) WITH PROPOFOL N/A 02/07/2020   Procedure: ESOPHAGOGASTRODUODENOSCOPY (EGD) WITH PROPOFOL;  Surgeon: Virgel Manifold, MD;  Location: [facility] ENDOSCOPY;  Service: Endoscopy;  Laterality: N/A;   ESOPHAGOGASTRODUODENOSCOPY (EGD) WITH PROPOFOL N/A 01/22/2022   Procedure: ESOPHAGOGASTRODUODENOSCOPY (EGD) WITH PROPOFOL;  Surgeon: Lin Landsman, MD;  Location: [place];  Service: Endoscopy;  Laterality: N/A;   LAPAROSCOPIC LOW ANTERIOR RESECTION  11/24/2016   Procedure: LAPAROSCOPIC LOW ANTERIOR RESECTION;  Surgeon: Jules Husbands, MD;  Location: [facility];  Service: General;;   LUMBAR LAMINECTOMY  2004   L3-4   PARTIAL COLECTOMY  2018   POLYPECTOMY  01/22/2022   Procedure: POLYPECTOMY;  Surgeon: Lin Landsman, MD;  Location: [place];  Service: Endoscopy;;   POSTERIOR LAMINECTOMY / DECOMPRESSION LUMBAR SPINE  2009   L3-S1   XI ROBOTIC ASSISTED PARAESOPHAGEAL HERNIA REPAIR N/A 02/27/2020   Procedure: XI ROBOTIC ASSISTED PARAESOPHAGEAL HERNIA REPAIR;  Surgeon: Jules Husbands, MD;  Location: [facility] OR[facility];  Service: General;  Laterality: N/A;     Current Outpatient Medications:    acetaminophen (TYLENOL) 500 MG tablet, Take 1,000 mg by mouth every 6 (six) hours as needed for moderate pain or headache. , Disp: , Rfl:    ascorbic Acid (VITAMIN C) 500 MG CPCR, 500 mg once daily, Disp: , Rfl:    atenolol (TENORMIN) 25 MG tablet, Take 0.5 tablets (12.5 mg total) by mouth daily., Disp: , Rfl:    budesonide (ENTOCORT EC) 3 MG 24 hr capsule, Take 3 capsules (9 mg total) by mouth daily., Disp: 90 capsule, Rfl: 1   Calcium Carbonate-Vitamin D (CALCIUM 600+D PO), Take 1 tablet by mouth 2 (two) times daily., Disp: , Rfl:    Cholecalciferol (VITAMIN D3) 50 MCG (2000 UT) TABS, Take 2,000 Units by mouth daily., Disp: , Rfl:    DULoxetine (CYMBALTA) 60 MG capsule, Take 60 mg by mouth at bedtime. ,  Disp: , Rfl: 3   furosemide (LASIX) 20 MG tablet, Take 20 mg by mouth daily as needed., Disp: , Rfl:    magnesium oxide (MAG-OX) 400 MG tablet, Take by mouth., Disp: , Rfl:    meclizine (ANTIVERT) 25 MG tablet, Take 1 tablet (25 mg total) by mouth 3 (three) times daily as needed for dizziness or nausea., Disp: 30 tablet, Rfl: 0   mesalamine (APRISO) 0.375 g 24 hr capsule, Take 4 capsules (1.5 g total) by mouth daily., Disp: 360 capsule, Rfl: 0   methimazole (TAPAZOLE) 5 MG tablet, Take 1 tablet (5 mg total) by mouth 2 (two) times daily. (Patient taking differently: Take 2.5 mg by mouth daily.), Disp: , Rfl:    pantoprazole (PROTONIX) 40 MG tablet, Take by mouth., Disp: , Rfl:    Family History  Problem Relation Age of Onset   Breast cancer Sister 64   Breast cancer Paternal Aunt 35   Breast cancer Cousin        1st paternal cousin    Bladder Cancer Neg Hx    Prostate cancer Neg Hx    Kidney cancer Neg Hx      Social History   Tobacco Use   Smoking status: Never   Smokeless tobacco: Never  Vaping Use   Vaping Use: Never used  Substance Use Topics   Alcohol use: No   Drug use: No    Allergies as of 04/28/2022 - Review Complete 04/28/2022  Allergen Reaction Noted   Hydromorphone Other (See Comments) 08/11/2021   Nsaids Other (See Comments) 01/05/2020   Sulfa antibiotics Rash 08/24/2015    Review of Systems:    All systems reviewed and negative except where noted in HPI.   Physical Exam:  BP (!) 151/84 (BP Location: Right Arm, Patient Position: Sitting, Cuff Size: Normal)   Pulse 71   Temp 98.1 F (36.7 C) (Oral)   Ht '5\' 5"'$  (1.651 m)   Wt 178 lb 4 oz (80.9 kg)   BMI 29.66 kg/m  No LMP recorded. Patient has had a hysterectomy.  General:   Alert,  Well-developed, well-nourished, pleasant and cooperative in NAD Head:  Normocephalic and atraumatic. Eyes:  Sclera clear, no icterus.   Conjunctiva pink. Ears:  Normal auditory acuity. Nose:  No deformity, discharge, or lesions. Mouth:  No deformity or lesions,oropharynx pink & moist. Neck:  Supple; no masses or thyromegaly. Lungs:  Respirations even and unlabored.  Clear throughout to auscultation.   No wheezes, crackles, or rhonchi. No acute distress. Heart:  Regular rate and rhythm; no murmurs, clicks, rubs, or gallops. Abdomen:  Normal bowel sounds. Soft, non-tender and non-distended without masses, hepatosplenomegaly or hernias noted.  No guarding or rebound tenderness.   Rectal: Not performed Msk:  Symmetrical without gross deformities. Good, equal movement & strength bilaterally. Pulses:  Normal pulses noted. Extremities:  No clubbing or edema.  No cyanosis. Neurologic:  Alert and oriented x3;  grossly normal neurologically. Skin:  Intact without significant lesions or rashes. No jaundice. Psych:  Alert and cooperative. Normal mood and  affect.  Imaging Studies: No recent abdominal imaging  Assessment and Plan:   April Larsen is a 77 y.o. female with longstanding history of ulcerative colitis, maintained on mesalamine, lumbar scoliosis s/p lumbar fusion in December 2022, large hiatal hernia s/p repair, sigmoid diverticulitis s/p sigmoid colectomy in 2018 is seen in consultation for ulcerative pan colitis, also with history of iron deficiency anemia  Ulcerative colitis: Not in remission since her back surgery  in mid 2023 Elevated fecal calprotectin levels  Colonoscopy revealed chronic mild active pan colitis Currently on Apriso 0.375 g 4 pills daily and budesonide 9 mg daily Advised patient to decrease budesonide to 3 mg daily Recheck labs today Discussed with patient regarding Biologics such as Humira or Rinvoq or Zeposia given that colitis is not under control.  Patient preferred to try oral medication.  Will apply for Zeposia Check CBC, CMP, hepatitis A, B and C serologies, QuantiFERON gold Check GI profile PCR  Iron deficiency anemia: Anemia has resolved Patient is planning to undergo knee replacement in February EGD and colonoscopy did not reveal source of anemia Recommend video capsule endoscopy prior to undergoing knee replacement surgery and patient is agreeable  Follow up in 3 to 4 months   Cephas Darby, MD

## 2022-04-29 ENCOUNTER — Telehealth: Payer: Self-pay

## 2022-04-29 DIAGNOSIS — K529 Noninfective gastroenteritis and colitis, unspecified: Secondary | ICD-10-CM

## 2022-04-29 NOTE — Discharge Instructions (Signed)
Instructions after Total Knee Replacement   April Larsen, Jr., M.D.     Dept. of Orthopaedics & Sports Medicine  [facility]  [address]  [place], [place]  [address]  Phone: [phone]   Fax: [phone]    DIET: Drink plenty of non-alcoholic fluids. Resume your normal diet. Include foods high in fiber.  ACTIVITY:  You may use crutches or a walker with weight-bearing as tolerated, unless instructed otherwise. You may be weaned off of the walker or crutches by your Physical Therapist.  Do NOT place pillows under the knee. Anything placed under the knee could limit your ability to straighten the knee.   Continue doing gentle exercises. Exercising will reduce the pain and swelling, increase motion, and prevent muscle weakness.   Please continue to use the TED compression stockings for 6 weeks. You may remove the stockings at night, but should reapply them in the morning. Do not drive or operate any equipment until instructed.  WOUND CARE:  Continue to use the PolarCare or ice packs periodically to reduce pain and swelling. You may bathe or shower after the staples are removed at the first office visit following surgery.  MEDICATIONS: You may resume your regular medications. Please take the pain medication as prescribed on the medication. Do not take pain medication on an empty stomach. You have been given a prescription for a blood thinner (Lovenox or Coumadin). Please take the medication as instructed. (NOTE: After completing a 2 week course of Lovenox, take one Enteric-coated aspirin once a day. This along with elevation will help reduce the possibility of phlebitis in your operated leg.) Do not drive or drink alcoholic beverages when taking pain medications.  CALL THE OFFICE FOR: Temperature above 101 degrees Excessive bleeding or drainage on the dressing. Excessive swelling, coldness, or paleness of the toes. Persistent nausea and vomiting.  FOLLOW-UP:  You  should have an appointment to return to the office in 10-14 days after surgery. Arrangements have been made for continuation of Physical Therapy (either home therapy or outpatient therapy).   [facility] Department Directory         [email or web address]       [email or web address]          Cardiology  Appointments: [place] - [phone] [place] - [phone]  Endocrinology  Appointments: [place] - [phone] [place] - [phone]  Gastroenterology  Appointments: [place] - [phone] [place] - [phone]        General Surgery   Appointments: [place] - [phone]  Internal Medicine/Family Medicine  Appointments: [place] - [phone] [place] - [phone] [place] - [phone]  Metabolic and Weigh Loss Surgery  Appointments: [place] - [phone]        Neurology  Appointments: [place] - [phone] [place] - [phone]  Neurosurgery  Appointments: [place] - [phone]  Obstetrics & Gynecology  Appointments: [place] - [phone] [place] - [phone]        Pediatrics  Appointments: [place] - [phone] [place] - [phone]  Physiatry  Appointments: [place] -[phone]  Physical Therapy  Appointments: [place] - [phone] [place] - [phone]        Podiatry  Appointments: [place] - [phone] [place] - [phone]  Pulmonology  Appointments: [place] - [phone]  Rheumatology  Appointments: [place] - [phone]        [place] Location: [facility]  [address] , [place]  [address]  [place] Location: [facility] [address] [place], [place]  [address]  [place] Location: [facility] [address] [place], [place]  [address]    

## 2022-04-29 NOTE — Telephone Encounter (Signed)
-----  Message from [facility], MD sent at 04/29/2022 10:14 AM EST ----- Her WBC count is elevated, she could have infection causing worsening of diarrhea, please order GI profile PCR to rule out any infection  RV

## 2022-04-29 NOTE — Telephone Encounter (Signed)
Patient verbalized understanding of results and she will come pick up the stool kit today

## 2022-04-30 ENCOUNTER — Telehealth: Payer: Self-pay

## 2022-04-30 DIAGNOSIS — K529 Noninfective gastroenteritis and colitis, unspecified: Secondary | ICD-10-CM | POA: Diagnosis not present

## 2022-04-30 NOTE — Telephone Encounter (Signed)
Patient insurance company denied the [employer]. Patient insurance company denied medication because she has failed TWO of the following medications: Humira (adalimumab)/Cyltezo/or Yuflyma, Stelara (ustekinumab), Rinvoq (upadacitinib), or Xeljanz IR (tofacitinib IR)/Xeljanz XR (tofacitinib XR). Called the [place] support and they said for the patient to get approved for the patient assistance with a medicare plan. They have to have the denial letter and a denied appeal. Do you want to do a appeal for the medication Can you please write letter if you do or let me know if you want to change the medication

## 2022-04-30 NOTE — Telephone Encounter (Signed)
Submitted PA through Cover my meds for the Zeposia . It did ask Has the patient tried and failed TWO of the following medications: Humira (adalimumab)/Cyltezo/or Yuflyma, Stelara (ustekinumab), Rinvoq (upadacitinib), or Xeljanz IR (tofacitinib IR)/Xeljanz XR (tofacitinib XR)?* Put no because she has not tried these medications. Submitted last office visit note also

## 2022-05-01 ENCOUNTER — Encounter: Payer: Self-pay | Admitting: Urgent Care

## 2022-05-01 ENCOUNTER — Encounter
Admission: RE | Admit: 2022-05-01 | Discharge: 2022-05-01 | Disposition: A | Discharge status: Home / Self Care | Payer: PPO | Source: Ambulatory Visit | Attending: Orthopedic Surgery | Admitting: Orthopedic Surgery

## 2022-05-01 DIAGNOSIS — E876 Hypokalemia: Secondary | ICD-10-CM | POA: Diagnosis not present

## 2022-05-01 DIAGNOSIS — N390 Urinary tract infection, site not specified: Secondary | ICD-10-CM | POA: Insufficient documentation

## 2022-05-01 DIAGNOSIS — Z01812 Encounter for preprocedural laboratory examination: Secondary | ICD-10-CM | POA: Insufficient documentation

## 2022-05-01 DIAGNOSIS — M1711 Unilateral primary osteoarthritis, right knee: Secondary | ICD-10-CM | POA: Insufficient documentation

## 2022-05-01 DIAGNOSIS — B961 Klebsiella pneumoniae [K. pneumoniae] as the cause of diseases classified elsewhere: Secondary | ICD-10-CM | POA: Insufficient documentation

## 2022-05-01 DIAGNOSIS — Z01818 Encounter for other preprocedural examination: Secondary | ICD-10-CM | POA: Diagnosis not present

## 2022-05-01 HISTORY — DX: Thyrotoxicosis, unspecified without thyrotoxic crisis or storm: E05.90

## 2022-05-01 HISTORY — DX: Essential (primary) hypertension: I10

## 2022-05-01 LAB — URINALYSIS, ROUTINE W REFLEX MICROSCOPIC
Bilirubin Urine: NEGATIVE
Glucose, UA: NEGATIVE mg/dL
Hgb urine dipstick: NEGATIVE
Ketones, ur: NEGATIVE mg/dL
Nitrite: NEGATIVE
Protein, ur: NEGATIVE mg/dL
Specific Gravity, Urine: 1.023 (ref 1.005–1.030)
pH: 5 (ref 5.0–8.0)

## 2022-05-01 LAB — COMPREHENSIVE METABOLIC PANEL
ALT: 21 U/L (ref 0–44)
AST: 22 U/L (ref 15–41)
Albumin: 4.2 g/dL (ref 3.5–5.0)
Alkaline Phosphatase: 125 U/L (ref 38–126)
Anion gap: 8 (ref 5–15)
BUN: 31 mg/dL — ABNORMAL HIGH (ref 8–23)
CO2: 26 mmol/L (ref 22–32)
Calcium: 9.6 mg/dL (ref 8.9–10.3)
Chloride: 103 mmol/L (ref 98–111)
Creatinine, Ser: 0.88 mg/dL (ref 0.44–1.00)
GFR, Estimated: >60 mL/min (ref >60)
Glucose, Bld: 101 mg/dL — ABNORMAL HIGH (ref 70–99)
Potassium: 3.9 mmol/L (ref 3.5–5.1)
Sodium: 137 mmol/L (ref 135–145)
Total Bilirubin: 0.8 mg/dL (ref 0.3–1.2)
Total Protein: 7.5 g/dL (ref 6.5–8.1)

## 2022-05-01 LAB — C-REACTIVE PROTEIN: CRP: 0.7 mg/dL (ref <1.0)

## 2022-05-01 LAB — SURGICAL PCR SCREEN
MRSA, PCR: NEGATIVE
Staphylococcus aureus: POSITIVE — AB

## 2022-05-01 LAB — TYPE AND SCREEN
ABO/RH(D): O POS
Antibody Screen: NEGATIVE

## 2022-05-01 LAB — SEDIMENTATION RATE: Sed Rate: 8 mm/hr (ref 0–30)

## 2022-05-01 NOTE — Patient Instructions (Addendum)
Your procedure is scheduled on: Wednesday, February 14 Report to the Registration Desk on the 1st floor of the [facility]. To find out your arrival time, please call [phone] between 1PM - 3PM on: Tuesday, February 13 If your arrival time is 6:00 am, do not arrive prior to that time as the [place] entrance doors do not open until 6:00 am.  REMEMBER: Instructions that are not followed completely may result in serious medical risk, up to and including death; or upon the discretion of your surgeon and anesthesiologist your surgery may need to be rescheduled.  Do not eat food after midnight the night before surgery.  No gum chewing, lozengers or hard candies.  You may however, drink CLEAR liquids up to 2 hours before you are scheduled to arrive for your surgery. Do not drink anything within 2 hours of your scheduled arrival time.  Clear liquids include: - water  - apple juice without pulp - gatorade (not RED colors) - black coffee or tea (Do NOT add milk or creamers to the coffee or tea) Do NOT drink anything that is not on this list.  In addition, your doctor has ordered for you to drink the provided  Ensure Pre-Surgery Clear Carbohydrate Drink  Drinking this carbohydrate drink up to two hours before surgery helps to reduce insulin resistance and improve patient outcomes. Please complete drinking 2 hours prior to scheduled arrival time.  TAKE THESE MEDICATIONS THE MORNING OF SURGERY WITH A SIP OF WATER:  Atenolol Budesonide (Entocort) Methimazole Mesalamine  Pantoprazole (Protonix) - (take one the night before and one on the morning of surgery - helps to prevent nausea after surgery.)  One week prior to surgery: starting February 7 Stop Anti-inflammatories (NSAIDS) such as Advil, Aleve, Ibuprofen, Motrin, Naproxen, Naprosyn and Aspirin based products such as Excedrin, Goodys Powder, BC Powder. Stop ANY OVER THE COUNTER supplements until after surgery. Stop vitamin C You  may however, continue to take Tylenol if needed for pain up until the day of surgery.  No Alcohol for 24 hours before or after surgery.  No Smoking including e-cigarettes for 24 hours prior to surgery.  No chewable tobacco products for at least 6 hours prior to surgery.  No nicotine patches on the day of surgery.  Do not use any "recreational" drugs for at least a week prior to your surgery.  Please be advised that the combination of cocaine and anesthesia may have negative outcomes, up to and including death. If you test positive for cocaine, your surgery will be cancelled.  On the morning of surgery brush your teeth with toothpaste and water, you may rinse your mouth with mouthwash if you wish. Do not swallow any toothpaste or mouthwash.  Use CHG Soap as directed on instruction sheet.  Do not wear jewelry, make-up, hairpins, clips or nail polish.  Do not wear lotions, powders, or perfumes.   Do not shave body from the neck down 48 hours prior to surgery just in case you cut yourself which could leave a site for infection.  Also, freshly shaved skin may become irritated if using the CHG soap.  Contact lenses, hearing aids and dentures may not be worn into surgery.  Do not bring valuables to the hospital. [facility] is not responsible for any missing/lost belongings or valuables.   Notify your doctor if there is any change in your medical condition (cold, fever, infection).  Wear comfortable clothing (specific to your surgery type) to the hospital.  After surgery, you  can help prevent lung complications by doing breathing exercises.  Take deep breaths and cough every 1-2 hours. Your doctor may order a device called an Incentive Spirometer to help you take deep breaths.  If you are being admitted to the hospital overnight, leave your suitcase in the car. After surgery it may be brought to your room.  In case of increased patient census, it may be necessary for you, the patient,  to continue your postoperative care within the Same Day Surgery department.  If you are being discharged the day of surgery, you will not be allowed to drive home. You will need a responsible individual to drive you home and stay with you for 24 hours after surgery.   If you are taking public transportation, you will need to have a responsible adult (18 years or older) with you. Please confirm with your physician that it is acceptable to use public transportation.   Please call the [place] Dept. at [phone] if you have any questions about these instructions.  Surgery Visitation Policy:  Patients undergoing a surgery or procedure may have two family members or support persons with them as long as the person is not COVID-19 positive or experiencing its symptoms.   Inpatient Visitation:    Visiting hours are 7 a.m. to 8 p.m. Up to four visitors are allowed at one time in a patient room. The visitors may rotate out with other people during the day. One designated support person (adult) may remain overnight.  Due to an increase in RSV and influenza rates and associated hospitalizations, children ages 56 and under will not be able to visit patients in [facility]. Masks continue to be strongly recommended.     Preparing for Surgery with CHLORHEXIDINE GLUCONATE (CHG) Soap  Chlorhexidine Gluconate (CHG) Soap  o An antiseptic cleaner that kills germs and bonds with the skin to continue killing germs even after washing  o Used for showering the night before surgery and morning of surgery  Before surgery, you can play an important role by reducing the number of germs on your skin.  CHG (Chlorhexidine gluconate) soap is an antiseptic cleanser which kills germs and bonds with the skin to continue killing germs even after washing.  Please do not use if you have an allergy to CHG or antibacterial soaps. If your skin becomes reddened/irritated stop using the  CHG.  1. Shower the NIGHT BEFORE SURGERY and the MORNING OF SURGERY with CHG soap.  2. If you choose to wash your hair, wash your hair first as usual with your normal shampoo.  3. After shampooing, rinse your hair and body thoroughly to remove the shampoo.  4. Use CHG as you would any other liquid soap. You can apply CHG directly to the skin and wash gently with a scrungie or a clean washcloth.  5. Apply the CHG soap to your body only from the neck down. Do not use on open wounds or open sores. Avoid contact with your eyes, ears, mouth, and genitals (private parts). Wash face and genitals (private parts) with your normal soap.  6. Wash thoroughly, paying special attention to the area where your surgery will be performed.  7. Thoroughly rinse your body with warm water.  8. Do not shower/wash with your normal soap after using and rinsing off the CHG soap.  9. Pat yourself dry with a clean towel.  10. Wear clean pajamas to bed the night before surgery.  12. Place clean sheets on your  bed the night of your first shower and do not sleep with pets.  13. Shower again with the CHG soap on the day of surgery prior to arriving at the hospital.  14. Do not apply any deodorants/lotions/powders.  15. Please wear clean clothes to the hospital.  Preoperative Educational Videos  To better prepare for surgery, please view our videos that explain the physical activity and discharge planning required to have the best surgical recovery at [facility].  [email or web address]  Questions? Call [phone] or email [email or web address]

## 2022-05-01 NOTE — Telephone Encounter (Signed)
Let's apply for rinvoq  Induction: 45 mg once daily for 8 weeks; maintenance: 15 mg once daily   Patient should inform us before she starts taking it  RV

## 2022-05-01 NOTE — Telephone Encounter (Signed)
Sent in the Rinvoq to [facility] and sent a email to rick with [facility] to inform him it was on the way. Called and informed patient of the change

## 2022-05-03 LAB — HEPATIC FUNCTION PANEL
ALT: 22 IU/L (ref 0–32)
AST: 20 IU/L (ref 0–40)
Albumin: 4.3 g/dL (ref 3.8–4.8)
Alkaline Phosphatase: 158 IU/L — ABNORMAL HIGH (ref 44–121)
Bilirubin Total: 0.4 mg/dL (ref 0.0–1.2)
Bilirubin, Direct: 0.13 mg/dL (ref 0.00–0.40)
Total Protein: 7 g/dL (ref 6.0–8.5)

## 2022-05-03 LAB — QUANTIFERON-TB GOLD PLUS
QuantiFERON Mitogen Value: >10 IU/mL
QuantiFERON Nil Value: 0 IU/mL
QuantiFERON TB1 Ag Value: 0 IU/mL
QuantiFERON TB2 Ag Value: 0 IU/mL
QuantiFERON-TB Gold Plus: NEGATIVE

## 2022-05-03 LAB — HEPATITIS B CORE ANTIBODY, TOTAL: Hep B Core Total Ab: NEGATIVE

## 2022-05-03 LAB — CBC
Hematocrit: 38.6 % (ref 34.0–46.6)
Hemoglobin: 13.1 g/dL (ref 11.1–15.9)
MCH: 29 pg (ref 26.6–33.0)
MCHC: 33.9 g/dL (ref 31.5–35.7)
MCV: 85 fL (ref 79–97)
Platelets: 383 10*3/uL (ref 150–450)
RBC: 4.52 x10E6/uL (ref 3.77–5.28)
RDW: 14.3 % (ref 11.7–15.4)
WBC: 14.2 10*3/uL — ABNORMAL HIGH (ref 3.4–10.8)

## 2022-05-03 LAB — HEPATITIS A ANTIBODY, TOTAL: hep A Total Ab: NEGATIVE

## 2022-05-03 LAB — HEPATITIS C ANTIBODY: Hep C Virus Ab: NONREACTIVE

## 2022-05-03 LAB — HEPATITIS B SURFACE ANTIGEN: Hepatitis B Surface Ag: NEGATIVE

## 2022-05-03 LAB — HEPATITIS B SURFACE ANTIBODY,QUALITATIVE: Hep B Surface Ab, Qual: NONREACTIVE

## 2022-05-04 ENCOUNTER — Telehealth: Payer: Self-pay

## 2022-05-04 DIAGNOSIS — K5100 Ulcerative (chronic) pancolitis without complications: Secondary | ICD-10-CM

## 2022-05-04 LAB — GI PROFILE, STOOL, PCR

## 2022-05-04 LAB — URINE CULTURE: Culture: >=100000 — AB

## 2022-05-04 NOTE — Telephone Encounter (Signed)
Let's apply for Humira, standard induction and maintenance dose  RV

## 2022-05-04 NOTE — Telephone Encounter (Signed)
Patient is supposed to have knee replacement on 05/13/2022. She states she went for her pre op on Friday and found that she had a staph infection. She states she thinks it coming from the place that she had biopsy done on a place on her leg a week or so ago with dermatology  and they told her she needed more taken off but she told them to wait till March because she was having knee replacement surgery. She asked if she could try to get it done this week since she has this infection. Informed patient to call orthopedic and get there advise

## 2022-05-04 NOTE — Telephone Encounter (Signed)
Submitted PA through cover my meds for Rinvoq '45mg'$  and Rinvoq '15mg'$ . Waiting on response from insurance company

## 2022-05-04 NOTE — Telephone Encounter (Signed)
-----   Message from Lin Landsman, MD sent at 05/04/2022 10:58 AM EST ----- Stool studies came back negative for infection.  Please put in the labs for CBC, LFTs and fasting lipids in 4 weeks after she starts Rinvoq.  Patient should come in for the lab work in 4 weeks after she starts Rinvoq  RV

## 2022-05-04 NOTE — Telephone Encounter (Signed)
Filled out paperwork and faxed to [facility] speciality. Also filled out the Humira complete form

## 2022-05-04 NOTE — Progress Notes (Signed)
  [facility] Perioperative Services: Pre-Admission/Anesthesia Testing  Abnormal Lab Notification   Date: 05/04/22  Name: April Larsen MRN:   [id number]  Re: Abnormal labs noted during PAT appointment   Notified:  Provider Name Provider Role Notification Mode  Skip Estimable, MD Orthopedics (Surgeon) Routed and/or faxed via [employer]   Abnormal Lab Value(s):   Lab Results  Component Value Date   COLORURINE YELLOW (A) 05/01/2022   APPEARANCEUR CLEAR (A) 05/01/2022   LABSPEC 1.023 05/01/2022   PHURINE 5.0 05/01/2022   GLUCOSEU NEGATIVE 05/01/2022   HGBUR NEGATIVE 05/01/2022   BILIRUBINUR NEGATIVE 05/01/2022   KETONESUR NEGATIVE 05/01/2022   PROTEINUR NEGATIVE 05/01/2022   NITRITE NEGATIVE 05/01/2022   LEUKOCYTESUR TRACE (A) 05/01/2022   EPIU 0-5 05/01/2022   WBCU 6-10 05/01/2022   RBCU 0-5 05/01/2022   BACTERIA MANY (A) 05/01/2022   CULT >=100,000 COLONIES/mL KLEBSIELLA PNEUMONIAE (A) 05/01/2022   Clinical Information and Notes:  Patient is scheduled for COMPUTER ASSISTED TOTAL KNEE ARTHROPLASTY  on 05/13/2022.    UA performed in PAT consistent with/concerning for infection.  (+) leukocytosis noted on CBC; WBC 14.2 Renal function: Estimated Creatinine Clearance: 58.9 mL/min (by C-G formula based on SCr of 0.88 mg/dL). Urine C&S added to assess for pathogenically significant growth.   Impression and Plan:  April Larsen with a UA that was (+) for infection; reflex culture sent. Culture (+) for significant Klebsiella pneumoniae colony count. Sending results for review and consideration of preoperative treatment as deemed appropriate by Dr. Marry Guan.   Honor Loh, MSN, APRN, FNP-C, CEN [facility]  Peri-operative Services Nurse Practitioner Phone: [phone] Fax: [phone] 05/04/22 1:15 PM  NOTE: This note has been prepared using Dragon dictation software. Despite my best ability to proofread, there is always the potential  that unintentional transcriptional errors may still occur from this process.

## 2022-05-04 NOTE — Telephone Encounter (Signed)
Health team advantage denied the the Rinvoq '45mg'$  and '15mg'$ . They said she has to have a trial on Mesalamine, Olsalazine, or Sulfasalazine. I put in the PA she was taking Budesonide and Apriso. They also want her to try Humira first before taking the Rinvoq. Please advise if you want a appeal to be done.  Fax appeal to [phone]  Or call [phone]

## 2022-05-04 NOTE — Telephone Encounter (Signed)
Patient verbalized understanding of results. Informed her I would remind her of the labs when she start the medication. Order the labs.

## 2022-05-05 DIAGNOSIS — M1711 Unilateral primary osteoarthritis, right knee: Secondary | ICD-10-CM | POA: Diagnosis not present

## 2022-05-05 DIAGNOSIS — I700 Atherosclerosis of aorta: Secondary | ICD-10-CM | POA: Diagnosis not present

## 2022-05-06 ENCOUNTER — Telehealth: Payer: Self-pay

## 2022-05-06 ENCOUNTER — Telehealth: Payer: Self-pay | Admitting: Gastroenterology

## 2022-05-06 MED ORDER — HUMIRA-CD/UC/HS STARTER 80 MG/0.8ML ~~LOC~~ AJKT: AUTO-INJECTOR | SUBCUTANEOUS | 0 refills | Status: DC

## 2022-05-06 MED ORDER — HUMIRA (2 PEN) 40 MG/0.4ML ~~LOC~~ AJKT: AUTO-INJECTOR | SUBCUTANEOUS | 12 refills | Status: DC

## 2022-05-06 NOTE — Telephone Encounter (Signed)
[facility] stating the need a valid prescription sent to them for the Humira starter and maintenance .  Sent medication through the computer

## 2022-05-06 NOTE — Telephone Encounter (Signed)
Submitted PA through  cover my meds for the Humira. Waiting on the response from the insurance company

## 2022-05-06 NOTE — Telephone Encounter (Signed)
Patient is stating that the Humira support called her to set up time to talk to her about the Humira. She told them that she needed to wait till after her knee surgery. Informed her I would let her know when it has been approved by insurance

## 2022-05-06 NOTE — Telephone Encounter (Signed)
Go ahead and apply for patient assistance program.  I recommend patient to start taking Humira after her knee surgery only  RV

## 2022-05-06 NOTE — Telephone Encounter (Signed)
Insurance approved medication but she has to meet her deductible before they will cover the medication. Her deductible is 3,333 dollars. She does not qualify for the saving card since she is medicare. Do you want to apply for patient assistance for the Humira. Patient is also wanting to know when you want her to start the Humira because she is having knee surgery on 05/13/2022. If she gets it through insurance she has to go through [employer] [phone] fax [phone]

## 2022-05-06 NOTE — Telephone Encounter (Signed)
Right after her knee surgery or should she wait a month or so before starting therapy. Patient is okay with doing the patient assistance. She will come tomorrow to sign forms

## 2022-05-06 NOTE — Telephone Encounter (Signed)
Pt would like a call back in ref to medication Humuria . Callback -[phone] cell

## 2022-05-07 ENCOUNTER — Ambulatory Visit
Admission: RE | Admit: 2022-05-07 | Discharge: 2022-05-07 | Disposition: A | Discharge status: Home / Self Care | Payer: PPO | Source: Ambulatory Visit | Attending: Gastroenterology | Admitting: Gastroenterology

## 2022-05-07 ENCOUNTER — Encounter
Admission: RE | Disposition: A | Discharge status: Home / Self Care | Payer: Self-pay | Source: Ambulatory Visit | Attending: Gastroenterology

## 2022-05-07 DIAGNOSIS — D509 Iron deficiency anemia, unspecified: Secondary | ICD-10-CM | POA: Diagnosis not present

## 2022-05-07 HISTORY — PX: GIVENS CAPSULE STUDY: SHX5432

## 2022-05-07 SURGERY — IMAGING PROCEDURE, GI TRACT, INTRALUMINAL, VIA CAPSULE

## 2022-05-08 ENCOUNTER — Encounter: Payer: Self-pay | Admitting: Gastroenterology

## 2022-05-09 NOTE — H&P (Signed)
ORTHOPAEDIC HISTORY & PHYSICAL Regino Bellow, PA - 05/05/2022 1:15 PM EST Formatting of this note is different from the original. Images from the original note were not included. Chief Complaint Chief Complaint Patient presents with Knee Pain H & P RIGHT KNEE  Reason for Visit April Larsen is a 77 y.o. who presents today for a history and physical. She is to undergo a right total knee arthroplasty on 05/13/2022. Since her last visit here to clinic she has not seen any improvement in her condition. On today's visit patient was extremely anxious. She has a lot of medical issues going on that is concerning to her. Recently has had a flareup of her colitis and is scheduled for colonoscopy. She has seen GI and has had a workup and does not appear to show any signs of any infection. Recently also had a biopsy of the right lower extremity and was diagnosed as having a skin cancer. She is concerned this may keep her from having surgery. She wishes to proceed with surgery.  She has a several year history of right knee pain. She localizes most of the pain along the lateral and anterior aspect of the knee. She reports some swelling, no locking, and some giving way of the knee. The pain is aggravated by going up and down stairs, standing, and walking. The knee pain limits the patient's ability to ambulate long distances.The patient has not appreciated any significant improvement despite Tylenol, oral steroids, intra-articular corticosteroid injections, viscosupplementation, and activity modification. She is not using any ambulatory aids. The patient states that the knee pain has progressed to the point that it is significantly interfering with her activities of daily living.  Past Medical History Past Medical History: Diagnosis Date Arthritis Colitis, nonspecific, unspecified Diverticulitis 08/06/2016 Gastroesophageal reflux disease without esophagitis 08/06/2016 HH (hiatus hernia)  08/06/2016 Migraine 07/26/2013 Postherpetic neuralgia right 7th cranial nerve Postmenopausal Scoliosis 07/26/2013 Skin cancer left leg removed Aug 2022 Skin cancer of face 2021 Thyroid disease Ulcerative colitis (CMS-HCC) 07/26/2013  Past Surgical History Past Surgical History: Procedure Laterality Date L3-L4 LAMINECTOMY 09/2002 EGD 11/06/2005 COLONOSCOPY 11/06/2005 Diverticulosis L3-S1 LUMBAR DECOMPRESSION 05/2007 Dr. 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  Past Family History Family History Problem Relation Age of Onset Heart disease Father Diabetes Father Myocardial Infarction (Heart attack) Father 5 High blood pressure (Hypertension) Mother Arthritis Mother Breast cancer Sister Diabetes Brother Breast cancer Paternal Aunt Arthritis Maternal Aunt Arthritis Maternal Grandmother  Medications Current Outpatient Medications Ordered in Epic Medication Sig Dispense Refill ACETAMINOPHEN (TYLENOL ORAL) Take 200 mg by mouth as needed 650 mg in am and pm (if needed) acetaminophen (TYLENOL) 650 MG ER tablet Take 650 mg by mouth 2 (two) times daily ascorbic Acid (VITAMIN C) 500 mg CpER SR capsule 500 mg once daily atenoloL (TENORMIN) 25 MG tablet Take 0.5 tablets (12.5 mg total) by mouth once daily 15 tablet 11 budesonide (ENTOCORT EC) 3 mg EC capsule Take 9 mg by mouth 3 (three) times  daily cholecalciferol 1000 unit tablet Take 1,000 Units by mouth once daily colestipoL (COLESTID) 1 gram tablet Take 1 tablet (1  g total) by mouth 2 (two) times daily Take before meals 120 tablet 1 DULoxetine (CYMBALTA) 60 MG DR capsule Take 1 capsule (60 mg total) by mouth once daily 90 capsule 3 FUROsemide (LASIX) 20 MG tablet Take 1 tablet (20 mg total) by mouth once daily as needed for Edema 30 tablet 11 magnesium oxide (MAG-OX) 400 mg (241.3 mg magnesium) tablet Take by mouth once daily meclizine (ANTIVERT) 25 mg tablet Take 25 mg by mouth 3 (three) times daily as needed for Dizziness mesalamine (APRISO) 0.375 gram ER capsule Take 1 capsule (0.375 g total) by mouth as directed . Take 4 capsules once daily for a total dose 1.5 grams daily. 120 capsule 6 methIMAzole (TAPAZOLE) 5 MG tablet Take 1 tablet (5 mg total) by mouth once daily Per patient. 90 tablet 3 neomycin-polymyxin-dexAMETHasone (MAXITROL) ophthalmic suspension Place 2 drops into the left eye 3 (three) times daily in the morning, at noon, and at bedtime. pantoprazole (PROTONIX) 40 MG DR tablet Take 1 tablet (40 mg total) by mouth 2 (two) times daily before meals 60 tablet 11  No current Epic-ordered facility-administered medications on file.  Allergies Allergies Allergen Reactions Dilaudid [Hydromorphone] Hallucination Nsaids (Non-Steroidal Anti-Inflammatory Drug) Abdominal Pain Patient has had part of her colon removed  Sulfa (Sulfonamide Antibiotics) Rash   Review of Systems A comprehensive 14 point ROS was performed, reviewed, and the pertinent orthopaedic findings are documented in the HPI.  Exam BP 122/82 (BP Location: Left upper arm, Patient Position: Sitting, BP Cuff Size: Adult)  Ht 165.1 cm (5' 5"$ )  Wt 81.7 kg (180 lb 3.2 oz)  LMP (LMP Unknown) Comment: Hysterectomy  BMI 29.99 kg/m  General: Well-developed well-nourished female seen in no acute distress.  HEENT: Atraumatic,normocephalic. Pupils are  equal and reactive to light. Oropharynx is clear with moist mucosa  Lungs: Clear to auscultation bilaterally  Cardiovascular: Regular rate and rhythm. Normal S1, S2. No murmurs. No appreciable gallops or rubs. Peripheral pulses are palpable.  Abdomen: Soft, non-tender, nondistended. Bowel sounds present  Extremity: Right Knee: Soft tissue swelling: mild. Does have a scab over the area to the anterior distal one third right lower leg from her biopsy that shows some irritation but no drainage or signs of infection. Effusion: none Erythema: none Crepitance: mild Tenderness: lateral, anterior Alignment: relative valgus Mediolateral laxity: stable Posterior sag: negative Patellar tracking: Good tracking without evidence of subluxation or tilt Atrophy: No significant atrophy. Quadriceps tone was fair to good. Range of motion: 0/0/130 degrees   Neurological:  The patient is alert and oriented Sensation to light touch appears to be intact and within normal limits Gross motor strength appeared to be equal to 5/5  Vascular :  Peripheral pulses felt to be palpable. Capillary refill appears to be intact and within normal limits  X-ray  1. AP standing, lateral and sunrise view of the right knee ordered and interpreted on today's visit shows narrowing of the medial cartilage space narrowing is increased from prior films. Osteophyte formation is noted in a tricompartmental fashion. Sclerosis is noted to the medial compartment.. Degenerative changes to the patellofemoral articulation are noted. No evidence of fracture or dislocation.  MRI OF THE RIGHT KNEE WITHOUT CONTRAST  TECHNIQUE: Multiplanar, multisequence MR imaging of the knee was performed. No intravenous contrast was administered.  COMPARISON: None Available.  FINDINGS: MENISCI  Medial meniscus: Oblique tear of the posterior horn and body, most obvious on the coronal images. There is resulting peripheral extrusion of  the meniscus from the joint space with  mild caudal displacement of a small flap fragment into the meniscotibial recess. The meniscal root appears intact. No centrally displaced meniscal fragments are identified.  Lateral meniscus: Intact with normal morphology.  LIGAMENTS  Cruciates: Intact.  Collaterals: Intact. Small amount of fluid within the pes anserine bursa.  CARTILAGE  Patellofemoral: Diffuse patellofemoral chondral thinning with osteophytes and subchondral cyst formation.  Medial: Moderate medial compartment osteoarthritis with diffuse chondral thinning, osteophytes and subchondral edema in the femoral condyle and tibial plateau.  Lateral: Milder lateral compartment osteoarthritis with full-thickness chondral thinning over the central aspect of the lateral tibial plateau with underlying subchondral cyst formation. Prominent peripheral osteophytes.  MISCELLANEOUS  Joint: Moderate-sized joint effusion with diffuse synovitis. No discrete loose bodies identified.  Popliteal Fossa: The popliteus muscle and tendon are intact. Tiny Baker's cyst.  Extensor Mechanism: Intact.  Bones: No acute osseous findings are evident. There is a septated T2 hyperintense lesion within the fibular head measuring approximately 1.9 x 1.4 x 1.3 cm. This appears nonaggressive, without endosteal thinning, pathologic fracture or adjacent soft tissue mass, and probably reflects an incidental intraosseous ganglion. There is a smaller cystic lesion posteriorly in the proximal tibia measuring 10 mm in diameter. No worrisome osseous lesions.  Other: Mild anterior subcutaneous edema without other focal soft tissue abnormality.  IMPRESSION: 1. Oblique tear of the posterior horn and body of the medial meniscus with resulting peripheral extrusion of the meniscus from the joint space and a small flap fragment displaced into the meniscotibial recess. 2. Tricompartmental osteoarthritis, most  advanced in the medial compartment. 3. Moderate-sized joint effusion with diffuse synovitis. 4. Intact lateral meniscus, cruciate and collateral ligaments.  Electronically Signed By: Richardean Sale M.D. On: 01/21/2022 11:05   Impression  1. Degenerative arthrosis right knee  Plan  1. Medication list was gone over on today's visit 2. Past medical history was reviewed 3. Postop rehab course was discussed 4. Return to clinic 2 weeks postop. Sooner if any problems  This note was generated in part with voice recognition software and I apologize for any typographical errors that were not detected and corrected   Watt Climes PA Electronically signed by Regino Bellow, PA at 05/05/2022 1:40 PM EST

## 2022-05-11 ENCOUNTER — Telehealth: Payer: Self-pay

## 2022-05-11 ENCOUNTER — Other Ambulatory Visit: Payer: Self-pay

## 2022-05-11 DIAGNOSIS — D509 Iron deficiency anemia, unspecified: Secondary | ICD-10-CM

## 2022-05-11 NOTE — Telephone Encounter (Signed)
-----   Message from Lin Landsman, MD sent at 05/10/2022  3:47 PM EST ----- Regarding: Capsule results Please inform patient that the capsule study was incomplete. I was informed that patient mistakenly turned off the recorder. Will repeat after her knee surgery  RV

## 2022-05-11 NOTE — Telephone Encounter (Signed)
Called and patient states that after she left her preop appointment the device started beeping. She called the ENDO unit and went back and they got the device to start flashing again. She states she then just set in the hospital waiting room till 4:00 when she return the device. Schedule for 06/22/2022

## 2022-05-12 MED ORDER — LACTATED RINGERS IV SOLN: INTRAVENOUS | Status: DC

## 2022-05-12 MED ORDER — CHLORHEXIDINE GLUCONATE 4 % EX LIQD
60.0000 mL | Freq: Once | CUTANEOUS | Status: AC
  Administered 2022-05-13: 4 via TOPICAL

## 2022-05-12 MED ORDER — CHLORHEXIDINE GLUCONATE 0.12 % MT SOLN: 15.0000 mL | Freq: Once | OROMUCOSAL | Status: AC

## 2022-05-12 MED ORDER — TRANEXAMIC ACID-NACL 1000-0.7 MG/100ML-% IV SOLN
1000.0000 mg | INTRAVENOUS | Status: AC
  Administered 2022-05-13: 1000 mg via INTRAVENOUS

## 2022-05-12 MED ORDER — DEXAMETHASONE SODIUM PHOSPHATE 10 MG/ML IJ SOLN: 8.0000 mg | Freq: Once | INTRAMUSCULAR | Status: AC

## 2022-05-12 MED ORDER — ORAL CARE MOUTH RINSE: 15.0000 mL | Freq: Once | OROMUCOSAL | Status: AC

## 2022-05-12 MED ORDER — CEFAZOLIN SODIUM-DEXTROSE 2-4 GM/100ML-% IV SOLN
2.0000 g | INTRAVENOUS | Status: AC
  Administered 2022-05-13: 2 g via INTRAVENOUS

## 2022-05-12 MED ORDER — GABAPENTIN 300 MG PO CAPS: 300.0000 mg | ORAL_CAPSULE | Freq: Once | ORAL | Status: AC

## 2022-05-13 ENCOUNTER — Observation Stay: Payer: PPO

## 2022-05-13 ENCOUNTER — Observation Stay
Admission: RE | Admit: 2022-05-13 | Discharge: 2022-05-14 | Disposition: A | Discharge status: Home Health | Payer: PPO | Source: Ambulatory Visit | Attending: Orthopedic Surgery | Admitting: Orthopedic Surgery

## 2022-05-13 ENCOUNTER — Ambulatory Visit: Payer: PPO | Admitting: Urgent Care

## 2022-05-13 ENCOUNTER — Encounter
Admission: RE | Disposition: A | Discharge status: Home Health | Payer: Self-pay | Source: Ambulatory Visit | Attending: Orthopedic Surgery

## 2022-05-13 ENCOUNTER — Other Ambulatory Visit: Payer: Self-pay

## 2022-05-13 ENCOUNTER — Encounter: Payer: Self-pay | Admitting: Orthopedic Surgery

## 2022-05-13 DIAGNOSIS — Z471 Aftercare following joint replacement surgery: Secondary | ICD-10-CM | POA: Diagnosis not present

## 2022-05-13 DIAGNOSIS — K5190 Ulcerative colitis, unspecified, without complications: Secondary | ICD-10-CM

## 2022-05-13 DIAGNOSIS — M1711 Unilateral primary osteoarthritis, right knee: Principal | ICD-10-CM

## 2022-05-13 DIAGNOSIS — F419 Anxiety disorder, unspecified: Secondary | ICD-10-CM | POA: Diagnosis not present

## 2022-05-13 DIAGNOSIS — Z01812 Encounter for preprocedural laboratory examination: Secondary | ICD-10-CM

## 2022-05-13 DIAGNOSIS — I10 Essential (primary) hypertension: Secondary | ICD-10-CM | POA: Diagnosis not present

## 2022-05-13 DIAGNOSIS — Z96659 Presence of unspecified artificial knee joint: Secondary | ICD-10-CM

## 2022-05-13 DIAGNOSIS — R82998 Other abnormal findings in urine: Secondary | ICD-10-CM | POA: Insufficient documentation

## 2022-05-13 DIAGNOSIS — R8290 Unspecified abnormal findings in urine: Secondary | ICD-10-CM

## 2022-05-13 DIAGNOSIS — Z85828 Personal history of other malignant neoplasm of skin: Secondary | ICD-10-CM | POA: Insufficient documentation

## 2022-05-13 DIAGNOSIS — E039 Hypothyroidism, unspecified: Secondary | ICD-10-CM | POA: Diagnosis not present

## 2022-05-13 DIAGNOSIS — E876 Hypokalemia: Secondary | ICD-10-CM

## 2022-05-13 DIAGNOSIS — Z96651 Presence of right artificial knee joint: Secondary | ICD-10-CM | POA: Diagnosis not present

## 2022-05-13 HISTORY — PX: KNEE ARTHROPLASTY: SHX992

## 2022-05-13 SURGERY — ARTHROPLASTY, KNEE, TOTAL, USING IMAGELESS COMPUTER-ASSISTED NAVIGATION
Anesthesia: General | Site: Knee | Laterality: Right

## 2022-05-13 MED ORDER — ENSURE PRE-SURGERY PO LIQD
296.0000 mL | Freq: Once | ORAL | Status: AC
  Administered 2022-05-13: 296 mL via ORAL
  Filled 2022-05-13: qty 296

## 2022-05-13 MED ORDER — OXYCODONE HCL 5 MG PO TABS
10.0000 mg | ORAL_TABLET | ORAL | Status: DC | PRN
  Administered 2022-05-13 – 2022-05-14 (×3): 10 mg via ORAL
  Filled 2022-05-13 (×3): qty 2

## 2022-05-13 MED ORDER — OXYCODONE HCL 5 MG/5ML PO SOLN: 5.0000 mg | Freq: Once | ORAL | Status: DC | PRN

## 2022-05-13 MED ORDER — STERILE WATER FOR IRRIGATION IR SOLN
Status: DC | PRN
  Administered 2022-05-13: 1000 mL

## 2022-05-13 MED ORDER — FENTANYL CITRATE (PF) 100 MCG/2ML IJ SOLN
INTRAMUSCULAR | Status: AC
  Filled 2022-05-13: qty 2

## 2022-05-13 MED ORDER — DIPHENHYDRAMINE HCL 12.5 MG/5ML PO ELIX: 12.5000 mg | ORAL_SOLUTION | ORAL | Status: DC | PRN

## 2022-05-13 MED ORDER — ACETAMINOPHEN 10 MG/ML IV SOLN
INTRAVENOUS | Status: DC | PRN
  Administered 2022-05-13: 1000 mg via INTRAVENOUS

## 2022-05-13 MED ORDER — NEOMYCIN-POLYMYXIN-DEXAMETH 3.5-10000-0.1 OP SUSP
2.0000 [drp] | Freq: Three times a day (TID) | OPHTHALMIC | Status: DC
  Administered 2022-05-13 – 2022-05-14 (×2): 2 [drp] via OPHTHALMIC
  Filled 2022-05-13: qty 0.1

## 2022-05-13 MED ORDER — FLEET ENEMA 7-19 GM/118ML RE ENEM: 1.0000 | ENEMA | Freq: Once | RECTAL | Status: DC | PRN

## 2022-05-13 MED ORDER — PHENOL 1.4 % MT LIQD: 1.0000 | OROMUCOSAL | Status: DC | PRN

## 2022-05-13 MED ORDER — EPHEDRINE SULFATE (PRESSORS) 50 MG/ML IJ SOLN
INTRAMUSCULAR | Status: DC | PRN
  Administered 2022-05-13: 10 mg via INTRAVENOUS

## 2022-05-13 MED ORDER — MIDAZOLAM HCL 2 MG/2ML IJ SOLN
INTRAMUSCULAR | Status: AC
  Filled 2022-05-13: qty 2

## 2022-05-13 MED ORDER — PROPOFOL 1000 MG/100ML IV EMUL
INTRAVENOUS | Status: AC
  Filled 2022-05-13: qty 100

## 2022-05-13 MED ORDER — ALUM & MAG HYDROXIDE-SIMETH 200-200-20 MG/5ML PO SUSP: 30.0000 mL | ORAL | Status: DC | PRN

## 2022-05-13 MED ORDER — KETAMINE HCL 50 MG/5ML IJ SOSY
PREFILLED_SYRINGE | INTRAMUSCULAR | Status: AC
  Filled 2022-05-13: qty 5

## 2022-05-13 MED ORDER — BUPIVACAINE HCL (PF) 0.5 % IJ SOLN
INTRAMUSCULAR | Status: AC
  Filled 2022-05-13: qty 10

## 2022-05-13 MED ORDER — SODIUM CHLORIDE 0.9 % IR SOLN
Status: DC | PRN
  Administered 2022-05-13: 3000 mL

## 2022-05-13 MED ORDER — LACTATED RINGERS IV SOLN: INTRAVENOUS | Status: DC

## 2022-05-13 MED ORDER — METHIMAZOLE 5 MG PO TABS
2.5000 mg | ORAL_TABLET | ORAL | Status: DC
  Administered 2022-05-14: 2.5 mg via ORAL
  Filled 2022-05-13: qty 1

## 2022-05-13 MED ORDER — FUROSEMIDE 20 MG PO TABS: 20.0000 mg | ORAL_TABLET | Freq: Every day | ORAL | Status: DC | PRN

## 2022-05-13 MED ORDER — ENOXAPARIN SODIUM 30 MG/0.3ML IJ SOSY
30.0000 mg | PREFILLED_SYRINGE | Freq: Two times a day (BID) | INTRAMUSCULAR | Status: DC
  Administered 2022-05-14: 30 mg via SUBCUTANEOUS
  Filled 2022-05-13: qty 0.3

## 2022-05-13 MED ORDER — BUPIVACAINE LIPOSOME 1.3 % IJ SUSP
INTRAMUSCULAR | Status: AC
  Filled 2022-05-13: qty 40

## 2022-05-13 MED ORDER — TRANEXAMIC ACID-NACL 1000-0.7 MG/100ML-% IV SOLN
INTRAVENOUS | Status: AC
  Filled 2022-05-13: qty 100

## 2022-05-13 MED ORDER — ATENOLOL 25 MG PO TABS
12.5000 mg | ORAL_TABLET | Freq: Every day | ORAL | Status: DC
  Administered 2022-05-14: 12.5 mg via ORAL
  Filled 2022-05-13: qty 1

## 2022-05-13 MED ORDER — OXYCODONE HCL 5 MG PO TABS: 5.0000 mg | ORAL_TABLET | ORAL | Status: DC | PRN

## 2022-05-13 MED ORDER — SODIUM CHLORIDE (PF) 0.9 % IJ SOLN
INTRAMUSCULAR | Status: DC | PRN
  Administered 2022-05-13: 120 mL via INTRAMUSCULAR

## 2022-05-13 MED ORDER — SENNOSIDES-DOCUSATE SODIUM 8.6-50 MG PO TABS
1.0000 | ORAL_TABLET | Freq: Two times a day (BID) | ORAL | Status: DC
  Administered 2022-05-13: 1 via ORAL
  Filled 2022-05-13 (×2): qty 1

## 2022-05-13 MED ORDER — ONDANSETRON HCL 4 MG/2ML IJ SOLN: 4.0000 mg | Freq: Four times a day (QID) | INTRAMUSCULAR | Status: DC | PRN

## 2022-05-13 MED ORDER — PHENYLEPHRINE HCL (PRESSORS) 10 MG/ML IV SOLN
INTRAVENOUS | Status: DC | PRN
  Administered 2022-05-13 (×2): 120 ug via INTRAVENOUS

## 2022-05-13 MED ORDER — GABAPENTIN 300 MG PO CAPS
ORAL_CAPSULE | ORAL | Status: AC
  Administered 2022-05-13: 300 mg via ORAL
  Filled 2022-05-13: qty 1

## 2022-05-13 MED ORDER — ACETAMINOPHEN 10 MG/ML IV SOLN: 1000.0000 mg | Freq: Once | INTRAVENOUS | Status: DC | PRN

## 2022-05-13 MED ORDER — MAGNESIUM HYDROXIDE 400 MG/5ML PO SUSP
30.0000 mL | Freq: Every day | ORAL | Status: DC
  Administered 2022-05-14: 30 mL via ORAL
  Filled 2022-05-13: qty 30

## 2022-05-13 MED ORDER — MENTHOL 3 MG MT LOZG: 1.0000 | LOZENGE | OROMUCOSAL | Status: DC | PRN

## 2022-05-13 MED ORDER — METHIMAZOLE 5 MG PO TABS: 5.0000 mg | ORAL_TABLET | Freq: Every day | ORAL | Status: DC

## 2022-05-13 MED ORDER — ACETAMINOPHEN 10 MG/ML IV SOLN
1000.0000 mg | Freq: Four times a day (QID) | INTRAVENOUS | Status: AC
  Administered 2022-05-13 – 2022-05-14 (×3): 1000 mg via INTRAVENOUS
  Filled 2022-05-13 (×3): qty 100

## 2022-05-13 MED ORDER — ACETAMINOPHEN 325 MG PO TABS: 325.0000 mg | ORAL_TABLET | Freq: Four times a day (QID) | ORAL | Status: DC | PRN

## 2022-05-13 MED ORDER — PHENYLEPHRINE HCL-NACL 20-0.9 MG/250ML-% IV SOLN
INTRAVENOUS | Status: DC | PRN
  Administered 2022-05-13: 50 ug/min via INTRAVENOUS

## 2022-05-13 MED ORDER — OXYCODONE HCL 5 MG PO TABS: 5.0000 mg | ORAL_TABLET | Freq: Once | ORAL | Status: DC | PRN

## 2022-05-13 MED ORDER — ACETAMINOPHEN 10 MG/ML IV SOLN
INTRAVENOUS | Status: AC
  Filled 2022-05-13: qty 100

## 2022-05-13 MED ORDER — FERROUS SULFATE 325 (65 FE) MG PO TABS
325.0000 mg | ORAL_TABLET | Freq: Two times a day (BID) | ORAL | Status: DC
  Administered 2022-05-13 – 2022-05-14 (×2): 325 mg via ORAL
  Filled 2022-05-13 (×2): qty 1

## 2022-05-13 MED ORDER — BUPIVACAINE HCL (PF) 0.25 % IJ SOLN
INTRAMUSCULAR | Status: AC
  Filled 2022-05-13: qty 120

## 2022-05-13 MED ORDER — METOCLOPRAMIDE HCL 5 MG PO TABS
10.0000 mg | ORAL_TABLET | Freq: Three times a day (TID) | ORAL | Status: DC
  Administered 2022-05-13 – 2022-05-14 (×3): 10 mg via ORAL
  Filled 2022-05-13 (×3): qty 2

## 2022-05-13 MED ORDER — METHIMAZOLE 5 MG PO TABS: 5.0000 mg | ORAL_TABLET | ORAL | Status: DC

## 2022-05-13 MED ORDER — BISACODYL 10 MG RE SUPP: 10.0000 mg | Freq: Every day | RECTAL | Status: DC | PRN

## 2022-05-13 MED ORDER — HYDROMORPHONE HCL 1 MG/ML IJ SOLN: 0.5000 mg | INTRAMUSCULAR | Status: DC | PRN

## 2022-05-13 MED ORDER — TRANEXAMIC ACID-NACL 1000-0.7 MG/100ML-% IV SOLN
1000.0000 mg | Freq: Once | INTRAVENOUS | Status: AC
  Administered 2022-05-13: 1000 mg via INTRAVENOUS

## 2022-05-13 MED ORDER — BUPIVACAINE HCL (PF) 0.5 % IJ SOLN
INTRAMUSCULAR | Status: DC | PRN
  Administered 2022-05-13: 3 mL

## 2022-05-13 MED ORDER — DULOXETINE HCL 30 MG PO CPEP
60.0000 mg | ORAL_CAPSULE | Freq: Every day | ORAL | Status: DC
  Administered 2022-05-13: 60 mg via ORAL
  Filled 2022-05-13: qty 2

## 2022-05-13 MED ORDER — PANTOPRAZOLE SODIUM 40 MG PO TBEC
40.0000 mg | DELAYED_RELEASE_TABLET | Freq: Two times a day (BID) | ORAL | Status: DC
  Administered 2022-05-13 – 2022-05-14 (×2): 40 mg via ORAL
  Filled 2022-05-13 (×2): qty 1

## 2022-05-13 MED ORDER — PHENYLEPHRINE HCL (PRESSORS) 10 MG/ML IV SOLN
INTRAVENOUS | Status: AC
  Filled 2022-05-13: qty 1

## 2022-05-13 MED ORDER — BUDESONIDE 3 MG PO CPEP
6.0000 mg | ORAL_CAPSULE | Freq: Every day | ORAL | Status: DC
  Administered 2022-05-14: 6 mg via ORAL
  Filled 2022-05-13: qty 2

## 2022-05-13 MED ORDER — PROPOFOL 500 MG/50ML IV EMUL
INTRAVENOUS | Status: DC | PRN
  Administered 2022-05-13: 75 ug/kg/min via INTRAVENOUS

## 2022-05-13 MED ORDER — CEFAZOLIN SODIUM-DEXTROSE 2-4 GM/100ML-% IV SOLN
2.0000 g | Freq: Four times a day (QID) | INTRAVENOUS | Status: AC
  Administered 2022-05-13 (×2): 2 g via INTRAVENOUS
  Filled 2022-05-13 (×2): qty 100

## 2022-05-13 MED ORDER — MIDAZOLAM HCL 5 MG/5ML IJ SOLN
INTRAMUSCULAR | Status: DC | PRN
  Administered 2022-05-13 (×2): 1 mg via INTRAVENOUS

## 2022-05-13 MED ORDER — KETAMINE HCL 10 MG/ML IJ SOLN
INTRAMUSCULAR | Status: DC | PRN
  Administered 2022-05-13: 30 mg via INTRAVENOUS

## 2022-05-13 MED ORDER — CEFAZOLIN SODIUM-DEXTROSE 2-4 GM/100ML-% IV SOLN
INTRAVENOUS | Status: AC
  Filled 2022-05-13: qty 100

## 2022-05-13 MED ORDER — MECLIZINE HCL 25 MG PO TABS: 25.0000 mg | ORAL_TABLET | Freq: Three times a day (TID) | ORAL | Status: DC | PRN

## 2022-05-13 MED ORDER — FENTANYL CITRATE (PF) 100 MCG/2ML IJ SOLN: 25.0000 ug | INTRAMUSCULAR | Status: DC | PRN

## 2022-05-13 MED ORDER — ONDANSETRON HCL 4 MG/2ML IJ SOLN: 4.0000 mg | Freq: Once | INTRAMUSCULAR | Status: DC | PRN

## 2022-05-13 MED ORDER — CHLORHEXIDINE GLUCONATE 0.12 % MT SOLN
OROMUCOSAL | Status: AC
  Administered 2022-05-13: 15 mL via OROMUCOSAL
  Filled 2022-05-13: qty 15

## 2022-05-13 MED ORDER — MAGNESIUM OXIDE 400 MG PO TABS
400.0000 mg | ORAL_TABLET | Freq: Every day | ORAL | Status: DC
  Administered 2022-05-14: 400 mg via ORAL
  Filled 2022-05-13 (×2): qty 1

## 2022-05-13 MED ORDER — TRAMADOL HCL 50 MG PO TABS: 50.0000 mg | ORAL_TABLET | ORAL | Status: DC | PRN

## 2022-05-13 MED ORDER — FENTANYL CITRATE (PF) 100 MCG/2ML IJ SOLN
INTRAMUSCULAR | Status: DC | PRN
  Administered 2022-05-13 (×2): 25 ug via INTRAVENOUS

## 2022-05-13 MED ORDER — DEXAMETHASONE SODIUM PHOSPHATE 10 MG/ML IJ SOLN
INTRAMUSCULAR | Status: AC
  Administered 2022-05-13: 8 mg via INTRAVENOUS
  Filled 2022-05-13: qty 1

## 2022-05-13 MED ORDER — ONDANSETRON HCL 4 MG PO TABS: 4.0000 mg | ORAL_TABLET | Freq: Four times a day (QID) | ORAL | Status: DC | PRN

## 2022-05-13 MED ORDER — IRRISEPT - 450ML BOTTLE WITH 0.05% CHG IN STERILE WATER, USP 99.95% OPTIME
TOPICAL | Status: DC | PRN
  Administered 2022-05-13: 450 mL

## 2022-05-13 MED ORDER — SODIUM CHLORIDE 0.9 % IV SOLN: INTRAVENOUS | Status: DC

## 2022-05-13 SURGICAL SUPPLY — 71 items
ATTUNE PS FEM RT SZ 4 CEM KNEE (Femur) IMPLANT
ATTUNE PSRP INSR SZ4 8 KNEE (Insert) IMPLANT
BASE TIBIAL ROT PLAT SZ 5 KNEE (Knees) IMPLANT
BATTERY INSTRU NAVIGATION (MISCELLANEOUS) ×4 IMPLANT
BLADE SAW 70X12.5 (BLADE) ×1 IMPLANT
BLADE SAW 90X13X1.19 OSCILLAT (BLADE) ×1 IMPLANT
BLADE SAW 90X25X1.19 OSCILLAT (BLADE) ×1 IMPLANT
BONE CEMENT GENTAMICIN (Cement) ×2 IMPLANT
CEMENT BONE GENTAMICIN 40 (Cement) IMPLANT
COOLER POLAR GLACIER W/PUMP (MISCELLANEOUS) ×1 IMPLANT
CUFF TOURN SGL QUICK 24 (TOURNIQUET CUFF)
CUFF TOURN SGL QUICK 34 (TOURNIQUET CUFF)
CUFF TRNQT CYL 24X4X16.5-23 (TOURNIQUET CUFF) IMPLANT
CUFF TRNQT CYL 34X4.125X (TOURNIQUET CUFF) IMPLANT
DRAPE 3/4 80X56 (DRAPES) ×1 IMPLANT
DRAPE INCISE IOBAN 66X45 STRL (DRAPES) IMPLANT
DRSG MEPILEX SACRM 8.7X9.8 (GAUZE/BANDAGES/DRESSINGS) ×1 IMPLANT
DRSG NON-ADHERENT DERMACEA 3X4 (GAUZE/BANDAGES/DRESSINGS) ×1 IMPLANT
DRSG OPSITE POSTOP 4X14 (GAUZE/BANDAGES/DRESSINGS) ×1 IMPLANT
DRSG TEGADERM 4X4.75 (GAUZE/BANDAGES/DRESSINGS) ×1 IMPLANT
DURAPREP 26ML APPLICATOR (WOUND CARE) ×2 IMPLANT
ELECT CAUTERY BLADE 6.4 (BLADE) ×1 IMPLANT
ELECT REM PT RETURN 9FT ADLT (ELECTROSURGICAL) ×1
ELECTRODE REM PT RTRN 9FT ADLT (ELECTROSURGICAL) ×1 IMPLANT
EX-PIN ORTHOLOCK NAV 4X150 (PIN) ×2 IMPLANT
GLOVE BIOGEL M STRL SZ7.5 (GLOVE) ×2 IMPLANT
GLOVE SRG 8 PF TXTR STRL LF DI (GLOVE) ×1 IMPLANT
GLOVE SURG UNDER POLY LF SZ8 (GLOVE) ×1
GOWN STRL REUS W/ TWL LRG LVL3 (GOWN DISPOSABLE) ×1 IMPLANT
GOWN STRL REUS W/TWL LRG LVL3 (GOWN DISPOSABLE) ×1
GOWN TOGA ZIPPER T7+ PEEL AWAY (MISCELLANEOUS) ×1 IMPLANT
HEMOVAC 400CC 10FR (MISCELLANEOUS) ×1 IMPLANT
HOLDER FOLEY CATH W/STRAP (MISCELLANEOUS) ×1 IMPLANT
HOOD PEEL AWAY T7 (MISCELLANEOUS) IMPLANT
IV NS IRRIG 3000ML ARTHROMATIC (IV SOLUTION) ×1 IMPLANT
JET LAVAGE IRRISEPT WOUND (IRRIGATION / IRRIGATOR) ×1
KIT TURNOVER KIT A (KITS) ×1 IMPLANT
KNIFE SCULPS 14X20 (INSTRUMENTS) ×1 IMPLANT
LAVAGE JET IRRISEPT WOUND (IRRIGATION / IRRIGATOR) IMPLANT
MANIFOLD NEPTUNE II (INSTRUMENTS) ×2 IMPLANT
NDL SPNL 20GX3.5 QUINCKE YW (NEEDLE) ×2 IMPLANT
NEEDLE SPNL 20GX3.5 QUINCKE YW (NEEDLE) ×2 IMPLANT
NS IRRIG 500ML POUR BTL (IV SOLUTION) ×1 IMPLANT
PACK TOTAL KNEE (MISCELLANEOUS) ×1 IMPLANT
PAD ABD DERMACEA PRESS 5X9 (GAUZE/BANDAGES/DRESSINGS) ×2 IMPLANT
PAD WRAPON POLAR KNEE (MISCELLANEOUS) ×1 IMPLANT
PATELLA MEDIAL ATTUN 35MM KNEE (Knees) IMPLANT
PIN DRILL FIX HALF THREAD (BIT) ×2 IMPLANT
PIN FIXATION 1/8DIA X 3INL (PIN) ×1 IMPLANT
PULSAVAC PLUS IRRIG FAN TIP (DISPOSABLE) ×1
SOL PREP PVP 2OZ (MISCELLANEOUS) ×1
SOLUTION IRRIG SURGIPHOR (IV SOLUTION) ×1 IMPLANT
SOLUTION PREP PVP 2OZ (MISCELLANEOUS) ×1 IMPLANT
SPONGE DRAIN TRACH 4X4 STRL 2S (GAUZE/BANDAGES/DRESSINGS) ×1 IMPLANT
STAPLER SKIN PROX 35W (STAPLE) ×1 IMPLANT
STOCKINETTE IMPERV 14X48 (MISCELLANEOUS) ×1 IMPLANT
STRAP TIBIA SHORT (MISCELLANEOUS) ×1 IMPLANT
SUCTION FRAZIER HANDLE 10FR (MISCELLANEOUS) ×1
SUCTION TUBE FRAZIER 10FR DISP (MISCELLANEOUS) ×1 IMPLANT
SUT VIC AB 0 CT1 36 (SUTURE) ×1 IMPLANT
SUT VIC AB 1 CT1 36 (SUTURE) ×2 IMPLANT
SUT VIC AB 2-0 CT2 27 (SUTURE) ×1 IMPLANT
SYR 30ML LL (SYRINGE) ×2 IMPLANT
TIBIAL BASE ROT PLAT SZ 5 KNEE (Knees) ×1 IMPLANT
TIP FAN IRRIG PULSAVAC PLUS (DISPOSABLE) ×1 IMPLANT
TOWEL OR 17X26 4PK STRL BLUE (TOWEL DISPOSABLE) IMPLANT
TOWER CARTRIDGE SMART MIX (DISPOSABLE) ×1 IMPLANT
TRAP FLUID SMOKE EVACUATOR (MISCELLANEOUS) ×1 IMPLANT
TRAY FOLEY MTR SLVR 16FR STAT (SET/KITS/TRAYS/PACK) ×1 IMPLANT
WATER STERILE IRR 1000ML POUR (IV SOLUTION) ×1 IMPLANT
WRAPON POLAR PAD KNEE (MISCELLANEOUS) ×1

## 2022-05-13 NOTE — Interval H&P Note (Signed)
History and Physical Interval Note:  05/13/2022 6:12 AM  April Larsen  has presented today for surgery, with the diagnosis of PRIMARY OSTEOARTHRITIS OF RIGHT KNEE..  The various methods of treatment have been discussed with the patient and family. After consideration of risks, benefits and other options for treatment, the patient has consented to  Procedure(s): COMPUTER ASSISTED TOTAL KNEE ARTHROPLASTY - RNFA (Right) as a surgical intervention.  The patient's history has been reviewed, patient examined, no change in status, stable for surgery.  I have reviewed the patient's chart and labs.  Questions were answered to the patient's satisfaction.     [place]

## 2022-05-13 NOTE — Plan of Care (Signed)

## 2022-05-13 NOTE — Anesthesia Preprocedure Evaluation (Addendum)
Anesthesia Evaluation  Patient identified by MRN, date of birth, ID band Patient awake    Reviewed: Allergy & Precautions, NPO status , Patient's Chart, lab work & pertinent test results  History of Anesthesia Complications (+) PONV and history of anesthetic complications  Airway Mallampati: III   Neck ROM: Full    Dental  (+) Missing   Pulmonary neg pulmonary ROS   Pulmonary exam normal breath sounds clear to auscultation       Cardiovascular hypertension, Normal cardiovascular exam Rhythm:Regular Rate:Normal  ECG 05/01/22:  Normal sinus rhythm Low voltage QRS Cannot rule out Anterior infarct (cited on or before 27-Jun-2021)   Neuro/Psych  Headaches PSYCHIATRIC DISORDERS Anxiety     Scoliosis s/p thoracic to lumbar fusion    GI/Hepatic hiatal hernia, PUD (ulcerative colitis),GERD  ,,  Endo/Other   Hyperthyroidism   Renal/GU negative Renal ROS     Musculoskeletal  (+) Arthritis ,    Abdominal   Peds  Hematology  (+) Blood dyscrasia, anemia   Anesthesia Other Findings   Reproductive/Obstetrics                              Anesthesia Physical Anesthesia Plan  ASA: 2  Anesthesia Plan: General and Spinal   Post-op Pain Management:    Induction: Intravenous  PONV Risk Score and Plan: 4 or greater and Propofol infusion, TIVA, Treatment may vary due to age or medical condition, Ondansetron and Dexamethasone  Airway Management Planned: Natural Airway and Nasal Cannula  Additional Equipment:   Intra-op Plan:   Post-operative Plan:   Informed Consent: I have reviewed the patients History and Physical, chart, labs and discussed the procedure including the risks, benefits and alternatives for the proposed anesthesia with the patient or authorized representative who has indicated his/her understanding and acceptance.       Plan Discussed with: CRNA  Anesthesia Plan Comments:  (Plan for spinal and GA with natural airway, LMA/GETA backup.  Patient consented for risks of anesthesia including but not limited to:  - adverse reactions to medications - damage to eyes, teeth, lips or other oral mucosa - nerve damage due to positioning  - sore throat or hoarseness - headache, bleeding, infection, nerve damage 2/2 spinal - damage to heart, brain, nerves, lungs, other parts of body or loss of life  Informed patient about role of CRNA in peri- and intra-operative care.  Patient voiced understanding.)         Anesthesia Quick Evaluation

## 2022-05-13 NOTE — Anesthesia Procedure Notes (Addendum)
Spinal  Patient location during procedure: OR Start time: 05/13/2022 7:23 AM End time: 05/13/2022 7:31 AM Reason for block: surgical anesthesia Staffing Performed: resident/CRNA  Anesthesiologist: Darrin Nipper, MD Resident/CRNA: Fredderick Phenix, CRNA Performed by: Fredderick Phenix, CRNA Authorized by: Darrin Nipper, MD   Preanesthetic Checklist Completed: patient identified, IV checked, site marked, risks and benefits discussed, surgical consent, monitors and equipment checked, pre-op evaluation and timeout performed Spinal Block Patient position: sitting Prep: DuraPrep Patient monitoring: heart rate, cardiac monitor, continuous pulse ox and blood pressure Approach: midline Location: L3-4 Injection technique: single-shot Needle Needle type: Sprotte and Quincke  Needle gauge: 22 G Needle length: 9 cm Assessment Sensory level: T4 Events: CSF return

## 2022-05-13 NOTE — Anesthesia Postprocedure Evaluation (Signed)
Anesthesia Post Note  Patient: April Larsen  Procedure(s) Performed: COMPUTER ASSISTED TOTAL KNEE ARTHROPLASTY (Right: Knee)  Patient location during evaluation: PACU Anesthesia Type: Spinal Level of consciousness: awake and alert, oriented and patient cooperative Pain management: pain level controlled Vital Signs Assessment: post-procedure vital signs reviewed and stable Respiratory status: spontaneous breathing, nonlabored ventilation and respiratory function stable Cardiovascular status: blood pressure returned to baseline and stable Postop Assessment: adequate PO intake, no headache, no backache and spinal receding Anesthetic complications: no   No notable events documented.   Last Vitals:  Vitals:   05/13/22 1115 05/13/22 1130  BP: 108/80   Pulse: 70 71  Resp: 15 14  Temp:    SpO2: 97% 99%    Last Pain:  Vitals:   05/13/22 1130  TempSrc:   PainSc: 0-No pain    LLE Motor Response: Purposeful movement (05/13/22 1130) LLE Sensation: Decreased (05/13/22 1130) RLE Motor Response: Purposeful movement (05/13/22 1130) RLE Sensation: Decreased (05/13/22 1130)      Darrin Nipper

## 2022-05-13 NOTE — Evaluation (Addendum)
Physical Therapy Evaluation Patient Details Name: April Larsen MRN: [id number] DOB: 1945-09-24 Today's Date: 05/13/2022  History of Present Illness  SNIYAH KRZYWICKI  has presented today for surgery, with the diagnosis of PRIMARY OSTEOARTHRITIS OF RIGHT KNEE.. The patient underwent :  COMPUTER ASSISTED TOTAL KNEE ARTHROPLASTY - RNFA (Right) as a surgical intervention. WBAT to RLE.  Clinical Impression  Pt received in bed agreeable to PT evaluation. Pt reported of sore throat and need to cough. Pt PLOF: ambulation with AD due to worsening knee pain. Pt ascended and descended Stairs side ways due to pain. Pt has fallen in the past. Pt tends to be impulsive. Today pt able to perform SLR to ~ 12 inches. Point discrimination sensation impaired which may be due to scoliosis which was corrected surgically. Sitting balance normal and standing good with FWW.  Pt performed Bed mobility with min guard to sup and ambulated ~35 to 40 ft with CGA of 1 with VC to use FWW properly and to slow down to prevent LOB. PT introduced pt to all TKR exs and provided pt with H/O so that the pt can perform them every 2 hours 10 reps each. Written instruction on white board provided fro proper carryover of HEP.  Pt RLE on heel foam and educated pt to not put a pillow under knee unless she is performing TKE ex.  Pt demonstrated good understanding. Drainage Hemovac in place. PT will continue BID in acute care. Pt will attempt Steps tomorrow AM session so that pt can return home with HHPT, BSC.    Recommendations for follow up therapy are one component of a multi-disciplinary discharge planning process, led by the attending physician.  Recommendations may be updated based on patient status, additional functional criteria and insurance authorization.  Follow Up Recommendations Home health PT      Assistance Recommended at Discharge Intermittent Supervision/Assistance  Patient can return home with the following  A little help with  walking and/or transfers;A little help with bathing/dressing/bathroom;Assistance with cooking/housework;Assist for transportation;Help with stairs or ramp for entrance    Equipment Recommendations BSC/3in1 (for night use in the beginning.)  Recommendations for Other Services       Functional Status Assessment Patient has had a recent decline in their functional status and demonstrates the ability to make significant improvements in function in a reasonable and predictable amount of time.     Precautions / Restrictions Precautions Precautions: Fall Restrictions Weight Bearing Restrictions: Yes RLE Weight Bearing: Weight bearing as tolerated      Mobility  Bed Mobility Overal bed mobility: Needs Assistance Bed Mobility: Supine to Sit, Sit to Supine     Supine to sit: Supervision Sit to supine: Supervision   General bed mobility comments: impulsive    Transfers Overall transfer level: Needs assistance Equipment used: Rolling walker (2 wheels) Transfers: Sit to/from Stand Sit to Stand: Min guard           General transfer comment: VC to keep R foot n front of L and to slow down.    Ambulation/Gait Ambulation/Gait assistance: Min guard Gait Distance (Feet): 35 Feet Assistive device: Rolling walker (2 wheels) Gait Pattern/deviations: Step-to pattern Gait velocity: dec     General Gait Details: decreased loading response  Stairs            Wheelchair Mobility    Modified Rankin (Stroke Patients Only)       Balance Overall balance assessment: Needs assistance Sitting-balance support: Single extremity supported Sitting balance-Leahy Scale: Normal  Standing balance support: Bilateral upper extremity supported Standing balance-Leahy Scale: Good Standing balance comment: No unsteadinessnoted, but remains impulsive and needs VC to hold the FWW.                             Pertinent Vitals/Pain Pain Assessment Pain Assessment:  0-10 Pain Score: 4  Pain Location: R knee Pain Descriptors / Indicators: Aching (intermittent) Pain Intervention(s): Monitored during session    Home Living Family/patient expects to be discharged to:: Private residence Living Arrangements: Children Available Help at Discharge: Family Type of Home: House Home Access: Stairs to enter Entrance Stairs-Rails: Right Entrance Stairs-Number of Steps: 3 with one HR   Home Layout: One level Home Equipment: Advice worker (2 wheels);Transport chair;Cane - single point;Toilet riser Additional Comments: Son has special needs.    Prior Function Prior Level of Function : Independent/Modified Independent;Other (comment)             Mobility Comments: Pt Mod Ind with SPC at household level and community level and as the OA progresed pt used FWW with all funcitonal mobility. ascended and descended steps side ways using one HR 2/2 to severe pain. ADLs Comments: Pt was previously MOD I for ADL tasks but needed sup 2/2 to knee pain and weakness in RLE>     Hand Dominance   Dominant Hand: Right    Extremity/Trunk Assessment   Upper Extremity Assessment Upper Extremity Assessment: Overall WFL for tasks assessed    Lower Extremity Assessment Lower Extremity Assessment: RLE deficits/detail RLE Deficits / Details: Light touch sensation impaired specific to point discrimination. in BLE RLE: Unable to fully assess due to pain RLE Sensation: decreased light touch RLE Coordination: WNL       Communication   Communication: No difficulties  Cognition Arousal/Alertness: Awake/alert Behavior During Therapy: WFL for tasks assessed/performed, Impulsive Overall Cognitive Status: Within Functional Limits for tasks assessed                                          General Comments      Exercises Total Joint Exercises Ankle Circles/Pumps: AROM, Both, 10 reps, Supine Quad Sets: Right, 10 reps, Supine Short Arc  Quad: AROM, 10 reps, Right, Supine Heel Slides: AROM, 10 reps, Supine, Seated Hip ABduction/ADduction: AROM, Right, 10 reps, Supine Straight Leg Raises: AROM, Right, 10 reps, Supine Long Arc Quad: AROM, Right, 10 reps, Seated Knee Flexion: AROM, Right, 10 reps, Seated Goniometric ROM: R knee 0 to 85 ( in ace wrap) degrees in sitting. Other Exercises Other Exercises: Pt provided with TKA hand out to perfrom ex every 2 hours 10 reps and written instruction on white board.   Assessment/Plan    PT Assessment Patient needs continued PT services  PT Problem List Decreased strength;Decreased range of motion;Decreased activity tolerance;Decreased balance;Decreased mobility;Decreased knowledge of precautions;Decreased safety awareness;Impaired sensation;Pain       PT Treatment Interventions Gait training;Stair training;Functional mobility training;Therapeutic activities;Therapeutic exercise;Balance training;Patient/family education    PT Goals (Current goals can be found in the Care Plan section)  Acute Rehab PT Goals Patient Stated Goal: " I want to slow down and go home with home PT." PT Goal Formulation: With patient Time For Goal Achievement: 05/27/22 Potential to Achieve Goals: Good    Frequency BID     Co-evaluation  AM-PAC PT "6 Clicks" Mobility  Outcome Measure Help needed turning from your back to your side while in a flat bed without using bedrails?: None Help needed moving from lying on your back to sitting on the side of a flat bed without using bedrails?: A Little Help needed moving to and from a bed to a chair (including a wheelchair)?: A Little Help needed standing up from a chair using your arms (e.g., wheelchair or bedside chair)?: A Little Help needed to walk in hospital room?: A Little Help needed climbing 3-5 steps with a railing? : A Lot 6 Click Score: 18    End of Session Equipment Utilized During Treatment: Gait belt Activity Tolerance:  Patient tolerated treatment well Patient left: in bed;with bed alarm set;with call bell/phone within reach;with family/visitor present Nurse Communication: Mobility status;Weight bearing status (about throat irritation and impulsive behavior. TO access bahroom with help using FWW and to use BSC at night.) PT Visit Diagnosis: Muscle weakness (generalized) (M62.81);History of falling (Z91.81);Difficulty in walking, not elsewhere classified (R26.2);Pain    Time: 1454-1531 PT Time Calculation (min) (ACUTE ONLY): 37 min   Charges:   PT Evaluation $PT Eval Low Complexity: 1 Low PT Treatments $Gait Training: 8-22 mins $Therapeutic Exercise: 8-22 mins   Nabilah Davoli PT DPT 4:20 PM,05/13/22

## 2022-05-13 NOTE — Op Note (Addendum)
OPERATIVE NOTE  DATE OF SURGERY:  05/13/2022  PATIENT NAME:  April Larsen   DOB: [birth year]  MRN: [id number]  PRE-OPERATIVE DIAGNOSIS: Degenerative arthrosis of the right knee, primary  POST-OPERATIVE DIAGNOSIS:  Same  PROCEDURE:  Right total knee arthroplasty using computer-assisted navigation  SURGEON:  Marciano Sequin. M.D.  ANESTHESIA: spinal  ESTIMATED BLOOD LOSS: 50 mL  FLUIDS REPLACED: 1300 mL of crystalloid  TOURNIQUET TIME: 65 minutes  DRAINS: 2 medium Hemovac drains  SOFT TISSUE RELEASES: Anterior cruciate ligament, posterior cruciate ligament, deep medial collateral ligament, patellofemoral ligament  IMPLANTS UTILIZED: DePuy Attune size 4 posterior stabilized femoral component (cemented), size 5 rotating platform tibial component (cemented), 35 mm medialized dome patella (cemented), and an 8 mm stabilized rotating platform polyethylene insert.  INDICATIONS FOR SURGERY: April Larsen is a 77 y.o. year old female with a long history of progressive knee pain. X-rays demonstrated severe degenerative changes in tricompartmental fashion. The patient had not seen any significant improvement despite conservative nonsurgical intervention. After discussion of the risks and benefits of surgical intervention, the patient expressed understanding of the risks benefits and agree with plans for total knee arthroplasty.   The risks, benefits, and alternatives were discussed at length including but not limited to the risks of infection, bleeding, nerve injury, stiffness, blood clots, the need for revision surgery, cardiopulmonary complications, among others, and they were willing to proceed.  PROCEDURE IN DETAIL: The patient was brought into the operating room and, after adequate spinal anesthesia was achieved, a tourniquet was placed on the patient's upper thigh. The patient's knee and leg were cleaned and prepped with alcohol and DuraPrep and draped in the usual sterile fashion. A  "timeout" was performed as per usual protocol. The lower extremity was exsanguinated using an Esmarch, and the tourniquet was inflated to 300 mmHg. An anterior longitudinal incision was made followed by a standard mid vastus approach. The deep fibers of the medial collateral ligament were elevated in a subperiosteal fashion off of the medial flare of the tibia so as to maintain a continuous soft tissue sleeve. The patella was subluxed laterally and the patellofemoral ligament was incised. Inspection of the knee demonstrated severe degenerative changes with full-thickness loss of articular cartilage. Osteophytes were debrided using a rongeur. Anterior and posterior cruciate ligaments were excised. Two 4.0 mm Schanz pins were inserted in the femur and into the tibia for attachment of the array of trackers used for computer-assisted navigation. Hip center was identified using a circumduction technique. Distal landmarks were mapped using the computer. The distal femur and proximal tibia were mapped using the computer. The distal femoral cutting guide was positioned using computer-assisted navigation so as to achieve a 5 distal valgus cut. The femur was sized and it was felt that a size 4 femoral component was appropriate. A size 4 femoral cutting guide was positioned and the anterior cut was performed and verified using the computer. This was followed by completion of the posterior and chamfer cuts. Femoral cutting guide for the central box was then positioned in the center box cut was performed.  Attention was then directed to the proximal tibia. Medial and lateral menisci were excised. The extramedullary tibial cutting guide was positioned using computer-assisted navigation so as to achieve a 0 varus-valgus alignment and 3 posterior slope. The cut was performed and verified using the computer. The proximal tibia was sized and it was felt that a size 5 tibial tray was appropriate. Tibial and femoral trials were  inserted followed  by insertion of an 8 mm polyethylene insert. This allowed for excellent mediolateral soft tissue balancing both in flexion and in full extension. Finally, the patella was cut and prepared so as to accommodate a 35 mm medialized dome patella. A patella trial was placed and the knee was placed through a range of motion with excellent patellar tracking appreciated. The femoral trial was removed after debridement of posterior osteophytes. The central post-hole for the tibial component was reamed followed by insertion of a keel punch. Tibial trials were then removed. Cut surfaces of bone were irrigated with copious amounts of normal saline using pulsatile lavage and then suctioned dry. Polymethylmethacrylate cement with gentamicin was prepared in the usual fashion using a vacuum mixer. Cement was applied to the cut surface of the proximal tibia as well as along the undersurface of a size 5 rotating platform tibial component. Tibial component was positioned and impacted into place. Excess cement was removed using [employer] fast streamer. Cement was then applied to the cut surfaces of the femur as well as along the posterior flanges of the size 4 femoral component. The femoral component was positioned and impacted into place. Excess cement was removed using [employer] fast streamer. An 8 mm polyethylene trial was inserted and the knee was brought into full extension with steady axial compression applied. Finally, cement was applied to the backside of a 35 mm medialized dome patella and the patellar component was positioned and patellar clamp applied. Excess cement was removed using [employer] fast streamer. After adequate curing of the cement, the tourniquet was deflated after a total tourniquet time of 65 minutes. Hemostasis was achieved using electrocautery. The knee was irrigated with copious amounts of normal saline using pulsatile lavage followed by 450 ml of Irrisept and then suctioned dry. 20 mL of 1.3% Exparel and 60 mL of  0.25% Marcaine in 40 mL of normal saline was injected along the posterior capsule, medial and lateral gutters, and along the arthrotomy site. An 8 mm stabilized rotating platform polyethylene insert was inserted and the knee was placed through a range of motion with excellent mediolateral soft tissue balancing appreciated and excellent patellar tracking noted. 2 medium drains were placed in the wound bed and brought out through separate stab incisions. The medial parapatellar portion of the incision was reapproximated using interrupted sutures of #1 Vicryl. Subcutaneous tissue was approximated in layers using first #0 Vicryl followed #2-0 Vicryl. The skin was approximated with skin staples. A sterile dressing was applied.  The patient tolerated the procedure well and was transported to the recovery room in stable condition.    Eirik Schueler P. Holley Bouche., M.D.

## 2022-05-13 NOTE — Transfer of Care (Signed)
Immediate Anesthesia Transfer of Care Note  Patient: April Larsen  Procedure(s) Performed: COMPUTER ASSISTED TOTAL KNEE ARTHROPLASTY (Right: Knee)  Patient Location: PACU  Anesthesia Type:Spinal  Level of Consciousness: awake, alert , and oriented  Airway & Oxygen Therapy: Patient Spontanous Breathing  Post-op Assessment: Report given to RN and Post -op Vital signs reviewed and stable  Post vital signs: Reviewed and stable  Last Vitals:  Vitals Value Taken Time  BP 105/66 05/13/22 1030  Temp    Pulse 77 05/13/22 1030  Resp 15 05/13/22 1030  SpO2 96 % 05/13/22 1030  Vitals shown include unvalidated device data.  Last Pain:  Vitals:   05/13/22 0621  TempSrc: Temporal  PainSc: 7          Complications: No notable events documented.

## 2022-05-13 NOTE — TOC Progression Note (Signed)
Transition of Care [facility]) - Progression Note    Patient Details  Name: April Larsen MRN: [id number] Date of Birth: 07-Feb-1946  Transition of [facility]) CM/SW [place], RN Phone Number: 05/13/2022, 11:55 AM  Clinical Narrative:    The patient is set up with [place] for [facility] services, she attended the Joint in motion class prior to surgery and indicated that she has a RW and 3 in 1 and will not need additional services or DME    Expected Discharge Plan: [place] Barriers to Discharge: No Barriers Identified  Expected Discharge Plan and Services   Discharge Planning Services: CM Consult   Living arrangements for the past 2 months: Single Family Home                   DME Agency: NA       HH Arranged: PT HH Agency: [place] Date [place]: 05/13/22 Time [place]: [place] Representative spoke with at [place]: [place]   Social Determinants of Health ([place]) Interventions SDOH Screenings   Tobacco Use: Low Risk  (05/13/2022)    Readmission Risk Interventions     No data to display

## 2022-05-14 ENCOUNTER — Encounter: Payer: Self-pay | Admitting: Orthopedic Surgery

## 2022-05-14 ENCOUNTER — Telehealth: Payer: Self-pay

## 2022-05-14 DIAGNOSIS — M1711 Unilateral primary osteoarthritis, right knee: Secondary | ICD-10-CM | POA: Diagnosis not present

## 2022-05-14 DIAGNOSIS — Z96651 Presence of right artificial knee joint: Secondary | ICD-10-CM | POA: Diagnosis not present

## 2022-05-14 MED ORDER — TRAMADOL HCL 50 MG PO TABS: 50.0000 mg | ORAL_TABLET | ORAL | 0 refills | Status: DC | PRN

## 2022-05-14 MED ORDER — ENOXAPARIN SODIUM 40 MG/0.4ML IJ SOSY: 40.0000 mg | PREFILLED_SYRINGE | INTRAMUSCULAR | 0 refills | Status: DC

## 2022-05-14 MED ORDER — OXYCODONE HCL 5 MG PO TABS: 5.0000 mg | ORAL_TABLET | ORAL | 0 refills | Status: DC | PRN

## 2022-05-14 NOTE — Progress Notes (Signed)
Patient is not able to walk the distance required to go the bathroom, or he/she is unable to safely negotiate stairs required to access the bathroom.  A 3in1 BSC will alleviate this problem  April Larsen P. Holley Bouche M.D.

## 2022-05-14 NOTE — Plan of Care (Signed)
Problem: Education: Goal: Knowledge of the prescribed therapeutic regimen will improve 05/14/2022 1014 by Alferd Apa, RN Outcome: Progressing 05/14/2022 0800 by Alferd Apa, RN Outcome: Progressing Goal: Individualized Educational Video(s) 05/14/2022 1014 by Alferd Apa, RN Outcome: Progressing 05/14/2022 0800 by Alferd Apa, RN Outcome: Progressing   Problem: Activity: Goal: Ability to avoid complications of mobility impairment will improve 05/14/2022 1014 by Alferd Apa, RN Outcome: Progressing 05/14/2022 0800 by Alferd Apa, RN Outcome: Progressing Goal: Range of joint motion will improve 05/14/2022 1014 by Alferd Apa, RN Outcome: Progressing 05/14/2022 0800 by Alferd Apa, RN Outcome: Progressing   Problem: Clinical Measurements: Goal: Postoperative complications will be avoided or minimized 05/14/2022 1014 by Alferd Apa, RN Outcome: Progressing 05/14/2022 0800 by Alferd Apa, RN Outcome: Progressing   Problem: Pain Management: Goal: Pain level will decrease with appropriate interventions 05/14/2022 1014 by Alferd Apa, RN Outcome: Progressing 05/14/2022 0800 by Alferd Apa, RN Outcome: Progressing   Problem: Skin Integrity: Goal: Will show signs of wound healing 05/14/2022 1014 by Alferd Apa, RN Outcome: Progressing 05/14/2022 0800 by Alferd Apa, RN Outcome: Progressing   Problem: Education: Goal: Knowledge of General Education information will improve Description: Including pain rating scale, medication(s)/side effects and non-pharmacologic comfort measures 05/14/2022 1014 by Alferd Apa, RN Outcome: Progressing 05/14/2022 0800 by Alferd Apa, RN Outcome: Progressing   Problem: Health Behavior/Discharge Planning: Goal: Ability to manage health-related needs will improve 05/14/2022 1014 by Alferd Apa, RN Outcome: Progressing 05/14/2022 0800 by Alferd Apa, RN Outcome: Progressing   Problem: Clinical Measurements: Goal:  Ability to maintain clinical measurements within normal limits will improve 05/14/2022 1014 by Alferd Apa, RN Outcome: Progressing 05/14/2022 0800 by Alferd Apa, RN Outcome: Progressing Goal: Will remain free from infection 05/14/2022 1014 by Alferd Apa, RN Outcome: Progressing 05/14/2022 0800 by Alferd Apa, RN Outcome: Progressing Goal: Diagnostic test results will improve 05/14/2022 1014 by Alferd Apa, RN Outcome: Progressing 05/14/2022 0800 by Alferd Apa, RN Outcome: Progressing Goal: Respiratory complications will improve 05/14/2022 1014 by Alferd Apa, RN Outcome: Progressing 05/14/2022 0800 by Alferd Apa, RN Outcome: Progressing Goal: Cardiovascular complication will be avoided 05/14/2022 1014 by Alferd Apa, RN Outcome: Progressing 05/14/2022 0800 by Alferd Apa, RN Outcome: Progressing   Problem: Activity: Goal: Risk for activity intolerance will decrease 05/14/2022 1014 by Alferd Apa, RN Outcome: Progressing 05/14/2022 0800 by Alferd Apa, RN Outcome: Progressing   Problem: Nutrition: Goal: Adequate nutrition will be maintained 05/14/2022 1014 by Alferd Apa, RN Outcome: Progressing 05/14/2022 0800 by Alferd Apa, RN Outcome: Progressing   Problem: Coping: Goal: Level of anxiety will decrease 05/14/2022 1014 by Alferd Apa, RN Outcome: Progressing 05/14/2022 0800 by Alferd Apa, RN Outcome: Progressing   Problem: Elimination: Goal: Will not experience complications related to bowel motility 05/14/2022 1014 by Alferd Apa, RN Outcome: Progressing 05/14/2022 0800 by Alferd Apa, RN Outcome: Progressing Goal: Will not experience complications related to urinary retention 05/14/2022 1014 by Alferd Apa, RN Outcome: Progressing 05/14/2022 0800 by Alferd Apa, RN Outcome: Progressing   Problem: Pain Managment: Goal: General experience of comfort will improve 05/14/2022 1014 by Alferd Apa, RN Outcome: Progressing 05/14/2022  0800 by Alferd Apa, RN Outcome: Progressing   Problem: Safety: Goal: Ability to remain free from injury will improve 05/14/2022 1014 by Alferd Apa, RN Outcome: Progressing 05/14/2022  0800 by Alferd Apa, RN Outcome: Progressing   Problem: Skin Integrity: Goal: Risk for impaired skin integrity will decrease 05/14/2022 1014 by Alferd Apa, RN Outcome: Progressing 05/14/2022 0800 by Alferd Apa, RN Outcome: Progressing   Problem: Education: Goal: Knowledge of General Education information will improve Description: Including pain rating scale, medication(s)/side effects and non-pharmacologic comfort measures 05/14/2022 1014 by Alferd Apa, RN Outcome: Progressing 05/14/2022 0800 by Alferd Apa, RN Outcome: Progressing   Problem: Health Behavior/Discharge Planning: Goal: Ability to manage health-related needs will improve 05/14/2022 1014 by Alferd Apa, RN Outcome: Progressing 05/14/2022 0800 by Alferd Apa, RN Outcome: Progressing   Problem: Clinical Measurements: Goal: Ability to maintain clinical measurements within normal limits will improve 05/14/2022 1014 by Alferd Apa, RN Outcome: Progressing 05/14/2022 0800 by Alferd Apa, RN Outcome: Progressing Goal: Will remain free from infection 05/14/2022 1014 by Alferd Apa, RN Outcome: Progressing 05/14/2022 0800 by Alferd Apa, RN Outcome: Progressing Goal: Diagnostic test results will improve 05/14/2022 1014 by Alferd Apa, RN Outcome: Progressing 05/14/2022 0800 by Alferd Apa, RN Outcome: Progressing Goal: Respiratory complications will improve 05/14/2022 1014 by Alferd Apa, RN Outcome: Progressing 05/14/2022 0800 by Alferd Apa, RN Outcome: Progressing Goal: Cardiovascular complication will be avoided 05/14/2022 1014 by Alferd Apa, RN Outcome: Progressing 05/14/2022 0800 by Alferd Apa, RN Outcome: Progressing   Problem: Activity: Goal: Risk for activity intolerance will  decrease 05/14/2022 1014 by Alferd Apa, RN Outcome: Progressing 05/14/2022 0800 by Alferd Apa, RN Outcome: Progressing   Problem: Nutrition: Goal: Adequate nutrition will be maintained 05/14/2022 1014 by Alferd Apa, RN Outcome: Progressing 05/14/2022 0800 by Alferd Apa, RN Outcome: Progressing   Problem: Coping: Goal: Level of anxiety will decrease 05/14/2022 1014 by Alferd Apa, RN Outcome: Progressing 05/14/2022 0800 by Alferd Apa, RN Outcome: Progressing   Problem: Elimination: Goal: Will not experience complications related to bowel motility 05/14/2022 1014 by Alferd Apa, RN Outcome: Progressing 05/14/2022 0800 by Alferd Apa, RN Outcome: Progressing Goal: Will not experience complications related to urinary retention 05/14/2022 1014 by Alferd Apa, RN Outcome: Progressing 05/14/2022 0800 by Alferd Apa, RN Outcome: Progressing   Problem: Pain Managment: Goal: General experience of comfort will improve 05/14/2022 1014 by Alferd Apa, RN Outcome: Progressing 05/14/2022 0800 by Alferd Apa, RN Outcome: Progressing   Problem: Safety: Goal: Ability to remain free from injury will improve 05/14/2022 1014 by Alferd Apa, RN Outcome: Progressing 05/14/2022 0800 by Alferd Apa, RN Outcome: Progressing   Problem: Skin Integrity: Goal: Risk for impaired skin integrity will decrease 05/14/2022 1014 by Alferd Apa, RN Outcome: Progressing 05/14/2022 0800 by Alferd Apa, RN Outcome: Progressing

## 2022-05-14 NOTE — Plan of Care (Signed)
  Problem: Education: Goal: Knowledge of the prescribed therapeutic regimen will improve Outcome: Progressing Goal: Individualized Educational Video(s) Outcome: Progressing   Problem: Activity: Goal: Ability to avoid complications of mobility impairment will improve Outcome: Progressing Goal: Range of joint motion will improve Outcome: Progressing   Problem: Clinical Measurements: Goal: Postoperative complications will be avoided or minimized Outcome: Progressing   Problem: Pain Management: Goal: Pain level will decrease with appropriate interventions Outcome: Progressing   Problem: Skin Integrity: Goal: Will show signs of wound healing Outcome: Progressing   Problem: Education: Goal: Knowledge of General Education information will improve Description: Including pain rating scale, medication(s)/side effects and non-pharmacologic comfort measures Outcome: Progressing   Problem: Health Behavior/Discharge Planning: Goal: Ability to manage health-related needs will improve Outcome: Progressing   Problem: Clinical Measurements: Goal: Ability to maintain clinical measurements within normal limits will improve Outcome: Progressing Goal: Will remain free from infection Outcome: Progressing Goal: Diagnostic test results will improve Outcome: Progressing Goal: Respiratory complications will improve Outcome: Progressing Goal: Cardiovascular complication will be avoided Outcome: Progressing   Problem: Activity: Goal: Risk for activity intolerance will decrease Outcome: Progressing   Problem: Nutrition: Goal: Adequate nutrition will be maintained Outcome: Progressing   Problem: Coping: Goal: Level of anxiety will decrease Outcome: Progressing   Problem: Elimination: Goal: Will not experience complications related to bowel motility Outcome: Progressing Goal: Will not experience complications related to urinary retention Outcome: Progressing   Problem: Pain  Managment: Goal: General experience of comfort will improve Outcome: Progressing   Problem: Safety: Goal: Ability to remain free from injury will improve Outcome: Progressing   Problem: Skin Integrity: Goal: Risk for impaired skin integrity will decrease Outcome: Progressing   Problem: Education: Goal: Knowledge of General Education information will improve Description: Including pain rating scale, medication(s)/side effects and non-pharmacologic comfort measures Outcome: Progressing   Problem: Health Behavior/Discharge Planning: Goal: Ability to manage health-related needs will improve Outcome: Progressing   Problem: Clinical Measurements: Goal: Ability to maintain clinical measurements within normal limits will improve Outcome: Progressing Goal: Will remain free from infection Outcome: Progressing Goal: Diagnostic test results will improve Outcome: Progressing Goal: Respiratory complications will improve Outcome: Progressing Goal: Cardiovascular complication will be avoided Outcome: Progressing   Problem: Activity: Goal: Risk for activity intolerance will decrease Outcome: Progressing   Problem: Nutrition: Goal: Adequate nutrition will be maintained Outcome: Progressing   Problem: Coping: Goal: Level of anxiety will decrease Outcome: Progressing   Problem: Elimination: Goal: Will not experience complications related to bowel motility Outcome: Progressing Goal: Will not experience complications related to urinary retention Outcome: Progressing   Problem: Pain Managment: Goal: General experience of comfort will improve Outcome: Progressing   Problem: Safety: Goal: Ability to remain free from injury will improve Outcome: Progressing   Problem: Skin Integrity: Goal: Risk for impaired skin integrity will decrease Outcome: Progressing

## 2022-05-14 NOTE — Telephone Encounter (Signed)
Called [employer] patient assistance for Humira to check the status of patient application. She states the application is still pending but we should have a answer by tomorrow or early next week

## 2022-05-14 NOTE — Progress Notes (Signed)
Subjective: 1 Day Post-Op Procedure(s) (LRB): COMPUTER ASSISTED TOTAL KNEE ARTHROPLASTY (Right) Patient reports pain as mild.   Patient is well, and has had no acute complaints or problems Plan is to go Home after hospital stay. Negative for chest pain and shortness of breath Fever: no Gastrointestinal:negative for nausea and vomiting  Objective: Vital signs in last 24 hours: Temp:  [97.3 F (36.3 C)-98.4 F (36.9 C)] 97.8 F (36.6 C) (02/15 0407) Pulse Rate:  [60-77] 67 (02/15 0407) Resp:  [13-27] 20 (02/15 0407) BP: (98-137)/(56-88) 106/57 (02/15 0407) SpO2:  [95 %-100 %] 97 % (02/15 0407)  Intake/Output from previous day:  Intake/Output Summary (Last 24 hours) at 05/14/2022 0727 Last data filed at 05/14/2022 0600 Gross per 24 hour  Intake 1840.4 ml  Output 2195 ml  Net -354.6 ml    Intake/Output this shift: No intake/output data recorded.  Labs: No results for input(s): "HGB" in the last 72 hours. No results for input(s): "WBC", "RBC", "HCT", "PLT" in the last 72 hours. No results for input(s): "NA", "K", "CL", "CO2", "BUN", "CREATININE", "GLUCOSE", "CALCIUM" in the last 72 hours. No results for input(s): "LABPT", "INR" in the last 72 hours.   EXAM General - Patient is Alert and Oriented Extremity - Neurovascular intact Sensation intact distally Dorsiflexion/Plantar flexion intact Compartment soft Dressing/Incision - clean, dry, With mild drainage distally.  The Hemovac tubing was removed with no complication.  The Hemovac tubing was intact on removal. Motor Function - intact, moving foot and toes well on exam. Able to straight leg raise independently.  Past Medical History:  Diagnosis Date   Actinic keratosis    Anemia    LAST HGB 15.2 ON 04-22-16   Anxiety    Arthritis    Cancer ([place]) 2016   skin   Cataract (lens) fragments in eye following cataract surgery, bilateral 2013   one eye done then 4 weeks later the other eye done.   Chronic ulcerative  colitis, without complications ([place]) 123XX123   Diverticulitis 2018   Esophagitis, reflux 10/04/2014   Family history of adverse reaction to anesthesia    SISTER HARD TO WAKE UP and nausea and vomiting.   Female stress incontinence 08/09/2013   Gastroesophageal reflux disease without esophagitis 08/06/2016   GERD (gastroesophageal reflux disease)    Headache    MIGRAINE   History of hiatal hernia    Hypertension    Hyperthyroidism    Incomplete emptying of bladder 10/29/2013   Migraine 07/26/2013   Pneumonia due to COVID-19 virus 2020   PONV (postoperative nausea and vomiting)    nausea, does not remember vomiting.   Postherpetic neuralgia    RIGHT 7TH.CRANIAL NERVE from shingles followed by Bell's palsy   Postmenopausal 11/09/2016   PVC (premature ventricular contraction)    Scoliosis    Scoliosis (and kyphoscoliosis), idiopathic 07/26/2013   Scoliosis of lumbosacral spine 01/18/2015   Squamous cell carcinoma of skin 03/13/2013   L clavicle    Squamous cell carcinoma of skin 03/10/2018   L med knee   Squamous cell carcinoma of skin 02/21/2019   R cheek    Squamous cell carcinoma of skin 06/01/2019   L mid lat pretibial - ED&C   Trochanteric bursitis 11/29/2014   Ulcerative colitis ([place])    Urethral prolapse 08/09/2013   Urge incontinence 08/09/2013    Assessment/Plan: 1 Day Post-Op Procedure(s) (LRB): COMPUTER ASSISTED TOTAL KNEE ARTHROPLASTY (Right) Principal Problem:   Total knee replacement status  Estimated body mass index is 29.62 kg/m as  calculated from the following:   Height as of this encounter: 5' 5"$  (1.651 m).   Weight as of this encounter: 80.7 kg. Advance diet Up with therapy D/C IV fluids Discharge home with home health  DVT Prophylaxis - Lovenox, Foot Pumps, and TED hose Weight-Bearing as tolerated to right leg  Reche Dixon, PA-C Orthopaedic Surgery 05/14/2022, 7:27 AM

## 2022-05-14 NOTE — Plan of Care (Signed)

## 2022-05-14 NOTE — Discharge Summary (Signed)
Physician Discharge Summary  Subjective: 1 Day Post-Op Procedure(s) (LRB): COMPUTER ASSISTED TOTAL KNEE ARTHROPLASTY (Right) Patient reports pain as mild.   Patient seen in rounds with Dr. Marry Guan. Patient is well, and has had no acute complaints or problems Patient is ready to go home with [facility]  Physician Discharge Summary  Patient ID: April Larsen MRN: [id number] DOB/AGE: [birth year] 77 y.o.  Admit date: 05/13/2022 Discharge date: 05/14/2022  Admission Diagnoses:  Discharge Diagnoses:  Principal Problem:   Total knee replacement status   Discharged Condition: fair  Hospital Course: The patient is postop day 1 from a right total knee arthroplasty.  She is doing well since surgery.  Her pain is manageable.  Her vitals have remained stable.  The patient had her Hemovac removed this morning.  She has ambulated with physical therapy 35 feet.  She is ready to go home after completing physical therapy goals.  Treatments: surgery:    Right total knee arthroplasty using computer-assisted navigation   SURGEON:  Marciano Sequin. M.D.   ANESTHESIA: spinal   ESTIMATED BLOOD LOSS: 50 mL   FLUIDS REPLACED: 1300 mL of crystalloid   TOURNIQUET TIME: 65 minutes   DRAINS: 2 medium Hemovac drains   SOFT TISSUE RELEASES: Anterior cruciate ligament, posterior cruciate ligament, deep medial collateral ligament, patellofemoral ligament   IMPLANTS UTILIZED: DePuy Attune size 4 posterior stabilized femoral component (cemented), size 5 rotating platform tibial component (cemented), 35 mm medialized dome patella (cemented), and an 8 mm stabilized rotating platform polyethylene insert.  Discharge Exam: Blood pressure (!) 106/57, pulse 67, temperature 97.8 F (36.6 C), resp. rate 20, height 5' 5"$  (1.651 m), weight 80.7 kg, SpO2 97 %.   Disposition: Discharge disposition: 01-Home or Self Care        Allergies as of 05/14/2022       Reactions   Hydromorphone Other (See Comments)    Hallucinations   Nsaids Other (See Comments)   Patient has had part of her colon removed    Sulfa Antibiotics Rash        Medication List     TAKE these medications    acetaminophen 650 MG CR tablet Commonly known as: TYLENOL Take 650 mg by mouth 2 (two) times daily.   ascorbic Acid 500 MG Cpcr Commonly known as: VITAMIN C 500 mg once daily   atenolol 25 MG tablet Commonly known as: TENORMIN Take 0.5 tablets (12.5 mg total) by mouth daily.   budesonide 3 MG 24 hr capsule Commonly known as: ENTOCORT EC Take 3 capsules (9 mg total) by mouth daily. What changed: how much to take   CALCIUM 600+D PO Take 1 tablet by mouth 2 (two) times daily.   DULoxetine 60 MG capsule Commonly known as: CYMBALTA Take 60 mg by mouth at bedtime.   enoxaparin 40 MG/0.4ML injection Commonly known as: LOVENOX Inject 0.4 mLs (40 mg total) into the skin daily for 14 days.   furosemide 20 MG tablet Commonly known as: LASIX Take 20 mg by mouth daily as needed.   Humira-CD/UC/HS Starter 80 MG/0.8ML Pnkt Generic drug: Adalimumab Inject 2 pens (118m) on day 1 SQ and then 854mon Day 15 SQ   Humira (2 Pen) 40 MG/0.4ML Pnkt Generic drug: Adalimumab Inject 1 pen SQ every other week   magnesium oxide 400 MG tablet Commonly known as: MAG-OX Take 400 mg by mouth daily.   meclizine 25 MG tablet Commonly known as: ANTIVERT Take 1 tablet (25 mg total) by mouth 3 (three)  times daily as needed for dizziness or nausea.   mesalamine 0.375 g 24 hr capsule Commonly known as: APRISO Take 4 capsules (1.5 g total) by mouth daily.   methimazole 5 MG tablet Commonly known as: TAPAZOLE Take 1 tablet (5 mg total) by mouth 2 (two) times daily. What changed:  when to take this additional instructions   neomycin-polymyxin b-dexamethasone 3.5-10000-0.1 Susp Commonly known as: MAXITROL Place 2 drops into the left eye in the morning, at noon, and at bedtime.   oxyCODONE 5 MG immediate release  tablet Commonly known as: Oxy IR/ROXICODONE Take 1 tablet (5 mg total) by mouth every 4 (four) hours as needed for moderate pain (pain score 4-6).   pantoprazole 40 MG tablet Commonly known as: PROTONIX Take 40 mg by mouth 2 (two) times daily.   traMADol 50 MG tablet Commonly known as: ULTRAM Take 1-2 tablets (50-100 mg total) by mouth every 4 (four) hours as needed for moderate pain.               Durable Medical Equipment  (From admission, onward)           Start     Ordered   05/13/22 1341  DME Walker rolling  Once       Question:  Patient needs a walker to treat with the following condition  Answer:  Total knee replacement status   05/13/22 1340   05/13/22 1341  DME Bedside commode  Once       Comments: Patient is not able to walk the distance required to go the bathroom, or he/she is unable to safely negotiate stairs required to access the bathroom.  A 3in1 BSC will alleviate this problem  Question:  Patient needs a bedside commode to treat with the following condition  Answer:  Total knee replacement status   05/13/22 1340            Follow-up Information     Watt Climes, PA Follow up on 05/27/2022.   Specialty: Physician Assistant Why: at 10:15am Contact information: [place] [address] [phone]         Dereck Leep, MD Follow up on 06/25/2022.   Specialty: Orthopedic Surgery Why: at 2:30pm Contact information: [address] [facility] [place] [address] [phone]                 Signed: Prescott Parma, Keinan Brouillet 05/14/2022, 7:30 AM   Objective: Vital signs in last 24 hours: Temp:  [97.3 F (36.3 C)-98.4 F (36.9 C)] 97.8 F (36.6 C) (02/15 0407) Pulse Rate:  [60-77] 67 (02/15 0407) Resp:  [13-27] 20 (02/15 0407) BP: (98-137)/(56-88) 106/57 (02/15 0407) SpO2:  [95 %-100 %] 97 % (02/15 0407)  Intake/Output from previous day:  Intake/Output Summary (Last 24 hours) at 05/14/2022  0730 Last data filed at 05/14/2022 0600 Gross per 24 hour  Intake 1840.4 ml  Output 2195 ml  Net -354.6 ml    Intake/Output this shift: No intake/output data recorded.  Labs: No results for input(s): "HGB" in the last 72 hours. No results for input(s): "WBC", "RBC", "HCT", "PLT" in the last 72 hours. No results for input(s): "NA", "K", "CL", "CO2", "BUN", "CREATININE", "GLUCOSE", "CALCIUM" in the last 72 hours. No results for input(s): "LABPT", "INR" in the last 72 hours.  EXAM: General - Patient is Alert and Oriented Extremity - Neurovascular intact Sensation intact distally Dorsiflexion/Plantar flexion intact Compartment soft Incision - clean, dry, With the drainage distally on  the incision.  The Hemovac tubing was removed with no complication. Motor Function -  PF and DF intact.  Assessment/Plan: 1 Day Post-Op Procedure(s) (LRB): COMPUTER ASSISTED TOTAL KNEE ARTHROPLASTY (Right) Procedure(s) (LRB): COMPUTER ASSISTED TOTAL KNEE ARTHROPLASTY (Right) Past Medical History:  Diagnosis Date   Actinic keratosis    Anemia    LAST HGB 15.2 ON 04-22-16   Anxiety    Arthritis    Cancer ([place]) 2016   skin   Cataract (lens) fragments in eye following cataract surgery, bilateral 2013   one eye done then 4 weeks later the other eye done.   Chronic ulcerative colitis, without complications ([place]) 123XX123   Diverticulitis 2018   Esophagitis, reflux 10/04/2014   Family history of adverse reaction to anesthesia    SISTER HARD TO WAKE UP and nausea and vomiting.   Female stress incontinence 08/09/2013   Gastroesophageal reflux disease without esophagitis 08/06/2016   GERD (gastroesophageal reflux disease)    Headache    MIGRAINE   History of hiatal hernia    Hypertension    Hyperthyroidism    Incomplete emptying of bladder 10/29/2013   Migraine 07/26/2013   Pneumonia due to COVID-19 virus 2020   PONV (postoperative nausea and vomiting)    nausea, does not remember vomiting.    Postherpetic neuralgia    RIGHT 7TH.CRANIAL NERVE from shingles followed by Bell's palsy   Postmenopausal 11/09/2016   PVC (premature ventricular contraction)    Scoliosis    Scoliosis (and kyphoscoliosis), idiopathic 07/26/2013   Scoliosis of lumbosacral spine 01/18/2015   Squamous cell carcinoma of skin 03/13/2013   L clavicle    Squamous cell carcinoma of skin 03/10/2018   L med knee   Squamous cell carcinoma of skin 02/21/2019   R cheek    Squamous cell carcinoma of skin 06/01/2019   L mid lat pretibial - ED&C   Trochanteric bursitis 11/29/2014   Ulcerative colitis ([place])    Urethral prolapse 08/09/2013   Urge incontinence 08/09/2013   Principal Problem:   Total knee replacement status  Estimated body mass index is 29.62 kg/m as calculated from the following:   Height as of this encounter: 5' 5"$  (1.651 m).   Weight as of this encounter: 80.7 kg. Advance diet Up with therapy D/C IV fluids Discharge home with home health Diet - Regular diet Follow up - in 2 weeks Activity - WBAT Disposition - Home Condition Upon Discharge - Stable DVT Prophylaxis - Lovenox and TED hose  Reche Dixon, PA-C Orthopaedic Surgery 05/14/2022, 7:30 AM

## 2022-05-14 NOTE — Evaluation (Signed)
Occupational Therapy Evaluation Patient Details Name: April Larsen MRN: [id number] DOB: [birth year] Today's Date: 05/14/2022   History of Present Illness April Larsen  has presented today for surgery, with the diagnosis of PRIMARY OSTEOARTHRITIS OF RIGHT KNEE.. The patient underwent :  COMPUTER ASSISTED TOTAL KNEE ARTHROPLASTY - RNFA (Right) as a surgical intervention. WBAT to RLE.   Clinical Impression   Chart reviewed, pt greeted in chair agreeable to OT evaluation. Pt is alert an oriented x4, requires intermittent vcs for pacing, safety with use of AE. Pt has worked with therapy for back surgeries/other hospitalizations in the last few years per pt report/chart review. PTA pt is MOD I-I in ADL/IADL per pt report. Pt provided education via hand out and demo re: in polar care mgt, falls prevention strategies, home/routines modifications, DME/AE for LB bathing and dressing tasks. Pt would benefit from skilled OT services including additional instruction in dressing techniques with or without assistive devices for dressing and bathing skills to support recall and carryover prior to discharge and ultimately to maximize safety, independence, and minimize falls risk and caregiver burden. Do not currently anticipate any OT needs following this hospitalization.        Recommendations for follow up therapy are one component of a multi-disciplinary discharge planning process, led by the attending physician.  Recommendations may be updated based on patient status, additional functional criteria and insurance authorization.   Follow Up Recommendations  No OT follow up     Assistance Recommended at Discharge Intermittent Supervision/Assistance  Patient can return home with the following A little help with walking and/or transfers;A little help with bathing/dressing/bathroom;Assistance with cooking/housework;Direct supervision/assist for financial management;Direct supervision/assist for medications  management    Functional Status Assessment  Patient has had a recent decline in their functional status and demonstrates the ability to make significant improvements in function in a reasonable and predictable amount of time.  Equipment Recommendations  BSC/3in1    Recommendations for Other Services       Precautions / Restrictions Precautions Precautions: Fall Restrictions Weight Bearing Restrictions: Yes RLE Weight Bearing: Weight bearing as tolerated      Mobility Bed Mobility               General bed mobility comments: NT in recliner pre/post session    Transfers Overall transfer level: Needs assistance Equipment used: Rolling walker (2 wheels) Transfers: Sit to/from Stand Sit to Stand: Supervision           General transfer comment: intermittent vcs for technique      Balance Overall balance assessment: Needs assistance Sitting-balance support: Feet supported Sitting balance-Leahy Scale: Normal     Standing balance support: Bilateral upper extremity supported, During functional activity Standing balance-Leahy Scale: Good                             ADL either performed or assessed with clinical judgement   ADL Overall ADL's : Needs assistance/impaired Eating/Feeding: Set up;Sitting   Grooming: Supervision/safety;Standing;Oral care Grooming Details (indicate cue type and reason): with RW at sink level , intermittent vcs for pacing             Lower Body Dressing: Supervision/safety Lower Body Dressing Details (indicate cue type and reason): socks in sitting, knows how to access AE if needed Toilet Transfer: Supervision/safety;BSC/3in1 Toilet Transfer Details (indicate cue type and reason): vcs for use of bsc Toileting- Clothing Manipulation and Hygiene: Supervision/safety;Sitting/lateral lean;Sit to/from stand Toileting -  Clothing Manipulation Details (indicate cue type and reason): brief, after urinating on toilet      Functional mobility during ADLs: Supervision/safety;Rolling walker (2 wheels) (household distances to bathroom in room and back)       Vision Patient Visual Report: No change from baseline       Perception     Praxis      Pertinent Vitals/Pain Pain Assessment Pain Assessment: 0-10 Pain Score: 5  Pain Location: R knee Pain Descriptors / Indicators: Aching Pain Intervention(s): Limited activity within patient's tolerance, Monitored during session, Premedicated before session, Repositioned, Ice applied     Hand Dominance Right   Extremity/Trunk Assessment Upper Extremity Assessment Upper Extremity Assessment: Overall WFL for tasks assessed   Lower Extremity Assessment Lower Extremity Assessment: RLE deficits/detail;Defer to PT evaluation RLE Deficits / Details: s/p RTKA       Communication Communication Communication: No difficulties   Cognition Arousal/Alertness: Awake/alert Behavior During Therapy: WFL for tasks assessed/performed, Impulsive Overall Cognitive Status: Within Functional Limits for tasks assessed                                 General Comments: vcs for pacing     General Comments  Reviewed polar acre use, importance of bone foam/positioning, and importance of routine mobility. Pt states understanding and endorses confidence with [place] home    Exercises     Shoulder Instructions      Home Living Family/patient expects to be discharged to:: Private residence Living Arrangements: Children Available Help at Discharge: Family Type of Home: House Home Access: Stairs to enter Technical brewer of Steps: 3-4 Entrance Stairs-Rails: Right Home Layout: One level     Bathroom Shower/Tub: Teacher, early years/pre: Handicapped height Bathroom Accessibility: Yes   Home Equipment: Advice worker (2 wheels);Transport chair;Cane - single point;Toilet riser   Additional Comments: pt reports she has as toilet  riser with handles that can be placed onto toilet      Prior Functioning/Environment               Mobility Comments: MOD I with AD home and communtiy distances per report ADLs Comments: MOD I in ADL/IADL per pt report        OT Problem List: Decreased strength;Decreased activity tolerance;Impaired balance (sitting and/or standing);Decreased safety awareness;Decreased knowledge of use of DME or AE      OT Treatment/Interventions: Self-care/ADL training;Patient/family education;Therapeutic exercise;Balance training;Energy conservation;Therapeutic activities;DME and/or AE instruction    OT Goals(Current goals can be found in the care plan section) Acute Rehab OT Goals Patient Stated Goal: continue to get stronger OT Goal Formulation: With patient Time For Goal Achievement: 05/28/22 Potential to Achieve Goals: Good ADL Goals Pt Will Perform Grooming: with modified independence Pt Will Perform Lower Body Dressing: with modified independence;sit to/from stand Pt Will Transfer to Toilet: with modified independence Pt Will Perform Toileting - Clothing Manipulation and hygiene: with modified independence  OT Frequency: Min 2X/week    Co-evaluation              AM-PAC OT "6 Clicks" Daily Activity     Outcome Measure Help from another person eating meals?: None Help from another person taking care of personal grooming?: None Help from another person toileting, which includes using toliet, bedpan, or urinal?: None Help from another person bathing (including washing, rinsing, drying)?: A Little Help from another person to put on and taking off regular upper body  clothing?: None Help from another person to put on and taking off regular lower body clothing?: None 6 Click Score: 23   End of Session Equipment Utilized During Treatment: Rolling walker (2 wheels)  Activity Tolerance: Patient tolerated treatment well Patient left: in chair;with call bell/phone within reach  OT  Visit Diagnosis: Other abnormalities of gait and mobility (R26.89)                Time: [id number] OT Time Calculation (min): 18 min Charges:  OT General Charges $OT Visit: 1 Visit OT Evaluation $OT Eval Low Complexity: 1 Low  Shanon Payor, OTD OTR/L  05/14/22, 10:35 AM

## 2022-05-14 NOTE — TOC Progression Note (Signed)
Transition of Care [facility]) - Progression Note    Patient Details  Name: April Larsen MRN: [id number] Date of Birth: November 30, 1945  Transition of Care [facility]) CM/SW [place], RN Phone Number: 05/14/2022, 10:23 AM  Clinical Narrative:    The patient reports that she has a toilet riser but not a 3 in1, Will need the 3 in1, [employer] to deliver to the bedside   Expected Discharge Plan: [place] Barriers to Discharge: No Barriers Identified  Expected Discharge Plan and Services   Discharge Planning Services: CM Consult   Living arrangements for the past 2 months: Single Family Home Expected Discharge Date: 05/14/22               DME Arranged: 3-N-1 DME Agency: [employer] Date DME Agency Contacted: 05/14/22 Time DME Agency Contacted: [id number] Representative spoke with at DME Agency: [place]: PT [place]: [place] Date [place]: 05/13/22 Time [place]: 1154 Representative spoke with at [place]: [place]   Social Determinants of Health ([place]) Interventions SDOH Screenings   Food Insecurity: No Food Insecurity (05/13/2022)  Housing: Low Risk  (05/13/2022)  Transportation Needs: No Transportation Needs (05/13/2022)  Utilities: Not At Risk (05/13/2022)  Tobacco Use: Low Risk  (05/13/2022)    Readmission Risk Interventions     No data to display

## 2022-05-14 NOTE — Progress Notes (Signed)
Physical Therapy Treatment Patient Details Name: April Larsen MRN: [id number] DOB: Feb 28, 1946 Today's Date: 05/14/2022   History of Present Illness April Larsen  has presented today for surgery, with the diagnosis of PRIMARY OSTEOARTHRITIS OF RIGHT KNEE.. The patient underwent :  COMPUTER ASSISTED TOTAL KNEE ARTHROPLASTY - RNFA (Right) as a surgical intervention. WBAT to RLE.    PT Comments    Pt was sitting in recliner upon arrival. She had just finished performing OT session and is eager to perform PT. Pt was in recliner pre/post session. Reviewed importance of positioning/bone foam use, and routine mobility. She states understanding and endorses having assistance at home at DC. Pt demonstrated safe abilities to stand, ambulate with RW, and perform ascending/descending stairs. She performed stairs 2 x. Once with BUE support going fwd with step to pattern and once with ascending/descending using sideways method with +1 R rail. Overall pt is progressing well. AROM ~2-89 degrees. Pt is cleared form an acute PT standpoint for safe DC home. Pt is awaiting on [facility] prior to [place] home with 24/7 assistance. HHPT scheduled to start tomorrow.     Recommendations for follow up therapy are one component of a multi-disciplinary discharge planning process, led by the attending physician.  Recommendations may be updated based on patient status, additional functional criteria and insurance authorization.  Follow Up Recommendations  Home health PT     Assistance Recommended at Discharge Set up Supervision/Assistance  Patient can return home with the following A little help with walking and/or transfers;A little help with bathing/dressing/bathroom;Assistance with cooking/housework;Assist for transportation;Help with stairs or ramp for entrance   Equipment Recommendations  BSC/3in1       Precautions / Restrictions Precautions Precautions: Fall Restrictions Weight Bearing Restrictions: Yes RLE Weight  Bearing: Weight bearing as tolerated     Mobility  Bed Mobility  General bed mobility comments: In recliner pre/post session    Transfers Overall transfer level: Needs assistance Equipment used: Rolling walker (2 wheels) Transfers: Sit to/from Stand Sit to Stand: Supervision  General transfer comment: no physical assistance required to stand from recliner. vcs for increased knee flexion and handplacement. Overall demonstrated safe abilities to stand and sit with good eccentric control with stand>sit    Ambulation/Gait Ambulation/Gait assistance: Supervision Gait Distance (Feet): 150 Feet Assistive device: Rolling walker (2 wheels) Gait Pattern/deviations: Step-to pattern, Antalgic Gait velocity: dec     General Gait Details: pt demonstrated safe abilities to ambulate ~ 150 ft total. no LOB or safety concerns. Vcs for depression of shoulders due to being tense.   Stairs Stairs: Yes Stairs assistance: Supervision Stair Management: Two rails, Forwards, One rail Right, Sideways Number of Stairs: 7 General stair comments: pt demonstrated safe ability to ascend/descend 7 stairs to simulate home entry. She performed 2 x. Once going fwd and once going sideways. no safety concerns with stair performance     Balance Overall balance assessment: Needs assistance Sitting-balance support: Feet supported Sitting balance-Leahy Scale: Normal     Standing balance support: Bilateral upper extremity supported Standing balance-Leahy Scale: Good       Cognition Arousal/Alertness: Awake/alert Behavior During Therapy: WFL for tasks assessed/performed, Impulsive Overall Cognitive Status: Within Functional Limits for tasks assessed    General Comments: Pt is A and O x 4 and cooperative and pleasant. does need vcs throughout session to slow down a little and to relax some due to being tense        Exercises Total Joint Exercises Goniometric ROM: 2-89  General Comments General  comments (skin integrity, edema, etc.): Reviewed polar acre use, importance of bone foam/positioning, and importance of routine mobility. Pt states understanding and endorses confidence with April Larsen home      Pertinent Vitals/Pain Pain Assessment Pain Assessment: 0-10 Pain Score: 4  Pain Location: R knee Pain Descriptors / Indicators: Aching Pain Intervention(s): Limited activity within patient's tolerance, Premedicated before session, Monitored during session, Repositioned, Ice applied     PT Goals (current goals can now be found in the care plan section) Acute Rehab PT Goals Patient Stated Goal: go home with my son helping me Progress towards PT goals: Progressing toward goals    Frequency    BID      PT Plan Current plan remains appropriate       AM-PAC PT "6 Clicks" Mobility   Outcome Measure  Help needed turning from your back to your side while in a flat bed without using bedrails?: None Help needed moving from lying on your back to sitting on the side of a flat bed without using bedrails?: None Help needed moving to and from a bed to a chair (including a wheelchair)?: A Little Help needed standing up from a chair using your arms (e.g., wheelchair or bedside chair)?: A Little Help needed to walk in hospital room?: A Little Help needed climbing 3-5 steps with a railing? : A Little 6 Click Score: 20    End of Session   Activity Tolerance: Patient tolerated treatment well Patient left: in chair;with call bell/phone within reach;with family/visitor present Nurse Communication: Mobility status PT Visit Diagnosis: Muscle weakness (generalized) (M62.81);History of falling (Z91.81);Difficulty in walking, not elsewhere classified (R26.2);Pain Pain - Right/Left: Right Pain - part of body: Knee     Time: [id number] PT Time Calculation (min) (ACUTE ONLY): 18 min  Charges:  $Therapeutic Activity: 8-22 mins                     Julaine Fusi PTA 05/14/22, 10:18 AM

## 2022-05-15 DIAGNOSIS — R3914 Feeling of incomplete bladder emptying: Secondary | ICD-10-CM | POA: Diagnosis not present

## 2022-05-15 DIAGNOSIS — Z96651 Presence of right artificial knee joint: Secondary | ICD-10-CM | POA: Diagnosis not present

## 2022-05-15 DIAGNOSIS — I10 Essential (primary) hypertension: Secondary | ICD-10-CM | POA: Diagnosis not present

## 2022-05-15 DIAGNOSIS — I493 Ventricular premature depolarization: Secondary | ICD-10-CM | POA: Diagnosis not present

## 2022-05-15 DIAGNOSIS — M7060 Trochanteric bursitis, unspecified hip: Secondary | ICD-10-CM | POA: Diagnosis not present

## 2022-05-15 DIAGNOSIS — Z8616 Personal history of COVID-19: Secondary | ICD-10-CM | POA: Diagnosis not present

## 2022-05-15 DIAGNOSIS — Z9181 History of falling: Secondary | ICD-10-CM | POA: Diagnosis not present

## 2022-05-15 DIAGNOSIS — B0229 Other postherpetic nervous system involvement: Secondary | ICD-10-CM | POA: Diagnosis not present

## 2022-05-15 DIAGNOSIS — M4127 Other idiopathic scoliosis, lumbosacral region: Secondary | ICD-10-CM | POA: Diagnosis not present

## 2022-05-15 DIAGNOSIS — Z7901 Long term (current) use of anticoagulants: Secondary | ICD-10-CM | POA: Diagnosis not present

## 2022-05-15 DIAGNOSIS — G43909 Migraine, unspecified, not intractable, without status migrainosus: Secondary | ICD-10-CM | POA: Diagnosis not present

## 2022-05-15 DIAGNOSIS — N3941 Urge incontinence: Secondary | ICD-10-CM | POA: Diagnosis not present

## 2022-05-15 DIAGNOSIS — M1990 Unspecified osteoarthritis, unspecified site: Secondary | ICD-10-CM | POA: Diagnosis not present

## 2022-05-15 DIAGNOSIS — Z8701 Personal history of pneumonia (recurrent): Secondary | ICD-10-CM | POA: Diagnosis not present

## 2022-05-15 DIAGNOSIS — F419 Anxiety disorder, unspecified: Secondary | ICD-10-CM | POA: Diagnosis not present

## 2022-05-15 DIAGNOSIS — E039 Hypothyroidism, unspecified: Secondary | ICD-10-CM | POA: Diagnosis not present

## 2022-05-15 DIAGNOSIS — Z85828 Personal history of other malignant neoplasm of skin: Secondary | ICD-10-CM | POA: Diagnosis not present

## 2022-05-15 DIAGNOSIS — K219 Gastro-esophageal reflux disease without esophagitis: Secondary | ICD-10-CM | POA: Diagnosis not present

## 2022-05-15 DIAGNOSIS — N393 Stress incontinence (female) (male): Secondary | ICD-10-CM | POA: Diagnosis not present

## 2022-05-15 DIAGNOSIS — Z9049 Acquired absence of other specified parts of digestive tract: Secondary | ICD-10-CM | POA: Diagnosis not present

## 2022-05-15 DIAGNOSIS — D649 Anemia, unspecified: Secondary | ICD-10-CM | POA: Diagnosis not present

## 2022-05-15 DIAGNOSIS — Z471 Aftercare following joint replacement surgery: Secondary | ICD-10-CM | POA: Diagnosis not present

## 2022-05-19 NOTE — Telephone Encounter (Signed)
Called to check status on the Humira patient assistance and they state that 2 box in section 5 are missing being checked they have to have this refaxed to them for them to continue processing the patient assistance. Printed and checked the boxes and faxed back to them

## 2022-05-21 NOTE — Telephone Encounter (Signed)
Called and patient should have determination of approval by early next week according to [employer] patient assistance

## 2022-05-25 NOTE — Telephone Encounter (Signed)
They state that they are needing patient Medicare ID number to process the insurance. Provided the Medicare ID number and they state it could take another 7 to 10 days to process the application. Asked if they could mark it as urgent because we have been waiting 4 weeks now for her to get approved and we would not know this information if I did not call. They said they would Expedite the request

## 2022-05-25 NOTE — Telephone Encounter (Signed)
Medication for Humira has been approved till 03/30/2023. Informed patient of the approval

## 2022-05-26 DIAGNOSIS — Z9181 History of falling: Secondary | ICD-10-CM | POA: Diagnosis not present

## 2022-05-26 DIAGNOSIS — Z96651 Presence of right artificial knee joint: Secondary | ICD-10-CM | POA: Diagnosis not present

## 2022-05-26 DIAGNOSIS — E039 Hypothyroidism, unspecified: Secondary | ICD-10-CM | POA: Diagnosis not present

## 2022-05-26 DIAGNOSIS — Z8701 Personal history of pneumonia (recurrent): Secondary | ICD-10-CM | POA: Diagnosis not present

## 2022-05-26 DIAGNOSIS — I493 Ventricular premature depolarization: Secondary | ICD-10-CM | POA: Diagnosis not present

## 2022-05-26 DIAGNOSIS — N393 Stress incontinence (female) (male): Secondary | ICD-10-CM | POA: Diagnosis not present

## 2022-05-26 DIAGNOSIS — Z8616 Personal history of COVID-19: Secondary | ICD-10-CM | POA: Diagnosis not present

## 2022-05-26 DIAGNOSIS — M1990 Unspecified osteoarthritis, unspecified site: Secondary | ICD-10-CM | POA: Diagnosis not present

## 2022-05-26 DIAGNOSIS — Z471 Aftercare following joint replacement surgery: Secondary | ICD-10-CM | POA: Diagnosis not present

## 2022-05-26 DIAGNOSIS — B0229 Other postherpetic nervous system involvement: Secondary | ICD-10-CM | POA: Diagnosis not present

## 2022-05-26 DIAGNOSIS — M4127 Other idiopathic scoliosis, lumbosacral region: Secondary | ICD-10-CM | POA: Diagnosis not present

## 2022-05-26 DIAGNOSIS — I10 Essential (primary) hypertension: Secondary | ICD-10-CM | POA: Diagnosis not present

## 2022-05-26 DIAGNOSIS — F419 Anxiety disorder, unspecified: Secondary | ICD-10-CM | POA: Diagnosis not present

## 2022-05-26 DIAGNOSIS — N3941 Urge incontinence: Secondary | ICD-10-CM | POA: Diagnosis not present

## 2022-05-26 DIAGNOSIS — G43909 Migraine, unspecified, not intractable, without status migrainosus: Secondary | ICD-10-CM | POA: Diagnosis not present

## 2022-05-26 DIAGNOSIS — Z9049 Acquired absence of other specified parts of digestive tract: Secondary | ICD-10-CM | POA: Diagnosis not present

## 2022-05-26 DIAGNOSIS — K219 Gastro-esophageal reflux disease without esophagitis: Secondary | ICD-10-CM | POA: Diagnosis not present

## 2022-05-26 DIAGNOSIS — Z7901 Long term (current) use of anticoagulants: Secondary | ICD-10-CM | POA: Diagnosis not present

## 2022-05-26 DIAGNOSIS — M7060 Trochanteric bursitis, unspecified hip: Secondary | ICD-10-CM | POA: Diagnosis not present

## 2022-05-26 DIAGNOSIS — Z85828 Personal history of other malignant neoplasm of skin: Secondary | ICD-10-CM | POA: Diagnosis not present

## 2022-05-26 DIAGNOSIS — D649 Anemia, unspecified: Secondary | ICD-10-CM | POA: Diagnosis not present

## 2022-05-26 DIAGNOSIS — R3914 Feeling of incomplete bladder emptying: Secondary | ICD-10-CM | POA: Diagnosis not present

## 2022-05-27 DIAGNOSIS — Z96651 Presence of right artificial knee joint: Secondary | ICD-10-CM | POA: Diagnosis not present

## 2022-05-27 DIAGNOSIS — M25561 Pain in right knee: Secondary | ICD-10-CM | POA: Diagnosis not present

## 2022-05-29 DIAGNOSIS — M25561 Pain in right knee: Secondary | ICD-10-CM | POA: Diagnosis not present

## 2022-05-29 DIAGNOSIS — Z96651 Presence of right artificial knee joint: Secondary | ICD-10-CM | POA: Diagnosis not present

## 2022-06-01 DIAGNOSIS — M25561 Pain in right knee: Secondary | ICD-10-CM | POA: Diagnosis not present

## 2022-06-01 DIAGNOSIS — Z96651 Presence of right artificial knee joint: Secondary | ICD-10-CM | POA: Diagnosis not present

## 2022-06-02 ENCOUNTER — Telehealth: Payer: Self-pay

## 2022-06-02 NOTE — Telephone Encounter (Signed)
Patient is receiving the Humira in the mail on 06/12/2022 and the nurse is coming to her house to show her how to do the Humira the next week. Patient wants to know if you can talk to her about the medication some more because she is nervous about starting the medication

## 2022-06-03 DIAGNOSIS — M25561 Pain in right knee: Secondary | ICD-10-CM | POA: Diagnosis not present

## 2022-06-03 DIAGNOSIS — Z96651 Presence of right artificial knee joint: Secondary | ICD-10-CM | POA: Diagnosis not present

## 2022-06-03 NOTE — Telephone Encounter (Signed)
Called patient to address her concerns regarding starting Humira.  Discussed about the risks and benefits of Humira.  She is willing to start Humira as scheduled.  Patient was appreciative of the call  Caryl Pina  She will also need hepatitis a and B vaccine

## 2022-06-05 DIAGNOSIS — Z96651 Presence of right artificial knee joint: Secondary | ICD-10-CM | POA: Diagnosis not present

## 2022-06-05 DIAGNOSIS — M25561 Pain in right knee: Secondary | ICD-10-CM | POA: Diagnosis not present

## 2022-06-08 DIAGNOSIS — K5190 Ulcerative colitis, unspecified, without complications: Secondary | ICD-10-CM | POA: Diagnosis not present

## 2022-06-08 DIAGNOSIS — Z96651 Presence of right artificial knee joint: Secondary | ICD-10-CM | POA: Diagnosis not present

## 2022-06-08 NOTE — Telephone Encounter (Signed)
Made appointment for 06/10/2022

## 2022-06-10 ENCOUNTER — Ambulatory Visit (INDEPENDENT_AMBULATORY_CARE_PROVIDER_SITE_OTHER): Payer: PPO | Admitting: Gastroenterology

## 2022-06-10 ENCOUNTER — Telehealth: Payer: Self-pay

## 2022-06-10 DIAGNOSIS — Z96651 Presence of right artificial knee joint: Secondary | ICD-10-CM | POA: Diagnosis not present

## 2022-06-10 DIAGNOSIS — Z23 Encounter for immunization: Secondary | ICD-10-CM | POA: Diagnosis not present

## 2022-06-10 DIAGNOSIS — M25561 Pain in right knee: Secondary | ICD-10-CM | POA: Diagnosis not present

## 2022-06-10 NOTE — Telephone Encounter (Signed)
Patient verbalized understanding she will set a appointment up with the nurse and start taking the medication

## 2022-06-10 NOTE — Progress Notes (Signed)
Gave patient the First Hep A and Hep B vaccine in left deltoid. Patient tolerated the procedure with no side effects

## 2022-06-10 NOTE — Telephone Encounter (Signed)
Patient came in the office today for her nurse visit Hep A and B vaccine. She stated that she went and saw her PCP on 06/08/2022 and he told her to hold off taking the Humira for 4 more weeks at least to let her knee heal some more. She states she talk to the nurse navigation lady for Humira and she said that the medication would be good till the expiration date in the refrigerator. She told her to just let her know when she is ready for her first injection and she will come and show her how to give it to her self. She is getting the medication in the mail sometime this week.

## 2022-06-10 NOTE — Telephone Encounter (Signed)
I recommend patient to start Humira right away.  It has been 4 weeks since her knee surgery and she has been recovering well and undergoing physical therapy.  Sherri Sear, MD

## 2022-06-10 NOTE — Telephone Encounter (Signed)
Called and left a message for call back  

## 2022-06-12 DIAGNOSIS — Z96651 Presence of right artificial knee joint: Secondary | ICD-10-CM | POA: Diagnosis not present

## 2022-06-16 DIAGNOSIS — Z96651 Presence of right artificial knee joint: Secondary | ICD-10-CM | POA: Diagnosis not present

## 2022-06-16 DIAGNOSIS — M25561 Pain in right knee: Secondary | ICD-10-CM | POA: Diagnosis not present

## 2022-06-18 DIAGNOSIS — D0471 Carcinoma in situ of skin of right lower limb, including hip: Secondary | ICD-10-CM | POA: Diagnosis not present

## 2022-06-19 ENCOUNTER — Telehealth: Payer: Self-pay

## 2022-06-19 NOTE — Telephone Encounter (Signed)
April Larsen, please reschedule capsule study with EGD placement, reschedule to some other day because capsule was retained in stomach for 7 hr '  Called patient and patient states she is okay to move to Wednesday 06/24/2022.  Called Trish and she will get patient moved

## 2022-06-23 DIAGNOSIS — M25561 Pain in right knee: Secondary | ICD-10-CM | POA: Diagnosis not present

## 2022-06-23 DIAGNOSIS — Z96651 Presence of right artificial knee joint: Secondary | ICD-10-CM | POA: Diagnosis not present

## 2022-06-24 ENCOUNTER — Telehealth: Payer: Self-pay | Admitting: Gastroenterology

## 2022-06-24 ENCOUNTER — Ambulatory Visit
Admission: RE | Admit: 2022-06-24 | Discharge: 2022-06-24 | Disposition: A | Discharge status: Home / Self Care | Payer: PPO | Attending: Gastroenterology | Admitting: Gastroenterology

## 2022-06-24 ENCOUNTER — Ambulatory Visit: Payer: PPO | Admitting: Certified Registered"

## 2022-06-24 ENCOUNTER — Encounter: Payer: Self-pay | Admitting: Gastroenterology

## 2022-06-24 ENCOUNTER — Encounter
Admission: RE | Disposition: A | Discharge status: Home / Self Care | Payer: Self-pay | Source: Home / Self Care | Attending: Gastroenterology

## 2022-06-24 DIAGNOSIS — K5190 Ulcerative colitis, unspecified, without complications: Secondary | ICD-10-CM | POA: Diagnosis not present

## 2022-06-24 DIAGNOSIS — F32A Depression, unspecified: Secondary | ICD-10-CM | POA: Insufficient documentation

## 2022-06-24 DIAGNOSIS — D509 Iron deficiency anemia, unspecified: Secondary | ICD-10-CM | POA: Diagnosis not present

## 2022-06-24 DIAGNOSIS — K922 Gastrointestinal hemorrhage, unspecified: Secondary | ICD-10-CM | POA: Insufficient documentation

## 2022-06-24 DIAGNOSIS — I10 Essential (primary) hypertension: Secondary | ICD-10-CM | POA: Diagnosis not present

## 2022-06-24 DIAGNOSIS — K31819 Angiodysplasia of stomach and duodenum without bleeding: Secondary | ICD-10-CM | POA: Insufficient documentation

## 2022-06-24 DIAGNOSIS — R519 Headache, unspecified: Secondary | ICD-10-CM | POA: Diagnosis not present

## 2022-06-24 DIAGNOSIS — K279 Peptic ulcer, site unspecified, unspecified as acute or chronic, without hemorrhage or perforation: Secondary | ICD-10-CM | POA: Insufficient documentation

## 2022-06-24 DIAGNOSIS — F419 Anxiety disorder, unspecified: Secondary | ICD-10-CM | POA: Insufficient documentation

## 2022-06-24 DIAGNOSIS — K449 Diaphragmatic hernia without obstruction or gangrene: Secondary | ICD-10-CM | POA: Insufficient documentation

## 2022-06-24 DIAGNOSIS — M1990 Unspecified osteoarthritis, unspecified site: Secondary | ICD-10-CM | POA: Diagnosis not present

## 2022-06-24 DIAGNOSIS — E0590 Thyrotoxicosis, unspecified without thyrotoxic crisis or storm: Secondary | ICD-10-CM | POA: Diagnosis not present

## 2022-06-24 DIAGNOSIS — K219 Gastro-esophageal reflux disease without esophagitis: Secondary | ICD-10-CM | POA: Insufficient documentation

## 2022-06-24 DIAGNOSIS — M419 Scoliosis, unspecified: Secondary | ICD-10-CM | POA: Insufficient documentation

## 2022-06-24 DIAGNOSIS — D649 Anemia, unspecified: Secondary | ICD-10-CM | POA: Insufficient documentation

## 2022-06-24 DIAGNOSIS — K529 Noninfective gastroenteritis and colitis, unspecified: Secondary | ICD-10-CM

## 2022-06-24 HISTORY — PX: ESOPHAGOGASTRODUODENOSCOPY: SHX5428

## 2022-06-24 HISTORY — PX: GIVENS CAPSULE STUDY: SHX5432

## 2022-06-24 SURGERY — IMAGING PROCEDURE, GI TRACT, INTRALUMINAL, VIA CAPSULE
Anesthesia: General

## 2022-06-24 MED ORDER — PROPOFOL 10 MG/ML IV BOLUS
INTRAVENOUS | Status: DC | PRN
  Administered 2022-06-24: 50 mg via INTRAVENOUS
  Administered 2022-06-24: 10 mg via INTRAVENOUS
  Administered 2022-06-24: 20 mg via INTRAVENOUS
  Administered 2022-06-24: 10 mg via INTRAVENOUS

## 2022-06-24 MED ORDER — GLYCOPYRROLATE 0.2 MG/ML IJ SOLN
INTRAMUSCULAR | Status: DC | PRN
  Administered 2022-06-24: .2 mg via INTRAVENOUS

## 2022-06-24 MED ORDER — SODIUM CHLORIDE 0.9 % IV SOLN
INTRAVENOUS | Status: DC
  Administered 2022-06-24: 1000 mL via INTRAVENOUS

## 2022-06-24 MED ORDER — PROPOFOL 500 MG/50ML IV EMUL
INTRAVENOUS | Status: DC | PRN
  Administered 2022-06-24: 145 ug/kg/min via INTRAVENOUS

## 2022-06-24 MED ORDER — LIDOCAINE HCL (CARDIAC) PF 100 MG/5ML IV SOSY
PREFILLED_SYRINGE | INTRAVENOUS | Status: DC | PRN
  Administered 2022-06-24: 100 mg via INTRAVENOUS

## 2022-06-24 MED ORDER — PROPOFOL 1000 MG/100ML IV EMUL
INTRAVENOUS | Status: AC
  Filled 2022-06-24: qty 300

## 2022-06-24 NOTE — Telephone Encounter (Signed)
Pt came in office to address concerns about stomach pain after capsule study would like a call back

## 2022-06-24 NOTE — Anesthesia Procedure Notes (Signed)
Procedure Name: General with mask airway Date/Time: 06/24/2022 8:04 AM  Performed by: Kelton Pillar, CRNAPre-anesthesia Checklist: Patient identified, Emergency Drugs available, Suction available and Patient being monitored Patient Re-evaluated:Patient Re-evaluated prior to induction Oxygen Delivery Method: Simple face mask Induction Type: IV induction Placement Confirmation: positive ETCO2, CO2 detector and breath sounds checked- equal and bilateral Dental Injury: Teeth and Oropharynx as per pre-operative assessment

## 2022-06-24 NOTE — Anesthesia Postprocedure Evaluation (Signed)
Anesthesia Post Note  Patient: April Larsen  Procedure(s) Performed: Freda Munro CAPSULE STUDY ESOPHAGOGASTRODUODENOSCOPY (EGD)  Patient location during evaluation: PACU Anesthesia Type: General Level of consciousness: awake and alert Pain management: pain level controlled Vital Signs Assessment: post-procedure vital signs reviewed and stable Respiratory status: spontaneous breathing, nonlabored ventilation, respiratory function stable and patient connected to nasal cannula oxygen Cardiovascular status: blood pressure returned to baseline and stable Postop Assessment: no apparent nausea or vomiting Anesthetic complications: no   No notable events documented.   Last Vitals:  Vitals:   06/24/22 0830 06/24/22 0840  BP: 116/70 130/72  Pulse: 90 97  Resp: 15 15  Temp:    SpO2: 99% 100%    Last Pain:  Vitals:   06/24/22 0840  TempSrc:   PainSc: 0-No pain                 [name]

## 2022-06-24 NOTE — Anesthesia Preprocedure Evaluation (Signed)
Anesthesia Evaluation  Patient identified by MRN, date of birth, ID band Patient awake    Reviewed: Allergy & Precautions, NPO status , Patient's Chart, lab work & pertinent test results  History of Anesthesia Complications (+) PONV, Family history of anesthesia reaction and history of anesthetic complications  Airway Mallampati: III   Neck ROM: Full    Dental  (+) Missing, Dental Advidsory Given   Pulmonary neg pulmonary ROS   Pulmonary exam normal breath sounds clear to auscultation       Cardiovascular hypertension, (-) angina (-) Past MI and (-) Cardiac Stents Normal cardiovascular exam(-) dysrhythmias  Rhythm:Regular Rate:Normal  ECG 05/01/22:  Normal sinus rhythm Low voltage QRS Cannot rule out Anterior infarct (cited on or before 27-Jun-2021)   Neuro/Psych  Headaches PSYCHIATRIC DISORDERS Anxiety Depression    Scoliosis s/p thoracic to lumbar fusion    GI/Hepatic hiatal hernia, PUD (ulcerative colitis),GERD  ,,  Endo/Other   Hyperthyroidism   Renal/GU negative Renal ROS     Musculoskeletal  (+) Arthritis ,    Abdominal   Peds  Hematology  (+) Blood dyscrasia, anemia   Anesthesia Other Findings Past Medical History: No date: Actinic keratosis No date: Anemia     Comment:  LAST HGB 15.2 ON 04-22-16 No date: Anxiety No date: Arthritis 2016: Cancer [facility])     Comment:  skin 2013: Cataract (lens) fragments in eye following cataract surgery,  bilateral     Comment:  one eye done then 4 weeks later the other eye done. 10/29/2015: Chronic ulcerative colitis, without complications ([place]) [id number]: Diverticulitis 10/04/2014: Esophagitis, reflux No date: Family history of adverse reaction to anesthesia     Comment:  SISTER HARD TO WAKE UP and nausea and vomiting. 08/09/2013: Female stress incontinence 08/06/2016: Gastroesophageal reflux disease without esophagitis No date: GERD (gastroesophageal reflux disease) No  date: Headache     Comment:  MIGRAINE No date: History of hiatal hernia No date: Hypertension No date: Hyperthyroidism 10/29/2013: Incomplete emptying of bladder 07/26/2013: Migraine 2020: Pneumonia due to COVID-19 virus No date: PONV (postoperative nausea and vomiting)     Comment:  nausea, does not remember vomiting. No date: Postherpetic neuralgia     Comment:  RIGHT 7TH.CRANIAL NERVE from shingles followed by Bell's              palsy 11/09/2016: Postmenopausal No date: PVC (premature ventricular contraction) No date: Scoliosis 07/26/2013: Scoliosis (and kyphoscoliosis), idiopathic 01/18/2015: Scoliosis of lumbosacral spine 03/13/2013: Squamous cell carcinoma of skin     Comment:  L clavicle  03/10/2018: Squamous cell carcinoma of skin     Comment:  L med knee 02/21/2019: Squamous cell carcinoma of skin     Comment:  R cheek  06/01/2019: Squamous cell carcinoma of skin     Comment:  L mid lat pretibial - ED&C 11/29/2014: Trochanteric bursitis No date: Ulcerative colitis ([place]) 08/09/2013: Urethral prolapse 08/09/2013: Urge incontinence   Reproductive/Obstetrics                             Anesthesia Physical Anesthesia Plan  ASA: 2  Anesthesia Plan: General   Post-op Pain Management:    Induction: Intravenous  PONV Risk Score and Plan: 4 or greater and Propofol infusion and TIVA  Airway Management Planned: Natural Airway and Nasal Cannula  Additional Equipment:   Intra-op Plan:   Post-operative Plan:   Informed Consent: I have reviewed the patients History and Physical, chart, labs and discussed the  procedure including the risks, benefits and alternatives for the proposed anesthesia with the patient or authorized representative who has indicated his/her understanding and acceptance.       Plan Discussed with: CRNA  Anesthesia Plan Comments: (Plan for spinal and GA with natural airway, LMA/GETA backup.  Patient consented for  risks of anesthesia including but not limited to:  - adverse reactions to medications - damage to eyes, teeth, lips or other oral mucosa - nerve damage due to positioning  - sore throat or hoarseness - headache, bleeding, infection, nerve damage 2/2 spinal - damage to heart, brain, nerves, lungs, other parts of body or loss of life  Informed patient about role of CRNA in peri- and intra-operative care.  Patient voiced understanding.)        Anesthesia Quick Evaluation

## 2022-06-24 NOTE — H&P (Signed)
Cephas Darby, MD [address]  [place]  [place], [place] [address]  Main: [phone]  Fax: [phone] Pager: [phone]  Primary Care Physician:  Rusty Aus, MD Primary Gastroenterologist:  Dr. Cephas Darby  Pre-Procedure History & Physical: HPI:  April Larsen is a 77 y.o. female is here for an endoscopy.   Past Medical History:  Diagnosis Date   Actinic keratosis    Anemia    LAST HGB 15.2 ON 04-22-16   Anxiety    Arthritis    Cancer ([place]) 2016   skin   Cataract (lens) fragments in eye following cataract surgery, bilateral 2013   one eye done then 4 weeks later the other eye done.   Chronic ulcerative colitis, without complications ([place]) [id number]   Diverticulitis 2018   Esophagitis, reflux 10/04/2014   Family history of adverse reaction to anesthesia    SISTER HARD TO WAKE UP and nausea and vomiting.   Female stress incontinence 08/09/2013   Gastroesophageal reflux disease without esophagitis 08/06/2016   GERD (gastroesophageal reflux disease)    Headache    MIGRAINE   History of hiatal hernia    Hypertension    Hyperthyroidism    Incomplete emptying of bladder 10/29/2013   Migraine 07/26/2013   Pneumonia due to COVID-19 virus 2020   PONV (postoperative nausea and vomiting)    nausea, does not remember vomiting.   Postherpetic neuralgia    RIGHT 7TH.CRANIAL NERVE from shingles followed by Bell's palsy   Postmenopausal 11/09/2016   PVC (premature ventricular contraction)    Scoliosis    Scoliosis (and kyphoscoliosis), idiopathic 07/26/2013   Scoliosis of lumbosacral spine 01/18/2015   Squamous cell carcinoma of skin 03/13/2013   L clavicle    Squamous cell carcinoma of skin 03/10/2018   L med knee   Squamous cell carcinoma of skin 02/21/2019   R cheek    Squamous cell carcinoma of skin 06/01/2019   L mid lat pretibial - ED&C   Trochanteric bursitis 11/29/2014   Ulcerative colitis ([place])    Urethral prolapse 08/09/2013   Urge  incontinence 08/09/2013    Past Surgical History:  Procedure Laterality Date   ABDOMINAL HYSTERECTOMY     BACK SURGERY  03/12/2021   L3-L4 AND L3-S1 DECOMPRESSION   CARPAL TUNNEL RELEASE Bilateral    CATARACT EXTRACTION W/ INTRAOCULAR LENS  IMPLANT, BILATERAL     CHOLECYSTECTOMY N/A 08/21/2016   Procedure: LAPAROSCOPIC CHOLECYSTECTOMY WITH INTRAOPERATIVE CHOLANGIOGRAM;  Surgeon: Leonie Green, MD;  Location: [facility] ORS;  Service: General;  Laterality: N/A;   COLONOSCOPY  11/06/2005   COLONOSCOPY  2010   COLONOSCOPY  2013   COLONOSCOPY WITH PROPOFOL N/A 07/29/2016   Procedure: COLONOSCOPY WITH PROPOFOL;  Surgeon: Manya Silvas, MD;  Location: [facility] ENDOSCOPY;  Service: Endoscopy;  Laterality: N/A;   COLONOSCOPY WITH PROPOFOL N/A 08/22/2020   Procedure: COLONOSCOPY WITH PROPOFOL;  Surgeon: Virgel Manifold, MD;  Location: [facility] ENDOSCOPY;  Service: Endoscopy;  Laterality: N/A;   COLONOSCOPY WITH PROPOFOL N/A 01/22/2022   Procedure: COLONOSCOPY WITH PROPOFOL;  Surgeon: Lin Landsman, MD;  Location: [place];  Service: Endoscopy;  Laterality: N/A;   ESOPHAGOGASTRODUODENOSCOPY  11/06/2005   ESOPHAGOGASTRODUODENOSCOPY (EGD) WITH PROPOFOL N/A 07/29/2016   Procedure: ESOPHAGOGASTRODUODENOSCOPY (EGD) WITH PROPOFOL;  Surgeon: Manya Silvas, MD;  Location: [facility] ENDOSCOPY;  Service: Endoscopy;  Laterality: N/A;   ESOPHAGOGASTRODUODENOSCOPY (EGD) WITH PROPOFOL N/A 02/07/2020   Procedure: ESOPHAGOGASTRODUODENOSCOPY (EGD) WITH PROPOFOL;  Surgeon: Virgel Manifold, MD;  Location:  [place] ENDOSCOPY;  Service: Endoscopy;  Laterality: N/A;   ESOPHAGOGASTRODUODENOSCOPY (EGD) WITH PROPOFOL N/A 01/22/2022   Procedure: ESOPHAGOGASTRODUODENOSCOPY (EGD) WITH PROPOFOL;  Surgeon: Lin Landsman, MD;  Location: [place];  Service: Endoscopy;  Laterality: N/A;   EYE SURGERY     GIVENS CAPSULE STUDY N/A 05/07/2022   Procedure: GIVENS CAPSULE STUDY;  Surgeon: Lin Landsman, MD;  Location: [facility] ENDOSCOPY;  Service: Gastroenterology;  Laterality: N/A;   KNEE ARTHROPLASTY Right 05/13/2022   Procedure: COMPUTER ASSISTED TOTAL KNEE ARTHROPLASTY;  Surgeon: Dereck Leep, MD;  Location: [facility] ORS;  Service: Orthopedics;  Laterality: Right;   LAPAROSCOPIC LOW ANTERIOR RESECTION  11/24/2016   Procedure: LAPAROSCOPIC LOW ANTERIOR RESECTION;  Surgeon: Jules Husbands, MD;  Location: [facility] ORS;  Service: General;;   LUMBAR LAMINECTOMY  2004   L3-4   PARTIAL COLECTOMY  2018   POLYPECTOMY  01/22/2022   Procedure: POLYPECTOMY;  Surgeon: Lin Landsman, MD;  Location: [place];  Service: Endoscopy;;   POSTERIOR LAMINECTOMY / DECOMPRESSION LUMBAR SPINE  2009   L3-S1   TONSILLECTOMY     XI ROBOTIC ASSISTED PARAESOPHAGEAL HERNIA REPAIR N/A 02/27/2020   Procedure: XI ROBOTIC ASSISTED PARAESOPHAGEAL HERNIA REPAIR;  Surgeon: Jules Husbands, MD;  Location: [facility] ORS;  Service: General;  Laterality: N/A;    Prior to Admission medications   Medication Sig Start Date End Date Taking? Authorizing Provider  ascorbic Acid (VITAMIN C) 500 MG CPCR 500 mg once daily   Yes [provider]  atenolol (TENORMIN) 25 MG tablet Take 0.5 tablets (12.5 mg total) by mouth daily. 07/03/21  Yes Danford, Suann Larry, MD  Calcium Carbonate-Vitamin D (CALCIUM 600+D PO) Take 1 tablet by mouth 2 (two) times daily.   Yes [provider]  DULoxetine (CYMBALTA) 60 MG capsule Take 60 mg by mouth at bedtime.  06/17/15  Yes [provider]  furosemide (LASIX) 20 MG tablet Take 20 mg by mouth daily as needed. 08/19/21  Yes [provider]  magnesium oxide (MAG-OX) 400 MG tablet Take 400 mg by mouth daily. 04/01/21  Yes [provider]  mesalamine (APRISO) 0.375 g 24 hr capsule Take 4 capsules (1.5 g total) by mouth daily. 10/17/20  Yes Virgel Manifold, MD  methimazole (TAPAZOLE) 5 MG tablet Take 1 tablet (5 mg total) by mouth 2 (two) times  daily. Patient taking differently: Take 5 mg by mouth daily. M-W-F 5 mg T-Th-Sat-Sun 2.5 mg 07/02/21  Yes Danford, Suann Larry, MD  neomycin-polymyxin b-dexamethasone (MAXITROL) 3.5-10000-0.1 SUSP Place 2 drops into the left eye in the morning, at noon, and at bedtime.   Yes [provider]  pantoprazole (PROTONIX) 40 MG tablet Take 40 mg by mouth 2 (two) times daily. 08/11/21 08/11/22 Yes [provider]  acetaminophen (TYLENOL) 650 MG CR tablet Take 650 mg by mouth 2 (two) times daily.    [provider]  Adalimumab (HUMIRA, 2 PEN,) 40 MG/0.4ML PNKT Inject 1 pen SQ every other week 05/06/22   Lin Landsman, MD  Adalimumab (HUMIRA-CD/UC/HS STARTER) 80 MG/0.8ML PNKT Inject 2 pens (160mg ) on day 1 SQ and then 80mg  on Day 15 SQ 05/06/22   Floyd Wade, Tally Due, MD  budesonide (ENTOCORT EC) 3 MG 24 hr capsule Take 3 capsules (9 mg total) by mouth daily. Patient not taking: Reported on 06/24/2022 02/27/22   Lin Landsman, MD  enoxaparin (LOVENOX) 40 MG/0.4ML injection Inject 0.4 mLs (40 mg total) into the skin daily for 14 days.  05/14/22 05/28/22  Reche Dixon, PA-C  meclizine (ANTIVERT) 25 MG tablet Take 1 tablet (25 mg total) by mouth 3 (three) times daily as needed for dizziness or nausea. 06/23/21   Sidney Ace, MD  oxyCODONE (OXY IR/ROXICODONE) 5 MG immediate release tablet Take 1 tablet (5 mg total) by mouth every 4 (four) hours as needed for moderate pain (pain score 4-6). Patient not taking: Reported on 06/24/2022 05/14/22   Reche Dixon, PA-C  traMADol (ULTRAM) 50 MG tablet Take 1-2 tablets (50-100 mg total) by mouth every 4 (four) hours as needed for moderate pain. Patient not taking: Reported on 06/24/2022 05/14/22   Reche Dixon, PA-C    Allergies as of 05/11/2022 - Review Complete 05/06/2022  Allergen Reaction Noted   Hydromorphone Other (See Comments) 08/11/2021   Nsaids Other (See Comments) 01/05/2020   Sulfa antibiotics Rash 08/24/2015    Family History   Problem Relation Age of Onset   Breast cancer Sister 13   Breast cancer Paternal Aunt 40   Breast cancer Cousin        1st paternal cousin   Bladder Cancer Neg Hx    Prostate cancer Neg Hx    Kidney cancer Neg Hx     Social History   Socioeconomic History   Marital status: Divorced    Spouse name: Not on file   Number of children: 2   Years of education: Not on file   Highest education level: Not on file  Occupational History   Not on file  Tobacco Use   Smoking status: Never   Smokeless tobacco: Never  Vaping Use   Vaping Use: Never used  Substance and Sexual Activity   Alcohol use: No   Drug use: No   Sexual activity: Not on file  Other Topics Concern   Not on file  Social History Narrative   Son lives with her   Social Determinants of Health   Financial Resource Strain: Not on file  Food Insecurity: No Food Insecurity (05/13/2022)   Hunger Vital Sign    Worried About Running Out of Food in the Last Year: Never true    Ran Out of Food in the Last Year: Never true  Transportation Needs: No Transportation Needs (05/13/2022)   PRAPARE - Hydrologist (Medical): No    Lack of Transportation (Non-Medical): No  Physical Activity: Not on file  Stress: Not on file  Social Connections: Not on file  Intimate Partner Violence: Not At Risk (05/13/2022)   Humiliation, Afraid, Rape, and Kick questionnaire    Fear of Current or Ex-Partner: No    Emotionally Abused: No    Physically Abused: No    Sexually Abused: No    Review of Systems: See HPI, otherwise negative ROS  Physical Exam: BP 112/73   Pulse (!) 127   Temp (!) 96.7 F (35.9 C) (Temporal)   Resp 18   Ht 5\' 6"  (1.676 m)   Wt 76.4 kg   SpO2 98%   BMI 27.17 kg/m  General:   Alert,  pleasant and cooperative in NAD Head:  Normocephalic and atraumatic. Neck:  Supple; no masses or thyromegaly. Lungs:  Clear throughout to auscultation.    Heart:  Regular rate and  rhythm. Abdomen:  Soft, nontender and nondistended. Normal bowel sounds, without guarding, and without rebound.   Neurologic:  Alert and  oriented x4;  grossly normal neurologically.  Impression/Plan: AMILEY CUCCIA is here for an endoscopy to be performed  for video capsule placement  Risks, benefits, limitations, and alternatives regarding  endoscopy have been reviewed with the patient.  Questions have been answered.  All parties agreeable.   Sherri Sear, MD  06/24/2022, 7:51 AM

## 2022-06-24 NOTE — Transfer of Care (Signed)
Immediate Anesthesia Transfer of Care Note  Patient: SHANIEL SELMON  Procedure(s) Performed: Freda Munro CAPSULE STUDY ESOPHAGOGASTRODUODENOSCOPY (EGD)  Patient Location: Endoscopy Unit  Anesthesia Type:General  Level of Consciousness: drowsy and patient cooperative  Airway & Oxygen Therapy: Patient Spontanous Breathing and Patient connected to face mask oxygen  Post-op Assessment: Report given to RN and Post -op Vital signs reviewed and stable  Post vital signs: Reviewed and stable  Last Vitals:  Vitals Value Taken Time  BP 117/62 06/24/22 0820  Temp 36.2 C 06/24/22 0820  Pulse 94 06/24/22 0822  Resp 16 06/24/22 0822  SpO2 98 % 06/24/22 0822  Vitals shown include unvalidated device data.  Last Pain:  Vitals:   06/24/22 0820  TempSrc: Temporal  PainSc: Asleep      Patients Stated Pain Goal: 0 ([phone] [id number])  Complications: No notable events documented.

## 2022-06-25 ENCOUNTER — Ambulatory Visit
Admission: RE | Admit: 2022-06-25 | Discharge: 2022-06-25 | Disposition: A | Discharge status: Home / Self Care | Payer: PPO | Source: Ambulatory Visit | Attending: Gastroenterology | Admitting: Gastroenterology

## 2022-06-25 ENCOUNTER — Encounter: Payer: Self-pay | Admitting: Gastroenterology

## 2022-06-25 DIAGNOSIS — K529 Noninfective gastroenteritis and colitis, unspecified: Secondary | ICD-10-CM

## 2022-06-25 DIAGNOSIS — Z96651 Presence of right artificial knee joint: Secondary | ICD-10-CM | POA: Diagnosis not present

## 2022-06-25 DIAGNOSIS — K6389 Other specified diseases of intestine: Secondary | ICD-10-CM | POA: Diagnosis not present

## 2022-06-25 DIAGNOSIS — D509 Iron deficiency anemia, unspecified: Secondary | ICD-10-CM

## 2022-06-25 NOTE — Addendum Note (Signed)
Addended by: Ulyess Blossom L on: 06/25/2022 09:58 AM   Modules accepted: Orders

## 2022-06-25 NOTE — Telephone Encounter (Signed)
Order KUB and patient will go today

## 2022-06-25 NOTE — Telephone Encounter (Signed)
Patient states when she had the capsule study she came straight home and set in her chair. She states she fell asleep and set there till 11:30am. She then got up to go to the bathroom. She came back and set in the chair 30 minutes after she set down the machine was not on any more. She states ENDO called her this morning at 7:30 and told her that they think it did a few pictures for the provider to review. She states she can not keep having this done because she can not afford it. She would like you to review and see if you could see anything. She states she wants to know what you recommend. Informed her that you would be in ENDO on Friday but will be out of the office all next week. Informed her I would call her back as soon as I Know something.

## 2022-06-25 NOTE — Telephone Encounter (Signed)
Let's get a KUB  RV

## 2022-06-25 NOTE — Op Note (Signed)
[facility] Gastroenterology Patient Name: April Larsen Procedure Date: 06/24/2022 7:16 AM MRN: [id number] Account #: [id number] Date of Birth: [birth year] Admit Type: Outpatient Age: 77 Room: 4 Gender: Female Note Status: Finalized Instrument Name: Michaelle Birks [id number] Procedure:             Upper GI endoscopy Indications:           GI bleeding source not documented by previous EGD and                         colonoscopy Providers:             Lin Landsman MD, MD Referring MD:          Rusty Aus, MD (Referring MD) Medicines:             General Anesthesia Complications:         No immediate complications. Estimated blood loss: None Procedure:             Pre-Anesthesia Assessment:                        - Prior to the procedure, a History and Physical was                         performed, and patient medications and allergies were                         reviewed. The patient is competent. The risks and                         benefits of the procedure and the sedation options and                         risks were discussed with the patient. All questions                         were answered and informed consent was obtained.                         Patient identification and proposed procedure were                         verified by the physician, the nurse, the                         anesthesiologist, the anesthetist and the technician                         in the pre-procedure area in the procedure room in the                         endoscopy suite. Mental Status Examination: alert and                         oriented. Airway Examination: normal oropharyngeal                         airway and neck mobility. Respiratory Examination:  clear to auscultation. CV Examination: normal.                         Prophylactic Antibiotics: The patient does not require                         prophylactic antibiotics. Prior  Anticoagulants: The                         patient has taken no anticoagulant or antiplatelet                         agents. ASA Grade Assessment: II - A patient with mild                         systemic disease. After reviewing the risks and                         benefits, the patient was deemed in satisfactory                         condition to undergo the procedure. The anesthesia                         plan was to use general anesthesia. Immediately prior                         to administration of medications, the patient was                         re-assessed for adequacy to receive sedatives. The                         heart rate, respiratory rate, oxygen saturations,                         blood pressure, adequacy of pulmonary ventilation, and                         response to care were monitored throughout the                         procedure. The physical status of the patient was                         re-assessed after the procedure.                        After obtaining informed consent, the endoscope was                         passed under direct vision. Throughout the procedure,                         the patient's blood pressure, pulse, and oxygen                         saturations were monitored continuously. The Endoscope  was introduced through the mouth, and advanced to the                         second part of duodenum. The video capsule endoscopy                         was accomplished without difficulty. The patient                         tolerated the procedure well. After obtaining informed                         consent, the endoscope was passed under direct vision.                         Throughout the procedure, the patient's blood                         pressure, pulse, and oxygen saturations were monitored                         continuously. Findings:      The video capsule had previously been placed into the  stomach       endoscopically      The esophagus was normal.      The stomach was normal.      The duodenum was normal. Impression:            - Normal esophagus.                        - Normal stomach.                        - Normal duodenum. Recommendation:        - Discharge patient to home (with escort).                        - Resume previous diet today.                        - Continue present medications. Procedure Code(s):     --- Professional ---                        [phone], Gastrointestinal tract imaging, intraluminal                         (eg, capsule endoscopy), esophagus through ileum, with                         interpretation and report Diagnosis Code(s):     --- Professional ---                        K92.2, Gastrointestinal hemorrhage, unspecified CPT copyright 2022 American Medical Association. All rights reserved. The codes documented in this report are preliminary and upon coder review may  be revised to meet current compliance requirements. Dr. Ulyess Mort Lin Landsman MD, MD 06/24/2022 8:33:20 AM This report has been signed electronically. Number of Addenda: 0 Note Initiated On: 06/24/2022 7:16 AM Estimated Blood Loss:  Estimated blood loss: none.      [facility]

## 2022-06-29 DIAGNOSIS — R300 Dysuria: Secondary | ICD-10-CM | POA: Diagnosis not present

## 2022-06-30 ENCOUNTER — Telehealth: Payer: Self-pay | Admitting: Gastroenterology

## 2022-06-30 NOTE — Telephone Encounter (Signed)
Patient states she is going to call the patient assistant and get them to send a new injection to her house and tell them what happen. She states she wanted to let us know also incase they called [place]

## 2022-06-30 NOTE — Telephone Encounter (Signed)
Patient states she got her humira shot but one of them misfired and ran down her stomach and she is going to need another one. She is requesting a call back.

## 2022-07-01 DIAGNOSIS — E0580 Other thyrotoxicosis without thyrotoxic crisis or storm: Secondary | ICD-10-CM | POA: Diagnosis not present

## 2022-07-01 DIAGNOSIS — E063 Autoimmune thyroiditis: Secondary | ICD-10-CM | POA: Diagnosis not present

## 2022-07-01 DIAGNOSIS — M4147 Neuromuscular scoliosis, lumbosacral region: Secondary | ICD-10-CM | POA: Diagnosis not present

## 2022-07-01 NOTE — Telephone Encounter (Signed)
Received paper work from the pharmacy so patient could get one extra pen. Filled out and faxed back

## 2022-07-06 ENCOUNTER — Ambulatory Visit
Admission: RE | Admit: 2022-07-06 | Discharge: 2022-07-06 | Disposition: A | Discharge status: Home / Self Care | Payer: PPO | Source: Ambulatory Visit | Attending: Gastroenterology | Admitting: Gastroenterology

## 2022-07-06 ENCOUNTER — Ambulatory Visit
Admission: RE | Admit: 2022-07-06 | Discharge: 2022-07-06 | Disposition: A | Discharge status: Home / Self Care | Payer: PPO | Attending: Gastroenterology | Admitting: Gastroenterology

## 2022-07-06 ENCOUNTER — Telehealth: Payer: Self-pay

## 2022-07-06 DIAGNOSIS — K5900 Constipation, unspecified: Secondary | ICD-10-CM | POA: Insufficient documentation

## 2022-07-06 DIAGNOSIS — K5190 Ulcerative colitis, unspecified, without complications: Secondary | ICD-10-CM | POA: Diagnosis not present

## 2022-07-06 DIAGNOSIS — R739 Hyperglycemia, unspecified: Secondary | ICD-10-CM | POA: Diagnosis not present

## 2022-07-06 DIAGNOSIS — D500 Iron deficiency anemia secondary to blood loss (chronic): Secondary | ICD-10-CM | POA: Diagnosis not present

## 2022-07-06 DIAGNOSIS — R7989 Other specified abnormal findings of blood chemistry: Secondary | ICD-10-CM | POA: Diagnosis not present

## 2022-07-06 DIAGNOSIS — E538 Deficiency of other specified B group vitamins: Secondary | ICD-10-CM | POA: Diagnosis not present

## 2022-07-06 DIAGNOSIS — E782 Mixed hyperlipidemia: Secondary | ICD-10-CM | POA: Diagnosis not present

## 2022-07-06 NOTE — Telephone Encounter (Signed)
Called and left a message for call back  

## 2022-07-06 NOTE — Telephone Encounter (Signed)
Patient verbalized understanding she will have the Xray today

## 2022-07-06 NOTE — Telephone Encounter (Signed)
-----   Message from Toney Reil, MD sent at 07/06/2022  1:42 AM EDT ----- Patient did not pass the capsule, recommend repeat KUB  RV

## 2022-07-08 ENCOUNTER — Telehealth: Payer: Self-pay

## 2022-07-08 DIAGNOSIS — K5900 Constipation, unspecified: Secondary | ICD-10-CM

## 2022-07-08 NOTE — Telephone Encounter (Signed)
-----   Message from Toney Reil, MD sent at 07/07/2022  4:12 PM EDT ----- Capsule is still in her right pelvis.  Please check with patient if she is having constipation.  Also, please advise patient to take a bottle of magnesium citrate to clean out and repeat KUB in 1 week. Dx: Retained video capsule  RV

## 2022-07-08 NOTE — Telephone Encounter (Signed)
Patient states that every time she goes to the bathroom she has diarrhea. She states she is having diarrhea 5 to 6 times a day. She states she is not having any rectal bleeding. She states she stays nausea all the time. She states she will go pick up magnesium citrate and take it and then repeat the Xray next Wednesday.

## 2022-07-10 ENCOUNTER — Encounter: Payer: Self-pay | Admitting: Gastroenterology

## 2022-07-13 ENCOUNTER — Ambulatory Visit
Admission: RE | Admit: 2022-07-13 | Discharge: 2022-07-13 | Disposition: A | Discharge status: Home / Self Care | Payer: PPO | Source: Ambulatory Visit | Attending: Gastroenterology | Admitting: Gastroenterology

## 2022-07-13 ENCOUNTER — Ambulatory Visit
Admission: RE | Admit: 2022-07-13 | Discharge: 2022-07-13 | Disposition: A | Discharge status: Home / Self Care | Payer: PPO | Attending: Gastroenterology | Admitting: Gastroenterology

## 2022-07-13 DIAGNOSIS — K6389 Other specified diseases of intestine: Secondary | ICD-10-CM | POA: Diagnosis not present

## 2022-07-13 DIAGNOSIS — K5900 Constipation, unspecified: Secondary | ICD-10-CM | POA: Diagnosis not present

## 2022-07-15 ENCOUNTER — Telehealth: Payer: Self-pay | Admitting: Gastroenterology

## 2022-07-15 ENCOUNTER — Other Ambulatory Visit: Payer: Self-pay

## 2022-07-15 DIAGNOSIS — T189XXD Foreign body of alimentary tract, part unspecified, subsequent encounter: Secondary | ICD-10-CM

## 2022-07-15 MED ORDER — NA SULFATE-K SULFATE-MG SULF 17.5-3.13-1.6 GM/177ML PO SOLN: 354.0000 mL | Freq: Once | ORAL | 0 refills | Status: AC

## 2022-07-15 NOTE — Telephone Encounter (Signed)
The capsule is still in her right lower pelvis, looks like it is stuck at the lower end of the small intestine where it joins the large intestine   Unfortunately, we have to repeat colonoscopy in order to retrieve the capsule if patient is agreeable  RV

## 2022-07-15 NOTE — Telephone Encounter (Signed)
Please review the x-ray.

## 2022-07-15 NOTE — Telephone Encounter (Signed)
Patient calling to see if her results are in from her recent imaging. Requesting call back from Dr. Verdis Prime assistant Morrie Sheldon.

## 2022-07-15 NOTE — Telephone Encounter (Signed)
Schedule patient for colonoscopy on 07/23/2022 with Dr. Allegra Lai. Went over instructions and sent to [place], sent prep to the pharmacy

## 2022-07-16 ENCOUNTER — Telehealth: Payer: Self-pay

## 2022-07-16 ENCOUNTER — Ambulatory Visit
Admission: RE | Admit: 2022-07-16 | Discharge: 2022-07-16 | Disposition: A | Discharge status: Home / Self Care | Payer: PPO | Source: Ambulatory Visit | Attending: Gastroenterology | Admitting: Gastroenterology

## 2022-07-16 ENCOUNTER — Encounter: Payer: Self-pay | Admitting: Gastroenterology

## 2022-07-16 DIAGNOSIS — T189XXD Foreign body of alimentary tract, part unspecified, subsequent encounter: Secondary | ICD-10-CM

## 2022-07-16 DIAGNOSIS — K6389 Other specified diseases of intestine: Secondary | ICD-10-CM | POA: Diagnosis not present

## 2022-07-16 NOTE — Telephone Encounter (Signed)
-----   Message from Toney Reil, MD sent at 07/16/2022  2:23 PM EDT ----- CT shows capsule in the terminal ileum or cecum, it's most likely in the terminal ileum because it is showing persistently on the x rays for few weeks now   Let's goahead with colonoscopy as scheduled   RV

## 2022-07-16 NOTE — Telephone Encounter (Signed)
Called and got patient schedule for STAT  CT Scan scheduled for Outpatient imaging at 1:00pm. NPO starting now. Called patient and patient will head over to out patient imaging now and will not drink or eat anything starting now. She has only had water today.

## 2022-07-16 NOTE — Addendum Note (Signed)
Addended by: Radene Knee L on: 07/16/2022 12:57 PM   Modules accepted: Orders

## 2022-07-16 NOTE — Telephone Encounter (Signed)
April Larsen with out patient imaging called back and she is changing the orders to the right order and will get this done for the patient.

## 2022-07-16 NOTE — Telephone Encounter (Signed)
Patient verbalized understanding of results. She will do the colonoscopy as scheduled

## 2022-07-16 NOTE — Telephone Encounter (Signed)
Toney Reil, MD  Denman George, CMA it's without contrast  RV

## 2022-07-16 NOTE — Telephone Encounter (Signed)
-----   Message from Toney Reil, MD sent at 07/16/2022 11:49 AM EDT ----- Radiologist is recommending CT A/P without contrast for accurate localization prior to planned colonoscopy to know the exact location. Please schedule CT if pt is agreeable before her colonoscopy  RV

## 2022-07-16 NOTE — Telephone Encounter (Signed)
Called centralized scheduling and talk to [place] and she said the patient is arrived at out patient imaging now. He is calling out patient imaging now to let them know this information. He transferred me to outpatient imaging front desk and she transfer me to a CT scan tech. The transferred the call and no one picked up and then the phone disconnected. Called out patient imaging and they said the CT tech was with a patient but she will get a message to them and get them to call me back as soon as they are done with this patient.

## 2022-07-17 NOTE — Anesthesia Preprocedure Evaluation (Signed)
Anesthesia Evaluation  Patient identified by MRN, date of birth, ID band Patient awake    Reviewed: Allergy & Precautions, H&P , NPO status , Patient's Chart, lab work & pertinent test results  History of Anesthesia Complications (+) PONV, Family history of anesthesia reaction and history of anesthetic complications  Airway Mallampati: III  TM Distance: >3 FB Neck ROM: Full    Dental no notable dental hx.    Pulmonary neg pulmonary ROS Hx covid pneumonia   Pulmonary exam normal breath sounds clear to auscultation       Cardiovascular hypertension, Normal cardiovascular exam Rhythm:Regular Rate:Normal     Neuro/Psych  Headaches  Anxiety Depression   Dementia    GI/Hepatic Neg liver ROS, hiatal hernia, PUD,GERD  ,,Ulcerative colitis  Is nauseated today, will give zofran pre-procedure    Endo/Other  negative endocrine ROS    Renal/GU negative Renal ROS  negative genitourinary   Musculoskeletal  (+) Arthritis , Osteoarthritis,    Abdominal   Peds negative pediatric ROS (+)  Hematology  (+) Blood dyscrasia, anemia   Anesthesia Other Findings Sister is hard to awaken from anesthesia Headache  Scoliosis Ulcerative colitis  GERD (gastroesophageal reflux disease) History of hiatal hernia  Arthritis PVC (premature ventricular contraction) Iron deficiency Anemia Family history of adverse reaction to anesthesia Anxiety Postherpetic neuralgia  Migraine  Gastroesophageal reflux disease without esophagitis Esophagitis, reflux  Chronic ulcerative colitis, without complications Trochanteric bursitis  Scoliosis of lumbosacral spine Scoliosis (and kyphoscoliosis), idiopathic Urethral prolapse Urge incontinence Postmenopausal Incomplete emptying of bladder Female stress incontinence Actinic keratosis  PONV (postoperative nausea and vomiting) Cancer  Squamous cell carcinoma of  skin Diverticulitis Hyperthyroidism  Pneumonia due to COVID-19 virus Hypertension     Reproductive/Obstetrics negative OB ROS                             Anesthesia Physical Anesthesia Plan  ASA: 2  Anesthesia Plan: General   Post-op Pain Management:    Induction: Intravenous  PONV Risk Score and Plan:   Airway Management Planned: Natural Airway and Nasal Cannula  Additional Equipment:   Intra-op Plan:   Post-operative Plan:   Informed Consent: I have reviewed the patients History and Physical, chart, labs and discussed the procedure including the risks, benefits and alternatives for the proposed anesthesia with the patient or authorized representative who has indicated his/her understanding and acceptance.     Dental Advisory Given  Plan Discussed with: Anesthesiologist, CRNA and Surgeon  Anesthesia Plan Comments: (Patient consented for risks of anesthesia including but not limited to:  - adverse reactions to medications - risk of airway placement if required - damage to eyes, teeth, lips or other oral mucosa - nerve damage due to positioning  - sore throat or hoarseness - Damage to heart, brain, nerves, lungs, other parts of body or loss of life  Patient voiced understanding.)       Anesthesia Quick Evaluation

## 2022-07-19 DIAGNOSIS — D509 Iron deficiency anemia, unspecified: Secondary | ICD-10-CM

## 2022-07-20 ENCOUNTER — Other Ambulatory Visit: Payer: Self-pay

## 2022-07-20 ENCOUNTER — Encounter: Payer: Self-pay | Admitting: Gastroenterology

## 2022-07-20 ENCOUNTER — Ambulatory Visit (INDEPENDENT_AMBULATORY_CARE_PROVIDER_SITE_OTHER): Payer: PPO | Admitting: Gastroenterology

## 2022-07-20 VITALS — BP 111/75 | HR 105 | Temp 98.6°F | Ht 65.0 in | Wt 164.1 lb

## 2022-07-20 DIAGNOSIS — Z189 Retained foreign body fragments, unspecified material: Secondary | ICD-10-CM

## 2022-07-20 DIAGNOSIS — K5100 Ulcerative (chronic) pancolitis without complications: Secondary | ICD-10-CM | POA: Diagnosis not present

## 2022-07-20 DIAGNOSIS — Z23 Encounter for immunization: Secondary | ICD-10-CM

## 2022-07-20 MED ORDER — POLYETHYLENE GLYCOL 3350 17 GM/SCOOP PO POWD
1.0000 | Freq: Every day | ORAL | 0 refills | Status: DC
Start: 2022-07-20 — End: 2023-03-01

## 2022-07-20 MED ORDER — PREDNISONE 20 MG PO TABS
20.0000 mg | ORAL_TABLET | Freq: Every day | ORAL | 0 refills | Status: AC
Start: 2022-07-20 — End: 2022-08-03

## 2022-07-20 NOTE — Patient Instructions (Signed)
5:00pm Mix 64 ounces of Gatorade with 238 grams of miralax. Drink 8oz every 20 minutes till solution is gone.

## 2022-07-20 NOTE — Progress Notes (Signed)
[name], MD [address]  [address]  [place], [place] [address]  Main: [phone]  Fax: [phone]    Gastroenterology Consultation  Referring Provider:     Danella Penton, MD Primary Care Physician:  Danella Penton, MD Primary Gastroenterologist:  Dr. [name] Reason for Consultation: Ulcerative colitis        HPI:   April Larsen is a 77 y.o. female referred by Dr. Hyacinth Meeker, Hardin Negus, MD  for consultation & management of ulcerative colitis.  Patient has longstanding history of ulcerative colitis for more than 10 years, previously managed by [facility] gastroenterology.  She was on Asacol, later switched to Apriso.  Patient was previously seen by Dr. Maximino Greenland until 2022.  Her last colonoscopy from 08/22/2020 revealed chronic colitis with mild activity in the cecum and ascending colon.  Chronic colitis without activity and rest of the colon.  TI was normal.  Patient stayed on Apriso 0.375 g 4 pills daily.  Patient states that her ulcerative colitis has been in remission until she had lumbar fusion surgery in December 2022 for severe lumbar scoliosis.  Since surgery, she had 2 hospitalizations secondary to fall.  Since end of April 2023, patient reports that she has noticed flareup of her colitis symptoms including diarrhea, abdominal cramps, episodes of incontinence.  She denies any rectal bleeding.  She also developed hyperthyroidism and started on methimazole.  She has iron deficiency without anemia.  Patient is also on long term colestipol and sucralfate and not sure if these are helping.  Patient underwent hiatal hernia repair as well as sigmoid colectomy in 2018.  Patient is accompanied by her daughter today.  She reports that she has been relatively doing well with regards to her GI symptoms.  Patient limits diary intake, drinks coffee in the morning, denies consumption of carbonated beverages.  She does not have good appetite.  GI profile PCR on 12/28/2021 was  negative for infection, CRP normal.  Fecal calprotectin was not performed although patient said she dropped off the stool specimen  Follow-up visit 04/28/2022 Patient is here for follow-up of ulcerative colitis.  She underwent colonoscopy on 01/22/2022, which revealed chronic mild active colitis.  She had a flareup of ulcerative colitis after back surgery at that time which prompted colonoscopy.  Also, her fecal calprotectin levels were elevated at 145.  She received short course of prednisone followed by tried to start her on Uceris.  It was expensive.  I started her on Entocort 3 mg 3 pills daily which she has been taking since November.  Patient gained about 15 to 20 pounds.  She reports having diarrheal episodes and Entocort is not helping much.  She is taking Imodium and Pepto-Bismol as needed for diarrhea.  She has postprandial urgency, sometimes nocturnal diarrhea, denies any rectal bleeding.  She denies any abdominal pain. Patient is scheduled to undergo knee replacement on 05/13/2022 by Dr. Ernest Pine.  Follow-up visit 07/21/2022 April Larsen is here for follow-up of ulcerative colitis.  She underwent workup of iron deficiency anemia, found to have small nonbleeding AVM in the proximal small bowel.  She had retained Stool on serial abdominal x-rays and despite bowel cleanout.  She continues to have diarrheal episodes, 5-6 times daily.  She started taking Humira, finished induction doses, currently on maintenance dose. Patient is accompanied by her daughter today  She is s/p knee replacement in 04/2022.  NSAIDs: None  Antiplts/Anticoagulants/Anti thrombotics: None  GI Procedures:  EGD and  colonoscopy 01/22/2022 DIAGNOSIS: A. DUODENUM; COLD BIOPSY: - ENTERIC MUCOSA WITH PRESERVED VILLOUS ARCHITECTURE AND NO SIGNIFICANT HISTOPATHOLOGIC CHANGE. - NEGATIVE FOR FEATURES OF CELIAC, DYSPLASIA, AND MALIGNANCY.  B. STOMACH, RANDOM BIOPSY: - GASTRIC ANTRAL MUCOSA WITH FEATURES OF MILD REACTIVE  GASTROPATHY. - GASTRIC OXYNTIC MUCOSA WITH CHANGES OF PPI EFFECT. - NEGATIVE FOR H. PYLORI, DYSPLASIA, AND MALIGNANCY.  C. COLON, RIGHT; COLD BIOPSY: - CHRONIC COLITIS WITH MILD ACTIVITY (CRYPTITIS). - NEGATIVE FOR GRANULOMA, DYSPLASIA, AND MALIGNANCY.  D. COLON POLYP, DESCENDING; COLD SNARE: - POLYPOID FRAGMENTS OF BENIGN COLONIC MUCOSA WITH SUPERFICIAL REACTIVE CHANGES AND SMALL LYMPHOID AGGREGATE. - NEGATIVE FOR DYSPLASIA AND MALIGNANCY.  E. COLON, LEFT; COLD BIOPSY: - CHRONIC COLITIS WITH MILD ACTIVITY (FOCAL CRYPTITIS). - NEGATIVE FOR GRANULOMA, DYSPLASIA, AND MALIGNANCY.    Sigmoid colectomy 11/24/2016 DIAGNOSIS:  A.  SIGMOID COLON; LAPAROSCOPIC LOW ANTERIOR RESECTION:  - DIVERTICULITIS.  - SEROSAL ADHESIONS.  - REACTIVE LYMPH NODE.  - UNREMARKABLE RESECTION MARGINS.   Upper endoscopy 02/07/2020 - Normal esophagus. - 5 cm hiatal hernia. - Erythematous mucosa in the antrum. Biopsied. - A single gastric polyp. Biopsied. - Normal duodenal bulb, second portion of the duodenum and examined duodenum. - Biopsies were obtained in the gastric body, at the incisura and in the gastric antrum. DIAGNOSIS:  A. STOMACH ERYTHEMA; COLD BIOPSY:  - REACTIVE GASTROPATHY.  - NEGATIVE FOR ACTIVE INFLAMMATION AND H PYLORI.  - NEGATIVE FOR INTESTINAL METAPLASIA, DYSPLASIA, AND MALIGNANCY.   B. STOMACH POLYP; COLD BIOPSY:  - HYPERPLASTIC POLYP.  - NEGATIVE FOR DYSPLASIA AND MALIGNANCY.  Colonoscopy 08/22/2020 - Erythematous mucosa in the ascending colon and in the cecum. Biopsied. - Diverticulosis in the sigmoid colon. - Patent surgical anastomosis, characterized by erythema. - The examination was otherwise normal. - The rectum, sigmoid colon, descending colon, transverse colon and terminal ileum are normal. Biopsied. - Non-bleeding internal hemorrhoids. DIAGNOSIS:  A. TERMINAL ILEUM; COLD BIOPSY:  - ENTERIC MUCOSA WITH NORMAL VILLOUS ARCHITECTURE AND NO SIGNIFICANT  HISTOPATHOLOGIC  CHANGE.  - SINGLE FRAGMENT OF COLONIC MUCOSA WITH NO SIGNIFICANT HISTOPATHOLOGIC  CHANGE.  - NEGATIVE FOR ACTIVE ILEITIS/COLITIS.  - NEGATIVE FOR GRANULOMA, DYSPLASIA, AND MALIGNANCY.   B. COLON, CECUM AND ASCENDING; COLD BIOPSY:  - CHRONIC COLITIS WITH MILD ACTIVITY (FOCAL CRYPTITIS AND SUPERFICIAL  ACTIVE MUCOSAL INFLAMMATION).  - NEGATIVE FOR GRANULOMA, DYSPLASIA, AND MALIGNANCY.   C. COLON, TRANSVERSE AND DESCENDING; COLD BIOPSY:  - CHRONIC COLITIS WITHOUT ACTIVITY.  - NEGATIVE FOR GRANULOMA, DYSPLASIA, AND MALIGNANCY.   D. COLON, RECTOSIGMOID; COLD BIOPSY:  - CHRONIC PROCTITIS WITHOUT ACTIVITY.  - NEGATIVE FOR GRANULOMA, DYSPLASIA, AND MALIGNANCY.    Past Medical History:  Diagnosis Date   Actinic keratosis    Anemia    LAST HGB 15.2 ON 04-22-16   Anxiety    Arthritis    Cancer 2016   skin   Cataract (lens) fragments in eye following cataract surgery, bilateral 2013   one eye done then 4 weeks later the other eye done.   Chronic ulcerative colitis, without complications 10/29/2015   Diverticulitis 2018   Esophagitis, reflux 10/04/2014   Family history of adverse reaction to anesthesia    SISTER HARD TO WAKE UP and nausea and vomiting.   Female stress incontinence 08/09/2013   Gastroesophageal reflux disease without esophagitis 08/06/2016   GERD (gastroesophageal reflux disease)    Headache    MIGRAINE   History of hiatal hernia    Hypertension    Hyperthyroidism    Incomplete emptying of bladder 10/29/2013   Migraine 07/26/2013  Pneumonia due to COVID-19 virus 2020   PONV (postoperative nausea and vomiting)    nausea, does not remember vomiting.   Postherpetic neuralgia    RIGHT 7TH.CRANIAL NERVE from shingles followed by Bell's palsy   Postmenopausal 11/09/2016   PVC (premature ventricular contraction)    Scoliosis    Scoliosis (and kyphoscoliosis), idiopathic 07/26/2013   Scoliosis of lumbosacral spine 01/18/2015   Squamous cell carcinoma of skin  03/13/2013   L clavicle    Squamous cell carcinoma of skin 03/10/2018   L med knee   Squamous cell carcinoma of skin 02/21/2019   R cheek    Squamous cell carcinoma of skin 06/01/2019   L mid lat pretibial - ED&C   Trochanteric bursitis 11/29/2014   Ulcerative colitis    Urethral prolapse 08/09/2013   Urge incontinence 08/09/2013    Past Surgical History:  Procedure Laterality Date   ABDOMINAL HYSTERECTOMY     BACK SURGERY  03/12/2021   L3-L4 AND L3-S1 DECOMPRESSION   CARPAL TUNNEL RELEASE Bilateral    CATARACT EXTRACTION W/ INTRAOCULAR LENS  IMPLANT, BILATERAL     CHOLECYSTECTOMY N/A 08/21/2016   Procedure: LAPAROSCOPIC CHOLECYSTECTOMY WITH INTRAOPERATIVE CHOLANGIOGRAM;  Surgeon: Nadeen Landau, MD;  Location: [facility] ORS;  Service: General;  Laterality: N/A;   COLONOSCOPY  11/06/2005   COLONOSCOPY  2010   COLONOSCOPY  2013   COLONOSCOPY WITH PROPOFOL N/A 07/29/2016   Procedure: COLONOSCOPY WITH PROPOFOL;  Surgeon: Scot Jun, MD;  Location: [facility] ENDOSCOPY;  Service: Endoscopy;  Laterality: N/A;   COLONOSCOPY WITH PROPOFOL N/A 08/22/2020   Procedure: COLONOSCOPY WITH PROPOFOL;  Surgeon: Pasty Spillers, MD;  Location: [facility] ENDOSCOPY;  Service: Endoscopy;  Laterality: N/A;   COLONOSCOPY WITH PROPOFOL N/A 01/22/2022   Procedure: COLONOSCOPY WITH PROPOFOL;  Surgeon: Toney Reil, MD;  Location: [facility];  Service: Endoscopy;  Laterality: N/A;   ESOPHAGOGASTRODUODENOSCOPY  11/06/2005   ESOPHAGOGASTRODUODENOSCOPY N/A 06/24/2022   Procedure: ESOPHAGOGASTRODUODENOSCOPY (EGD);  Surgeon: Toney Reil, MD;  Location: [facility] ENDOSCOPY;  Service: Gastroenterology;  Laterality: N/A;   ESOPHAGOGASTRODUODENOSCOPY (EGD) WITH PROPOFOL N/A 07/29/2016   Procedure: ESOPHAGOGASTRODUODENOSCOPY (EGD) WITH PROPOFOL;  Surgeon: Scot Jun, MD;  Location: [facility] END[facility]COPY;  Service: Endoscopy;  Laterality: N/A;   ESOPHAGOGASTRODUODENOSCOPY (EGD) WITH PROPOFOL N/A  02/07/2020   Procedure: ESOPHAGOGASTRODUODENOSCOPY (EGD) WITH PROPOFOL;  Surgeon: Pasty Spillers, MD;  Location: [facility] ENDOSCOPY;  Service: Endoscopy;  Laterality: N/A;   ESOPHAGOGASTRODUODENOSCOPY (EGD) WITH PROPOFOL N/A 01/22/2022   Procedure: ESOPHAGOGASTRODUODENOSCOPY (EGD) WITH PROPOFOL;  Surgeon: Toney Reil, MD;  Location: [facility];  Service: Endoscopy;  Laterality: N/A;   EYE SURGERY     GIVENS CAPSULE STUDY N/A 05/07/2022   Procedure: GIVENS CAPSULE STUDY;  Surgeon: Toney Reil, MD;  Location: [facility] ENDOSCOPY;  Service: Gastroenterology;  Laterality: N/A;   GIVENS CAPSULE STUDY N/A 06/24/2022   Procedure: GIVENS CAPSULE STUDY;  Surgeon: Toney Reil, MD;  Location: [facility]OSCOPY;  Service: Gastroenterology;  Laterality: N/A;   KNEE ARTHROPLASTY Right 05/13/2022   Procedure: COMPUTER ASSISTED TOTAL KNEE ARTHROPLASTY;  Surgeon: Donato Heinz, MD;  Location: [facility];  Service: Orthopedics;  Laterality: Right;   LAPAROSCOPIC LOW ANTERIOR RESECTION  11/24/2016   Procedure: LAPAROSCOPIC LOW ANTERIOR RESECTION;  Surgeon: Leafy Ro, MD;  Location: [facility];  Service: General;;   LUMBAR LAMINECTOMY  2004   L3-4   PARTIAL COLECTOMY  2018   POLYPECTOMY  01/22/2022   Procedure: POLYPECTOMY;  Surgeon: Toney Reil, MD;  Location: [facility];  Service: Endoscopy;;   POSTERIOR LAMINECTOMY / DECOMPRESSION LUMBAR SPINE  2009   L3-S1   TONSILLECTOMY     XI ROBOTIC ASSISTED PARAESOPHAGEAL HERNIA REPAIR N/A 02/27/2020   Procedure: XI ROBOTIC ASSISTED PARAESOPHAGEAL HERNIA REPAIR;  Surgeon: Leafy Ro, MD;  Location: [facility];  Service: General;  Laterality: N/A;     Current Outpatient Medications:    acetaminophen (TYLENOL) 650 MG CR tablet, Take 650 mg by mouth 2 (two) times daily., Disp: , Rfl:    Adalimumab (HUMIRA, 2 PEN,) 40 MG/0.4ML PNKT, Inject 1 pen SQ every other week, Disp: 2 each, Rfl: 12   amoxicillin (AMOXIL) 500 MG  capsule, Take 2,000 mg by mouth as needed (Take before dental appts)., Disp: , Rfl:    ascorbic Acid (VITAMIN C) 500 MG CPCR, 500 mg once daily, Disp: , Rfl:    DULoxetine (CYMBALTA) 60 MG capsule, Take 60 mg by mouth at bedtime. , Disp: , Rfl: 3   furosemide (LASIX) 20 MG tablet, Take 20 mg by mouth daily as needed., Disp: , Rfl:    meclizine (ANTIVERT) 25 MG tablet, Take 1 tablet (25 mg total) by mouth 3 (three) times daily as needed for dizziness or nausea., Disp: 30 tablet, Rfl: 0   mesalamine (APRISO) 0.375 g 24 hr capsule, Take 4 capsules (1.5 g total) by mouth daily., Disp: 360 capsule, Rfl: 0   methimazole (TAPAZOLE) 5 MG tablet, Take 1 tablet (5 mg total) by mouth 2 (two) times daily. (Patient taking differently: Take 5 mg by mouth daily. M-W-F 5 mg T-Th-Sat-Sun 2.5 mg), Disp: , Rfl:    neomycin-polymyxin b-dexamethasone (MAXITROL) 3.5-10000-0.1 SUSP, Place 2 drops into the left eye in the morning, at noon, and at bedtime., Disp: , Rfl:    pantoprazole (PROTONIX) 40 MG tablet, Take 40 mg by mouth 2 (two) times daily., Disp: , Rfl:    polyethylene glycol powder (GLYCOLAX/MIRALAX) 17 GM/SCOOP powder, Take 255 g by mouth daily., Disp: 238 g, Rfl: 0   predniSONE (DELTASONE) 20 MG tablet, Take 1 tablet (20 mg total) by mouth daily with breakfast for 14 days., Disp: 14 tablet, Rfl: 0   triamterene-hydrochlorothiazide (DYAZIDE) 37.5-25 MG capsule, Take 1 capsule by mouth daily., Disp: , Rfl:    Calcium Carbonate-Vitamin D (CALCIUM 600+D PO), Take 1 tablet by mouth 2 (two) times daily. (Patient not taking: Reported on 07/20/2022), Disp: , Rfl:    ferrous gluconate (FERGON) 324 MG tablet, Take by mouth. (Patient not taking: Reported on 07/20/2022), Disp: , Rfl:    magnesium oxide (MAG-OX) 400 MG tablet, Take 400 mg by mouth daily. (Patient not taking: Reported on 07/20/2022), Disp: , Rfl:    Family History  Problem Relation Age of Onset   Breast cancer Sister 50   Breast cancer Paternal Aunt 63    Breast cancer Cousin        1st paternal cousin   Bladder Cancer Neg Hx    Prostate cancer Neg Hx    Kidney cancer Neg Hx      Social History   Tobacco Use   Smoking status: Never   Smokeless tobacco: Never  Vaping Use   Vaping Use: Never used  Substance Use Topics   Alcohol use: No   Drug use: No    Allergies as of 07/20/2022 - Review Complete 07/20/2022  Allergen Reaction Noted   Hydromorphone Other (See Comments) 08/11/2021   Nsaids Other (See Comments) 01/05/2020   Sulfa antibiotics Rash 08/24/2015  Review of Systems:    All systems reviewed and negative except where noted in HPI.   Physical Exam:  BP 111/75 (BP Location: Left Arm, Patient Position: Sitting, Cuff Size: Normal)   Pulse (!) 105   Temp 98.6 F (37 C) (Oral)   Ht  (1.651 m)   Wt 164 lb 2 oz (74.4 kg)   BMI 27.31 kg/m  No LMP recorded. Patient has had a hysterectomy.  General:   Alert,  Well-developed, well-nourished, pleasant and cooperative in NAD Head:  Normocephalic and atraumatic. Eyes:  Sclera clear, no icterus.   Conjunctiva pink. Ears:  Normal auditory acuity. Nose:  No deformity, discharge, or lesions. Mouth:  No deformity or lesions,oropharynx pink & moist. Neck:  Supple; no masses or thyromegaly. Lungs:  Respirations even and unlabored.  Clear throughout to auscultation.   No wheezes, crackles, or rhonchi. No acute distress. Heart:  Regular rate and rhythm; no murmurs, clicks, rubs, or gallops. Abdomen:  Normal bowel sounds. Soft, non-tender and non-distended without masses, hepatosplenomegaly or hernias noted.  No guarding or rebound tenderness.   Rectal: Not performed Msk:  Symmetrical without gross deformities. Good, equal movement & strength bilaterally. Pulses:  Normal pulses noted. Extremities:  No clubbing or edema.  No cyanosis. Neurologic:  Alert and oriented x3;  grossly normal neurologically. Skin:  Intact without significant lesions or rashes. No jaundice. Psych:   Alert and cooperative. Normal mood and affect.  Imaging Studies: No recent abdominal imaging  Assessment and Plan:   April Larsen is a 77 y.o. female with longstanding history of ulcerative colitis, maintained on mesalamine, lumbar scoliosis s/p lumbar fusion in December 2022, large hiatal hernia s/p repair, sigmoid diverticulitis s/p sigmoid colectomy in 2018 is seen in consultation for ulcerative pan colitis, also with history of iron deficiency anemia  Ulcerative colitis: Not in remission since her back surgery in mid 2023 Elevated fecal calprotectin levels Stool studies negative for infection including C. difficile Colonoscopy revealed chronic mild active pan colitis Initiated Humira on 06/30/2022, completed induction therapy, currently on maintenance dose biweekly Given ongoing diarrhea, recommend short course of prednisone, will start with 20 mg daily and see if it helps She is no longer on budesonide She is up-to-date with vaccinations, due for third dose of Twinrix vaccine in 11/2022  Iron deficiency anemia: resolved EGD and colonoscopy did not reveal source of anemia First video capsule endoscopy, capsule did not leave stomach.  Subsequently, underwent endoscopic placement of video capsule on 06/24/2022.  Revealed a solitary small nonbleeding AVM in the proximal small bowel.  Capsule did not reach cecum.  X-ray KUB revealed retained capsule in the right pelvis, most likely in the terminal ileum.  Patient is agreeable to undergo colonoscopy for retrieval of video capsule.  If colonoscopy is not successful, she will need surgical retrieval of video capsule   Follow up in 3 to 4 months   [name], MD

## 2022-07-21 ENCOUNTER — Encounter: Payer: Self-pay | Admitting: Gastroenterology

## 2022-07-23 ENCOUNTER — Ambulatory Visit: Payer: PPO | Admitting: Anesthesiology

## 2022-07-23 ENCOUNTER — Other Ambulatory Visit: Payer: Self-pay

## 2022-07-23 ENCOUNTER — Ambulatory Visit
Admission: RE | Admit: 2022-07-23 | Discharge: 2022-07-23 | Disposition: A | Discharge status: Home / Self Care | Payer: PPO | Attending: Gastroenterology | Admitting: Gastroenterology

## 2022-07-23 ENCOUNTER — Encounter: Payer: Self-pay | Admitting: Gastroenterology

## 2022-07-23 ENCOUNTER — Ambulatory Visit
Admission: RE | Admit: 2022-07-23 | Discharge: 2022-07-23 | Disposition: A | Discharge status: Home / Self Care | Payer: PPO | Source: Ambulatory Visit | Attending: Gastroenterology | Admitting: Gastroenterology

## 2022-07-23 ENCOUNTER — Ambulatory Visit
Admission: RE | Admit: 2022-07-23 | Discharge: 2022-07-23 | Disposition: A | Discharge status: Home / Self Care | Payer: PPO | Source: Home / Self Care | Attending: Gastroenterology | Admitting: Gastroenterology

## 2022-07-23 ENCOUNTER — Encounter
Admission: RE | Disposition: A | Discharge status: Home / Self Care | Payer: Self-pay | Source: Home / Self Care | Attending: Gastroenterology

## 2022-07-23 DIAGNOSIS — Z189 Retained foreign body fragments, unspecified material: Secondary | ICD-10-CM | POA: Insufficient documentation

## 2022-07-23 DIAGNOSIS — K644 Residual hemorrhoidal skin tags: Secondary | ICD-10-CM | POA: Insufficient documentation

## 2022-07-23 DIAGNOSIS — W448XXA Other foreign body entering into or through a natural orifice, initial encounter: Secondary | ICD-10-CM | POA: Insufficient documentation

## 2022-07-23 DIAGNOSIS — I10 Essential (primary) hypertension: Secondary | ICD-10-CM | POA: Diagnosis not present

## 2022-07-23 DIAGNOSIS — D759 Disease of blood and blood-forming organs, unspecified: Secondary | ICD-10-CM | POA: Diagnosis not present

## 2022-07-23 DIAGNOSIS — Z8711 Personal history of peptic ulcer disease: Secondary | ICD-10-CM | POA: Diagnosis not present

## 2022-07-23 DIAGNOSIS — K5190 Ulcerative colitis, unspecified, without complications: Secondary | ICD-10-CM | POA: Diagnosis not present

## 2022-07-23 DIAGNOSIS — K219 Gastro-esophageal reflux disease without esophagitis: Secondary | ICD-10-CM | POA: Insufficient documentation

## 2022-07-23 DIAGNOSIS — K635 Polyp of colon: Secondary | ICD-10-CM | POA: Diagnosis not present

## 2022-07-23 DIAGNOSIS — T189XXA Foreign body of alimentary tract, part unspecified, initial encounter: Secondary | ICD-10-CM

## 2022-07-23 DIAGNOSIS — K449 Diaphragmatic hernia without obstruction or gangrene: Secondary | ICD-10-CM | POA: Diagnosis not present

## 2022-07-23 DIAGNOSIS — T189XXD Foreign body of alimentary tract, part unspecified, subsequent encounter: Secondary | ICD-10-CM

## 2022-07-23 DIAGNOSIS — D649 Anemia, unspecified: Secondary | ICD-10-CM | POA: Diagnosis not present

## 2022-07-23 DIAGNOSIS — Z980 Intestinal bypass and anastomosis status: Secondary | ICD-10-CM | POA: Diagnosis not present

## 2022-07-23 DIAGNOSIS — Z9889 Other specified postprocedural states: Secondary | ICD-10-CM | POA: Diagnosis not present

## 2022-07-23 HISTORY — PX: COLONOSCOPY WITH PROPOFOL: SHX5780

## 2022-07-23 HISTORY — DX: Foreign body of alimentary tract, part unspecified, initial encounter: T18.9XXA

## 2022-07-23 SURGERY — COLONOSCOPY WITH PROPOFOL
Anesthesia: General | Site: Rectum

## 2022-07-23 MED ORDER — LIDOCAINE HCL (CARDIAC) PF 100 MG/5ML IV SOSY
PREFILLED_SYRINGE | INTRAVENOUS | Status: DC | PRN
  Administered 2022-07-23: 70 mg via INTRAVENOUS

## 2022-07-23 MED ORDER — PROPOFOL 10 MG/ML IV BOLUS
INTRAVENOUS | Status: DC | PRN
  Administered 2022-07-23 (×2): 20 mg via INTRAVENOUS
  Administered 2022-07-23: 70 mg via INTRAVENOUS
  Administered 2022-07-23: 30 mg via INTRAVENOUS
  Administered 2022-07-23 (×2): 20 mg via INTRAVENOUS

## 2022-07-23 MED ORDER — LACTATED RINGERS IV SOLN: INTRAVENOUS | Status: DC

## 2022-07-23 MED ORDER — STERILE WATER FOR IRRIGATION IR SOLN
Status: DC | PRN
  Administered 2022-07-23: 100 mL

## 2022-07-23 MED ORDER — ONDANSETRON HCL 4 MG/2ML IJ SOLN
4.0000 mg | Freq: Once | INTRAMUSCULAR | Status: AC
  Administered 2022-07-23: 4 mg via INTRAVENOUS

## 2022-07-23 MED ORDER — SODIUM CHLORIDE 0.9 % IV SOLN: INTRAVENOUS | Status: DC

## 2022-07-23 SURGICAL SUPPLY — 6 items
GOWN CVR UNV OPN BCK APRN NK (MISCELLANEOUS) ×2 IMPLANT
GOWN ISOL THUMB LOOP REG UNIV (MISCELLANEOUS) ×2
KIT PRC NS LF DISP ENDO (KITS) ×1 IMPLANT
KIT PROCEDURE OLYMPUS (KITS) ×1
MANIFOLD NEPTUNE II (INSTRUMENTS) ×1 IMPLANT
WATER STERILE IRR 250ML POUR (IV SOLUTION) ×1 IMPLANT

## 2022-07-23 NOTE — H&P (Signed)
[name], MD [address]  [address]  [place], [place] [address]  Main: [phone]  Fax: [phone] Pager: [phone]  Primary Care Physician:  Danella Penton, MD Primary Gastroenterologist:  Dr. [name]  Pre-Procedure History & Physical: HPI:  April Larsen is a 77 y.o. female is here for an colonoscopy.   Past Medical History:  Diagnosis Date   Actinic keratosis    Anemia    LAST HGB 15.2 ON 04-22-16   Anxiety    Arthritis    Cancer 2016   skin   Cataract (lens) fragments in eye following cataract surgery, bilateral 2013   one eye done then 4 weeks later the other eye done.   Chronic ulcerative colitis, without complications 10/29/2015   Diverticulitis 2018   Esophagitis, reflux 10/04/2014   Family history of adverse reaction to anesthesia    SISTER HARD TO WAKE UP and nausea and vomiting.   Female stress incontinence 08/09/2013   Gastroesophageal reflux disease without esophagitis 08/06/2016   GERD (gastroesophageal reflux disease)    Headache    MIGRAINE   History of hiatal hernia    Hypertension    Hyperthyroidism    Incomplete emptying of bladder 10/29/2013   Migraine 07/26/2013   Pneumonia due to COVID-19 virus 2020   PONV (postoperative nausea and vomiting)    nausea, does not remember vomiting.   Postherpetic neuralgia    RIGHT 7TH.CRANIAL NERVE from shingles followed by Bell's palsy   Postmenopausal 11/09/2016   PVC (premature ventricular contraction)    Scoliosis    Scoliosis (and kyphoscoliosis), idiopathic 07/26/2013   Scoliosis of lumbosacral spine 01/18/2015   Squamous cell carcinoma of skin 03/13/2013   L clavicle    Squamous cell carcinoma of skin 03/10/2018   L med knee   Squamous cell carcinoma of skin 02/21/2019   R cheek    Squamous cell carcinoma of skin 06/01/2019   L mid lat pretibial - ED&C   Trochanteric bursitis 11/29/2014   Ulcerative colitis    Urethral prolapse 08/09/2013   Urge incontinence  08/09/2013    Past Surgical History:  Procedure Laterality Date   ABDOMINAL HYSTERECTOMY     BACK SURGERY  03/12/2021   L3-L4 AND L3-S1 DECOMPRESSION   CARPAL TUNNEL RELEASE Bilateral    CATARACT EXTRACTION W/ INTRAOCULAR LENS  IMPLANT, BILATERAL     CHOLECYSTECTOMY N/A 08/21/2016   Procedure: LAPAROSCOPIC CHOLECYSTECTOMY WITH INTRAOPERATIVE CHOLANGIOGRAM;  Surgeon: Nadeen Landau, MD;  Location: [facility] ORS;  Service: General;  Laterality: N/A;   COLONOSCOPY  11/06/2005   COLONOSCOPY  2010   COLONOSCOPY  2013   COLONOSCOPY WITH PROPOFOL N/A 07/29/2016   Procedure: COLONOSCOPY WITH PROPOFOL;  Surgeon: Scot Jun, MD;  Location: [facility] ENDOSCOPY;  Service: Endoscopy;  Laterality: N/A;   COLONOSCOPY WITH PROPOFOL N/A 08/22/2020   Procedure: COLONOSCOPY WITH PROPOFOL;  Surgeon: Pasty Spillers, MD;  Location: [facility] ENDOSCOPY;  Service: Endoscopy;  Laterality: N/A;   COLONOSCOPY WITH PROPOFOL N/A 01/22/2022   Procedure: COLONOSCOPY WITH PROPOFOL;  Surgeon: Toney Reil, MD;  Location: [facility] SURGERY CNTR;  Service: Endoscopy;  Laterality: N/A;   ESOPHAGOGASTRODUODENOSCOPY  11/06/2005   ESOPHAGOGASTRODUODENOSCOPY N/A 06/24/2022   Procedure: ESOPHAGOGASTRODUODENOSCOPY (EGD);  Surgeon: Toney Reil, MD;  Location: [facility] ENDOSCOPY;  Service: Gastroenterology;  Laterality: N/A;   ESOPHAGOGASTRODUODENOSCOPY (EGD) WITH PROPOFOL N/A 07/29/2016   Procedure: ESOPHAGOGASTRODUODENOSCOPY (EGD) WITH PROPOFOL;  Surgeon: Scot Jun, MD;  Location: [facility] ENDOSCOPY;  Service: Endoscopy;  Laterality: N/A;  ESOPHAGOGASTRODUODENOSCOPY (EGD) WITH PROPOFOL N/A 02/07/2020   Procedure: ESOPHAGOGASTRODUODENOSCOPY (EGD) WITH PROPOFOL;  Surgeon: Pasty Spillers, MD;  Location: [facility] ENDOSCOPY;  Service: Endoscopy;  Laterality: N/A;   ESOPHAGOGASTRODUODENOSCOPY (EGD) WITH PROPOFOL N/A 01/22/2022   Procedure: ESOPHAGOGASTRODUODENOSCOPY (EGD) WITH PROPOFOL;  Surgeon: Toney Reil,  MD;  Location: [facility] SURGERY CNTR;  Service: Endoscopy;  Laterality: N/A;   EYE SURGERY     GIVENS CAPSULE STUDY N/A 05/07/2022   Procedure: GIVENS CAPSULE STUDY;  Surgeon: Toney Reil, MD;  Location: [facility] ENDOSCOPY;  Service: Gastroenterology;  Laterality: N/A;   GIVENS CAPSULE STUDY N/A 06/24/2022   Procedure: GIVENS CAPSULE STUDY;  Surgeon: Toney Reil, MD;  Location: [facility] ENDOSCOPY;  Service: Gastroenterology;  Laterality: N/A;   KNEE ARTHROPLASTY Right 05/13/2022   Procedure: COMPUTER ASSISTED TOTAL KNEE ARTHROPLASTY;  Surgeon: Donato Heinz, MD;  Location: [facility] ORS;  Service: Orthopedics;  Laterality: Right;   LAPAROSCOPIC LOW ANTERIOR RESECTION  11/24/2016   Procedure: LAPAROSCOPIC LOW ANTERIOR RESECTION;  Surgeon: Leafy Ro, MD;  Location: [facility] ORS;  Service: General;;   LUMBAR LAMINECTOMY  2004   L3-4   PARTIAL COLECTOMY  2018   POLYPECTOMY  01/22/2022   Procedure: POLYPECTOMY;  Surgeon: Toney Reil, MD;  Location: [facility] SURGERY CNTR;  Service: Endoscopy;;   POSTERIOR LAMINECTOMY / DECOMPRESSION LUMBAR SPINE  2009   L3-S1   TONSILLECTOMY     XI ROBOTIC ASSISTED PARAESOPHAGEAL HERNIA REPAIR N/A 02/27/2020   Procedure: XI ROBOTIC ASSISTED PARAESOPHAGEAL HERNIA REPAIR;  Surgeon: Leafy Ro, MD;  Location: [facility] ORS;  Service: General;  Laterality: N/A;    Prior to Admission medications   Medication Sig Start Date End Date Taking? Authorizing Provider  acetaminophen (TYLENOL) 650 MG CR tablet Take 650 mg by mouth 2 (two) times daily.   Yes [provider]  Adalimumab (HUMIRA, 2 PEN,) 40 MG/0.4ML PNKT Inject 1 pen SQ every other week 05/06/22  Yes Pasha Gadison, Loel Dubonnet, MD  amoxicillin (AMOXIL) 500 MG capsule Take 2,000 mg by mouth as needed (Take before dental appts).   Yes [provider]  ascorbic Acid (VITAMIN C) 500 MG CPCR 500 mg once daily   Yes [provider]  Calcium Carbonate-Vitamin D (CALCIUM 600+D PO) Take 1  tablet by mouth 2 (two) times daily. Patient not taking: Reported on 07/20/2022   Yes [provider]  DULoxetine (CYMBALTA) 60 MG capsule Take 60 mg by mouth at bedtime.  06/17/15  Yes [provider]  furosemide (LASIX) 20 MG tablet Take 20 mg by mouth daily as needed. 08/19/21  Yes [provider]  magnesium oxide (MAG-OX) 400 MG tablet Take 400 mg by mouth daily. Patient not taking: Reported on 07/20/2022 04/01/21  Yes [provider]  meclizine (ANTIVERT) 25 MG tablet Take 1 tablet (25 mg total) by mouth 3 (three) times daily as needed for dizziness or nausea. 06/23/21  Yes Sreenath, Sudheer B, MD  mesalamine (APRISO) 0.375 g 24 hr capsule Take 4 capsules (1.5 g total) by mouth daily. 10/17/20  Yes Pasty Spillers, MD  methimazole (TAPAZOLE) 5 MG tablet Take 1 tablet (5 mg total) by mouth 2 (two) times daily. Patient taking differently: Take 5 mg by mouth daily. M-W-F 5 mg T-Th-Sat-Sun 2.5 mg 07/02/21  Yes Danford, Earl Lites, MD  neomycin-polymyxin b-dexamethasone (MAXITROL) 3.5-10000-0.1 SUSP Place 2 drops into the left eye in the morning, at noon, and at bedtime.   Yes [provider]  pantoprazole (PROTONIX) 40 MG tablet  Take 40 mg by mouth 2 (two) times daily. 08/11/21 08/11/22 Yes [provider]  predniSONE (DELTASONE) 20 MG tablet Take 1 tablet (20 mg total) by mouth daily with breakfast for 14 days. 07/20/22 08/03/22 Yes Kaydence Baba, Loel Dubonnet, MD  triamterene-hydrochlorothiazide (DYAZIDE) 37.5-25 MG capsule Take 1 capsule by mouth daily.   Yes [provider]  ferrous gluconate (FERGON) 324 MG tablet Take by mouth. Patient not taking: Reported on 07/20/2022 07/06/22 07/06/23  [provider]  polyethylene glycol powder (GLYCOLAX/MIRALAX) 17 GM/SCOOP powder Take 255 g by mouth daily. 07/20/22   Toney Reil, MD    Allergies as of 07/15/2022 - Review Complete 06/24/2022  Allergen Reaction Noted   Hydromorphone Other  (See Comments) 08/11/2021   Nsaids Other (See Comments) 01/05/2020   Sulfa antibiotics Rash 08/24/2015    Family History  Problem Relation Age of Onset   Breast cancer Sister 6   Breast cancer Paternal Aunt 24   Breast cancer Cousin        1st paternal cousin   Bladder Cancer Neg Hx    Prostate cancer Neg Hx    Kidney cancer Neg Hx     Social History   Socioeconomic History   Marital status: Divorced    Spouse name: Not on file   Number of children: 2   Years of education: Not on file   Highest education level: Not on file  Occupational History   Not on file  Tobacco Use   Smoking status: Never   Smokeless tobacco: Never  Vaping Use   Vaping Use: Never used  Substance and Sexual Activity   Alcohol use: No   Drug use: No   Sexual activity: Not on file  Other Topics Concern   Not on file  Social History Narrative   Son lives with her   Social Determinants of Health   Financial Resource Strain: Not on file  Food Insecurity: No Food Insecurity (05/13/2022)   Hunger Vital Sign    Worried About Running Out of Food in the Last Year: Never true    Ran Out of Food in the Last Year: Never true  Transportation Needs: No Transportation Needs (05/13/2022)   PRAPARE - Administrator, Civil Service (Medical): No    Lack of Transportation (Non-Medical): No  Physical Activity: Not on file  Stress: Not on file  Social Connections: Not on file  Intimate Partner Violence: Not At Risk (05/13/2022)   Humiliation, Afraid, Rape, and Kick questionnaire    Fear of Current or Ex-Partner: No    Emotionally Abused: No    Physically Abused: No    Sexually Abused: No    Review of Systems: See HPI, otherwise negative ROS  Physical Exam: BP 135/76   Pulse (!) 103   Temp 97.8 F (36.6 C)   Ht  (1.676 m)   Wt 72.6 kg   SpO2 99%   BMI 26.63 kg/m  General:   Alert,  pleasant and cooperative in NAD Head:  Normocephalic and atraumatic. Neck:  Supple; no masses or  thyromegaly. Lungs:  Clear throughout to auscultation.    Heart:  Regular rate and rhythm. Abdomen:  Soft, nontender and nondistended. Normal bowel sounds, without guarding, and without rebound.   Neurologic:  Alert and  oriented x4;  grossly normal neurologically.  Impression/Plan: April Larsen is here for an colonoscopy to be performed for retained capsule   Risks, benefits, limitations, and alternatives regarding  colonoscopy have been reviewed  with the patient.  Questions have been answered.  All parties agreeable.   Lannette Donath, MD  07/23/2022, 7:37 AM

## 2022-07-23 NOTE — Progress Notes (Signed)
Per Dr. Allegra Lai colonoscopy showed no capsule. She wants to order a xray to make sure it is not missed. Order abdominal xray

## 2022-07-23 NOTE — Transfer of Care (Signed)
Immediate Anesthesia Transfer of Care Note  Patient: April Larsen  Procedure(s) Performed: COLONOSCOPY WITH PROPOFOL (Rectum)  Patient Location: PACU  Anesthesia Type: General  Level of Consciousness: awake, alert  and patient cooperative  Airway and Oxygen Therapy: Patient Spontanous Breathing and Patient connected to supplemental oxygen  Post-op Assessment: Post-op Vital signs reviewed, Patient's Cardiovascular Status Stable, Respiratory Function Stable, Patent Airway and No signs of Nausea or vomiting  Post-op Vital Signs: Reviewed and stable  Complications: No notable events documented.

## 2022-07-23 NOTE — Anesthesia Postprocedure Evaluation (Signed)
Anesthesia Post Note  Patient: April Larsen  Procedure(s) Performed: COLONOSCOPY WITH PROPOFOL (Rectum)  Patient location during evaluation: PACU Anesthesia Type: General Level of consciousness: awake and alert Pain management: pain level controlled Vital Signs Assessment: post-procedure vital signs reviewed and stable Respiratory status: spontaneous breathing, nonlabored ventilation, respiratory function stable and patient connected to nasal cannula oxygen Cardiovascular status: blood pressure returned to baseline and stable Postop Assessment: no apparent nausea or vomiting Anesthetic complications: no   No notable events documented.   Last Vitals:  Vitals:   07/23/22 0808 07/23/22 0818  BP: (!) 96/54 108/64  Pulse: 79 81  Resp: 18 16  Temp: 36.5 C   SpO2: 98% 99%    Last Pain:  Vitals:   07/23/22 0818  PainSc: 0-No pain                 [name] C Afton Lavalle

## 2022-07-23 NOTE — Op Note (Signed)
[facility] Gastroenterology Patient Name: Alisen Marsiglia Procedure Date: 07/23/2022 7:17 AM MRN: [id number] Account #: [id number] Date of Birth: July 08, 1945 Admit Type: Outpatient Age: 77 Room: [facility] OR ROOM 01 Gender: Female Note Status: Finalized Instrument Name: [id number] Procedure:             Colonoscopy Indications:           Retained video capsule Providers:             Toney Reil MD, MD Referring MD:          Danella Penton, MD (Referring MD) Medicines:             General Anesthesia Complications:         No immediate complications. Estimated blood loss: None. Procedure:             Pre-Anesthesia Assessment:                        - Prior to the procedure, a History and Physical was                         performed, and patient medications and allergies were                         reviewed. The patient is competent. The risks and                         benefits of the procedure and the sedation options and                         risks were discussed with the patient. All questions                         were answered and informed consent was obtained.                         Patient identification and proposed procedure were                         verified by the physician, the nurse, the                         anesthesiologist, the anesthetist and the technician                         in the pre-procedure area in the procedure room in the                         endoscopy suite. Mental Status Examination: alert and                         oriented. Airway Examination: normal oropharyngeal                         airway and neck mobility. Respiratory Examination:                         clear to auscultation. CV Examination: normal.  Prophylactic Antibiotics: The patient does not require                         prophylactic antibiotics. Prior Anticoagulants: The                         patient has taken no anticoagulant  or antiplatelet                         agents. ASA Grade Assessment: II - A patient with mild                         systemic disease. After reviewing the risks and                         benefits, the patient was deemed in satisfactory                         condition to undergo the procedure. The anesthesia                         plan was to use general anesthesia. Immediately prior                         to administration of medications, the patient was                         re-assessed for adequacy to receive sedatives. The                         heart rate, respiratory rate, oxygen saturations,                         blood pressure, adequacy of pulmonary ventilation, and                         response to care were monitored throughout the                         procedure. The physical status of the patient was                         re-assessed after the procedure.                        After obtaining informed consent, the colonoscope was                         passed under direct vision. Throughout the procedure,                         the patient's blood pressure, pulse, and oxygen                         saturations were monitored continuously. The                         Colonoscope was introduced through the anus and  advanced to the 20 cm into the ileum. The colonoscopy                         was performed without difficulty. The patient                         tolerated the procedure well. The quality of the bowel                         preparation was fair. The terminal ileum, ileocecal                         valve, appendiceal orifice, and rectum were                         photographed. Findings:      The perianal and digital rectal examinations were normal. Pertinent       negatives include normal sphincter tone and no palpable rectal lesions.      The terminal ileum appeared normal. No retained capsule, examined to       20cm  from IC valve      There was evidence of a prior end-to-side colo-colonic anastomosis in       the sigmoid colon. This was patent and was characterized by healthy       appearing mucosa. The anastomosis was traversed.      Non-bleeding external hemorrhoids were found during retroflexion. The       hemorrhoids were medium-sized. Impression:            - Preparation of the colon was fair.                        - The examined portion of the ileum was normal.                        - Patent end-to-side colo-colonic anastomosis,                         characterized by healthy appearing mucosa.                        - Non-bleeding external hemorrhoids.                        - No specimens collected. Recommendation:        - Discharge patient to home (with escort).                        - Resume previous diet today.                        - Continue present medications.                        - Perform a flat plate abdominal x-ray today. Procedure Code(s):     --- Professional ---                        [id number], Colonoscopy, flexible; diagnostic, including  collection of specimen(s) by brushing or washing, when                         performed (separate procedure) Diagnosis Code(s):     --- Professional ---                        K64.4, Residual hemorrhoidal skin tags                        Z98.0, Intestinal bypass and anastomosis status CPT copyright 2022 American Medical Association. All rights reserved. The codes documented in this report are preliminary and upon coder review may  be revised to meet current compliance requirements. Dr. Libby Maw Toney Reil MD, MD 07/23/2022 8:09:35 AM This report has been signed electronically. Number of Addenda: 0 Note Initiated On: 07/23/2022 7:17 AM Scope Withdrawal Time: 0 hours 12 minutes 30 seconds  Total Procedure Duration: 0 hours 15 minutes 6 seconds  Estimated Blood Loss:  Estimated blood loss: none.       [facility]

## 2022-07-24 ENCOUNTER — Telehealth: Payer: Self-pay | Admitting: Gastroenterology

## 2022-07-24 ENCOUNTER — Encounter: Payer: Self-pay | Admitting: Gastroenterology

## 2022-07-24 NOTE — Telephone Encounter (Signed)
Patient verbalized understanding of results  

## 2022-07-24 NOTE — Telephone Encounter (Signed)
Official read is not back yet, but there is no capsule anymore, she passed it after bowel prep   RV

## 2022-07-24 NOTE — Telephone Encounter (Signed)
Pt left message to get results of foreign body xray please return call

## 2022-08-05 DIAGNOSIS — E538 Deficiency of other specified B group vitamins: Secondary | ICD-10-CM | POA: Diagnosis not present

## 2022-08-05 DIAGNOSIS — D500 Iron deficiency anemia secondary to blood loss (chronic): Secondary | ICD-10-CM | POA: Diagnosis not present

## 2022-08-05 DIAGNOSIS — E0590 Thyrotoxicosis, unspecified without thyrotoxic crisis or storm: Secondary | ICD-10-CM | POA: Diagnosis not present

## 2022-08-10 ENCOUNTER — Telehealth: Payer: Self-pay

## 2022-08-10 NOTE — Telephone Encounter (Signed)
Patient verbalized understanding of results. Patient states she will the Xray done in 1 month

## 2022-08-10 NOTE — Telephone Encounter (Signed)
-----   Message from Toney Reil, MD sent at 08/10/2022  9:42 AM EDT ----- After reviewing images with the radiologist, the video capsule you will is in the cecum which is beginning of large intestine.  I would not worry about getting it removed by doing another colonoscopy because it is not leading to any blockage.  Recommend to get x-ray KUB in 1 month  RV

## 2022-08-28 DIAGNOSIS — D2262 Melanocytic nevi of left upper limb, including shoulder: Secondary | ICD-10-CM | POA: Diagnosis not present

## 2022-08-28 DIAGNOSIS — D0472 Carcinoma in situ of skin of left lower limb, including hip: Secondary | ICD-10-CM | POA: Diagnosis not present

## 2022-08-28 DIAGNOSIS — L570 Actinic keratosis: Secondary | ICD-10-CM | POA: Diagnosis not present

## 2022-08-28 DIAGNOSIS — C44722 Squamous cell carcinoma of skin of right lower limb, including hip: Secondary | ICD-10-CM | POA: Diagnosis not present

## 2022-08-28 DIAGNOSIS — D2261 Melanocytic nevi of right upper limb, including shoulder: Secondary | ICD-10-CM | POA: Diagnosis not present

## 2022-08-28 DIAGNOSIS — D2272 Melanocytic nevi of left lower limb, including hip: Secondary | ICD-10-CM | POA: Diagnosis not present

## 2022-08-28 DIAGNOSIS — D485 Neoplasm of uncertain behavior of skin: Secondary | ICD-10-CM | POA: Diagnosis not present

## 2022-08-28 DIAGNOSIS — Z85828 Personal history of other malignant neoplasm of skin: Secondary | ICD-10-CM | POA: Diagnosis not present

## 2022-08-31 DIAGNOSIS — M79641 Pain in right hand: Secondary | ICD-10-CM | POA: Diagnosis not present

## 2022-08-31 DIAGNOSIS — M65342 Trigger finger, left ring finger: Secondary | ICD-10-CM | POA: Diagnosis not present

## 2022-08-31 DIAGNOSIS — M19042 Primary osteoarthritis, left hand: Secondary | ICD-10-CM | POA: Diagnosis not present

## 2022-08-31 DIAGNOSIS — M79642 Pain in left hand: Secondary | ICD-10-CM | POA: Diagnosis not present

## 2022-09-02 ENCOUNTER — Telehealth: Payer: Self-pay | Admitting: Gastroenterology

## 2022-09-02 NOTE — Telephone Encounter (Signed)
Pt called with question in ref to [facility] please return call

## 2022-09-02 NOTE — Telephone Encounter (Signed)
Patient states that she was getting a letters out of her cabinet and saw that her April Cruel PA was expired in May. Informed patient she was getting medication free through patient assistance and it did not get billed to her insurance. She said okay she is is sorry she just worries about everything

## 2022-09-10 ENCOUNTER — Telehealth: Payer: Self-pay

## 2022-09-10 DIAGNOSIS — Z189 Retained foreign body fragments, unspecified material: Secondary | ICD-10-CM

## 2022-09-10 NOTE — Telephone Encounter (Signed)
Pt returning your call to discuss KUB results.

## 2022-09-10 NOTE — Telephone Encounter (Signed)
Patient verbalized understanding of instructions and states she will go this week or next week.

## 2022-09-10 NOTE — Telephone Encounter (Signed)
-----   Message from Denman George, CMA sent at 08/10/2022 10:01 AM EDT ----- Recommend to get x-ray KUB in 1 month

## 2022-09-10 NOTE — Telephone Encounter (Signed)
Order Xray and called and left  a message for patient

## 2022-09-11 ENCOUNTER — Ambulatory Visit
Admission: RE | Admit: 2022-09-11 | Discharge: 2022-09-11 | Disposition: A | Discharge status: Home / Self Care | Payer: PPO | Source: Ambulatory Visit | Attending: Gastroenterology | Admitting: Gastroenterology

## 2022-09-11 DIAGNOSIS — Z189 Retained foreign body fragments, unspecified material: Secondary | ICD-10-CM

## 2022-09-11 DIAGNOSIS — Z0389 Encounter for observation for other suspected diseases and conditions ruled out: Secondary | ICD-10-CM | POA: Diagnosis not present

## 2022-09-15 ENCOUNTER — Telehealth: Payer: Self-pay | Admitting: Gastroenterology

## 2022-09-15 DIAGNOSIS — K529 Noninfective gastroenteritis and colitis, unspecified: Secondary | ICD-10-CM

## 2022-09-15 NOTE — Addendum Note (Signed)
Addended by: Radene Knee L on: 09/15/2022 10:13 AM   Modules accepted: Orders

## 2022-09-15 NOTE — Telephone Encounter (Signed)
Recommend GI profile PCR and C Diff toxin A & B  RV

## 2022-09-15 NOTE — Telephone Encounter (Signed)
Patient states that her symptoms began on Thursday June 13. She states that she took her Humira on Tuesday June 11. She states she always takes the medication on Tuesday and the first week her symptoms are good not really any diarrhea but the second week she begins to have diarrhea more till her next shot. She states that Thursday she began to have diarrhea 4 to 5 times a day any time she eats or drinks anything. She states her son that lives with her did have a stomach bug but she has just had diarrhea not sick on her stomach. She has LLQ abdominal pain. She states she is taking Imodium 2 tablets in the morning and 1 tablet 3 times a day. Please advise what you recommend

## 2022-09-15 NOTE — Telephone Encounter (Signed)
Pt left message stating that her medication humira injection is causing severe diarrhea for the last week she would like a call back

## 2022-09-15 NOTE — Telephone Encounter (Signed)
Order the stool test and patient states she will come pick up the kit today

## 2022-09-17 LAB — GI PROFILE, STOOL, PCR

## 2022-09-18 ENCOUNTER — Telehealth: Payer: Self-pay

## 2022-09-18 DIAGNOSIS — K5100 Ulcerative (chronic) pancolitis without complications: Secondary | ICD-10-CM

## 2022-09-18 LAB — GI PROFILE, STOOL, PCR
Campylobacter: NOT DETECTED
Cryptosporidium: NOT DETECTED
Enteropathogenic E coli: NOT DETECTED
Rotavirus A: NOT DETECTED
Sapovirus: NOT DETECTED
Yersinia enterocolitica: NOT DETECTED

## 2022-09-18 LAB — C DIFFICILE, CYTOTOXIN B

## 2022-09-18 NOTE — Telephone Encounter (Signed)
-----   Message from Toney Reil, MD sent at 09/18/2022 10:16 AM EDT ----- She has finally passed the capsule  RV

## 2022-09-18 NOTE — Telephone Encounter (Signed)
-----   Message from Toney Reil, MD sent at 09/18/2022 11:50 AM EDT ----- Stool studies came back negative for infection.  Please check if her diarrhea has resolved I do not want to add any prednisone unless it is absolutely necessary  RV

## 2022-09-18 NOTE — Telephone Encounter (Signed)
Patient states she is still having diarrhea but not as bad. She states she is taking Imodium and that helps the diarrhea. She states that she is trying to not really eat because when she eat she will diarrhea. She states sometimes she does but some times she does. She states it does not matter what she eats. She can not pinpoint what cause it. She states yesterday she went 3 times but was not a lot of substance. She states she don't understand what is causing this. She states the first week of shots are usually okay and then the 2nd week before the next shot she will have more diarrhea

## 2022-09-18 NOTE — Telephone Encounter (Signed)
Called and left a message for call back  

## 2022-09-18 NOTE — Telephone Encounter (Signed)
Patient verbalized understanding of results  

## 2022-09-19 LAB — GI PROFILE, STOOL, PCR
Adenovirus F 40/41: NOT DETECTED
C difficile toxin A/B: NOT DETECTED
Entamoeba histolytica: NOT DETECTED
Giardia lamblia: NOT DETECTED
Plesiomonas shigelloides: NOT DETECTED
Salmonella: NOT DETECTED
Shiga-toxin-producing E coli: NOT DETECTED
Vibrio cholerae: NOT DETECTED

## 2022-09-19 LAB — C DIFFICILE, CYTOTOXIN B

## 2022-09-19 LAB — C DIFFICILE TOXINS A+B W/RFLX: C difficile Toxins A+B, EIA: NEGATIVE

## 2022-09-21 IMAGING — MR MR HEAD W/O CM
12 series · 45 of 48 positions shown · non-contrast
Comparison: Head CT 9982 hours today and earlier.

CLINICAL DATA: 75-year-old female status post fall. Ataxia and
vomiting. Headache.

EXAM:
MRI HEAD WITHOUT CONTRAST
TECHNIQUE: Multiplanar, multiecho pulse sequences of the brain and surrounding
structures were obtained without intravenous contrast.

[Series 5: ax dwi_tracew · axial · 3.0mm · 0.65mm/px · z∈[-107,+46]mm · 4 of 48 slices shown]
[im 1/48]
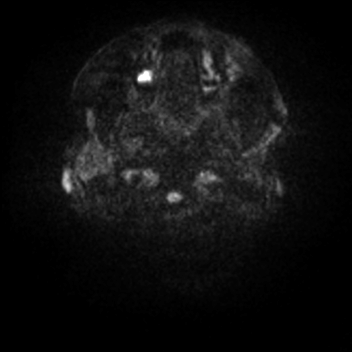
[im 16/48]
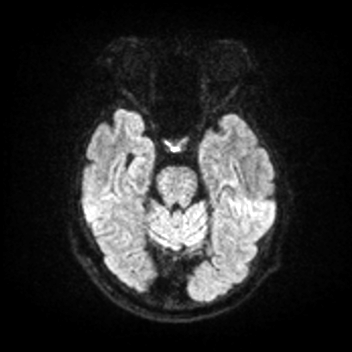
[im 32/48]
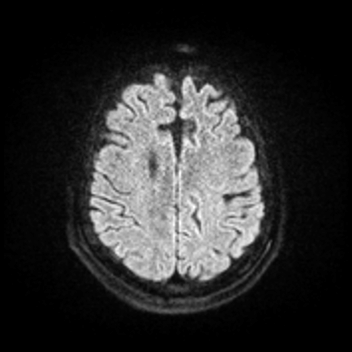
[im 48/48]
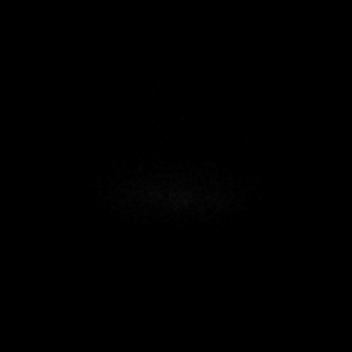

[Series 6: ax dwi_adc · axial · 3.0mm · 0.65mm/px · z∈[-107,+43]mm · 4 of 47 slices shown]
[im 1/47]
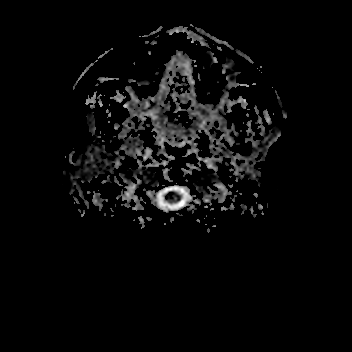
[im 16/47]
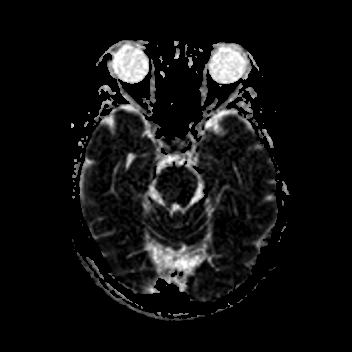
[im 31/47]
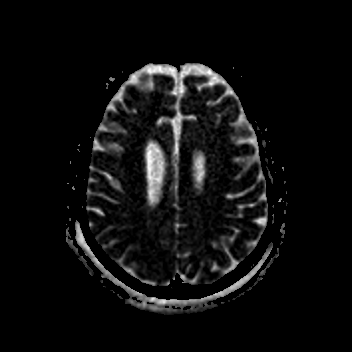
[im 47/47]
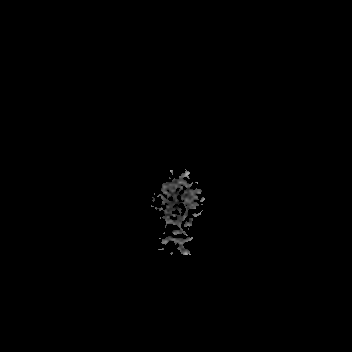

[Series 7: cor dwi_tracew · coronal · 5.0mm · 0.60mm/px · 3 of 36 slices shown]
[im 1/36]
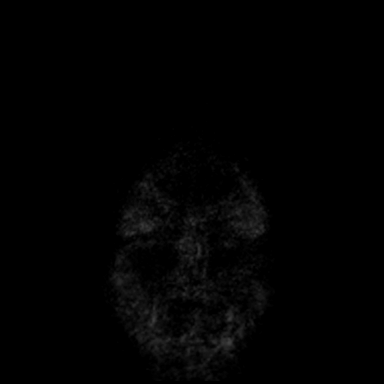
[im 18/36]
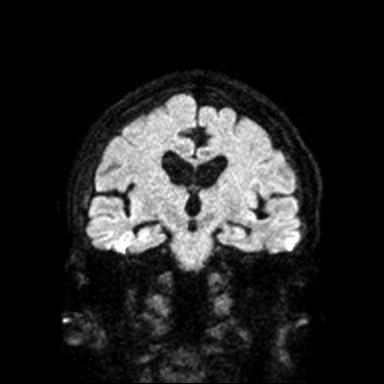
[im 36/36]
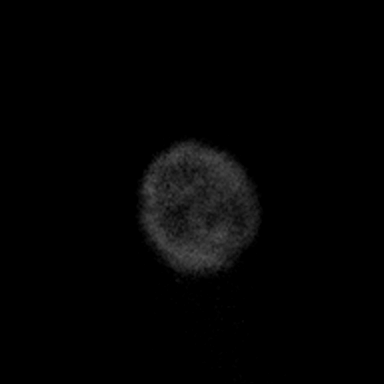

[Series 8: cor dwi_adc · coronal · 5.0mm · 0.60mm/px · 3 of 36 slices shown]
[im 1/36]
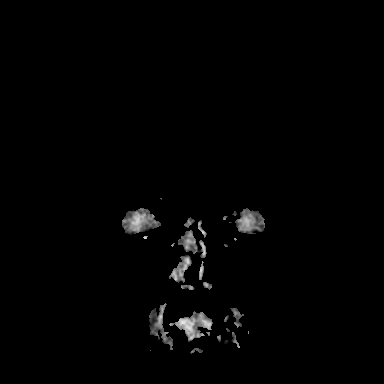
[im 18/36]
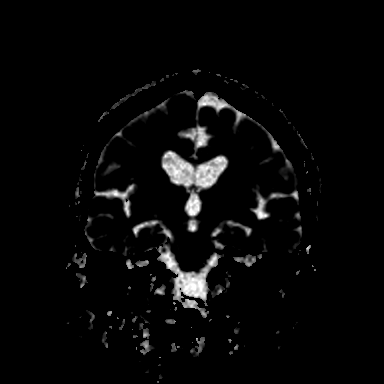
[im 36/36]
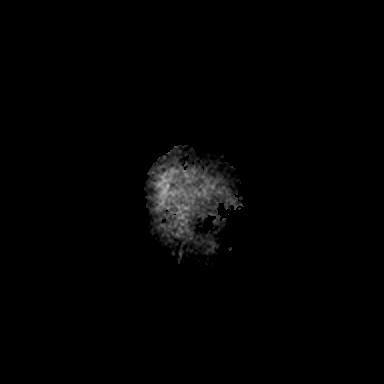

[Series 9: T1 · sagittal · 5.0mm · 0.62mm/px · 2 of 22 slices shown (1 of 2)]
[im 1/22]
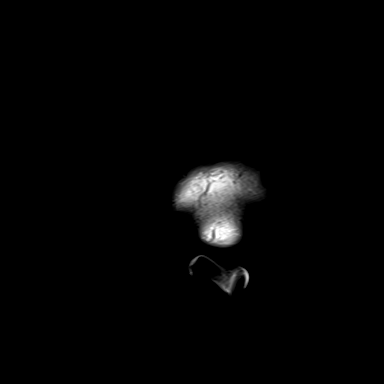
[im 22/22]
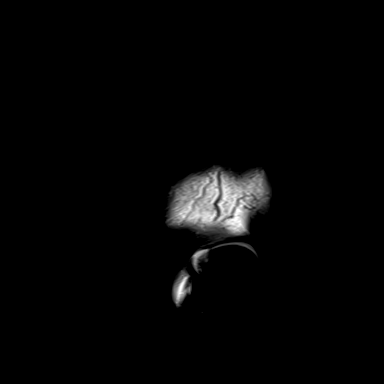

[Series 10: T2 · axial · 5.0mm · 0.53mm/px · z∈[-99,+37]mm · 2 of 24 slices shown (1 of 2)]
[im 1/24]
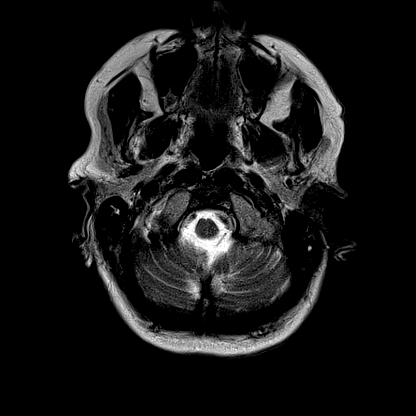
[im 24/24]
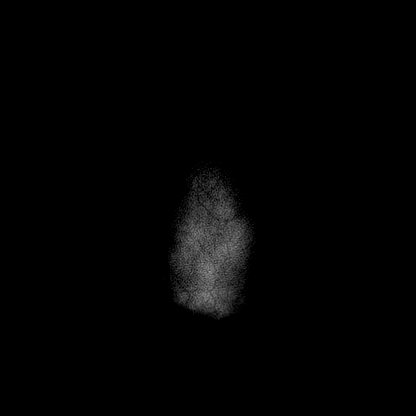

[Series 11: mag_images · axial · 3.0mm · 0.90mm/px · z∈[-114,+50]mm · 4 of 56 slices shown]
[im 1/56]
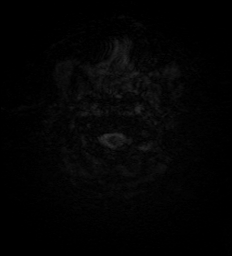
[im 19/56]
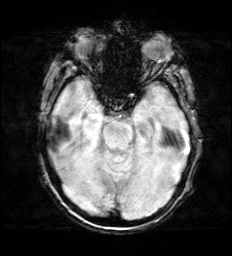
[im 37/56]
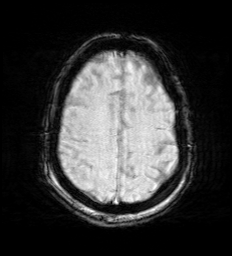
[im 56/56]
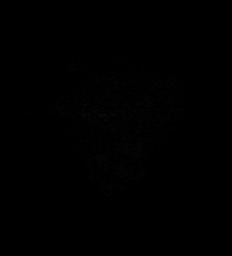

[Series 12: pha_images · axial · 3.0mm · 0.90mm/px · z∈[-114,+50]mm · 4 of 55 slices shown]
[im 1/55]
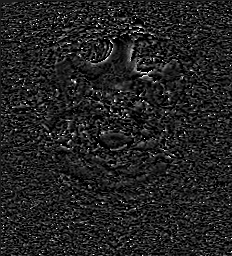
[im 19/55]
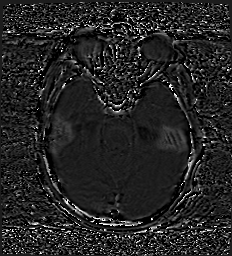
[im 37/55]
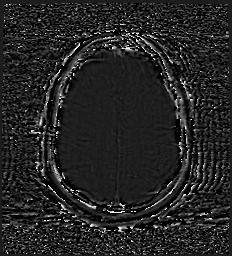
[im 55/55]
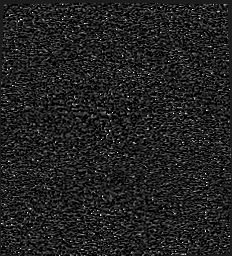

[Series 13: swi_images · axial · 3.0mm · 0.90mm/px · z∈[-114,+50]mm · 4 of 56 slices shown]
[im 1/56]
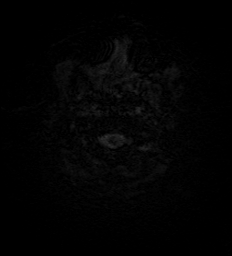
[im 19/56]
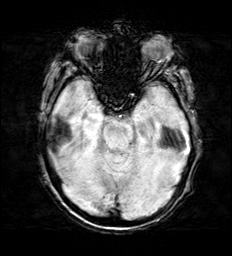
[im 37/56]
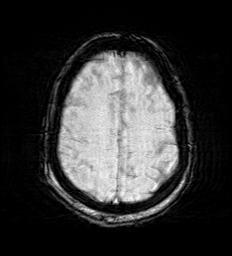
[im 56/56]
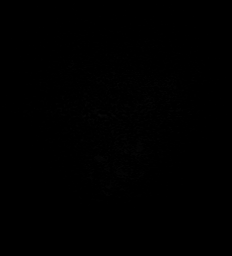

[Series 15: FLAIR · axial · 3.0mm · 0.53mm/px · z∈[-104,+42]mm · 3 of 42 slices shown]
[im 1/42]
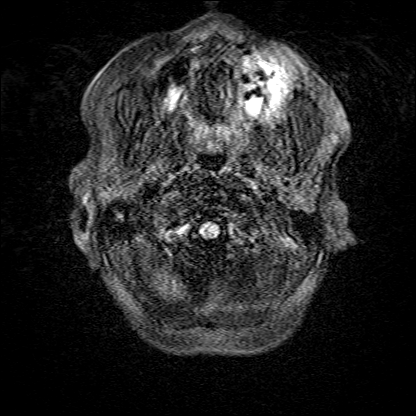
[im 21/42]
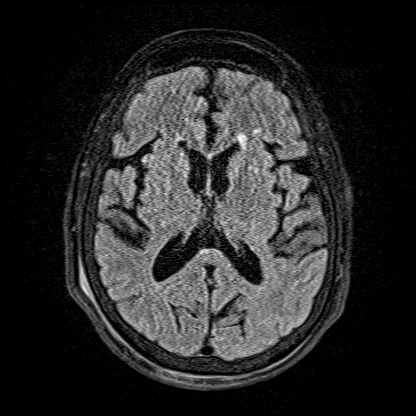
[im 42/42]
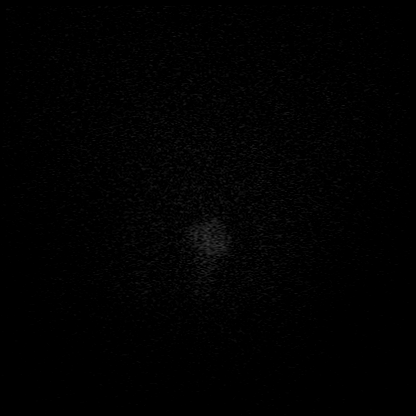

[Series 16: T1 · axial · 1.0mm · 0.98mm/px · z∈[-111,+47]mm · 10 of 160 slices shown (2 of 2)]
[im 1/160]
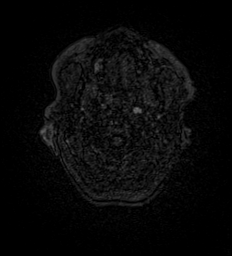
[im 14/160]
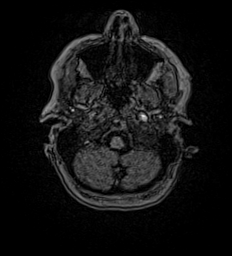
[im 27/160]
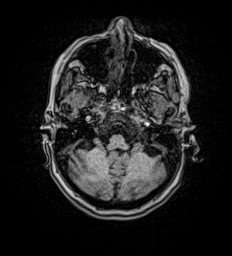
[im 40/160]
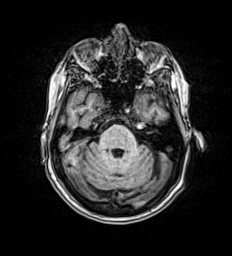
[im 54/160]
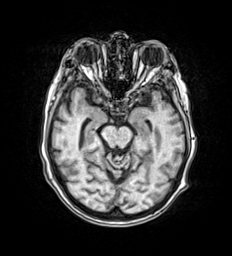
[im 67/160]
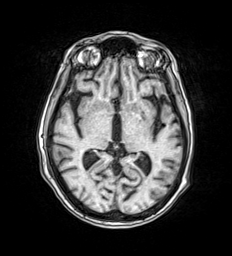
[im 93/160]
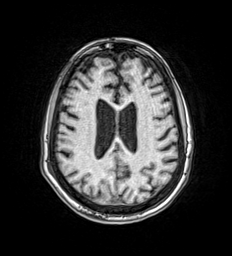
[im 107/160]
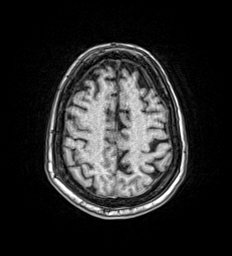
[im 133/160]
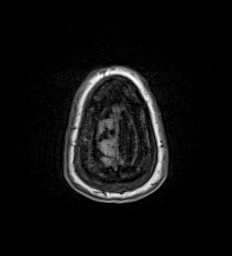
[im 160/160]
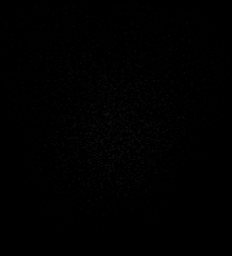

[Series 17: T2 · coronal · 5.0mm · 0.57mm/px · 2 of 28 slices shown (2 of 2)]
[im 1/28]
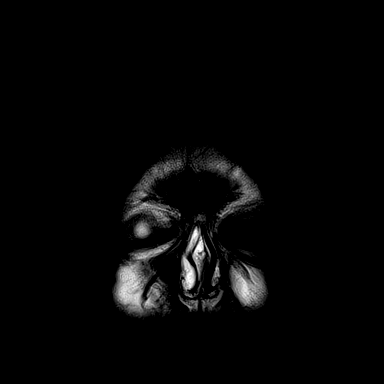
[im 28/28]
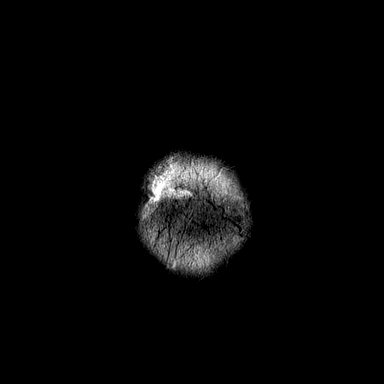

[45 of 48 positions shown; findings below may reference images not displayed]

FINDINGS: Brain: No restricted diffusion to suggest acute infarction. No
midline shift, mass effect, evidence of mass lesion,
ventriculomegaly, extra-axial collection or acute intracranial
hemorrhage. Cervicomedullary junction and pituitary are within
normal limits.

Gray and white matter signal is largely normal for age throughout
the brain. Solitary chronic microhemorrhage in the medial right
occipital lobe. No cortical encephalomalacia identified. Deep gray
nuclei, brainstem and cerebellum appear negative.

Vascular: Major intracranial vascular flow voids are preserved.

Skull and upper cervical spine: Visualized bone marrow signal is
within normal limits. Hyperostosis of the calvarium. Negative
visible cervical spine.

Sinuses/Orbits: Postoperative changes to both globes. Otherwise
negative orbits. Paranasal sinuses are stable and well aerated.

Other: Trace mastoid fluid with mild retained secretions in the
visible nasopharynx. Grossly negative visible internal auditory
structures.

Broad-based but fairly thin scalp hematoma tracks along the
posterior vertex inferiorly in the posterior scalp and is greater on
the right (series 15, images 34 and 24.
IMPRESSION: 1. No acute intracranial abnormality with essentially normal for age
noncontrast MRI appearance of the brain.
2. Extensive bilateral posterior scalp hematoma.

## 2022-09-22 DIAGNOSIS — R300 Dysuria: Secondary | ICD-10-CM | POA: Diagnosis not present

## 2022-09-22 DIAGNOSIS — K5100 Ulcerative (chronic) pancolitis without complications: Secondary | ICD-10-CM | POA: Diagnosis not present

## 2022-09-22 NOTE — Addendum Note (Signed)
Addended by: Radene Knee L on: 09/22/2022 03:07 PM   Modules accepted: Orders

## 2022-09-22 NOTE — Telephone Encounter (Signed)
Patient verbalized understanding and will come today to have the labs drawn

## 2022-09-23 ENCOUNTER — Telehealth: Payer: Self-pay | Admitting: Gastroenterology

## 2022-09-23 DIAGNOSIS — K5100 Ulcerative (chronic) pancolitis without complications: Secondary | ICD-10-CM | POA: Diagnosis not present

## 2022-09-23 NOTE — Telephone Encounter (Signed)
Pt left message to see if taking humira and cipro is okay please return call

## 2022-09-23 NOTE — Telephone Encounter (Signed)
Called and informed patient it was okay to take the medication while on the humira.

## 2022-09-23 NOTE — Telephone Encounter (Signed)
error 

## 2022-09-28 LAB — ADALIMUMAB+AB (SERIAL MONITOR): Anti-Adalimumab Antibody: <25 ng/mL

## 2022-09-28 LAB — SERIAL MONITORING

## 2022-09-29 LAB — CALPROTECTIN, FECAL: Calprotectin, Fecal: 1030 ug/g — ABNORMAL HIGH (ref 0–120)

## 2022-09-30 ENCOUNTER — Telehealth: Payer: Self-pay | Admitting: Gastroenterology

## 2022-09-30 NOTE — Telephone Encounter (Signed)
PT left message on stool results if they are in please return call

## 2022-10-05 ENCOUNTER — Telehealth: Payer: Self-pay | Admitting: Gastroenterology

## 2022-10-05 MED ORDER — HUMIRA (2 PEN) 40 MG/0.4ML ~~LOC~~ AJKT: 40.0000 mg | AUTO-INJECTOR | SUBCUTANEOUS | 1 refills | Status: DC

## 2022-10-05 MED ORDER — HUMIRA (2 PEN) 40 MG/0.4ML ~~LOC~~ AJKT: 40.0000 mg | AUTO-INJECTOR | SUBCUTANEOUS | 12 refills | Status: DC

## 2022-10-05 NOTE — Addendum Note (Signed)
Addended by: Radene Knee L on: 10/05/2022 10:18 AM   Modules accepted: Orders

## 2022-10-05 NOTE — Telephone Encounter (Signed)
Patient April Larsen levels are back and Humira levels. Can you please review since Dr. Allegra Lai is on vacation or does it need to wait till Dr. Allegra Lai returns.   Patient is on Humira every 14 days and is having diarrhea several times a day.

## 2022-10-05 NOTE — Telephone Encounter (Signed)
Agree , my error , Hymuria q 7 days   Is she on any steroids?

## 2022-10-05 NOTE — Addendum Note (Signed)
Addended by: Radene Knee L on: 10/05/2022 10:56 AM   Modules accepted: Orders

## 2022-10-05 NOTE — Telephone Encounter (Signed)
Patient called back to speak to [facility].

## 2022-10-05 NOTE — Telephone Encounter (Signed)
Called and left a message for call back  

## 2022-10-05 NOTE — Telephone Encounter (Signed)
Patient return call and states she is having diarrhea 3 to 5 times a day. She states on Friday and Saturday she began to have rectal bleeding with the diarrhea. Patient states she is scared to go anywhere because of the diarrhea. She is not on any kind of steroid and does not take any NSAIDS. I am just double checking you want her to go to Humira every 4 weeks instead of every 14 days

## 2022-10-05 NOTE — Telephone Encounter (Signed)
Fecal cal is elevated indicating that colitis not in remission . Drug levels can be higher  1. Reduce frequency of Humira to Q 4 weeks 2. Is she on NSAID's 3. Is she on steroids?

## 2022-10-05 NOTE — Addendum Note (Signed)
Addended by: Radene Knee L on: 10/05/2022 12:58 PM   Modules accepted: Orders

## 2022-10-05 NOTE — Telephone Encounter (Signed)
Per Dr. Tobi Bastos he was confused about the Humira he said to increase medication to every 7 days.  No she is not on any steroids and does not take NSAIDS

## 2022-10-05 NOTE — Telephone Encounter (Signed)
Per Dr. Tobi Bastos he does only want patient on Humira 40mg /0.4ML every 4 weeks. Called [employer] assist and canceled the medication for Humira 40mg /0.48mL for every 7 days. They said they would get this canceled

## 2022-10-06 MED ORDER — BUDESONIDE 3 MG PO CPEP: ORAL_CAPSULE | ORAL | 0 refills | Status: AC

## 2022-10-06 NOTE — Addendum Note (Signed)
Addended by: Radene Knee L on: 10/06/2022 09:14 AM   Modules accepted: Orders

## 2022-10-06 NOTE — Telephone Encounter (Signed)
If she is having multiple episodes of diarrhea we can start her on Budesonide 9 mg a day for 4 weeks and then reduce to 6 mg - she can see Dr Allegra Lai when she returns with appoitnment in 2-3 weeks to decrease dose of budesoide , hopefully the humira increased dose will help by then

## 2022-10-06 NOTE — Telephone Encounter (Signed)
Called and patient verbalized understanding of instructions. She states she will pick up the medication at [address]. She states this morning when she went to the bathroom she had rectal bleeding when she whip and in the toliet bowel. She states this is really scary her.informed patient hopefully the increased does of Humira and Budesonide help Yesterday they said for the patient to call today to schedule shipment. Called [employer] patient assist to see if the medication was ready to schedule shipment. She she states the prescription was still processing and could take another 24 hours. She states shipment of the medication is schedule for 10/16/2022. Informed her that this is not acceptable because we needed the patient to have it on 10/13/2022 or before. She states there is no way possible we can change the date. Asked her if I could talk to a manager she states yes and then disconnected the phone. Called back and talk to [facility] and she states that yes they can change the date but she has to get it approved by leadership. She states they have approved her to get the medication on 10/13/2022. Called and informed patient and patient states that she will take the medication on 10/13/2022 when she gets it because she is taking a does of Humira today.

## 2022-10-07 DIAGNOSIS — E0590 Thyrotoxicosis, unspecified without thyrotoxic crisis or storm: Secondary | ICD-10-CM | POA: Diagnosis not present

## 2022-10-09 DIAGNOSIS — C44722 Squamous cell carcinoma of skin of right lower limb, including hip: Secondary | ICD-10-CM | POA: Diagnosis not present

## 2022-10-09 DIAGNOSIS — D2371 Other benign neoplasm of skin of right lower limb, including hip: Secondary | ICD-10-CM | POA: Diagnosis not present

## 2022-10-15 ENCOUNTER — Telehealth: Payer: Self-pay | Admitting: Gastroenterology

## 2022-10-15 NOTE — Telephone Encounter (Signed)
Patient states she stated taking the Budesonide 9mg   on 10/06/2022 and got the Humiria in the mail on Tuesday and is doing that every 7 days so every Tuesday. She states her last bowel movement was either Monday or Tuesday and it was one time which was a lose diarrhea.  She states that she has the feeling she needs to have a bowel movement and only small pieces comes out. She states that then she will have relief and a little bit later her stomach will have some abdominal cramping and feel like she needs to have a bowel movement and only a little pieces comes out.

## 2022-10-15 NOTE — Telephone Encounter (Signed)
Commence on miralax 1 cap a day , high fiber diet

## 2022-10-15 NOTE — Telephone Encounter (Signed)
Pt left message having problems with chronic constipation for the last few days please return call

## 2022-10-15 NOTE — Telephone Encounter (Signed)
Patient verbalized understanding of instructions and sent to mychart the high fiber diet hand out

## 2022-10-19 ENCOUNTER — Telehealth: Payer: Self-pay

## 2022-10-19 NOTE — Telephone Encounter (Signed)
-----   Message from [facility] sent at 10/18/2022  4:51 PM EDT ----- Reviewed her most recent labs and changes made in her medications.  I will see her for follow-up as scheduled  RV

## 2022-10-23 DIAGNOSIS — D0472 Carcinoma in situ of skin of left lower limb, including hip: Secondary | ICD-10-CM | POA: Diagnosis not present

## 2022-10-30 ENCOUNTER — Other Ambulatory Visit: Payer: Self-pay

## 2022-11-03 ENCOUNTER — Encounter: Payer: Self-pay | Admitting: Gastroenterology

## 2022-11-03 ENCOUNTER — Ambulatory Visit: Payer: PPO | Admitting: Gastroenterology

## 2022-11-03 ENCOUNTER — Telehealth: Payer: Self-pay

## 2022-11-03 VITALS — BP 123/75 | HR 76 | Temp 97.8°F | Ht 65.0 in | Wt 164.0 lb

## 2022-11-03 DIAGNOSIS — K5100 Ulcerative (chronic) pancolitis without complications: Secondary | ICD-10-CM | POA: Diagnosis not present

## 2022-11-03 DIAGNOSIS — E538 Deficiency of other specified B group vitamins: Secondary | ICD-10-CM | POA: Diagnosis not present

## 2022-11-03 DIAGNOSIS — E611 Iron deficiency: Secondary | ICD-10-CM

## 2022-11-03 NOTE — Telephone Encounter (Signed)
-----   Message from [facility] sent at 11/03/2022  4:07 PM EDT ----- Regarding: More labs Please order iron panel, B12 and vitamin D levels  RV

## 2022-11-03 NOTE — Progress Notes (Signed)
[name], MD [address]  [address]  [place], [place] [address]  Main: [phone]  Fax: [phone]    Gastroenterology Consultation  Referring Provider:     Danella Penton, MD Primary Care Physician:  Danella Penton, MD Primary Gastroenterologist:  Dr. [name] Reason for Consultation: Ulcerative colitis        HPI:   April Larsen is a 77 y.o. female referred by Dr. Hyacinth Meeker, Hardin Negus, MD  for consultation & management of ulcerative colitis.    History of present illness: Patient reports feeling significantly better since last visit after increasing Humira to every week and adding budesonide 9 mg daily.  Currently, she is taking budesonide 6 mg daily starting today.  She reports that her diarrhea has resolved.  She does report about once a week constipation and taking MiraLAX as needed only.  She is pleased with improvement in her symptoms   IBD diagnosis: Ulcerative colitis 2013  Disease course: Patient has longstanding history of ulcerative colitis for more than 10 years, previously managed by [facility] clinic gastroenterology.  She was on Asacol, later switched to Apriso.  Patient was previously seen by Dr. Maximino Greenland until 2022.  Her last colonoscopy from 08/22/2020 revealed chronic colitis with mild activity in the cecum and ascending colon.  Chronic colitis without activity and rest of the colon.  TI was normal.  Patient stayed on Apriso 0.375 g 4 pills daily.  Patient states that her ulcerative colitis has been in remission until she had lumbar fusion surgery in December 2022 for severe lumbar scoliosis.  Since surgery, she had 2 hospitalizations secondary to fall.  Since end of April 2023, patient reports that she has noticed flareup of her colitis symptoms including diarrhea, abdominal cramps, episodes of incontinence.  She denies any rectal bleeding.  She also developed hyperthyroidism and started on methimazole.  She has iron deficiency without anemia.   Patient is also on long term colestipol and sucralfate and not sure if these are helping.  Patient underwent hiatal hernia repair as well as sigmoid colectomy in 2018.  She underwent colonoscopy on 01/22/2022, which revealed chronic mild active colitis.  She had a flareup of ulcerative colitis after back surgery at that time which prompted colonoscopy.  Also, her fecal calprotectin levels were elevated at 145.  She received short course of prednisone followed by tried to start her on Uceris.  It was expensive.  I started her on Entocort 3 mg 3 pills daily which she has been taking since November.  Patient gained about 15 to 20 pounds.  She reports having diarrheal episodes and Entocort is not helping much.  She was taking Imodium and Pepto-Bismol as needed for diarrhea.  She has postprandial urgency, sometimes nocturnal diarrhea, denies any rectal bleeding.  She denies any abdominal pain. Patient underwent knee replacement on 05/13/2022 by Dr. Ernest Pine. She underwent workup of iron deficiency anemia, found to have small nonbleeding AVM in the proximal small bowel.  She had a video capsule endoscopy, capsule was retained in the cecum for few months and finally passed.  Humira was initiated on 06/30/2022 with standard induction and biweekly maintenance dose.  Due to ongoing diarrhea short course of low-dose prednisone 20mg  daily was initiated.  Her symptoms improved.  Developed recurrence of diarrhea on 09/10/2022, noticed improvement in her symptoms in first week after taking medication, develops breakthrough diarrhea during second week, worse closer to her next shot.  About 4-5 episodes of watery  diarrhea with no relation to what she eats associated with left lower quadrant pain.  Imodium did not help.  She has significantly elevated fecal calprotectin levels of 1030 on 09/23/2022.  Humira trough levels were 6.7, undetectable antibodies.  Repeat stool studies came back negative for infection.  Therefore, Humira has been  increased to every week and started on budesonide 9 mg daily  Extra intestinal manifestations: None  IBD surgical history: Sigmoid colectomy in 2018  Imaging:  MRE none CTE none SBFT none  Procedures:  EGD and colonoscopy 01/22/2022 DIAGNOSIS: A. DUODENUM; COLD BIOPSY: - ENTERIC MUCOSA WITH PRESERVED VILLOUS ARCHITECTURE AND NO SIGNIFICANT HISTOPATHOLOGIC CHANGE. - NEGATIVE FOR FEATURES OF CELIAC, DYSPLASIA, AND MALIGNANCY.  B. STOMACH, RANDOM BIOPSY: - GASTRIC ANTRAL MUCOSA WITH FEATURES OF MILD REACTIVE GASTROPATHY. - GASTRIC OXYNTIC MUCOSA WITH CHANGES OF PPI EFFECT. - NEGATIVE FOR H. PYLORI, DYSPLASIA, AND MALIGNANCY.  C. COLON, RIGHT; COLD BIOPSY: - CHRONIC COLITIS WITH MILD ACTIVITY (CRYPTITIS). - NEGATIVE FOR GRANULOMA, DYSPLASIA, AND MALIGNANCY.  D. COLON POLYP, DESCENDING; COLD SNARE: - POLYPOID FRAGMENTS OF BENIGN COLONIC MUCOSA WITH SUPERFICIAL REACTIVE CHANGES AND SMALL LYMPHOID AGGREGATE. - NEGATIVE FOR DYSPLASIA AND MALIGNANCY.  E. COLON, LEFT; COLD BIOPSY: - CHRONIC COLITIS WITH MILD ACTIVITY (FOCAL CRYPTITIS). - NEGATIVE FOR GRANULOMA, DYSPLASIA, AND MALIGNANCY.    Sigmoid colectomy 11/24/2016 DIAGNOSIS:  A.  SIGMOID COLON; LAPAROSCOPIC LOW ANTERIOR RESECTION:  - DIVERTICULITIS.  - SEROSAL ADHESIONS.  - REACTIVE LYMPH NODE.  - UNREMARKABLE RESECTION MARGINS.   Upper endoscopy 02/07/2020 - Normal esophagus. - 5 cm hiatal hernia. - Erythematous mucosa in the antrum. Biopsied. - A single gastric polyp. Biopsied. - Normal duodenal bulb, second portion of the duodenum and examined duodenum. - Biopsies were obtained in the gastric body, at the incisura and in the gastric antrum. DIAGNOSIS:  A. STOMACH ERYTHEMA; COLD BIOPSY:  - REACTIVE GASTROPATHY.  - NEGATIVE FOR ACTIVE INFLAMMATION AND H PYLORI.  - NEGATIVE FOR INTESTINAL METAPLASIA, DYSPLASIA, AND MALIGNANCY.   B. STOMACH POLYP; COLD BIOPSY:  - HYPERPLASTIC POLYP.  - NEGATIVE FOR DYSPLASIA  AND MALIGNANCY.  Colonoscopy 08/22/2020 - Erythematous mucosa in the ascending colon and in the cecum. Biopsied. - Diverticulosis in the sigmoid colon. - Patent surgical anastomosis, characterized by erythema. - The examination was otherwise normal. - The rectum, sigmoid colon, descending colon, transverse colon and terminal ileum are normal. Biopsied. - Non-bleeding internal hemorrhoids. DIAGNOSIS:  A. TERMINAL ILEUM; COLD BIOPSY:  - ENTERIC MUCOSA WITH NORMAL VILLOUS ARCHITECTURE AND NO SIGNIFICANT  HISTOPATHOLOGIC CHANGE.  - SINGLE FRAGMENT OF COLONIC MUCOSA WITH NO SIGNIFICANT HISTOPATHOLOGIC  CHANGE.  - NEGATIVE FOR ACTIVE ILEITIS/COLITIS.  - NEGATIVE FOR GRANULOMA, DYSPLASIA, AND MALIGNANCY.   B. COLON, CECUM AND ASCENDING; COLD BIOPSY:  - CHRONIC COLITIS WITH MILD ACTIVITY (FOCAL CRYPTITIS AND SUPERFICIAL  ACTIVE MUCOSAL INFLAMMATION).  - NEGATIVE FOR GRANULOMA, DYSPLASIA, AND MALIGNANCY.   C. COLON, TRANSVERSE AND DESCENDING; COLD BIOPSY:  - CHRONIC COLITIS WITHOUT ACTIVITY.  - NEGATIVE FOR GRANULOMA, DYSPLASIA, AND MALIGNANCY.   D. COLON, RECTOSIGMOID; COLD BIOPSY:  - CHRONIC PROCTITIS WITHOUT ACTIVITY.  - NEGATIVE FOR GRANULOMA, DYSPLASIA, AND MALIGNANCY.    VCE 05/15/2022, study is incomplete, capsule in the stomach Repeat video capsule endoscopy 06/24/2022 Study is incomplete, capsule did not reach cecum Capsule was placed endoscopically and second portion of duodenum Solitary nonbleeding small bowel AVM was found in the proximal small bowel  IBD medications:  Steroids: Responsive to prednisone, budesonide 5-ASA: Treated with Apriso in the past  Immunomodulators: AZA, methotrexate nave  TPMT status unknown Biologics:  Anti TNFs: Humira initiated on 06/30/2022, increased to every week in 09/2022 Anti Integrins: None Ustekinumab: None Tofactinib: None Clinical trial: None    NSAIDs: None  Antiplts/Anticoagulants/Anti thrombotics: None   Past Medical  History:  Diagnosis Date   Actinic keratosis    Anemia    LAST HGB 15.2 ON 04-22-16   Anxiety    Arthritis    Cancer (HCC) 2016   skin   Cataract (lens) fragments in eye following cataract surgery, bilateral 2013   one eye done then 4 weeks later the other eye done.   Chronic ulcerative colitis, without complications (HCC) 10/29/2015   Diverticulitis 2018   Esophagitis, reflux 10/04/2014   Family history of adverse reaction to anesthesia    SISTER HARD TO WAKE UP and nausea and vomiting.   Female stress incontinence 08/09/2013   Gastroesophageal reflux disease without esophagitis 08/06/2016   GERD (gastroesophageal reflux disease)    Headache    MIGRAINE   History of hiatal hernia    Hypertension    Hyperthyroidism    Incomplete emptying of bladder 10/29/2013   Migraine 07/26/2013   Pneumonia due to COVID-19 virus 2020   PONV (postoperative nausea and vomiting)    nausea, does not remember vomiting.   Postherpetic neuralgia    RIGHT 7TH.CRANIAL NERVE from shingles followed by Bell's palsy   Postmenopausal 11/09/2016   PVC (premature ventricular contraction)    Scoliosis    Scoliosis (and kyphoscoliosis), idiopathic 07/26/2013   Scoliosis of lumbosacral spine 01/18/2015   Squamous cell carcinoma of skin 03/13/2013   L clavicle    Squamous cell carcinoma of skin 03/10/2018   L med knee   Squamous cell carcinoma of skin 02/21/2019   R cheek    Squamous cell carcinoma of skin 06/01/2019   L mid lat pretibial - ED&C   Trochanteric bursitis 11/29/2014   Ulcerative colitis (HCC)    Urethral prolapse 08/09/2013   Urge incontinence 08/09/2013    Past Surgical History:  Procedure Laterality Date   ABDOMINAL HYSTERECTOMY     BACK SURGERY  03/12/2021   L3-L4 AND L3-S1 DECOMPRESSION   CARPAL TUNNEL RELEASE Bilateral    CATARACT EXTRACTION W/ INTRAOCULAR LENS  IMPLANT, BILATERAL     CHOLECYSTECTOMY N/A 08/21/2016   Procedure: LAPAROSCOPIC CHOLECYSTECTOMY WITH INTRAOPERATIVE  CHOLANGIOGRAM;  Surgeon: Nadeen Landau, MD;  Location: [facility] ORS;  Service: General;  Laterality: N/A;   COLONOSCOPY  11/06/2005   COLONOSCOPY  2010   COLONOSCOPY  2013   COLONOSCOPY WITH PROPOFOL N/A 07/29/2016   Procedure: COLONOSCOPY WITH PROPOFOL;  Surgeon: Scot Jun, MD;  Location: [facility] ENDOSCOPY;  Service: Endoscopy;  Laterality: N/A;   COLONOSCOPY WITH PROPOFOL N/A 08/22/2020   Procedure: COLONOSCOPY WITH PROPOFOL;  Surgeon: Pasty Spillers, MD;  Location: [facility] ENDOSCOPY;  Service: Endoscopy;  Laterality: N/A;   COLONOSCOPY WITH PROPOFOL N/A 01/22/2022   Procedure: COLONOSCOPY WITH PROPOFOL;  Surgeon: Toney Reil, MD;  Location: [facility];  Service: Endoscopy;  Laterality: N/A;   COLONOSCOPY WITH PROPOFOL N/A 07/23/2022   Procedure: COLONOSCOPY WITH PROPOFOL;  Surgeon: Toney Reil, MD;  Location: [facility] SURGERY CNTR;  Service: Endoscopy;  Laterality: N/A;   ESOPHAGOGASTRODUODENOSCOPY  11/06/2005   ESOPHAGOGASTRODUODENOSCOPY N/A 06/24/2022   Procedure: ESOPHAGOGASTRODUODENOSCOPY (EGD);  Surgeon: Toney Reil, MD;  Location: [facility] ENDOSCOPY;  Service: Gastroenterology;  Laterality: N/A;   ESOPHAGOGASTRODUODENOSCOPY (EGD) WITH PROPOFOL N/A 07/29/2016   Procedure: ESOPHAGOGASTRODUODENOSCOPY (EGD) WITH PROPOFOL;  Surgeon: Scot Jun,  MD;  Location: [facility] ENDOSCOPY;  Service: Endoscopy;  Laterality: N/A;   ESOPHAGOGASTRODUODENOSCOPY (EGD) WITH PROPOFOL N/A 02/07/2020   Procedure: ESOPHAGOGASTRODUODENOSCOPY (EGD) WITH PROPOFOL;  Surgeon: Pasty Spillers, MD;  Location: [facility] ENDOSCOPY;  Service: Endoscopy;  Laterality: N/A;   ESOPHAGOGASTRODUODENOSCOPY (EGD) WITH PROPOFOL N/A 01/22/2022   Procedure: ESOPHAGOGASTRODUODENOSCOPY (EGD) WITH PROPOFOL;  Surgeon: Toney Reil, MD;  Location: [facility];  Service: Endoscopy;  Laterality: N/A;   EYE SURGERY     GIVENS CAPSULE STUDY N/A 05/07/2022   Procedure: GIVENS CAPSULE STUDY;   Surgeon: Toney Reil, MD;  Location: [facility] ENDOSCOPY;  Service: Gastroenterology;  Laterality: N/A;   GIVENS CAPSULE STUDY N/A 06/24/2022   Procedure: GIVENS CAPSULE STUDY;  Surgeon: Toney Reil, MD;  Location: [facility] ENDOSCOPY;  Service: Gastroenterology;  Laterality: N/A;   KNEE ARTHROPLASTY Right 05/13/2022   Procedure: COMPUTER ASSISTED TOTAL KNEE ARTHROPLASTY;  Surgeon: Donato Heinz, MD;  Location: [facility] ORS;  Service: Orthopedics;  Laterality: Right;   LAPAROSCOPIC LOW ANTERIOR RESECTION  11/24/2016   Procedure: LAPAROSCOPIC LOW ANTERIOR RESECTION;  Surgeon: Leafy Ro, MD;  Location: [facility] ORS;  Service: General;;   LUMBAR LAMINECTOMY  2004   L3-4   PARTIAL COLECTOMY  2018   POLYPECTOMY  01/22/2022   Procedure: POLYPECTOMY;  Surgeon: Toney Reil, MD;  Location: [facility]ERY [facility];  Service: Endoscopy;;   POSTERIOR LAMINECTOMY / DECOMPRESSION LUMBAR SPINE  2009   L3-S1   TONSILLECTOMY     XI ROBOTIC ASSISTED PARAESOPHAGEAL HERNIA REPAIR N/A 02/27/2020   Procedure: XI ROBOTIC ASSISTED PARAESOPHAGEAL HERNIA REPAIR;  Surgeon: Leafy Ro, MD;  Location: [facility] ORS;  Service: General;  Laterality: N/A;     Current Outpatient Medications:    acetaminophen (TYLENOL) 650 MG CR tablet, Take 650 mg by mouth 2 (two) times daily., Disp: , Rfl:    adalimumab (HUMIRA, 2 PEN,) 40 MG/0.4ML pen, Inject 0.4 mLs (40 mg total) into the skin every 7 (seven) days., Disp: 4 each, Rfl: 12   amoxicillin (AMOXIL) 500 MG capsule, Take 2,000 mg by mouth as needed (Take before dental appts)., Disp: , Rfl:    ascorbic Acid (VITAMIN C) 500 MG CPCR, 500 mg once daily, Disp: , Rfl:    budesonide (ENTOCORT EC) 3 MG 24 hr capsule, Take 3 capsules (9 mg total) by mouth daily for 28 days, THEN 2 capsules (6 mg total) daily., Disp: 144 capsule, Rfl: 0   DULoxetine (CYMBALTA) 60 MG capsule, Take 60 mg by mouth at bedtime. , Disp: , Rfl: 3   ferrous gluconate (FERGON) 324 MG tablet, Take 1  tablet by mouth daily with breakfast., Disp: , Rfl:    furosemide (LASIX) 20 MG tablet, Take 20 mg by mouth daily as needed., Disp: , Rfl:    meclizine (ANTIVERT) 25 MG tablet, Take 1 tablet (25 mg total) by mouth 3 (three) times daily as needed for dizziness or nausea., Disp: 30 tablet, Rfl: 0   methimazole (TAPAZOLE) 5 MG tablet, Take 1 tablet (5 mg total) by mouth 2 (two) times daily. (Patient taking differently: Take 5 mg by mouth daily. M-W-F 5 mg T-Th-Sat-Sun 2.5 mg), Disp: , Rfl:    pantoprazole (PROTONIX) 40 MG tablet, Take 40 mg by mouth 2 (two) times daily., Disp: , Rfl:    polyethylene glycol powder (GLYCOLAX/MIRALAX) 17 GM/SCOOP powder, Take 255 g by mouth daily., Disp: 238 g, Rfl: 0   triamterene-hydrochlorothiazide (DYAZIDE) 37.5-25 MG capsule, Take 1 capsule by mouth daily., Disp: , Rfl:  Family History  Problem Relation Age of Onset   Breast cancer Sister 33   Breast cancer Paternal Aunt 29   Breast cancer Cousin        1st paternal cousin   Bladder Cancer Neg Hx    Prostate cancer Neg Hx    Kidney cancer Neg Hx      Social History   Tobacco Use   Smoking status: Never   Smokeless tobacco: Never  Vaping Use   Vaping status: Never Used  Substance Use Topics   Alcohol use: No   Drug use: No    Allergies as of 11/03/2022 - Review Complete 11/03/2022  Allergen Reaction Noted   Hydromorphone Other (See Comments) 08/11/2021   Nsaids Other (See Comments) 01/05/2020   Sulfa antibiotics Rash 08/24/2015    Review of Systems:    All systems reviewed and negative except where noted in HPI.   Physical Exam:  BP 123/75 (BP Location: Left Arm, Patient Position: Sitting, Cuff Size: Normal)   Pulse 76   Temp 97.8 F (36.6 C) (Oral)   Ht 5\' 5"  (1.651 m)   Wt 164 lb (74.4 kg)   BMI 27.29 kg/m  No LMP recorded. Patient has had a hysterectomy.  General:   Alert,  Well-developed, well-nourished, pleasant and cooperative in NAD Head:  Normocephalic and  atraumatic. Eyes:  Sclera clear, no icterus.   Conjunctiva pink. Ears:  Normal auditory acuity. Nose:  No deformity, discharge, or lesions. Mouth:  No deformity or lesions,oropharynx pink & moist. Neck:  Supple; no masses or thyromegaly. Lungs:  Respirations even and unlabored.  Clear throughout to auscultation.   No wheezes, crackles, or rhonchi. No acute distress. Heart:  Regular rate and rhythm; no murmurs, clicks, rubs, or gallops. Abdomen:  Normal bowel sounds. Soft, non-tender and non-distended without masses, hepatosplenomegaly or hernias noted.  No guarding or rebound tenderness.   Rectal: Not performed Msk:  Symmetrical without gross deformities. Good, equal movement & strength bilaterally. Pulses:  Normal pulses noted. Extremities:  No clubbing or edema.  No cyanosis. Neurologic:  Alert and oriented x3;  grossly normal neurologically. Skin:  Intact without significant lesions or rashes. No jaundice. Psych:  Alert and cooperative. Normal mood and affect.  Imaging Studies: No recent abdominal imaging  Assessment and Plan:   April Larsen is a 77 y.o. female with longstanding history of ulcerative colitis, maintained on mesalamine, lumbar scoliosis s/p lumbar fusion in December 2022, large hiatal hernia s/p repair, sigmoid diverticulitis s/p sigmoid colectomy in 2018 is seen in consultation for ulcerative pan colitis and history of iron deficiency anemia  Ulcerative colitis: Not in remission since her back surgery in mid 2023 Elevated fecal calprotectin levels Stool studies negative for infection including C. difficile Colonoscopy revealed chronic mild active pan colitis Initiated Humira on 06/30/2022 with standard induction and maintenance dose biweekly.  Given ongoing diarrhea and breakthrough symptoms 1 week before the next shot, subtherapeutic Humira levels and significantly elevated fecal calprotectin levels, Humira has been increased to weekly since 09/2022 Started on  budesonide 9 mg daily Currently in clinical remission, decrease budesonide to 6 mg daily for 2 weeks then 3 mg daily Check Humira trough levels and antibodies before next shot Check CBC, CMP every 3 months  She is up-to-date with vaccinations, due for third dose of Twinrix vaccine in 11/2022  Iron deficiency anemia as well as B12 deficiency EGD and colonoscopy did not reveal source of anemia First video capsule endoscopy, capsule did not leave stomach.  Subsequently, underwent endoscopic placement of video capsule on 06/24/2022.  Revealed a solitary small nonbleeding AVM in the proximal small bowel.  Capsule did not reach cecum.  Serial x-rays revealed retained capsule in the cecum which is finally evacuated spontaneously. Most recent serum ferritin was 9, B12 206 in 07/2022 Continue oral iron every other day and daily B12 Recheck iron panel and B12 levels  Follow up in 3 to 4 months   [name], MD

## 2022-11-03 NOTE — Telephone Encounter (Signed)
Order lab work  ?

## 2022-11-09 DIAGNOSIS — K5100 Ulcerative (chronic) pancolitis without complications: Secondary | ICD-10-CM | POA: Diagnosis not present

## 2022-11-09 DIAGNOSIS — E538 Deficiency of other specified B group vitamins: Secondary | ICD-10-CM | POA: Diagnosis not present

## 2022-11-09 DIAGNOSIS — E611 Iron deficiency: Secondary | ICD-10-CM | POA: Diagnosis not present

## 2022-11-10 ENCOUNTER — Telehealth: Payer: Self-pay

## 2022-11-10 NOTE — Telephone Encounter (Signed)
Patient verbalized understanding of results and will try to drink more water.

## 2022-11-10 NOTE — Telephone Encounter (Signed)
-----   Message from [facility] sent at 11/10/2022  2:08 PM EDT ----- Please inform patient that based on her kidney function test, she appears to be somewhat dehydrated, advise to drink more water daily  RV

## 2022-11-11 ENCOUNTER — Other Ambulatory Visit: Payer: Self-pay | Admitting: Internal Medicine

## 2022-11-11 DIAGNOSIS — Z1231 Encounter for screening mammogram for malignant neoplasm of breast: Secondary | ICD-10-CM

## 2022-11-20 ENCOUNTER — Ambulatory Visit
Admission: RE | Admit: 2022-11-20 | Discharge: 2022-11-20 | Disposition: A | Discharge status: Home / Self Care | Payer: PPO | Source: Ambulatory Visit | Attending: Internal Medicine | Admitting: Internal Medicine

## 2022-11-20 DIAGNOSIS — Z1231 Encounter for screening mammogram for malignant neoplasm of breast: Secondary | ICD-10-CM | POA: Diagnosis not present

## 2022-11-29 DIAGNOSIS — R300 Dysuria: Secondary | ICD-10-CM | POA: Diagnosis not present

## 2022-11-29 DIAGNOSIS — N390 Urinary tract infection, site not specified: Secondary | ICD-10-CM | POA: Diagnosis not present

## 2022-12-22 DIAGNOSIS — R42 Dizziness and giddiness: Secondary | ICD-10-CM | POA: Diagnosis not present

## 2022-12-22 DIAGNOSIS — N390 Urinary tract infection, site not specified: Secondary | ICD-10-CM | POA: Diagnosis not present

## 2022-12-25 DIAGNOSIS — C44711 Basal cell carcinoma of skin of unspecified lower limb, including hip: Secondary | ICD-10-CM | POA: Diagnosis not present

## 2022-12-25 DIAGNOSIS — F325 Major depressive disorder, single episode, in full remission: Secondary | ICD-10-CM | POA: Diagnosis not present

## 2022-12-25 DIAGNOSIS — G8929 Other chronic pain: Secondary | ICD-10-CM | POA: Diagnosis not present

## 2022-12-25 DIAGNOSIS — D84821 Immunodeficiency due to drugs: Secondary | ICD-10-CM | POA: Diagnosis not present

## 2022-12-25 DIAGNOSIS — D509 Iron deficiency anemia, unspecified: Secondary | ICD-10-CM | POA: Diagnosis not present

## 2022-12-25 DIAGNOSIS — C44319 Basal cell carcinoma of skin of other parts of face: Secondary | ICD-10-CM | POA: Diagnosis not present

## 2022-12-25 DIAGNOSIS — G4700 Insomnia, unspecified: Secondary | ICD-10-CM | POA: Diagnosis not present

## 2022-12-25 DIAGNOSIS — G629 Polyneuropathy, unspecified: Secondary | ICD-10-CM | POA: Diagnosis not present

## 2022-12-25 DIAGNOSIS — E663 Overweight: Secondary | ICD-10-CM | POA: Diagnosis not present

## 2022-12-25 DIAGNOSIS — F419 Anxiety disorder, unspecified: Secondary | ICD-10-CM | POA: Diagnosis not present

## 2022-12-25 DIAGNOSIS — E0590 Thyrotoxicosis, unspecified without thyrotoxic crisis or storm: Secondary | ICD-10-CM | POA: Diagnosis not present

## 2022-12-25 DIAGNOSIS — K5100 Ulcerative (chronic) pancolitis without complications: Secondary | ICD-10-CM | POA: Diagnosis not present

## 2023-01-05 DIAGNOSIS — E782 Mixed hyperlipidemia: Secondary | ICD-10-CM | POA: Diagnosis not present

## 2023-01-05 DIAGNOSIS — D500 Iron deficiency anemia secondary to blood loss (chronic): Secondary | ICD-10-CM | POA: Diagnosis not present

## 2023-01-05 DIAGNOSIS — R739 Hyperglycemia, unspecified: Secondary | ICD-10-CM | POA: Diagnosis not present

## 2023-01-05 DIAGNOSIS — R7989 Other specified abnormal findings of blood chemistry: Secondary | ICD-10-CM | POA: Diagnosis not present

## 2023-01-05 DIAGNOSIS — E538 Deficiency of other specified B group vitamins: Secondary | ICD-10-CM | POA: Diagnosis not present

## 2023-01-12 ENCOUNTER — Telehealth: Payer: Self-pay | Admitting: Gastroenterology

## 2023-01-12 DIAGNOSIS — K5190 Ulcerative colitis, unspecified, without complications: Secondary | ICD-10-CM | POA: Diagnosis not present

## 2023-01-12 DIAGNOSIS — Z0000 Encounter for general adult medical examination without abnormal findings: Secondary | ICD-10-CM | POA: Diagnosis not present

## 2023-01-12 DIAGNOSIS — E0590 Thyrotoxicosis, unspecified without thyrotoxic crisis or storm: Secondary | ICD-10-CM | POA: Diagnosis not present

## 2023-01-12 DIAGNOSIS — M4692 Unspecified inflammatory spondylopathy, cervical region: Secondary | ICD-10-CM | POA: Diagnosis not present

## 2023-01-12 DIAGNOSIS — M5412 Radiculopathy, cervical region: Secondary | ICD-10-CM | POA: Diagnosis not present

## 2023-01-12 DIAGNOSIS — Z23 Encounter for immunization: Secondary | ICD-10-CM | POA: Diagnosis not present

## 2023-01-12 DIAGNOSIS — K529 Noninfective gastroenteritis and colitis, unspecified: Secondary | ICD-10-CM

## 2023-01-12 DIAGNOSIS — E782 Mixed hyperlipidemia: Secondary | ICD-10-CM | POA: Diagnosis not present

## 2023-01-12 NOTE — Telephone Encounter (Addendum)
Patient called in and left a voicemail to speak to Dennison. She stated that she is having soreness in between her neck and her shoulder. She stated that the medicine (Humira) is no longer working. She needs to get a flu shot and she is going to ask about getting a cortisone shot. She wants to know if that is okay to get that shot. She is having diarrhea again and she would like a call back. I called the patient to confirm we have got her voicemail and there was no answer so I left a message for her to call us back.

## 2023-01-12 NOTE — Telephone Encounter (Signed)
Patient states she went to her physical this morning and she said the shoulder pain was coming a condition that she needs surgery for. She states he went on and gave her the Flu shot today because he said it would not hurt her. She states she is taking the Humira every 7 days. She states her symptoms were better when she was taking the Budesonide with the Humira but after she stopped taking the medication her diarrhea returned. Patient states she is having diarrhea 5 to 6 times a day and every time she eats she will have the diarrhea. She states this morning he has not ate anything and she has had diarrhea 2 times all ready. Denies any abdominal pain, rectal bleeding. Please advised what you recommend has follow up appointment on 02/03/2023

## 2023-01-13 DIAGNOSIS — K529 Noninfective gastroenteritis and colitis, unspecified: Secondary | ICD-10-CM | POA: Diagnosis not present

## 2023-01-13 MED ORDER — BUDESONIDE 3 MG PO CPEP: 9.0000 mg | ORAL_CAPSULE | Freq: Every day | ORAL | 0 refills | Status: DC

## 2023-01-13 NOTE — Addendum Note (Signed)
Addended by: Radene Knee L on: 01/13/2023 09:34 AM   Modules accepted: Orders

## 2023-01-13 NOTE — Telephone Encounter (Signed)
Check fecal calprotectin levels, GI profile PCR and C Diff toxin  Restart budesonide 9mg  daily  RV

## 2023-01-13 NOTE — Telephone Encounter (Signed)
Patient verbalized understanding of instructions. She will come pick up the stool kits today and pick up the medication. Sent medication to the pharmacy

## 2023-01-17 LAB — GI PROFILE, STOOL, PCR

## 2023-01-17 LAB — C DIFFICILE, CYTOTOXIN B

## 2023-01-17 LAB — CALPROTECTIN, FECAL: Calprotectin, Fecal: 76 ug/g (ref 0–120)

## 2023-01-17 LAB — C DIFFICILE TOXINS A+B W/RFLX: C difficile Toxins A+B, EIA: NEGATIVE

## 2023-01-18 ENCOUNTER — Telehealth: Payer: Self-pay

## 2023-01-18 NOTE — Telephone Encounter (Signed)
-----   Message from [facility] sent at 01/18/2023 11:43 AM EDT ----- Please inform patient that all her stool study results came back normal including fecal calprotectin levels.  Based on these results, cannot really tell if her diarrhea is secondary to flareup of ulcerative colitis because of normal fecal calprotectin levels.  This could be related to any recent viral infection.  If she is still having diarrhea, please have her try Imodium as needed every 6-8 hours to see if her diarrhea improves.  Also, she can continue budesonide 9 mg daily for about 2 weeks and then decrease the dose.  She should call us back if her symptoms are worse  RV

## 2023-01-18 NOTE — Telephone Encounter (Signed)
Patient verbalized understanding of results. Informed patient to take the Budesonide 9mg  for 2 weeks then decrease to 6mg  for 1 week and then 3mg  for 1 week then stop. She verbalized understanding and states she takes her Humira tomorrow.

## 2023-02-01 ENCOUNTER — Telehealth: Payer: Self-pay

## 2023-02-01 NOTE — Telephone Encounter (Signed)
Humira patient assistance is expiring 03/30/2023. Patient signed application form and will faxed application form after appointment on Wednesday with Dr. Allegra Lai to make sure we do not change the medication

## 2023-02-03 ENCOUNTER — Encounter: Payer: Self-pay | Admitting: Gastroenterology

## 2023-02-03 ENCOUNTER — Ambulatory Visit: Payer: PPO | Admitting: Gastroenterology

## 2023-02-03 ENCOUNTER — Other Ambulatory Visit: Payer: Self-pay

## 2023-02-03 VITALS — BP 135/84 | HR 80 | Temp 97.7°F | Ht 65.0 in | Wt 168.4 lb

## 2023-02-03 DIAGNOSIS — K31819 Angiodysplasia of stomach and duodenum without bleeding: Secondary | ICD-10-CM | POA: Diagnosis not present

## 2023-02-03 DIAGNOSIS — Z23 Encounter for immunization: Secondary | ICD-10-CM

## 2023-02-03 DIAGNOSIS — K5100 Ulcerative (chronic) pancolitis without complications: Secondary | ICD-10-CM | POA: Diagnosis not present

## 2023-02-03 DIAGNOSIS — Z862 Personal history of diseases of the blood and blood-forming organs and certain disorders involving the immune mechanism: Secondary | ICD-10-CM

## 2023-02-03 MED ORDER — NA SULFATE-K SULFATE-MG SULF 17.5-3.13-1.6 GM/177ML PO SOLN: 354.0000 mL | Freq: Once | ORAL | 0 refills | Status: AC

## 2023-02-03 NOTE — Progress Notes (Signed)
[name], MD [address]  [address]  [place], [place] [address]  Main: [phone]  Fax: [phone]    Gastroenterology Consultation  Referring Provider:     Danella Penton, MD Primary Care Physician:  Danella Penton, MD Primary Gastroenterologist:  Dr. [name] Reason for Consultation: Ulcerative colitis        HPI:   April Larsen is a 77 y.o. female referred by Dr. Hyacinth Meeker, Hardin Negus, MD  for consultation & management of ulcerative colitis.    History of present illness: April Larsen is here for follow-up of ulcerative colitis.  Patient reports feeling significantly better since increasing Humira to weekly from 09/2022, and adding budesonide 9 mg daily.  Her budesonide has been tapered off.  She called our office back in mid October due to flareup of diarrhea, stool studies were negative for infection as well as fecal calprotectin levels were normal.  These levels have significantly improved from 1030 which were checked end of June 2024.  I restarted her on budesonide 9 mg daily, her diarrhea has resolved.  Her Humira trough levels have been therapeutic on weekly dosing with undetectable antibodies. She reports that her diarrhea has resolved.    IBD diagnosis: Ulcerative colitis 2013  Disease course: Patient has longstanding history of ulcerative colitis for more than 10 years, previously managed by [facility] clinic gastroenterology.  She was on Asacol, later switched to Apriso.  Patient was previously seen by Dr. Maximino Greenland until 2022.  Her last colonoscopy from 08/22/2020 revealed chronic colitis with mild activity in the cecum and ascending colon.  Chronic colitis without activity and rest of the colon.  TI was normal.  Patient stayed on Apriso 0.375 g 4 pills daily.  Patient states that her ulcerative colitis has been in remission until she had lumbar fusion surgery in December 2022 for severe lumbar scoliosis.  Since surgery, she had 2 hospitalizations secondary to  fall.  Since end of April 2023, patient reports that she has noticed flareup of her colitis symptoms including diarrhea, abdominal cramps, episodes of incontinence.  She denies any rectal bleeding.  She also developed hyperthyroidism and started on methimazole.  She has iron deficiency without anemia.  Patient is also on long term colestipol and sucralfate and not sure if these are helping.  Patient underwent hiatal hernia repair as well as sigmoid colectomy in 2018.  She underwent colonoscopy on 01/22/2022, which revealed chronic mild active colitis.  She had a flareup of ulcerative colitis after back surgery at that time which prompted colonoscopy.  Also, her fecal calprotectin levels were elevated at 145.  She received short course of prednisone followed by tried to start her on Uceris.  It was expensive.  I started her on Entocort 3 mg 3 pills daily which she has been taking since November.  Patient gained about 15 to 20 pounds.  She reports having diarrheal episodes and Entocort is not helping much.  She was taking Imodium and Pepto-Bismol as needed for diarrhea.  She has postprandial urgency, sometimes nocturnal diarrhea, denies any rectal bleeding.  She denies any abdominal pain. Patient underwent knee replacement on 05/13/2022 by Dr. Ernest Pine. She underwent workup of iron deficiency anemia, found to have small nonbleeding AVM in the proximal small bowel.  She had a video capsule endoscopy, capsule was retained in the cecum for few months and finally passed.  Humira was initiated on 06/30/2022 with standard induction and biweekly maintenance dose.  Due to ongoing  diarrhea short course of low-dose prednisone 20mg  daily was initiated.  Her symptoms improved.  Developed recurrence of diarrhea on 09/10/2022, noticed improvement in her symptoms in first week after taking medication, develops breakthrough diarrhea during second week, worse closer to her next shot.  About 4-5 episodes of watery diarrhea with no relation  to what she eats associated with left lower quadrant pain.  Imodium did not help.  She has significantly elevated fecal calprotectin levels of 1030 on 09/23/2022.  Humira trough levels were 6.7, undetectable antibodies.  Repeat stool studies came back negative for infection.  Therefore, Humira has been increased to every week and started on budesonide 9 mg daily.  Fecal calprotectin levels are normal as of 01/13/2023  Extra intestinal manifestations: None  IBD surgical history: Sigmoid colectomy in 2018  Imaging:  MRE none CTE none SBFT none  Procedures:  EGD and colonoscopy 01/22/2022 DIAGNOSIS: A. DUODENUM; COLD BIOPSY: - ENTERIC MUCOSA WITH PRESERVED VILLOUS ARCHITECTURE AND NO SIGNIFICANT HISTOPATHOLOGIC CHANGE. - NEGATIVE FOR FEATURES OF CELIAC, DYSPLASIA, AND MALIGNANCY.  B. STOMACH, RANDOM BIOPSY: - GASTRIC ANTRAL MUCOSA WITH FEATURES OF MILD REACTIVE GASTROPATHY. - GASTRIC OXYNTIC MUCOSA WITH CHANGES OF PPI EFFECT. - NEGATIVE FOR H. PYLORI, DYSPLASIA, AND MALIGNANCY.  C. COLON, RIGHT; COLD BIOPSY: - CHRONIC COLITIS WITH MILD ACTIVITY (CRYPTITIS). - NEGATIVE FOR GRANULOMA, DYSPLASIA, AND MALIGNANCY.  D. COLON POLYP, DESCENDING; COLD SNARE: - POLYPOID FRAGMENTS OF BENIGN COLONIC MUCOSA WITH SUPERFICIAL REACTIVE CHANGES AND SMALL LYMPHOID AGGREGATE. - NEGATIVE FOR DYSPLASIA AND MALIGNANCY.  E. COLON, LEFT; COLD BIOPSY: - CHRONIC COLITIS WITH MILD ACTIVITY (FOCAL CRYPTITIS). - NEGATIVE FOR GRANULOMA, DYSPLASIA, AND MALIGNANCY.    Sigmoid colectomy 11/24/2016 DIAGNOSIS:  A.  SIGMOID COLON; LAPAROSCOPIC LOW ANTERIOR RESECTION:  - DIVERTICULITIS.  - SEROSAL ADHESIONS.  - REACTIVE LYMPH NODE.  - UNREMARKABLE RESECTION MARGINS.   Upper endoscopy 02/07/2020 - Normal esophagus. - 5 cm hiatal hernia. - Erythematous mucosa in the antrum. Biopsied. - A single gastric polyp. Biopsied. - Normal duodenal bulb, second portion of the duodenum and examined duodenum. - Biopsies  were obtained in the gastric body, at the incisura and in the gastric antrum. DIAGNOSIS:  A. STOMACH ERYTHEMA; COLD BIOPSY:  - REACTIVE GASTROPATHY.  - NEGATIVE FOR ACTIVE INFLAMMATION AND H PYLORI.  - NEGATIVE FOR INTESTINAL METAPLASIA, DYSPLASIA, AND MALIGNANCY.   B. STOMACH POLYP; COLD BIOPSY:  - HYPERPLASTIC POLYP.  - NEGATIVE FOR DYSPLASIA AND MALIGNANCY.  Colonoscopy 08/22/2020 - Erythematous mucosa in the ascending colon and in the cecum. Biopsied. - Diverticulosis in the sigmoid colon. - Patent surgical anastomosis, characterized by erythema. - The examination was otherwise normal. - The rectum, sigmoid colon, descending colon, transverse colon and terminal ileum are normal. Biopsied. - Non-bleeding internal hemorrhoids. DIAGNOSIS:  A. TERMINAL ILEUM; COLD BIOPSY:  - ENTERIC MUCOSA WITH NORMAL VILLOUS ARCHITECTURE AND NO SIGNIFICANT  HISTOPATHOLOGIC CHANGE.  - SINGLE FRAGMENT OF COLONIC MUCOSA WITH NO SIGNIFICANT HISTOPATHOLOGIC  CHANGE.  - NEGATIVE FOR ACTIVE ILEITIS/COLITIS.  - NEGATIVE FOR GRANULOMA, DYSPLASIA, AND MALIGNANCY.   B. COLON, CECUM AND ASCENDING; COLD BIOPSY:  - CHRONIC COLITIS WITH MILD ACTIVITY (FOCAL CRYPTITIS AND SUPERFICIAL  ACTIVE MUCOSAL INFLAMMATION).  - NEGATIVE FOR GRANULOMA, DYSPLASIA, AND MALIGNANCY.   C. COLON, TRANSVERSE AND DESCENDING; COLD BIOPSY:  - CHRONIC COLITIS WITHOUT ACTIVITY.  - NEGATIVE FOR GRANULOMA, DYSPLASIA, AND MALIGNANCY.   D. COLON, RECTOSIGMOID; COLD BIOPSY:  - CHRONIC PROCTITIS WITHOUT ACTIVITY.  - NEGATIVE FOR GRANULOMA, DYSPLASIA, AND MALIGNANCY.    VCE 05/15/2022, study is incomplete,  capsule in the stomach Repeat video capsule endoscopy 06/24/2022 Study is incomplete, capsule did not reach cecum Capsule was placed endoscopically and second portion of duodenum Solitary nonbleeding small bowel AVM was found in the proximal small bowel  IBD medications:  Steroids: Responsive to prednisone, budesonide 5-ASA:  Treated with Apriso in the past  Immunomodulators: AZA, methotrexate nave TPMT status unknown Biologics:  Anti TNFs: Humira initiated on 06/30/2022, increased to every week in 09/2022 Anti Integrins: None Ustekinumab: None Tofactinib: None Clinical trial: None    NSAIDs: None  Antiplts/Anticoagulants/Anti thrombotics: None   Past Medical History:  Diagnosis Date   Actinic keratosis    Anemia    LAST HGB 15.2 ON 04-22-16   Anxiety    Arthritis    Cancer (HCC) 2016   skin   Cataract (lens) fragments in eye following cataract surgery, bilateral 2013   one eye done then 4 weeks later the other eye done.   Chronic ulcerative colitis, without complications (HCC) 10/29/2015   Diverticulitis 2018   Esophagitis, reflux 10/04/2014   Family history of adverse reaction to anesthesia    SISTER HARD TO WAKE UP and nausea and vomiting.   Female stress incontinence 08/09/2013   Gastroesophageal reflux disease without esophagitis 08/06/2016   GERD (gastroesophageal reflux disease)    Headache    MIGRAINE   History of hiatal hernia    Hypertension    Hyperthyroidism    Incomplete emptying of bladder 10/29/2013   Migraine 07/26/2013   Pneumonia due to COVID-19 virus 2020   PONV (postoperative nausea and vomiting)    nausea, does not remember vomiting.   Postherpetic neuralgia    RIGHT 7TH.CRANIAL NERVE from shingles followed by Bell's palsy   Postmenopausal 11/09/2016   PVC (premature ventricular contraction)    Scoliosis    Scoliosis (and kyphoscoliosis), idiopathic 07/26/2013   Scoliosis of lumbosacral spine 01/18/2015   Squamous cell carcinoma of skin 03/13/2013   L clavicle    Squamous cell carcinoma of skin 03/10/2018   L med knee   Squamous cell carcinoma of skin 02/21/2019   R cheek    Squamous cell carcinoma of skin 06/01/2019   L mid lat pretibial - ED&C   Trochanteric bursitis 11/29/2014   Ulcerative colitis (HCC)    Urethral prolapse 08/09/2013   Urge incontinence  08/09/2013    Past Surgical History:  Procedure Laterality Date   ABDOMINAL HYSTERECTOMY     BACK SURGERY  03/12/2021   L3-L4 AND L3-S1 DECOMPRESSION   CARPAL TUNNEL RELEASE Bilateral    CATARACT EXTRACTION W/ INTRAOCULAR LENS  IMPLANT, BILATERAL     CHOLECYSTECTOMY N/A 08/21/2016   Procedure: LAPAROSCOPIC CHOLECYSTECTOMY WITH INTRAOPERATIVE CHOLANGIOGRAM;  Surgeon: Nadeen Landau, MD;  Location: [facility] ORS;  Service: General;  Laterality: N/A;   COLONOSCOPY  11/06/2005   COLONOSCOPY  2010   COLONOSCOPY  2013   COLONOSCOPY WITH PROPOFOL N/A 07/29/2016   Procedure: COLONOSCOPY WITH PROPOFOL;  Surgeon: Scot Jun, MD;  Location: [facility] ENDOSCOPY;  Service: Endoscopy;  Laterality: N/A;   COLONOSCOPY WITH PROPOFOL N/A 08/22/2020   Procedure: COLONOSCOPY WITH PROPOFOL;  Surgeon: Pasty Spillers, MD;  Location: [facility] ENDOSCOPY;  Service: Endoscopy;  Laterality: N/A;   COLONOSCOPY WITH PROPOFOL N/A 01/22/2022   Procedure: COLONOSCOPY WITH PROPOFOL;  Surgeon: Toney Reil, MD;  Location: [facility] SURGERY CNTR;  Service: Endoscopy;  Laterality: N/A;   COLONOSCOPY WITH PROPOFOL N/A 07/23/2022   Procedure: COLONOSCOPY WITH PROPOFOL;  Surgeon: Toney Reil, MD;  Location: [facility]GERY [facility];  Service: Endoscopy;  Laterality: N/A;   ESOPHAGOGASTRODUODENOSCOPY  11/06/2005   ESOPHAGOGASTRODUODENOSCOPY N/A 06/24/2022   Procedure: ESOPHAGOGASTRODUODENOSCOPY (EGD);  Surgeon: Toney Reil, MD;  Location: [facility] ENDOSCOPY;  Service: Gastroenterology;  Laterality: N/A;   ESOPHAGOGASTRODUODENOSCOPY (EGD) WITH PROPOFOL N/A 07/29/2016   Procedure: ESOPHAGOGASTRODUODENOSCOPY (EGD) WITH PROPOFOL;  Surgeon: Scot Jun, MD;  Location: [facility] ENDOSCOPY;  Service: Endoscopy;  Laterality: N/A;   ESOPHAGOGASTRODUODENOSCOPY (EGD) WITH PROPOFOL N/A 02/07/2020   Procedure: ESOPHAGOGASTRODUODENOSCOPY (EGD) WITH PROPOFOL;  Surgeon: Pasty Spillers, MD;  Location: [facility] ENDOSCOPY;   Service: Endoscopy;  Laterality: N/A;   ESOPHAGOGASTRODUODENOSCOPY (EGD) WITH PROPOFOL N/A 01/22/2022   Procedure: ESOPHAGOGASTRODUODENOSCOPY (EGD) WITH PROPOFOL;  Surgeon: Toney Reil, MD;  Location: [facility] SURGERY CNTR;  Service: Endoscopy;  Laterality: N/A;   EYE SURGERY     GIVENS CAPSULE STUDY N/A 05/07/2022   Procedure: GIVENS CAPSULE STUDY;  Surgeon: Toney Reil, MD;  Location: [facility] ENDOSCOPY;  Service: Gastroenterology;  Laterality: N/A;   GIVENS CAPSULE STUDY N/A 06/24/2022   Procedure: GIVENS CAPSULE STUDY;  Surgeon: Toney Reil, MD;  Location: [facility] ENDOSCOPY;  Service: Gastroenterology;  Laterality: N/A;   KNEE ARTHROPLASTY Right 05/13/2022   Procedure: COMPUTER ASSISTED TOTAL KNEE ARTHROPLASTY;  Surgeon: Donato Heinz, MD;  Location: [facility] ORS;  Service: Orthopedics;  Laterality: Right;   LAPAROSCOPIC LOW ANTERIOR RESECTION  11/24/2016   Procedure: LAPAROSCOPIC LOW ANTERIOR RESECTION;  Surgeon: Leafy Ro, MD;  Location: [facility] ORS;  Service: General;;   LUMBAR LAMINECTOMY  2004   L3-4   PARTIAL COLECTOMY  2018   POLYPECTOMY  01/22/2022   Procedure: POLYPECTOMY;  Surgeon: Toney Reil, MD;  Location: [facility] SURGERY CNTR;  Service: Endoscopy;;   POSTERIOR LAMINECTOMY / DECOMPRESSION LUMBAR SPINE  2009   L3-S1   TONSILLECTOMY     XI ROBOTIC ASSISTED PARAESOPHAGEAL HERNIA REPAIR N/A 02/27/2020   Procedure: XI ROBOTIC ASSISTED PARAESOPHAGEAL HERNIA REPAIR;  Surgeon: Leafy Ro, MD;  Location: [facility] ORS;  Service: General;  Laterality: N/A;     Current Outpatient Medications:    acetaminophen (TYLENOL) 650 MG CR tablet, Take 650 mg by mouth 2 (two) times daily., Disp: , Rfl:    adalimumab (HUMIRA, 2 PEN,) 40 MG/0.4ML pen, Inject 0.4 mLs (40 mg total) into the skin every 7 (seven) days., Disp: 4 each, Rfl: 12   amoxicillin (AMOXIL) 500 MG capsule, Take 2,000 mg by mouth as needed (Take before dental appts)., Disp: , Rfl:    ascorbic Acid (VITAMIN  C) 500 MG CPCR, 500 mg once daily, Disp: , Rfl:    budesonide (ENTOCORT EC) 3 MG 24 hr capsule, Take 3 capsules (9 mg total) by mouth daily., Disp: 90 capsule, Rfl: 0   DULoxetine (CYMBALTA) 60 MG capsule, Take 60 mg by mouth at bedtime. , Disp: , Rfl: 3   ferrous gluconate (FERGON) 324 MG tablet, Take 1 tablet by mouth daily with breakfast., Disp: , Rfl:    furosemide (LASIX) 20 MG tablet, Take 20 mg by mouth daily as needed., Disp: , Rfl:    meclizine (ANTIVERT) 25 MG tablet, Take 1 tablet (25 mg total) by mouth 3 (three) times daily as needed for dizziness or nausea., Disp: 30 tablet, Rfl: 0   methimazole (TAPAZOLE) 5 MG tablet, Take 1 tablet (5 mg total) by mouth 2 (two) times daily. (Patient taking differently: Take 5 mg by mouth daily. M-W-F 5 mg T-Th-Sat-Sun 2.5 mg), Disp: , Rfl:    pantoprazole (PROTONIX) 40 MG tablet,  Take 40 mg by mouth 2 (two) times daily., Disp: , Rfl:    polyethylene glycol powder (GLYCOLAX/MIRALAX) 17 GM/SCOOP powder, Take 255 g by mouth daily., Disp: 238 g, Rfl: 0   triamterene-hydrochlorothiazide (DYAZIDE) 37.5-25 MG capsule, Take 1 capsule by mouth daily., Disp: , Rfl:    Family History  Problem Relation Age of Onset   Breast cancer Sister 98   Breast cancer Paternal Aunt 25   Breast cancer Cousin        1st paternal cousin   Bladder Cancer Neg Hx    Prostate cancer Neg Hx    Kidney cancer Neg Hx      Social History   Tobacco Use   Smoking status: Never   Smokeless tobacco: Never  Vaping Use   Vaping status: Never Used  Substance Use Topics   Alcohol use: No   Drug use: No    Allergies as of 02/03/2023 - Review Complete 02/03/2023  Allergen Reaction Noted   Hydromorphone Other (See Comments) 08/11/2021   Nsaids Other (See Comments) 01/05/2020   Sulfa antibiotics Rash 08/24/2015    Review of Systems:    All systems reviewed and negative except where noted in HPI.   Physical Exam:  BP 135/84 (BP Location: Left Arm, Patient Position:  Sitting, Cuff Size: Normal)   Pulse 80   Temp 97.7 F (36.5 C) (Oral)   Ht 5\' 5"  (1.651 m)   Wt 168 lb 6 oz (76.4 kg)   BMI 28.02 kg/m  No LMP recorded. Patient has had a hysterectomy.  General:   Alert,  Well-developed, well-nourished, pleasant and cooperative in NAD Head:  Normocephalic and atraumatic. Eyes:  Sclera clear, no icterus.   Conjunctiva pink. Ears:  Normal auditory acuity. Nose:  No deformity, discharge, or lesions. Mouth:  No deformity or lesions,oropharynx pink & moist. Neck:  Supple; no masses or thyromegaly. Lungs:  Respirations even and unlabored.  Clear throughout to auscultation.   No wheezes, crackles, or rhonchi. No acute distress. Heart:  Regular rate and rhythm; no murmurs, clicks, rubs, or gallops. Abdomen:  Normal bowel sounds. Soft, non-tender and non-distended without masses, hepatosplenomegaly or hernias noted.  No guarding or rebound tenderness.   Rectal: Not performed Msk:  Symmetrical without gross deformities. Good, equal movement & strength bilaterally. Pulses:  Normal pulses noted. Extremities:  No clubbing or edema.  No cyanosis. Neurologic:  Alert and oriented x3;  grossly normal neurologically. Skin:  Intact without significant lesions or rashes. No jaundice. Psych:  Alert and cooperative. Normal mood and affect.  Imaging Studies: No recent abdominal imaging  Assessment and Plan:   April Larsen is a 77 y.o. female with longstanding history of ulcerative colitis, maintained on mesalamine, lumbar scoliosis s/p lumbar fusion in December 2022, large hiatal hernia s/p repair, sigmoid diverticulitis s/p sigmoid colectomy in 2018 is seen in consultation for ulcerative pan colitis and history of iron deficiency anemia  Ulcerative colitis: Not in remission since her back surgery in mid 2023 Elevated fecal calprotectin levels which have normalized as of 01/13/2023 Stool studies negative for infection including C. difficile Colonoscopy revealed  chronic mild active pan colitis in 12/2021 Initiated Humira on 06/30/2022 with standard induction and maintenance dose biweekly.  Given ongoing diarrhea and breakthrough symptoms 1 week before the next shot, subtherapeutic Humira levels and significantly elevated fecal calprotectin levels, Humira has been increased to weekly since 09/2022 Started on budesonide 9 mg daily followed by taper after she attained clinical remission Another flareup in October  2024, stool studies negative for infection, restarted budesonide which helped improving diarrhea Humira trough levels are therapeutic and undetectable antibodies  Check CBC, CMP every 3 months  She is up-to-date with vaccinations, due for third dose of Twinrix vaccine  Iron deficiency anemia as well as B12 deficiency: Resolved EGD and colonoscopy did not reveal source of anemia First video capsule endoscopy, capsule did not leave stomach.  Subsequently, underwent endoscopic placement of video capsule on 06/24/2022.  Revealed a solitary small nonbleeding AVM in the proximal small bowel.  Capsule did not reach cecum.  Serial x-rays revealed retained capsule in the cecum which is finally evacuated spontaneously. Most recent serum ferritin was 9, B12 206 in 07/2022 Continue oral iron every other day and daily B12 Recheck iron panel and B12 levels  Follow up in 3 to 4 months   [name], MD

## 2023-02-03 NOTE — Telephone Encounter (Signed)
Patient was seen today and we schedule patient for colonoscopy to decide to if she still need the medication or not. Redid the application for the 2025 new year and faxed to the patient assistance company

## 2023-02-15 ENCOUNTER — Other Ambulatory Visit: Payer: Self-pay | Admitting: Gastroenterology

## 2023-02-15 ENCOUNTER — Other Ambulatory Visit: Payer: Self-pay | Admitting: Neurosurgery

## 2023-02-15 DIAGNOSIS — M542 Cervicalgia: Secondary | ICD-10-CM | POA: Diagnosis not present

## 2023-02-15 DIAGNOSIS — M4722 Other spondylosis with radiculopathy, cervical region: Secondary | ICD-10-CM

## 2023-02-16 DIAGNOSIS — E0590 Thyrotoxicosis, unspecified without thyrotoxic crisis or storm: Secondary | ICD-10-CM | POA: Diagnosis not present

## 2023-02-16 NOTE — Telephone Encounter (Signed)
Called to check on the application and they said they received the application and at this time they were not missing no information but they would not finish the enrollment process till after 03/08/2023 when medicare open enrollment closes and patients have there new medicare plans and changes.

## 2023-02-16 NOTE — Telephone Encounter (Signed)
Last office visit 02/03/2023 UC  Last refill 01/13/23 90 0 refills  Plan Started on budesonide 9 mg daily followed by taper after she attained clinical remission Another flareup in October 2024, stool studies negative for infection, restarted budesonide which helped improving diarrhea Humira trough levels are therapeutic and undetectable antibodies  Check CBC, CMP every 3 months

## 2023-02-21 ENCOUNTER — Ambulatory Visit
Admission: RE | Admit: 2023-02-21 | Discharge: 2023-02-21 | Disposition: A | Discharge status: Home / Self Care | Payer: PPO | Source: Ambulatory Visit | Attending: Neurosurgery | Admitting: Neurosurgery

## 2023-02-21 DIAGNOSIS — M4802 Spinal stenosis, cervical region: Secondary | ICD-10-CM | POA: Diagnosis not present

## 2023-02-21 DIAGNOSIS — M4722 Other spondylosis with radiculopathy, cervical region: Secondary | ICD-10-CM | POA: Insufficient documentation

## 2023-02-21 DIAGNOSIS — G8929 Other chronic pain: Secondary | ICD-10-CM | POA: Diagnosis not present

## 2023-02-21 DIAGNOSIS — M47812 Spondylosis without myelopathy or radiculopathy, cervical region: Secondary | ICD-10-CM | POA: Diagnosis not present

## 2023-02-21 DIAGNOSIS — M5023 Other cervical disc displacement, cervicothoracic region: Secondary | ICD-10-CM | POA: Diagnosis not present

## 2023-03-01 ENCOUNTER — Encounter
Admission: RE | Disposition: A | Discharge status: Home / Self Care | Payer: Self-pay | Source: Home / Self Care | Attending: Gastroenterology

## 2023-03-01 ENCOUNTER — Ambulatory Visit: Payer: PPO | Admitting: Anesthesiology

## 2023-03-01 ENCOUNTER — Ambulatory Visit
Admission: RE | Admit: 2023-03-01 | Discharge: 2023-03-01 | Disposition: A | Discharge status: Home / Self Care | Payer: PPO | Attending: Gastroenterology | Admitting: Gastroenterology

## 2023-03-01 DIAGNOSIS — Z9889 Other specified postprocedural states: Secondary | ICD-10-CM | POA: Insufficient documentation

## 2023-03-01 DIAGNOSIS — Z8616 Personal history of COVID-19: Secondary | ICD-10-CM | POA: Insufficient documentation

## 2023-03-01 DIAGNOSIS — K635 Polyp of colon: Secondary | ICD-10-CM | POA: Diagnosis not present

## 2023-03-01 DIAGNOSIS — Z1211 Encounter for screening for malignant neoplasm of colon: Secondary | ICD-10-CM | POA: Diagnosis not present

## 2023-03-01 DIAGNOSIS — I10 Essential (primary) hypertension: Secondary | ICD-10-CM | POA: Diagnosis not present

## 2023-03-01 DIAGNOSIS — K5100 Ulcerative (chronic) pancolitis without complications: Secondary | ICD-10-CM | POA: Diagnosis not present

## 2023-03-01 DIAGNOSIS — K649 Unspecified hemorrhoids: Secondary | ICD-10-CM | POA: Diagnosis not present

## 2023-03-01 DIAGNOSIS — K529 Noninfective gastroenteritis and colitis, unspecified: Secondary | ICD-10-CM | POA: Diagnosis not present

## 2023-03-01 DIAGNOSIS — Z79899 Other long term (current) drug therapy: Secondary | ICD-10-CM | POA: Insufficient documentation

## 2023-03-01 DIAGNOSIS — Z85828 Personal history of other malignant neoplasm of skin: Secondary | ICD-10-CM | POA: Insufficient documentation

## 2023-03-01 DIAGNOSIS — K219 Gastro-esophageal reflux disease without esophagitis: Secondary | ICD-10-CM | POA: Insufficient documentation

## 2023-03-01 DIAGNOSIS — K6389 Other specified diseases of intestine: Secondary | ICD-10-CM | POA: Diagnosis not present

## 2023-03-01 DIAGNOSIS — K644 Residual hemorrhoidal skin tags: Secondary | ICD-10-CM | POA: Insufficient documentation

## 2023-03-01 DIAGNOSIS — D123 Benign neoplasm of transverse colon: Secondary | ICD-10-CM | POA: Diagnosis not present

## 2023-03-01 DIAGNOSIS — Z980 Intestinal bypass and anastomosis status: Secondary | ICD-10-CM | POA: Diagnosis not present

## 2023-03-01 DIAGNOSIS — F419 Anxiety disorder, unspecified: Secondary | ICD-10-CM | POA: Insufficient documentation

## 2023-03-01 HISTORY — PX: COLONOSCOPY WITH PROPOFOL: SHX5780

## 2023-03-01 HISTORY — PX: POLYPECTOMY: SHX5525

## 2023-03-01 HISTORY — PX: BIOPSY: SHX5522

## 2023-03-01 SURGERY — COLONOSCOPY WITH PROPOFOL
Anesthesia: General

## 2023-03-01 MED ORDER — STERILE WATER FOR IRRIGATION IR SOLN
Status: DC | PRN
  Administered 2023-03-01: 60 mL

## 2023-03-01 MED ORDER — SODIUM CHLORIDE 0.9 % IV SOLN: INTRAVENOUS | Status: DC

## 2023-03-01 MED ORDER — PROPOFOL 10 MG/ML IV BOLUS
INTRAVENOUS | Status: DC | PRN
  Administered 2023-03-01: 150 ug/kg/min via INTRAVENOUS
  Administered 2023-03-01: 80 mg via INTRAVENOUS

## 2023-03-01 MED ORDER — LIDOCAINE HCL (CARDIAC) PF 100 MG/5ML IV SOSY
PREFILLED_SYRINGE | INTRAVENOUS | Status: DC | PRN
  Administered 2023-03-01: 50 mg via INTRAVENOUS

## 2023-03-01 NOTE — Op Note (Signed)
[facility]enterology Patient Name: April Larsen Procedure Date: 03/01/2023 11:31 AM MRN: [id number] Account #: [id number] Date of Birth: 12/08/1945 Admit Type: Outpatient Age: 77 Room: [facility] ENDO ROOM 4 Gender: Female Note Status: Finalized Instrument Name: Colonoscope [id number] Procedure:             Colonoscopy Indications:           Last colonoscopy: April 2024, Follow-up of chronic                         ulcerative pancolitis, Disease activity assessment of                         chronic ulcerative pancolitis, Assess therapeutic                         response to therapy of chronic ulcerative pancolitis Providers:             Toney Reil MD, MD Referring MD:          Danella Penton, MD (Referring MD) Medicines:             General Anesthesia Complications:         No immediate complications. Estimated blood loss: None. Procedure:             Pre-Anesthesia Assessment:                        - Prior to the procedure, a History and Physical was                         performed, and patient medications and allergies were                         reviewed. The patient is competent. The risks and                         benefits of the procedure and the sedation options and                         risks were discussed with the patient. All questions                         were answered and informed consent was obtained.                         Patient identification and proposed procedure were                         verified by the physician, the nurse, the                         anesthesiologist, the anesthetist and the technician                         in the pre-procedure area in the procedure room in the                         endoscopy suite. Mental Status Examination: alert and  oriented. Airway Examination: normal oropharyngeal                         airway and neck mobility. Respiratory Examination:                          clear to auscultation. CV Examination: normal.                         Prophylactic Antibiotics: The patient does not require                         prophylactic antibiotics. Prior Anticoagulants: The                         patient has taken no anticoagulant or antiplatelet                         agents. ASA Grade Assessment: III - A patient with                         severe systemic disease. After reviewing the risks and                         benefits, the patient was deemed in satisfactory                         condition to undergo the procedure. The anesthesia                         plan was to use general anesthesia. Immediately prior                         to administration of medications, the patient was                         re-assessed for adequacy to receive sedatives. The                         heart rate, respiratory rate, oxygen saturations,                         blood pressure, adequacy of pulmonary ventilation, and                         response to care were monitored throughout the                         procedure. The physical status of the patient was                         re-assessed after the procedure.                        After obtaining informed consent, the colonoscope was                         passed under direct vision. Throughout the procedure,  the patient's blood pressure, pulse, and oxygen                         saturations were monitored continuously. The                         Colonoscope was introduced through the anus and                         advanced to the the terminal ileum, with                         identification of the appendiceal orifice and IC                         valve. The colonoscopy was performed without                         difficulty. The patient tolerated the procedure well.                         The quality of the bowel preparation was fair. The                          terminal ileum, ileocecal valve, appendiceal orifice,                         and rectum were photographed. Findings:      The perianal and digital rectal examinations were normal. Pertinent       negatives include normal sphincter tone and no palpable rectal lesions.      The terminal ileum appeared normal.      An area of mildly congested mucosa was found in the descending colon, in       the transverse colon, in the ascending colon and in the cecum. Biopsies       were taken with a cold forceps for histology. Estimated blood loss: none.      The rectum, recto-sigmoid colon and sigmoid colon appeared normal.       Biopsies were taken with a cold forceps for histology.      There was evidence of a prior end-to-side colo-rectal anastomosis in the       recto-sigmoid colon. This was patent and was characterized by healthy       appearing mucosa. The anastomosis was traversed.      Non-bleeding external hemorrhoids were found during retroflexion. The       hemorrhoids were large. Impression:            - Preparation of the colon was fair.                        - The examined portion of the ileum was normal.                        - Congested mucosa in the descending colon, in the                         transverse colon, in the ascending colon and in the  cecum. Biopsied.                        - The rectum, sigmoid colon and recto-sigmoid colon                         are normal. Biopsied.                        - Patent end-to-side colo-rectal anastomosis,                         characterized by healthy appearing mucosa.                        - Non-bleeding external hemorrhoids. Recommendation:        - Discharge patient to home (with escort).                        - Resume previous diet today.                        - Continue present medications.                        - Await pathology results.                        - Return to my office as previously  scheduled. Procedure Code(s):     --- Professional ---                        [phone], Colonoscopy, flexible; with biopsy, single or                         multiple Diagnosis Code(s):     --- Professional ---                        K64.4, Residual hemorrhoidal skin tags                        K63.89, Other specified diseases of intestine                        K51.00, Ulcerative (chronic) pancolitis without                         complications                        Z98.0, Intestinal bypass and anastomosis status CPT copyright 2022 [employer]. All rights reserved. The codes documented in this report are preliminary and upon coder review may  be revised to meet current compliance requirements. Dr. Libby Maw Toney Reil MD, MD 03/01/2023 12:09:50 PM This report has been signed electronically. Number of Addenda: 0 Note Initiated On: 03/01/2023 11:31 AM Scope Withdrawal Time: 0 hours 22 minutes 0 seconds  Total Procedure Duration: 0 hours 23 minutes 53 seconds  Estimated Blood Loss:  Estimated blood loss: none.      [facility]

## 2023-03-01 NOTE — Transfer of Care (Signed)
Immediate Anesthesia Transfer of Care Note  Patient: April Larsen  Procedure(s) Performed: COLONOSCOPY WITH PROPOFOL BIOPSY POLYPECTOMY  Patient Location: Endoscopy Unit  Anesthesia Type:General  Level of Consciousness: awake  Airway & Oxygen Therapy: Patient Spontanous Breathing  Post-op Assessment: Report given to RN and Post -op Vital signs reviewed and stable  Post vital signs: Reviewed  Last Vitals:  Vitals Value Taken Time  BP 111/70 03/01/23 1212  Temp    Pulse 85 03/01/23 1212  Resp 18 03/01/23 1212  SpO2 100 % 03/01/23 1212  Vitals shown include unfiled device data.  Last Pain:  Vitals:   03/01/23 1046  TempSrc: Temporal         Complications: No notable events documented.

## 2023-03-01 NOTE — Anesthesia Procedure Notes (Signed)
Date/Time: 03/01/2023 11:38 AM  Performed by: Ginger Carne, CRNAPre-anesthesia Checklist: Patient identified, Emergency Drugs available, Suction available, Patient being monitored and Timeout performed Patient Re-evaluated:Patient Re-evaluated prior to induction Oxygen Delivery Method: Nasal cannula Preoxygenation: Pre-oxygenation with 100% oxygen Induction Type: IV induction

## 2023-03-01 NOTE — Anesthesia Postprocedure Evaluation (Signed)
Anesthesia Post Note  Patient: April Larsen  Procedure(s) Performed: COLONOSCOPY WITH PROPOFOL BIOPSY POLYPECTOMY  Patient location during evaluation: Endoscopy Anesthesia Type: General Level of consciousness: awake and alert Pain management: pain level controlled Vital Signs Assessment: post-procedure vital signs reviewed and stable Respiratory status: spontaneous breathing, nonlabored ventilation, respiratory function stable and patient connected to nasal cannula oxygen Cardiovascular status: blood pressure returned to baseline and stable Postop Assessment: no apparent nausea or vomiting Anesthetic complications: no   No notable events documented.   Last Vitals:  Vitals:   03/01/23 1212 03/01/23 1213  BP: 111/70   Pulse: 60   Resp: 18   Temp:  36.9 C  SpO2: 99%     Last Pain:  Vitals:   03/01/23 1233  TempSrc:   PainSc: 0-No pain                 Cleda Mccreedy Makaelyn Aponte

## 2023-03-01 NOTE — Anesthesia Preprocedure Evaluation (Signed)
Anesthesia Evaluation  Patient identified by MRN, date of birth, ID band Patient awake    Reviewed: Allergy & Precautions, NPO status , Patient's Chart, lab work & pertinent test results  History of Anesthesia Complications (+) PONV and history of anesthetic complications  Airway Mallampati: III  TM Distance: <3 FB Neck ROM: full    Dental  (+) Chipped   Pulmonary neg pulmonary ROS, neg shortness of breath   Pulmonary exam normal        Cardiovascular Exercise Tolerance: Good hypertension, (-) angina Normal cardiovascular exam     Neuro/Psych  Headaches PSYCHIATRIC DISORDERS       Neuromuscular disease    GI/Hepatic Neg liver ROS, PUD,GERD  Controlled,,  Endo/Other   Hyperthyroidism   Renal/GU negative Renal ROS  negative genitourinary   Musculoskeletal   Abdominal   Peds  Hematology negative hematology ROS (+)   Anesthesia Other Findings Past Medical History: No date: Actinic keratosis No date: Anemia     Comment:  LAST HGB 15.2 ON 04-22-16 No date: Anxiety No date: Arthritis 2016: [facility])     Comment:  skin 2013: Cataract (lens) fragments in eye following cataract surgery,  bilateral     Comment:  one eye done then 4 weeks later the other eye done. 10/29/2015: Chronic ulcerative colitis, without complications (HCC) 2018: Diverticulitis 10/04/2014: Esophagitis, reflux No date: Family history of adverse reaction to anesthesia     Comment:  SISTER HARD TO WAKE UP and nausea and vomiting. 08/09/2013: Female stress incontinence 08/06/2016: Gastroesophageal reflux disease without esophagitis No date: GERD (gastroesophageal reflux disease) No date: Headache     Comment:  MIGRAINE No date: History of hiatal hernia No date: Hypertension No date: Hyperthyroidism 10/29/2013: Incomplete emptying of bladder 07/26/2013: Migraine 2020: Pneumonia due to COVID-19 virus No date: PONV (postoperative nausea and  vomiting)     Comment:  nausea, does not remember vomiting. No date: Postherpetic neuralgia     Comment:  RIGHT 7TH.CRANIAL NERVE from shingles followed by Bell's              palsy 11/09/2016: Postmenopausal No date: PVC (premature ventricular contraction) No date: Scoliosis 07/26/2013: Scoliosis (and kyphoscoliosis), idiopathic 01/18/2015: Scoliosis of lumbosacral spine 03/13/2013: Squamous cell carcinoma of skin     Comment:  L clavicle  03/10/2018: Squamous cell carcinoma of skin     Comment:  L med knee 02/21/2019: Squamous cell carcinoma of skin     Comment:  R cheek  06/01/2019: Squamous cell carcinoma of skin     Comment:  L mid lat pretibial - ED&C 11/29/2014: Trochanteric bursitis No date: Ulcerative colitis (HCC) 08/09/2013: Urethral prolapse 08/09/2013: Urge incontinence  Past Surgical History: No date: ABDOMINAL HYSTERECTOMY 03/12/2021: BACK SURGERY     Comment:  L3-L4 AND L3-S1 DECOMPRESSION No date: CARPAL TUNNEL RELEASE; Bilateral No date: CATARACT EXTRACTION W/ INTRAOCULAR LENS  IMPLANT, BILATERAL 08/21/2016: CHOLECYSTECTOMY; N/A     Comment:  Procedure: LAPAROSCOPIC CHOLECYSTECTOMY WITH               INTRAOPERATIVE CHOLANGIOGRAM;  Surgeon: Nadeen Landau, MD;  Location: [facility] ORS;  Service: General;                Laterality: N/A; 11/06/2005: COLONOSCOPY 2010: COLONOSCOPY 2013: COLONOSCOPY 07/29/2016: COLONOSCOPY WITH PROPOFOL; N/A     Comment:  Procedure: COLONOSCOPY WITH PROPOFOL;  Surgeon: Wilber Bihari  Mechele Collin, MD;  Location: [facility] ENDOSCOPY;  Service:               Endoscopy;  Laterality: N/A; 08/22/2020: COLONOSCOPY WITH PROPOFOL; N/A     Comment:  Procedure: COLONOSCOPY WITH PROPOFOL;  Surgeon:               Pasty Spillers, MD;  Location: [facility] ENDOSCOPY;                Service: Endoscopy;  Laterality: N/A; 01/22/2022: COLONOSCOPY WITH PROPOFOL; N/A     Comment:  Procedure: COLONOSCOPY WITH PROPOFOL;  Surgeon:  Toney Reil, MD;  Location: [facility];                Service: Endoscopy;  Laterality: N/A; 07/23/2022: COLONOSCOPY WITH PROPOFOL; N/A     Comment:  Procedure: COLONOSCOPY WITH PROPOFOL;  Surgeon: Toney Reil, MD;  Location: [facility];                Service: Endoscopy;  Laterality: N/A; 11/06/2005: ESOPHAGOGASTRODUODENOSCOPY 06/24/2022: ESOPHAGOGASTRODUODENOSCOPY; N/A     Comment:  Procedure: ESOPHAGOGASTRODUODENOSCOPY (EGD);  Surgeon:               Toney Reil, MD;  Location: [facility] ENDOSCOPY;                Service: Gastroenterology;  Laterality: N/A; 07/29/2016: ESOPHAGOGASTRODUODENOSCOPY (EGD) WITH PROPOFOL; N/A     Comment:  Procedure: ESOPHAGOGASTRODUODENOSCOPY (EGD) WITH               PROPOFOL;  Surgeon: Scot Jun, MD;  Location: [facility]              ENDOSCOPY;  Service: Endoscopy;  Laterality: N/A; 02/07/2020: ESOPHAGOGASTRODUODENOSCOPY (EGD) WITH PROPOFOL; N/A     Comment:  Procedure: ESOPHAGOGASTRODUODENOSCOPY (EGD) WITH               PROPOFOL;  Surgeon: Pasty Spillers, MD;  Location:               [facility] ENDOSCOPY;  Service: Endoscopy;  Laterality: N/A; 01/22/2022: ESOPHAGOGASTRODUODENOSCOPY (EGD) WITH PROPOFOL; N/A     Comment:  Procedure: ESOPHAGOGASTRODUODENOSCOPY (EGD) WITH               PROPOFOL;  Surgeon: Toney Reil, MD;  Location:               [facility];  Service: Endoscopy;  Laterality:               N/A; No date: EYE SURGERY 05/07/2022: GIVENS CAPSULE STUDY; N/A     Comment:  Procedure: GIVENS CAPSULE STUDY;  Surgeon: Toney Reil, MD;  Location: [facility] ENDOSCOPY;  Service:               Gastroenterology;  Laterality: N/A; 06/24/2022: GIVENS CAPSULE STUDY; N/A     Comment:  Procedure: GIVENS CAPSULE STUDY;  Surgeon: Toney Reil, MD;  Location: [facility] ENDOSCOPY;  Service:               Gastroenterology;  Laterality: N/A; 05/13/2022:  KNEE ARTHROPLASTY; Right     Comment:  Procedure: COMPUTER ASSISTED TOTAL KNEE ARTHROPLASTY;  Surgeon: Donato Heinz, MD;  Location: [facility] ORS;                Service: Orthopedics;  Laterality: Right; 11/24/2016: LAPAROSCOPIC LOW ANTERIOR RESECTION     Comment:  Procedure: LAPAROSCOPIC LOW ANTERIOR RESECTION;                Surgeon: Leafy Ro, MD;  Location: [facility] ORS;                Service: General;; 2004: LUMBAR LAMINECTOMY     Comment:  L3-4 2018: PARTIAL COLECTOMY 01/22/2022: POLYPECTOMY     Comment:  Procedure: POLYPECTOMY;  Surgeon: Toney Reil,               MD;  Location: [facility];  Service: Endoscopy;; 2009: POSTERIOR LAMINECTOMY / DECOMPRESSION LUMBAR SPINE     Comment:  L3-S1 No date: TONSILLECTOMY 02/27/2020: XI ROBOTIC ASSISTED PARAESOPHAGEAL HERNIA REPAIR; N/A     Comment:  Procedure: XI ROBOTIC ASSISTED PARAESOPHAGEAL HERNIA               REPAIR;  Surgeon: Leafy Ro, MD;  Location: [facility]               ORS;  Service: General;  Laterality: N/A;  BMI    Body Mass Index: 27.79 kg/m      Reproductive/Obstetrics negative OB ROS                             Anesthesia Physical Anesthesia Plan  ASA: 3  Anesthesia Plan: General   Post-op Pain Management:    Induction: Intravenous  PONV Risk Score and Plan: Propofol infusion and TIVA  Airway Management Planned: Natural Airway and Nasal Cannula  Additional Equipment:   Intra-op Plan:   Post-operative Plan:   Informed Consent: I have reviewed the patients History and Physical, chart, labs and discussed the procedure including the risks, benefits and alternatives for the proposed anesthesia with the patient or authorized representative who has indicated his/her understanding and acceptance.     Dental Advisory Given  Plan Discussed with: Anesthesiologist, CRNA and Surgeon  Anesthesia Plan Comments: (Patient consented for risks of anesthesia  including but not limited to:  - adverse reactions to medications - risk of airway placement if required - damage to eyes, teeth, lips or other oral mucosa - nerve damage due to positioning  - sore throat or hoarseness - Damage to heart, brain, nerves, lungs, other parts of body or loss of life  Patient voiced understanding and assent.)       Anesthesia Quick Evaluation

## 2023-03-01 NOTE — H&P (Signed)
[name], MD [address]  [address]  [place], [place] [address]  Main: [phone]  Fax: [phone] Pager: [phone]  Primary Care Physician:  Danella Penton, MD Primary Gastroenterologist:  Dr. [name]  Pre-Procedure History & Physical: HPI:  April Larsen is a 77 y.o. female is here for an colonoscopy.   Past Medical History:  Diagnosis Date   Actinic keratosis    Anemia    LAST HGB 15.2 ON 04-22-16   Anxiety    Arthritis    Cancer (HCC) 2016   skin   Cataract (lens) fragments in eye following cataract surgery, bilateral 2013   one eye done then 4 weeks later the other eye done.   Chronic ulcerative colitis, without complications (HCC) 10/29/2015   Diverticulitis 2018   Esophagitis, reflux 10/04/2014   Family history of adverse reaction to anesthesia    SISTER HARD TO WAKE UP and nausea and vomiting.   Female stress incontinence 08/09/2013   Gastroesophageal reflux disease without esophagitis 08/06/2016   GERD (gastroesophageal reflux disease)    Headache    MIGRAINE   History of hiatal hernia    Hypertension    Hyperthyroidism    Incomplete emptying of bladder 10/29/2013   Migraine 07/26/2013   Pneumonia due to COVID-19 virus 2020   PONV (postoperative nausea and vomiting)    nausea, does not remember vomiting.   Postherpetic neuralgia    RIGHT 7TH.CRANIAL NERVE from shingles followed by Bell's palsy   Postmenopausal 11/09/2016   PVC (premature ventricular contraction)    Scoliosis    Scoliosis (and kyphoscoliosis), idiopathic 07/26/2013   Scoliosis of lumbosacral spine 01/18/2015   Squamous cell carcinoma of skin 03/13/2013   L clavicle    Squamous cell carcinoma of skin 03/10/2018   L med knee   Squamous cell carcinoma of skin 02/21/2019   R cheek    Squamous cell carcinoma of skin 06/01/2019   L mid lat pretibial - ED&C   Trochanteric bursitis 11/29/2014   Ulcerative colitis (HCC)    Urethral prolapse 08/09/2013   Urge  incontinence 08/09/2013    Past Surgical History:  Procedure Laterality Date   ABDOMINAL HYSTERECTOMY     BACK SURGERY  03/12/2021   L3-L4 AND L3-S1 DECOMPRESSION   CARPAL TUNNEL RELEASE Bilateral    CATARACT EXTRACTION W/ INTRAOCULAR LENS  IMPLANT, BILATERAL     CHOLECYSTECTOMY N/A 08/21/2016   Procedure: LAPAROSCOPIC CHOLECYSTECTOMY WITH INTRAOPERATIVE CHOLANGIOGRAM;  Surgeon: Nadeen Landau, MD;  Location: [facility] ORS;  Service: General;  Laterality: N/A;   COLONOSCOPY  11/06/2005   COLONOSCOPY  2010   COLONOSCOPY  2013   COLONOSCOPY WITH PROPOFOL N/A 07/29/2016   Procedure: COLONOSCOPY WITH PROPOFOL;  Surgeon: Scot Jun, MD;  Location: [facility] ENDOSCOPY;  Service: Endoscopy;  Laterality: N/A;   COLONOSCOPY WITH PROPOFOL N/A 08/22/2020   Procedure: COLONOSCOPY WITH PROPOFOL;  Surgeon: Pasty Spillers, MD;  Location: [facility] ENDOSCOPY;  Service: Endoscopy;  Laterality: N/A;   COLONOSCOPY WITH PROPOFOL N/A 01/22/2022   Procedure: COLONOSCOPY WITH PROPOFOL;  Surgeon: Toney Reil, MD;  Location: [facility];  Service: Endoscopy;  Laterality: N/A;   COLONOSCOPY WITH PROPOFOL N/A 07/23/2022   Procedure: COLONOSCOPY WITH PROPOFOL;  Surgeon: Toney Reil, MD;  Location: [facility];  Service: Endoscopy;  Laterality: N/A;   ESOPHAGOGASTRODUODENOSCOPY  11/06/2005   ESOPHAGOGASTRODUODENOSCOPY N/A 06/24/2022   Procedure: ESOPHAGOGASTRODUODENOSCOPY (EGD);  Surgeon: Toney Reil, MD;  Location: [facility] ENDOSCOPY;  Service: Gastroenterology;  Laterality: N/A;   ESOPHAGOGASTRODUODENOSCOPY (EGD) WITH PROPOFOL N/A 07/29/2016   Procedure: ESOPHAGOGASTRODUODENOSCOPY (EGD) WITH PROPOFOL;  Surgeon: Scot Jun, MD;  Location: [facility] ENDOSCOPY;  Service: Endoscopy;  Laterality: N/A;   ESOPHAGOGASTRODUODENOSCOPY (EGD) WITH PROPOFOL N/A 02/07/2020   Procedure: ESOPHAGOGASTRODUODENOSCOPY (EGD) WITH PROPOFOL;  Surgeon: Pasty Spillers, MD;  Location: [facility]  ENDOSCOPY;  Service: Endoscopy;  Laterality: N/A;   ESOPHAGOGASTRODUODENOSCOPY (EGD) WITH PROPOFOL N/A 01/22/2022   Procedure: ESOPHAGOGASTRODUODENOSCOPY (EGD) WITH PROPOFOL;  Surgeon: Toney Reil, MD;  Location: [facility];  Service: Endoscopy;  Laterality: N/A;   EYE SURGERY     GIVENS CAPSULE STUDY N/A 05/07/2022   Procedure: GIVENS CAPSULE STUDY;  Surgeon: Toney Reil, MD;  Location: [facility] ENDOSCOPY;  Service: Gastroenterology;  Laterality: N/A;   GIVENS CAPSULE STUDY N/A 06/24/2022   Procedure: GIVENS CAPSULE STUDY;  Surgeon: Toney Reil, MD;  Location: [facility] ENDOSCOPY;  Service: Gastroenterology;  Laterality: N/A;   KNEE ARTHROPLASTY Right 05/13/2022   Procedure: COMPUTER ASSISTED TOTAL KNEE ARTHROPLASTY;  Surgeon: Donato Heinz, MD;  Location: [facility] ORS;  Service: Orthopedics;  Laterality: Right;   LAPAROSCOPIC LOW ANTERIOR RESECTION  11/24/2016   Procedure: LAPAROSCOPIC LOW ANTERIOR RESECTION;  Surgeon: Leafy Ro, MD;  Location: [facility] ORS;  Service: General;;   LUMBAR LAMINECTOMY  2004   L3-4   PARTIAL COLECTOMY  2018   POLYPECTOMY  01/22/2022   Procedure: POLYPECTOMY;  Surgeon: Toney Reil, MD;  Location: [facility];  Service: Endoscopy;;   POSTERIOR LAMINECTOMY / DECOMPRESSION LUMBAR SPINE  2009   L3-S1   TONSILLECTOMY     XI ROBOTIC ASSISTED PARAESOPHAGEAL HERNIA REPAIR N/A 02/27/2020   Procedure: XI ROBOTIC ASSISTED PARAESOPHAGEAL HERNIA REPAIR;  Surgeon: Leafy Ro, MD;  Location: [facility] ORS;  Service: General;  Laterality: N/A;    Prior to Admission medications   Medication Sig Start Date End Date Taking? Authorizing Provider  budesonide (ENTOCORT EC) 3 MG 24 hr capsule TAKE 3 CAPSULES BY MOUTH ONCE DAILY 02/16/23  Yes Mysha Peeler, Loel Dubonnet, MD  DULoxetine (CYMBALTA) 60 MG capsule Take 60 mg by mouth at bedtime.  06/17/15  Yes [provider]  pantoprazole (PROTONIX) 40 MG tablet Take 40 mg by mouth 2 (two) times  daily. 08/21/22  Yes [provider]  acetaminophen (TYLENOL) 650 MG CR tablet Take 650 mg by mouth 2 (two) times daily.    [provider]  adalimumab (HUMIRA, 2 PEN,) 40 MG/0.4ML pen Inject 0.4 mLs (40 mg total) into the skin every 7 (seven) days. 10/05/22   Toney Reil, MD  amoxicillin (AMOXIL) 500 MG capsule Take 2,000 mg by mouth as needed (Take before dental appts).    [provider]  ascorbic Acid (VITAMIN C) 500 MG CPCR 500 mg once daily    [provider]  ferrous gluconate (FERGON) 324 MG tablet Take 1 tablet by mouth daily with breakfast. 10/02/22   [provider]  furosemide (LASIX) 20 MG tablet Take 20 mg by mouth daily as needed. Patient not taking: Reported on 02/22/2023 08/19/21   [provider]  meclizine (ANTIVERT) 25 MG tablet Take 1 tablet (25 mg total) by mouth 3 (three) times daily as needed for dizziness or nausea. 06/23/21   Tresa Moore, MD  methimazole (TAPAZOLE) 5 MG tablet Take 1 tablet (5 mg total) by mouth 2 (two) times daily. Patient taking differently: Take 5 mg by mouth daily. M-W-F 5 mg T-Th-Sat-Sun 2.5 mg 07/02/21   Joen Laura  P, MD  polyethylene glycol powder (GLYCOLAX/MIRALAX) 17 GM/SCOOP powder Take 255 g by mouth daily. Patient not taking: Reported on 02/22/2023 07/20/22   Toney Reil, MD  triamterene-hydrochlorothiazide (DYAZIDE) 37.5-25 MG capsule Take 1 capsule by mouth daily.    [provider]    Allergies as of 02/03/2023 - Review Complete 02/03/2023  Allergen Reaction Noted   Hydromorphone Other (See Comments) 08/11/2021   Nsaids Other (See Comments) 01/05/2020   Sulfa antibiotics Rash 08/24/2015    Family History  Problem Relation Age of Onset   Breast cancer Sister 80   Breast cancer Paternal Aunt 41   Breast cancer Cousin        1st paternal cousin   Bladder Cancer Neg Hx    Prostate cancer Neg Hx    Kidney cancer Neg Hx     Social History    Socioeconomic History   Marital status: Divorced    Spouse name: Not on file   Number of children: 2   Years of education: Not on file   Highest education level: Not on file  Occupational History   Not on file  Tobacco Use   Smoking status: Never   Smokeless tobacco: Never  Vaping Use   Vaping status: Never Used  Substance and Sexual Activity   Alcohol use: No   Drug use: No   Sexual activity: Not on file  Other Topics Concern   Not on file  Social History Narrative   Son lives with her   Social Determinants of Health   Financial Resource Strain: Low Risk  (12/22/2022)   Received from [facility]   Overall Financial Resource Strain (CARDIA)    Difficulty of Paying Living Expenses: Not hard at all  Food Insecurity: No Food Insecurity (12/22/2022)   Received from [facility]   Hunger Vital Sign    Worried About Running Out of Food in the Last Year: Never true    Ran Out of Food in the Last Year: Never true  Transportation Needs: No Transportation Needs (12/22/2022)   Received from [facility] - Transportation    In the past 12 months, has lack of transportation kept you from medical appointments or from getting medications?: No    Lack of Transportation (Non-Medical): No  Physical Activity: Not on file  Stress: Not on file  Social Connections: Not on file  Intimate Partner Violence: Not At Risk (05/13/2022)   Humiliation, Afraid, Rape, and Kick questionnaire    Fear of Current or Ex-Partner: No    Emotionally Abused: No    Physically Abused: No    Sexually Abused: No    Review of Systems: See HPI, otherwise negative ROS  Physical Exam: BP 123/75   Pulse 61   Temp 97.6 F (36.4 C) (Temporal)   Resp 18   Ht 5\' 5"  (1.651 m)   Wt 75.8 kg   SpO2 97%   BMI 27.79 kg/m  General:   Alert,  pleasant and cooperative in NAD Head:  Normocephalic and atraumatic. Neck:  Supple; no masses or  thyromegaly. Lungs:  Clear throughout to auscultation.    Heart:  Regular rate and rhythm. Abdomen:  Soft, nontender and nondistended. Normal bowel sounds, without guarding, and without rebound.   Neurologic:  Alert and  oriented x4;  grossly normal neurologically.  Impression/Plan: WRETHA LIEB is here for an colonoscopy to be performed for ulcerative colitis  Risks, benefits, limitations, and alternatives regarding  colonoscopy have been reviewed with the patient.  Questions have been answered.  All parties agreeable.   Lannette Donath, MD  03/01/2023, 10:59 AM

## 2023-03-02 ENCOUNTER — Encounter: Payer: Self-pay | Admitting: Gastroenterology

## 2023-03-02 ENCOUNTER — Telehealth: Payer: Self-pay | Admitting: Gastroenterology

## 2023-03-02 LAB — SURGICAL PATHOLOGY

## 2023-03-02 NOTE — Telephone Encounter (Signed)
Patient states she got a email from what she thinks is the patient assistance company and she does not know what it means. Informed her to come by our office and I would take a look at it. She states she will do that this afternoon.

## 2023-03-02 NOTE — Telephone Encounter (Signed)
Patient called in she has some questions about he (humira) she has got a Physicist, medical in the mail.

## 2023-03-04 ENCOUNTER — Telehealth: Payer: Self-pay

## 2023-03-04 MED ORDER — CHOLESTYRAMINE 4 G PO PACK: 4.0000 g | PACK | Freq: Two times a day (BID) | ORAL | 0 refills | Status: DC

## 2023-03-04 NOTE — Telephone Encounter (Signed)
Patient verbalized understanding of results and she will stop the Budesonide. She states will go pick up it up at the pharmacy

## 2023-03-04 NOTE — Telephone Encounter (Signed)
-----   Message from [facility] sent at 03/04/2023  3:54 PM EST ----- Please inform patient that the pathology results from recent colonoscopy shows that her colitis is inactive which is good.  I recommend to continue current dose of Humira and try cholestyramine 2 g 1 to 2 packets daily separating from other medications instead of budesonide.  She should let us know if the cholestyramine helps.  Give her a prescription for 2 weeks only  RV

## 2023-03-10 ENCOUNTER — Telehealth: Payer: Self-pay | Admitting: Gastroenterology

## 2023-03-10 NOTE — Telephone Encounter (Signed)
Patient was calling because she receive a text message from [employer] patient assistance to reorder her Humira. She called them on Monday and told them she was not approved yet for 2025 and they told her that was okay and they were going to deliver the medication on the 20th. Patient wanted to make sure she has not messed up. Informed her she has not messed up because she is approved through 03/30/2023 so she can place a order till the end of the year and she can still receive it. She states that makes her feels so much better.

## 2023-03-10 NOTE — Telephone Encounter (Signed)
Pt requesting call back has question inreference  to humuria

## 2023-03-18 ENCOUNTER — Telehealth: Payer: Self-pay | Admitting: Gastroenterology

## 2023-03-18 MED ORDER — CHOLESTYRAMINE 4 G PO PACK: 4.0000 g | PACK | Freq: Two times a day (BID) | ORAL | 2 refills | Status: DC

## 2023-03-18 NOTE — Telephone Encounter (Signed)
Patient is calling Cholestyranine and the 2 weeks will be up this week.  With the medication she is having 2 to 3 form bowel movements a day. Denies any diarrhea. Patient is wanting to know what she should do. If she needs to keep taking the medication or stop

## 2023-03-18 NOTE — Telephone Encounter (Signed)
Called to check the status of the application they said they have all the information but unable to tell me how much longer the application was going to take. She just kept saying they have 7 to 10 business days to process it and they started processing all applications on 03/08/2023. This is 9 business days of processing. Will call again on Monday

## 2023-03-18 NOTE — Telephone Encounter (Signed)
Pt requesting call back to discuss continuing her medication cholestyranine

## 2023-03-18 NOTE — Telephone Encounter (Signed)
Patient verbalized understanding of instructions  

## 2023-03-18 NOTE — Addendum Note (Signed)
Addended by: Radene Knee L on: 03/18/2023 04:25 PM   Modules accepted: Orders

## 2023-03-22 NOTE — Telephone Encounter (Signed)
Patient application is still under review

## 2023-03-26 DIAGNOSIS — M4712 Other spondylosis with myelopathy, cervical region: Secondary | ICD-10-CM | POA: Diagnosis not present

## 2023-03-28 DIAGNOSIS — Z03818 Encounter for observation for suspected exposure to other biological agents ruled out: Secondary | ICD-10-CM | POA: Diagnosis not present

## 2023-03-28 DIAGNOSIS — J069 Acute upper respiratory infection, unspecified: Secondary | ICD-10-CM | POA: Diagnosis not present

## 2023-03-29 ENCOUNTER — Telehealth: Payer: Self-pay | Admitting: Gastroenterology

## 2023-03-29 NOTE — Telephone Encounter (Signed)
The patient called in and left a voicemail requesting for Morrie Sheldon to call her back. She is needing surgery and she want to discuss her shots. I called the patient back to let her know we got her message and I inform her that the provider and nurse is on vacation. I sent a message to the nurse. She just needs to know how she needs to be off the shot.

## 2023-04-02 NOTE — Telephone Encounter (Signed)
 Medication has been approved through 03/29/2024

## 2023-04-02 NOTE — Telephone Encounter (Signed)
 Patient states she might be having neck surgery because she is having a lot of neck issues. She states she wants to know if she will have to come off the Humira  if she has the surgery. She wants to know if she does how long she has to be off the medication. She wants to know if you recommend this surgery.

## 2023-04-05 NOTE — Telephone Encounter (Signed)
 Patient verbalized understanding of instructions

## 2023-04-09 ENCOUNTER — Other Ambulatory Visit: Payer: Self-pay | Admitting: Neurosurgery

## 2023-04-13 ENCOUNTER — Telehealth: Payer: Self-pay

## 2023-04-13 DIAGNOSIS — H60502 Unspecified acute noninfective otitis externa, left ear: Secondary | ICD-10-CM | POA: Diagnosis not present

## 2023-04-13 DIAGNOSIS — N3090 Cystitis, unspecified without hematuria: Secondary | ICD-10-CM | POA: Diagnosis not present

## 2023-04-13 DIAGNOSIS — E0590 Thyrotoxicosis, unspecified without thyrotoxic crisis or storm: Secondary | ICD-10-CM | POA: Diagnosis not present

## 2023-04-13 NOTE — Telephone Encounter (Signed)
 Patient verbalized understanding and states she will let us know if she has any flare up. She states she appreciates everything we do for her. She states she was supposed to take the shot today but did not because of surgery

## 2023-04-13 NOTE — Telephone Encounter (Signed)
 Patient left a voicemail on the main line and states she would like Pattye Meda to call her back because she wants to give Dr. Unk some information about her upcoming surgery.  She states the Neurosurgery called and got her surgery schedule for 05/13/2023. She states that they told her he wants her off the Humria 4 weeks. She states she is so scared she is going to have a flared up being off the medication. He has not told her how long he wants her off of it after the surgery.  She states her nurse Shannon is calling on 05/06/2023 she states that she always ask her how she is doing on the medicine and the lot number she is taking. She wants to know should she tell her she is off the medication right now because she is having surgery or what should she tell her. She said she is due to order medication again she could give her the lot number for the medication she receives in the mail. She states she does not want to mess up and not be able to get the humira  free any more.

## 2023-04-19 DIAGNOSIS — E0590 Thyrotoxicosis, unspecified without thyrotoxic crisis or storm: Secondary | ICD-10-CM | POA: Diagnosis not present

## 2023-04-19 DIAGNOSIS — M5010 Cervical disc disorder with radiculopathy, unspecified cervical region: Secondary | ICD-10-CM | POA: Diagnosis not present

## 2023-04-19 DIAGNOSIS — K5190 Ulcerative colitis, unspecified, without complications: Secondary | ICD-10-CM | POA: Diagnosis not present

## 2023-04-22 ENCOUNTER — Other Ambulatory Visit: Payer: Self-pay | Admitting: Neurosurgery

## 2023-04-30 DIAGNOSIS — D2262 Melanocytic nevi of left upper limb, including shoulder: Secondary | ICD-10-CM | POA: Diagnosis not present

## 2023-04-30 DIAGNOSIS — D225 Melanocytic nevi of trunk: Secondary | ICD-10-CM | POA: Diagnosis not present

## 2023-04-30 DIAGNOSIS — Z85828 Personal history of other malignant neoplasm of skin: Secondary | ICD-10-CM | POA: Diagnosis not present

## 2023-04-30 DIAGNOSIS — L570 Actinic keratosis: Secondary | ICD-10-CM | POA: Diagnosis not present

## 2023-04-30 DIAGNOSIS — D2261 Melanocytic nevi of right upper limb, including shoulder: Secondary | ICD-10-CM | POA: Diagnosis not present

## 2023-04-30 DIAGNOSIS — D2272 Melanocytic nevi of left lower limb, including hip: Secondary | ICD-10-CM | POA: Diagnosis not present

## 2023-04-30 DIAGNOSIS — D485 Neoplasm of uncertain behavior of skin: Secondary | ICD-10-CM | POA: Diagnosis not present

## 2023-04-30 DIAGNOSIS — D0471 Carcinoma in situ of skin of right lower limb, including hip: Secondary | ICD-10-CM | POA: Diagnosis not present

## 2023-05-06 ENCOUNTER — Other Ambulatory Visit: Payer: Self-pay | Admitting: Neurosurgery

## 2023-05-06 NOTE — Pre-Procedure Instructions (Signed)
 Surgical Instructions   Your procedure is scheduled on Thursday, February 13th. Report to [facility] Main Entrance A at 08:35 A.M., then check in with the Admitting office. Any questions or running late day of surgery: call [phone]  Questions prior to your surgery date: call [phone], Monday-Friday, 8am-4pm. If you experience any cold or flu symptoms such as cough, fever, chills, shortness of breath, etc. between now and your scheduled surgery, please notify us  at the above number.     Remember:  Do not eat or drink after midnight the night before your surgery    Take these medicines the morning of surgery with A SIP OF WATER   methimazole  (TAPAZOLE )  pantoprazole  (PROTONIX )    May take these medicines IF NEEDED: acetaminophen  (TYLENOL )  fluticasone (ALLERGY RELIEF)  meclizine  (ANTIVERT )    *Stop Humira  4 weeks prior to surgery*   One week prior to surgery, STOP taking any Aspirin (unless otherwise instructed by your surgeon) Aleve, Naproxen, Ibuprofen, Motrin, Advil, Goody's, BC's, all herbal medications, fish oil, and non-prescription vitamins.                     Do NOT Smoke (Tobacco/Vaping) for 24 hours prior to your procedure.  If you use a CPAP at night, you may bring your mask/headgear for your overnight stay.   You will be asked to remove any contacts, glasses, piercing's, hearing aid's, dentures/partials prior to surgery. Please bring cases for these items if needed.    Patients discharged the day of surgery will not be allowed to drive home, and someone needs to stay with them for 24 hours.  SURGICAL WAITING ROOM VISITATION Patients may have no more than 2 support people in the waiting area - these visitors may rotate.   Pre-op nurse will coordinate an appropriate time for 1 ADULT support person, who may not rotate, to accompany patient in pre-op.  Children under the age of 15 must have an adult with them who is not the patient and must remain in the  main waiting area with an adult.  If the patient needs to stay at the hospital during part of their recovery, the visitor guidelines for inpatient rooms apply.  Please refer to the [facility] website for the visitor guidelines for any additional information.   If you received a COVID test during your pre-op visit  it is requested that you wear a mask when out in public, stay away from anyone that may not be feeling well and notify your surgeon if you develop symptoms. If you have been in contact with anyone that has tested positive in the last 10 days please notify you surgeon.      Pre-operative 5 CHG Bathing Instructions   You can play a key role in reducing the risk of infection after surgery. Your skin needs to be as free of germs as possible. You can reduce the number of germs on your skin by washing with CHG (chlorhexidine  gluconate) soap before surgery. CHG is an antiseptic soap that kills germs and continues to kill germs even after washing.   DO NOT use if you have an allergy to chlorhexidine /CHG or antibacterial soaps. If your skin becomes reddened or irritated, stop using the CHG and notify one of our RNs at [phone].   Please shower with the CHG soap starting 4 days before surgery using the following schedule:     Please keep in mind the following:  DO NOT shave, including legs and underarms, starting the  day of your first shower.   You may shave your face at any point before/day of surgery.  Place clean sheets on your bed the day you start using CHG soap. Use a clean washcloth (not used since being washed) for each shower. DO NOT sleep with pets once you start using the CHG.   CHG Shower Instructions:  Wash your face and private area with normal soap. If you choose to wash your hair, wash first with your normal shampoo.  After you use shampoo/soap, rinse your hair and body thoroughly to remove shampoo/soap residue.  Turn the water  OFF and apply about 3 tablespoons  (45 ml) of CHG soap to a CLEAN washcloth.  Apply CHG soap ONLY FROM YOUR NECK DOWN TO YOUR TOES (washing for 3-5 minutes)  DO NOT use CHG soap on face, private areas, open wounds, or sores.  Pay special attention to the area where your surgery is being performed.  If you are having back surgery, having someone wash your back for you may be helpful. Wait 2 minutes after CHG soap is applied, then you may rinse off the CHG soap.  Pat dry with a clean towel  Put on clean clothes/pajamas   If you choose to wear lotion, please use ONLY the CHG-compatible lotions that are listed below.  Additional instructions for the day of surgery: DO NOT APPLY any lotions, deodorants, cologne, or perfumes.   Do not bring valuables to the hospital. [facility] is not responsible for any belongings/valuables. Do not wear nail polish, gel polish, artificial nails, or any other type of covering on natural nails (fingers and toes) Do not wear jewelry or makeup Put on clean/comfortable clothes.  Please brush your teeth.  Ask your nurse before applying any prescription medications to the skin.     CHG Compatible Lotions   Aveeno Moisturizing lotion  Cetaphil Moisturizing Cream  Cetaphil Moisturizing Lotion  Clairol Herbal Essence Moisturizing Lotion, Dry Skin  Clairol Herbal Essence Moisturizing Lotion, Extra Dry Skin  Clairol Herbal Essence Moisturizing Lotion, Normal Skin  Curel Age Defying Therapeutic Moisturizing Lotion with Alpha Hydroxy  Curel Extreme Care Body Lotion  Curel Soothing Hands Moisturizing Hand Lotion  Curel Therapeutic Moisturizing Cream, Fragrance-Free  Curel Therapeutic Moisturizing Lotion, Fragrance-Free  Curel Therapeutic Moisturizing Lotion, Original Formula  Eucerin Daily Replenishing Lotion  Eucerin Dry Skin Therapy Plus Alpha Hydroxy Crme  Eucerin Dry Skin Therapy Plus Alpha Hydroxy Lotion  Eucerin Original Crme  Eucerin Original Lotion  Eucerin Plus Crme Eucerin Plus  Lotion  Eucerin TriLipid Replenishing Lotion  Keri Anti-Bacterial Hand Lotion  Keri Deep Conditioning Original Lotion Dry Skin Formula Softly Scented  Keri Deep Conditioning Original Lotion, Fragrance Free Sensitive Skin Formula  Keri Lotion Fast Absorbing Fragrance Free Sensitive Skin Formula  Keri Lotion Fast Absorbing Softly Scented Dry Skin Formula  Keri Original Lotion  Keri Skin Renewal Lotion Keri Silky Smooth Lotion  Keri Silky Smooth Sensitive Skin Lotion  Nivea Body Creamy Conditioning Oil  Nivea Body Extra Enriched Lotion  Nivea Body Original Lotion  Nivea Body Sheer Moisturizing Lotion Nivea Crme  Nivea Skin Firming Lotion  NutraDerm 30 Skin Lotion  NutraDerm Skin Lotion  NutraDerm Therapeutic Skin Cream  NutraDerm Therapeutic Skin Lotion  ProShield Protective Hand Cream  Provon moisturizing lotion  Please read over the following fact sheets that you were given.

## 2023-05-07 ENCOUNTER — Encounter (HOSPITAL_COMMUNITY)
Admission: RE | Admit: 2023-05-07 | Discharge: 2023-05-07 | Disposition: A | Discharge status: Home / Self Care | Payer: PPO | Source: Ambulatory Visit | Attending: Neurosurgery | Admitting: Neurosurgery

## 2023-05-07 ENCOUNTER — Encounter (HOSPITAL_COMMUNITY): Payer: Self-pay

## 2023-05-07 ENCOUNTER — Other Ambulatory Visit: Payer: Self-pay

## 2023-05-07 VITALS — BP 110/78 | HR 88 | Temp 98.1°F | Resp 18 | Ht 66.0 in | Wt 171.4 lb

## 2023-05-07 DIAGNOSIS — I2510 Atherosclerotic heart disease of native coronary artery without angina pectoris: Secondary | ICD-10-CM | POA: Insufficient documentation

## 2023-05-07 DIAGNOSIS — Z79899 Other long term (current) drug therapy: Secondary | ICD-10-CM | POA: Insufficient documentation

## 2023-05-07 DIAGNOSIS — Z01818 Encounter for other preprocedural examination: Secondary | ICD-10-CM | POA: Insufficient documentation

## 2023-05-07 DIAGNOSIS — E0590 Thyrotoxicosis, unspecified without thyrotoxic crisis or storm: Secondary | ICD-10-CM | POA: Insufficient documentation

## 2023-05-07 DIAGNOSIS — K5190 Ulcerative colitis, unspecified, without complications: Secondary | ICD-10-CM | POA: Diagnosis not present

## 2023-05-07 DIAGNOSIS — M4722 Other spondylosis with radiculopathy, cervical region: Secondary | ICD-10-CM | POA: Insufficient documentation

## 2023-05-07 DIAGNOSIS — M4712 Other spondylosis with myelopathy, cervical region: Secondary | ICD-10-CM | POA: Insufficient documentation

## 2023-05-07 DIAGNOSIS — I10 Essential (primary) hypertension: Secondary | ICD-10-CM | POA: Insufficient documentation

## 2023-05-07 DIAGNOSIS — K219 Gastro-esophageal reflux disease without esophagitis: Secondary | ICD-10-CM | POA: Diagnosis not present

## 2023-05-07 LAB — CBC
HCT: 42.1 % (ref 36.0–46.0)
Hemoglobin: 14.2 g/dL (ref 12.0–15.0)
MCH: 31.6 pg (ref 26.0–34.0)
MCHC: 33.7 g/dL (ref 30.0–36.0)
MCV: 93.6 fL (ref 80.0–100.0)
Platelets: 333 10*3/uL (ref 150–400)
RBC: 4.5 MIL/uL (ref 3.87–5.11)
RDW: 12.7 % (ref 11.5–15.5)
WBC: 9.9 10*3/uL (ref 4.0–10.5)
nRBC: 0 % (ref 0.0–0.2)

## 2023-05-07 LAB — SURGICAL PCR SCREEN
MRSA, PCR: NEGATIVE
Staphylococcus aureus: NEGATIVE

## 2023-05-07 LAB — BASIC METABOLIC PANEL
Anion gap: 10 (ref 5–15)
BUN: 26 mg/dL — ABNORMAL HIGH (ref 8–23)
CO2: 23 mmol/L (ref 22–32)
Calcium: 10 mg/dL (ref 8.9–10.3)
Chloride: 104 mmol/L (ref 98–111)
Creatinine, Ser: 0.92 mg/dL (ref 0.44–1.00)
GFR, Estimated: >60 mL/min (ref >60)
Glucose, Bld: 99 mg/dL (ref 70–99)
Potassium: 4.1 mmol/L (ref 3.5–5.1)
Sodium: 137 mmol/L (ref 135–145)

## 2023-05-07 LAB — TYPE AND SCREEN
ABO/RH(D): O POS
Antibody Screen: NEGATIVE

## 2023-05-07 NOTE — Progress Notes (Signed)
 PCP - Dr. Oneil Pinal Cardiologist - Dr. Wolm Rhyme (Pt actually saw PA Alan Grumet) 12/23/2020 for CP. Testing r/o cardiac cause. No follow-up needed per pt  PPM/ICD - Denies Device Orders - n/a Rep Notified - n/a  Chest x-ray - n/a EKG - 05/07/2023 Stress Test - 06/12/2016 ECHO - 06/28/2021 Cardiac Cath - Denies  Sleep Study - Denies CPAP - n/a  No DM  Last dose of GLP1 agonist- n/a GLP1 instructions: n/a  Blood Thinner Instructions: n/a Aspirin Instructions: n/a  NPO after midnight  COVID TEST- n/a   Anesthesia review: Yes. Hx of HTN with cardiac testing completed within the past three years (Bubble study echo in 2023 showed Moderate Tricuspid Regurgitation), Ulcerative Colitis for which pt takes Humira  (instructed to hold 4 weeks. Last dose was January 14th) and EKG review.  Patient denies shortness of breath, fever, cough and chest pain at PAT appointment. Pt denies any respiratory illness/infection in the last two months.   All instructions explained to the patient, with a verbal understanding of the material. Patient agrees to go over the instructions while at home for a better understanding. Patient also instructed to self quarantine after being tested for COVID-19. The opportunity to ask questions was provided.

## 2023-05-10 NOTE — Anesthesia Preprocedure Evaluation (Addendum)
Anesthesia Evaluation  Patient identified by MRN, date of birth, ID band Patient awake    Reviewed: Allergy & Precautions, H&P , NPO status , Patient's Chart, lab work & pertinent test results  History of Anesthesia Complications (+) PONV and history of anesthetic complications  Airway Mallampati: II  TM Distance: >3 FB Neck ROM: Full    Dental no notable dental hx. (+) Teeth Intact, Dental Advisory Given   Pulmonary neg pulmonary ROS   Pulmonary exam normal breath sounds clear to auscultation       Cardiovascular hypertension, Pt. on medications  Rhythm:Regular Rate:Normal     Neuro/Psych  Headaches  Anxiety Depression   Dementia    GI/Hepatic Neg liver ROS, hiatal hernia, PUD,GERD  Medicated,,  Endo/Other  negative endocrine ROS    Renal/GU negative Renal ROS  negative genitourinary   Musculoskeletal  (+) Arthritis ,    Abdominal   Peds  Hematology  (+) Blood dyscrasia, anemia   Anesthesia Other Findings   Reproductive/Obstetrics negative OB ROS                             Anesthesia Physical Anesthesia Plan  ASA: 2  Anesthesia Plan: General   Post-op Pain Management: Ofirmev IV (intra-op)*   Induction: Intravenous  PONV Risk Score and Plan: 4 or greater and Ondansetron, Dexamethasone and Treatment may vary due to age or medical condition  Airway Management Planned: Oral ETT  Additional Equipment:   Intra-op Plan:   Post-operative Plan: Extubation in OR  Informed Consent: I have reviewed the patients History and Physical, chart, labs and discussed the procedure including the risks, benefits and alternatives for the proposed anesthesia with the patient or authorized representative who has indicated his/her understanding and acceptance.     Dental advisory given  Plan Discussed with: CRNA  Anesthesia Plan Comments: (PAT note written 05/10/2023 by Shonna Chock,  PA-C.  )       Anesthesia Quick Evaluation

## 2023-05-10 NOTE — Progress Notes (Signed)
 Anesthesia Chart Review:  Case: [id number] Date/Time: 05/13/23 1150   Procedure: ACDF,IP,PLATE SCREWS R65,R54,R43,R32   Anesthesia type: General   Pre-op diagnosis: CERVICAL SPONDYLOSIS WITH MYELOPATHY AND RADICULOPATHY   [facility] / [facility]   Surgeons: Mavis Purchase, MD       DISCUSSION: Patient is a [age]-year-old female scheduled for the above procedure.  History includes never smoker, postoperative N/V, HTN, PVCs, hyperthyroidism (admission 06/2021, started on Tapazole ), ulcerative colitis, hiatal hernia/GERD (s/p robotic assisted laparoscopic Niesen fundoplication with repair of hiatal hernia 02/27/20), migraines, skin cancer, diverticulitis (s/p laparoscopic LOA, low anterior resection 11/24/16), osteoarthritis (right TKA 05/13/22), spinal surgery (left L3-4 decompression; right L2-3 laminotomy, L3-S1 decompressive laminectomy 06/10/07; T4-ilium fusion 03/12/21 for scoliosis), cholecystectomy (08/21/16).  Last visit with PCP Dr. Cleotilde was on 04/19/23. Thyroid  levels good on low dose Tapazole . Ulcerative colitis reasonably asymptomatic using cholestyramine  and off Entocort. (Also on Humira .) He notes plans for C3-7 fusion and hopes it will help her balance.  She is not currently followed by cardiology but has had previously evaluations at [facility] in [place] (Preoperative evaluations by Dr. Deatrice Cage in 2018 & Dr. Redell Cave in 2021) and more recently by Dr. Wolm Rhyme at [facility] in 2023 for dyspnea and chest pain felt most consistent with symptoms related to her hiatal hernia/indigestion in setting of severe scoliosis. She had a an exercise Myoview  on 12/16/20 that showed normal  treadmill EKG without evidence of ischemia or arrhythmia with normal myocardial perfusion. Echocardiogram on 12/16/2020 showed normal LV and RV systolic function, no valvular stenosis, mild TR, EF of 50%. No additional testing was recommended.   She had a more recent  echo on 06/28/21 when she was admitted with new onset hyperthyroidism. It showed LVEF 60 to 65%, no regional wall motion abnormalities, mild concentric LVH, grade 1 diastolic dysfunction, normal RV systolic function, mildly dilated LA/RA, mild MR, moderate TR.  Thyroid  US  was normal on 06/29/21. She is now on Tapazole  and followed by endocrinologist Dr. Cherilyn. As of 04/13/23 TSH normal at 2.93, Free T4 0.64 (0.66-1.14).  He denies shortness of breath, cough, fever, chest pain at PAT RN visit.  She is on Humira  for ulcerative colitis.  She reports instruction to hold 4 weeks prior to surgery, last dose 04/13/2023.  Anesthesia team to evaluate on the day of surgery.    VS: BP 110/78   Pulse 88   Temp 36.7 C   Resp 18   Ht 5' 6 (1.676 m)   Wt 77.7 kg   SpO2 98%   BMI 27.66 kg/m    PROVIDERS: Cleotilde Oneil FALCON, MD is PCP Avelina, [facility]) Cherilyn Ned, MD is endocrinologist Terryl, [facility]) Lateef, Munsoor, MD is nephrologist [facility]) Rhyme Wolm, MD is cardiologist. Reassuring testing on  Unk Cotton, MD is GI    LABS: Labs reviewed: Acceptable for surgery. (all labs ordered are listed, but only abnormal results are displayed)  Labs Reviewed  BASIC METABOLIC PANEL - Abnormal; Notable for the following components:      Result Value   BUN 26 (*)    All other components within normal limits  SURGICAL PCR SCREEN  CBC  TYPE AND SCREEN     IMAGES: MRI C-spine 02/21/23: IMPRESSION: 1. Diffuse cervical spine spondylosis as described above. 2. No acute osseous injury of the cervical spine.  US  Thyroid  06/29/21: IMPRESSION: 1. Normal-sized thyroid . No indication for biopsy or follow-up imaging. The above is in keeping with the ACR TI-RADS  recommendations - J Am Coll Radiol 2017;14:587-595.   EKG: 05/07/23: Normal sinus rhythm Low voltage QRS Inferior infarct , age undetermined Left axis deviation Abnormal ECG No significant change since last  tracing Confirmed by Raford Riggs ([id number]) on 05/07/2023 3:15:18 PM   CV: Echo 06/28/21 (in setting of newly diagnosed hyperthyroidism): IMPRESSIONS   1. Left ventricular ejection fraction, by estimation, is 60 to 65%. The  left ventricle has normal function. The left ventricle has no regional  wall motion abnormalities. There is mild concentric left ventricular  hypertrophy. Left ventricular diastolic  parameters are consistent with Grade I diastolic dysfunction (impaired  relaxation).   2. Right ventricular systolic function is normal. The right ventricular  size is normal.   3. Left atrial size was mildly dilated.   4. Right atrial size was mildly dilated.   5. The mitral valve is normal in structure. Mild mitral valve  regurgitation. No evidence of mitral stenosis.   6. Tricuspid valve regurgitation is moderate.   7. The aortic valve is normal in structure. Aortic valve regurgitation is  not visualized. No aortic stenosis is present.   8. The inferior vena cava is normal in size with greater than 50%  respiratory variability, suggesting right atrial pressure of 3 mmHg.  - Comparison 12/16/20 [facility]: Normal LV/RV systolic function, mild TR, EF 50%.   Nuclear stress test 12/16/20 ([facility]): IMPRESSION: Normal for EKG without evidence of ischemia or arrhythmia. LVEF 62%. Normal myocardial perfusion without evidence of myocardial ischemia    Past Medical History:  Diagnosis Date   Actinic keratosis    Anemia    Anxiety    Arthritis    Cancer (HCC) 2016   skin   Cataract (lens) fragments in eye following cataract surgery, bilateral 2013   one eye done then 4 weeks later the other eye done.   Chronic ulcerative colitis, without complications (HCC) 10/29/2015   Diverticulitis 2018   Esophagitis, reflux 10/04/2014   Family history of adverse reaction to anesthesia    SISTER HARD TO WAKE UP and nausea and vomiting.   Female stress incontinence 08/09/2013    Gastroesophageal reflux disease without esophagitis 08/06/2016   GERD (gastroesophageal reflux disease)    attributed to Hiatal Hernia   Headache    MIGRAINE   History of hiatal hernia    Hypertension    Hyperthyroidism    Incomplete emptying of bladder 10/29/2013   Migraine 07/26/2013   Pneumonia due to COVID-19 virus 2020   PONV (postoperative nausea and vomiting)    nausea, does not remember vomiting.   Postherpetic neuralgia    RIGHT 7TH.CRANIAL NERVE from shingles followed by Bell's palsy   Postmenopausal 11/09/2016   PVC (premature ventricular contraction)    Scoliosis    Scoliosis (and kyphoscoliosis), idiopathic 07/26/2013   Scoliosis of lumbosacral spine 01/18/2015   Squamous cell carcinoma of skin 03/13/2013   L clavicle    Squamous cell carcinoma of skin 03/10/2018   L med knee   Squamous cell carcinoma of skin 02/21/2019   R cheek    Squamous cell carcinoma of skin 06/01/2019   L mid lat pretibial - ED&C   Trochanteric bursitis 11/29/2014   Ulcerative colitis (HCC)    Urethral prolapse 08/09/2013   Urge incontinence 08/09/2013    Past Surgical History:  Procedure Laterality Date   ABDOMINAL HYSTERECTOMY     BACK SURGERY  03/12/2021   L3-L4 AND L3-S1 DECOMPRESSION   BIOPSY  03/01/2023   Procedure: BIOPSY;  Surgeon: Unk Corinn Skiff, MD;  Location: [facility] ENDOSCOPY;  Service: Gastroenterology;;   CARPAL TUNNEL RELEASE Bilateral    CATARACT EXTRACTION W/ INTRAOCULAR LENS  IMPLANT, BILATERAL     CHOLECYSTECTOMY N/A 08/21/2016   Procedure: LAPAROSCOPIC CHOLECYSTECTOMY WITH INTRAOPERATIVE CHOLANGIOGRAM;  Surgeon: Claudene Larinda Bolder, MD;  Location: [facility] ORS;  Service: General;  Laterality: N/A;   COLONOSCOPY  11/06/2005   COLONOSCOPY  2010   COLONOSCOPY  2013   COLONOSCOPY WITH PROPOFOL  N/A 07/29/2016   Procedure: COLONOSCOPY WITH PROPOFOL ;  Surgeon: Lamar ONEIDA Holmes, MD;  Location: [facility] END[facility]COPY;  Service: Endoscopy;  Laterality: N/A;   COLONOSCOPY WITH  PROPOFOL  N/A 08/22/2020   Procedure: COLONOSCOPY WITH PROPOFOL ;  Surgeon: Janalyn Keene NOVAK, MD;  Location: [facility] ENDOSCOPY;  Service: Endoscopy;  Laterality: N/A;   COLONOSCOPY WITH PROPOFOL  N/A 01/22/2022   Procedure: COLONOSCOPY WITH PROPOFOL ;  Surgeon: Unk Corinn Skiff, MD;  Location: [facility]ERY [facility]TR;  Service: Endoscopy;  Laterality: N/A;   COLONOSCOPY WITH PROPOFOL  N/A 07/23/2022   Procedure: COLONOSCOPY WITH PROPOFOL ;  Surgeon: Unk Corinn Skiff, MD;  Location: [facility];  Service: Endoscopy;  Laterality: N/A;   COLONOSCOPY WITH PROPOFOL  N/A 03/01/2023   Procedure: COLONOSCOPY WITH PROPOFOL ;  Surgeon: Unk Corinn Skiff, MD;  Location: [facility] ENDOSCOPY;  Service: Gastroenterology;  Laterality: N/A;   ESOPHAGOGASTRODUODENOSCOPY  11/06/2005   ESOPHAGOGASTRODUODENOSCOPY N/A 06/24/2022   Procedure: ESOPHAGOGASTRODUODENOSCOPY (EGD);  Surgeon: Unk Corinn Skiff, MD;  Location: [facility] END[facility]COPY;  Service: Gastroenterology;  Laterality: N/A;   ESOPHAGOGASTRODUODENOSCOPY (EGD) WITH PROPOFOL  N/A 07/29/2016   Procedure: ESOPHAGOGASTRODUODENOSCOPY (EGD) WITH PROPOFOL ;  Surgeon: Lamar ONEIDA Holmes, MD;  Location: [facility] ENDOSCOPY;  Service: Endoscopy;  Laterality: N/A;   ESOPHAGOGASTRODUODENOSCOPY (EGD) WITH PROPOFOL  N/A 02/07/2020   Procedure: ESOPHAGOGASTRODUODENOSCOPY (EGD) WITH PROPOFOL ;  Surgeon: Janalyn Keene NOVAK, MD;  Location: [facility] ENDOSCOPY;  Service: Endoscopy;  Laterality: N/A;   ESOPHAGOGASTRODUODENOSCOPY (EGD) WITH PROPOFOL  N/A 01/22/2022   Procedure: ESOPHAGOGASTRODUODENOSCOPY (EGD) WITH PROPOFOL ;  Surgeon: Unk Corinn Skiff, MD;  Location: [facility];  Service: Endoscopy;  Laterality: N/A;   EYE SURGERY     GIVENS CAPSULE STUDY N/A 05/07/2022   Procedure: GIVENS CAPSULE STUDY;  Surgeon: Unk Corinn Skiff, MD;  Location: [facility] ENDOSCOPY;  Service: Gastroenterology;  Laterality: N/A;   GIVENS CAPSULE STUDY N/A 06/24/2022   Procedure: GIVENS CAPSULE STUDY;   Surgeon: Unk Corinn Skiff, MD;  Location: [facility] ENDOSCOPY;  Service: Gastroenterology;  Laterality: N/A;   KNEE ARTHROPLASTY Right 05/13/2022   Procedure: COMPUTER ASSISTED TOTAL KNEE ARTHROPLASTY;  Surgeon: Mardee Lynwood SQUIBB, MD;  Location: [facility] ORS;  Service: Orthopedics;  Laterality: Right;   LAPAROSCOPIC LOW ANTERIOR RESECTION  11/24/2016   Procedure: LAPAROSCOPIC LOW ANTERIOR RESECTION;  Surgeon: Jordis Laneta FALCON, MD;  Location: [facility] ORS;  Service: General;;   LUMBAR LAMINECTOMY  2004   L3-4   PARTIAL COLECTOMY  2018   POLYPECTOMY  01/22/2022   Procedure: POLYPECTOMY;  Surgeon: Unk Corinn Skiff, MD;  Location: [facility] SURGERY [facility];  Service: Endoscopy;;   POLYPECTOMY  03/01/2023   Procedure: POLYPECTOMY;  Surgeon: Unk Corinn Skiff, MD;  Location: [facility] ENDOSCOPY;  Service: Gastroenterology;;   POSTERIOR LAMINECTOMY / DECOMPRESSION LUMBAR SPINE  2009   L3-S1   TONSILLECTOMY     XI ROBOTIC ASSISTED PARAESOPHAGEAL HERNIA REPAIR N/A 02/27/2020   Procedure: XI ROBOTIC ASSISTED PARAESOPHAGEAL HERNIA REPAIR;  Surgeon: Jordis Laneta FALCON, MD;  Location: [facility] ORS;  Service: General;  Laterality: N/A;    MEDICATIONS:  acetaminophen  (TYLENOL ) 650 MG CR tablet   adalimumab  (HUMIRA , 2  PEN,) 40 MG/0.4ML pen   amoxicillin  (AMOXIL ) 500 MG capsule   Ascorbic Acid  (VITAMIN C ) 1000 MG tablet   Cholecalciferol  (VITAMIN D ) 50 MCG (2000 UT) tablet   cholestyramine  (QUESTRAN ) 4 g packet   DULoxetine  (CYMBALTA ) 60 MG capsule   fluticasone (ALLERGY RELIEF) 50 MCG/ACT nasal spray   Magnesium  250 MG TABS   meclizine  (ANTIVERT ) 25 MG tablet   methimazole  (TAPAZOLE ) 5 MG tablet   Misc Natural Products (OSTEO BI-FLEX ADV JOINT SHIELD PO)   pantoprazole  (PROTONIX ) 40 MG tablet   triamterene -hydrochlorothiazide  (DYAZIDE ) 37.5-25 MG capsule   vitamin B-12 (CYANOCOBALAMIN ) 500 MCG tablet   No current facility-administered medications for this encounter.  Amoxicillin  is as needed for dental  procedures.   Isaiah Ruder, PA-C Surgical Short Stay/Anesthesiology [facility] Phone [phone] [facility] Phone [phone] 05/10/2023 3:54 PM

## 2023-05-12 NOTE — Progress Notes (Signed)
Pt made aware of surgery time change for 05/13/23 1205-1639, arrival 1000, and to follow all previous instructions given.

## 2023-05-13 ENCOUNTER — Other Ambulatory Visit: Payer: Self-pay

## 2023-05-13 ENCOUNTER — Inpatient Hospital Stay (HOSPITAL_COMMUNITY)
Admission: RE | Admit: 2023-05-13 | Discharge: 2023-05-14 | DRG: 472 | Disposition: A | Discharge status: Home / Self Care | Payer: PPO | Attending: Neurosurgery | Admitting: Neurosurgery

## 2023-05-13 ENCOUNTER — Inpatient Hospital Stay (HOSPITAL_COMMUNITY): Payer: Self-pay | Admitting: Vascular Surgery

## 2023-05-13 ENCOUNTER — Encounter (HOSPITAL_COMMUNITY)
Admission: RE | Disposition: A | Discharge status: Home / Self Care | Payer: Self-pay | Source: Home / Self Care | Attending: Neurosurgery

## 2023-05-13 ENCOUNTER — Inpatient Hospital Stay (HOSPITAL_COMMUNITY): Payer: PPO

## 2023-05-13 ENCOUNTER — Encounter (HOSPITAL_COMMUNITY): Payer: Self-pay | Admitting: Neurosurgery

## 2023-05-13 ENCOUNTER — Inpatient Hospital Stay (HOSPITAL_COMMUNITY): Payer: Self-pay | Admitting: Anesthesiology

## 2023-05-13 DIAGNOSIS — K5190 Ulcerative colitis, unspecified, without complications: Secondary | ICD-10-CM | POA: Diagnosis present

## 2023-05-13 DIAGNOSIS — Z9842 Cataract extraction status, left eye: Secondary | ICD-10-CM | POA: Diagnosis not present

## 2023-05-13 DIAGNOSIS — Z79899 Other long term (current) drug therapy: Secondary | ICD-10-CM | POA: Diagnosis not present

## 2023-05-13 DIAGNOSIS — Z8701 Personal history of pneumonia (recurrent): Secondary | ICD-10-CM | POA: Diagnosis not present

## 2023-05-13 DIAGNOSIS — Z85828 Personal history of other malignant neoplasm of skin: Secondary | ICD-10-CM | POA: Diagnosis not present

## 2023-05-13 DIAGNOSIS — M4712 Other spondylosis with myelopathy, cervical region: Secondary | ICD-10-CM

## 2023-05-13 DIAGNOSIS — Z886 Allergy status to analgesic agent status: Secondary | ICD-10-CM | POA: Diagnosis not present

## 2023-05-13 DIAGNOSIS — M4802 Spinal stenosis, cervical region: Secondary | ICD-10-CM | POA: Diagnosis not present

## 2023-05-13 DIAGNOSIS — Z9049 Acquired absence of other specified parts of digestive tract: Secondary | ICD-10-CM

## 2023-05-13 DIAGNOSIS — M4120 Other idiopathic scoliosis, site unspecified: Secondary | ICD-10-CM | POA: Diagnosis present

## 2023-05-13 DIAGNOSIS — F418 Other specified anxiety disorders: Secondary | ICD-10-CM

## 2023-05-13 DIAGNOSIS — Z961 Presence of intraocular lens: Secondary | ICD-10-CM | POA: Diagnosis not present

## 2023-05-13 DIAGNOSIS — Z803 Family history of malignant neoplasm of breast: Secondary | ICD-10-CM

## 2023-05-13 DIAGNOSIS — Z8616 Personal history of COVID-19: Secondary | ICD-10-CM | POA: Diagnosis not present

## 2023-05-13 DIAGNOSIS — M4722 Other spondylosis with radiculopathy, cervical region: Secondary | ICD-10-CM

## 2023-05-13 DIAGNOSIS — Z90710 Acquired absence of both cervix and uterus: Secondary | ICD-10-CM | POA: Diagnosis not present

## 2023-05-13 DIAGNOSIS — Z9841 Cataract extraction status, right eye: Secondary | ICD-10-CM

## 2023-05-13 DIAGNOSIS — Z981 Arthrodesis status: Secondary | ICD-10-CM | POA: Diagnosis not present

## 2023-05-13 DIAGNOSIS — Z885 Allergy status to narcotic agent status: Secondary | ICD-10-CM

## 2023-05-13 DIAGNOSIS — I10 Essential (primary) hypertension: Secondary | ICD-10-CM

## 2023-05-13 DIAGNOSIS — Z882 Allergy status to sulfonamides status: Secondary | ICD-10-CM | POA: Diagnosis not present

## 2023-05-13 DIAGNOSIS — Z96651 Presence of right artificial knee joint: Secondary | ICD-10-CM | POA: Diagnosis present

## 2023-05-13 DIAGNOSIS — M5001 Cervical disc disorder with myelopathy,  high cervical region: Secondary | ICD-10-CM | POA: Diagnosis not present

## 2023-05-13 HISTORY — PX: ANTERIOR CERVICAL DECOMPRESSION/DISCECTOMY FUSION 4 LEVELS: SHX5556

## 2023-05-13 SURGERY — ANTERIOR CERVICAL DECOMPRESSION/DISCECTOMY FUSION 4 LEVELS
Anesthesia: General | Site: Spine Cervical

## 2023-05-13 MED ORDER — THROMBIN 5000 UNITS EX KIT
PACK | CUTANEOUS | Status: AC
  Filled 2023-05-13: qty 1

## 2023-05-13 MED ORDER — ONDANSETRON HCL 4 MG/2ML IJ SOLN
INTRAMUSCULAR | Status: AC
  Filled 2023-05-13: qty 2

## 2023-05-13 MED ORDER — CEFAZOLIN SODIUM-DEXTROSE 2-4 GM/100ML-% IV SOLN
2.0000 g | Freq: Three times a day (TID) | INTRAVENOUS | Status: AC
  Administered 2023-05-13 – 2023-05-14 (×2): 2 g via INTRAVENOUS
  Filled 2023-05-13 (×2): qty 100

## 2023-05-13 MED ORDER — PROPOFOL 10 MG/ML IV BOLUS
INTRAVENOUS | Status: AC
  Filled 2023-05-13: qty 20

## 2023-05-13 MED ORDER — BISACODYL 10 MG RE SUPP: 10.0000 mg | Freq: Every day | RECTAL | Status: DC | PRN

## 2023-05-13 MED ORDER — PHENYLEPHRINE 80 MCG/ML (10ML) SYRINGE FOR IV PUSH (FOR BLOOD PRESSURE SUPPORT)
PREFILLED_SYRINGE | INTRAVENOUS | Status: DC | PRN
  Administered 2023-05-13 (×2): 160 ug via INTRAVENOUS
  Administered 2023-05-13: 80 ug via INTRAVENOUS
  Administered 2023-05-13: 160 ug via INTRAVENOUS
  Administered 2023-05-13: 80 ug via INTRAVENOUS
  Administered 2023-05-13: 160 ug via INTRAVENOUS
  Administered 2023-05-13: 80 ug via INTRAVENOUS
  Administered 2023-05-13: 160 ug via INTRAVENOUS

## 2023-05-13 MED ORDER — BACITRACIN ZINC 500 UNIT/GM EX OINT
TOPICAL_OINTMENT | CUTANEOUS | Status: DC | PRN
  Administered 2023-05-13: 1 via TOPICAL

## 2023-05-13 MED ORDER — PHENYLEPHRINE 80 MCG/ML (10ML) SYRINGE FOR IV PUSH (FOR BLOOD PRESSURE SUPPORT)
PREFILLED_SYRINGE | INTRAVENOUS | Status: AC
  Filled 2023-05-13: qty 10

## 2023-05-13 MED ORDER — BACITRACIN ZINC 500 UNIT/GM EX OINT
TOPICAL_OINTMENT | CUTANEOUS | Status: AC
  Filled 2023-05-13: qty 28.35

## 2023-05-13 MED ORDER — ALBUMIN HUMAN 5 % IV SOLN
INTRAVENOUS | Status: DC | PRN
Start: 2023-05-13 — End: 2023-05-13

## 2023-05-13 MED ORDER — MENTHOL 3 MG MT LOZG
1.0000 | LOZENGE | OROMUCOSAL | Status: DC | PRN
  Filled 2023-05-13: qty 9

## 2023-05-13 MED ORDER — ACETAMINOPHEN 325 MG PO TABS
650.0000 mg | ORAL_TABLET | ORAL | Status: DC | PRN
Start: 2023-05-13 — End: 2023-05-14

## 2023-05-13 MED ORDER — SUGAMMADEX SODIUM 200 MG/2ML IV SOLN
INTRAVENOUS | Status: DC | PRN
  Administered 2023-05-13: 150 mg via INTRAVENOUS

## 2023-05-13 MED ORDER — FENTANYL CITRATE (PF) 100 MCG/2ML IJ SOLN: 25.0000 ug | INTRAMUSCULAR | Status: DC | PRN

## 2023-05-13 MED ORDER — EPHEDRINE 5 MG/ML INJ
INTRAVENOUS | Status: AC
  Filled 2023-05-13: qty 5

## 2023-05-13 MED ORDER — ONDANSETRON HCL 4 MG PO TABS: 4.0000 mg | ORAL_TABLET | Freq: Four times a day (QID) | ORAL | Status: DC | PRN

## 2023-05-13 MED ORDER — CHLORHEXIDINE GLUCONATE CLOTH 2 % EX PADS: 6.0000 | MEDICATED_PAD | Freq: Once | CUTANEOUS | Status: DC

## 2023-05-13 MED ORDER — ALUM & MAG HYDROXIDE-SIMETH 200-200-20 MG/5ML PO SUSP: 30.0000 mL | Freq: Four times a day (QID) | ORAL | Status: DC | PRN

## 2023-05-13 MED ORDER — ROCURONIUM BROMIDE 10 MG/ML (PF) SYRINGE: PREFILLED_SYRINGE | INTRAVENOUS | Status: DC | PRN

## 2023-05-13 MED ORDER — EPHEDRINE SULFATE-NACL 50-0.9 MG/10ML-% IV SOSY
PREFILLED_SYRINGE | INTRAVENOUS | Status: DC | PRN
  Administered 2023-05-13: 5 mg via INTRAVENOUS

## 2023-05-13 MED ORDER — LACTATED RINGERS IV SOLN: INTRAVENOUS | Status: DC | PRN

## 2023-05-13 MED ORDER — ZOLPIDEM TARTRATE 5 MG PO TABS: 5.0000 mg | ORAL_TABLET | Freq: Every evening | ORAL | Status: DC | PRN

## 2023-05-13 MED ORDER — FENTANYL CITRATE (PF) 250 MCG/5ML IJ SOLN: INTRAMUSCULAR | Status: DC | PRN

## 2023-05-13 MED ORDER — OXYCODONE HCL 5 MG PO TABS: 5.0000 mg | ORAL_TABLET | ORAL | Status: DC | PRN

## 2023-05-13 MED ORDER — METHIMAZOLE 2.5 MG HALF TABLET
2.5000 mg | ORAL_TABLET | Freq: Every day | ORAL | Status: DC
  Administered 2023-05-14: 2.5 mg via ORAL
  Filled 2023-05-13: qty 1

## 2023-05-13 MED ORDER — DOCUSATE SODIUM 100 MG PO CAPS
100.0000 mg | ORAL_CAPSULE | Freq: Two times a day (BID) | ORAL | Status: DC
  Administered 2023-05-13 – 2023-05-14 (×2): 100 mg via ORAL
  Filled 2023-05-13 (×2): qty 1

## 2023-05-13 MED ORDER — ACETAMINOPHEN 500 MG PO TABS
1000.0000 mg | ORAL_TABLET | Freq: Four times a day (QID) | ORAL | Status: DC
  Administered 2023-05-13 – 2023-05-14 (×3): 1000 mg via ORAL
  Filled 2023-05-13 (×3): qty 2

## 2023-05-13 MED ORDER — PHENYLEPHRINE HCL-NACL 20-0.9 MG/250ML-% IV SOLN
INTRAVENOUS | Status: DC | PRN
  Administered 2023-05-13: 20 ug/min via INTRAVENOUS

## 2023-05-13 MED ORDER — LACTATED RINGERS IV SOLN: INTRAVENOUS | Status: DC

## 2023-05-13 MED ORDER — CEFAZOLIN SODIUM-DEXTROSE 2-4 GM/100ML-% IV SOLN
2.0000 g | INTRAVENOUS | Status: AC
  Administered 2023-05-13: 2 g via INTRAVENOUS
  Filled 2023-05-13: qty 100

## 2023-05-13 MED ORDER — CHLORHEXIDINE GLUCONATE 0.12 % MT SOLN: 15.0000 mL | Freq: Once | OROMUCOSAL | Status: AC

## 2023-05-13 MED ORDER — MORPHINE SULFATE (PF) 2 MG/ML IV SOLN: 2.0000 mg | INTRAVENOUS | Status: DC | PRN

## 2023-05-13 MED ORDER — FENTANYL CITRATE (PF) 250 MCG/5ML IJ SOLN
INTRAMUSCULAR | Status: AC
  Filled 2023-05-13: qty 5

## 2023-05-13 MED ORDER — FENTANYL CITRATE (PF) 250 MCG/5ML IJ SOLN
INTRAMUSCULAR | Status: DC | PRN
  Administered 2023-05-13: 150 ug via INTRAVENOUS
  Administered 2023-05-13: 25 ug via INTRAVENOUS
  Administered 2023-05-13: 50 ug via INTRAVENOUS
  Administered 2023-05-13: 25 ug via INTRAVENOUS

## 2023-05-13 MED ORDER — ACETAMINOPHEN 650 MG RE SUPP: 650.0000 mg | RECTAL | Status: DC | PRN

## 2023-05-13 MED ORDER — PANTOPRAZOLE SODIUM 40 MG PO TBEC
40.0000 mg | DELAYED_RELEASE_TABLET | Freq: Two times a day (BID) | ORAL | Status: DC
  Administered 2023-05-13 – 2023-05-14 (×2): 40 mg via ORAL
  Filled 2023-05-13 (×2): qty 1

## 2023-05-13 MED ORDER — PANTOPRAZOLE SODIUM 40 MG IV SOLR: 40.0000 mg | Freq: Every day | INTRAVENOUS | Status: DC

## 2023-05-13 MED ORDER — LIDOCAINE 2% (20 MG/ML) 5 ML SYRINGE
INTRAMUSCULAR | Status: AC
  Filled 2023-05-13: qty 5

## 2023-05-13 MED ORDER — DULOXETINE HCL 60 MG PO CPEP
60.0000 mg | ORAL_CAPSULE | Freq: Every day | ORAL | Status: DC
  Administered 2023-05-13: 60 mg via ORAL
  Filled 2023-05-13: qty 1

## 2023-05-13 MED ORDER — CYCLOBENZAPRINE HCL 10 MG PO TABS
10.0000 mg | ORAL_TABLET | Freq: Three times a day (TID) | ORAL | Status: DC | PRN
  Administered 2023-05-13: 10 mg via ORAL
  Filled 2023-05-13: qty 1

## 2023-05-13 MED ORDER — BUPIVACAINE-EPINEPHRINE (PF) 0.5% -1:200000 IJ SOLN
INTRAMUSCULAR | Status: AC
  Filled 2023-05-13: qty 30

## 2023-05-13 MED ORDER — DEXAMETHASONE SODIUM PHOSPHATE 10 MG/ML IJ SOLN
INTRAMUSCULAR | Status: AC
  Filled 2023-05-13: qty 1

## 2023-05-13 MED ORDER — ONDANSETRON HCL 4 MG/2ML IJ SOLN
INTRAMUSCULAR | Status: DC | PRN
  Administered 2023-05-13: 4 mg via INTRAVENOUS

## 2023-05-13 MED ORDER — BUPIVACAINE-EPINEPHRINE (PF) 0.5% -1:200000 IJ SOLN
INTRAMUSCULAR | Status: DC | PRN
  Administered 2023-05-13: 10 mL

## 2023-05-13 MED ORDER — SUGAMMADEX SODIUM 200 MG/2ML IV SOLN
INTRAVENOUS | Status: AC
  Filled 2023-05-13: qty 2

## 2023-05-13 MED ORDER — DEXAMETHASONE 4 MG PO TABS
4.0000 mg | ORAL_TABLET | Freq: Four times a day (QID) | ORAL | Status: AC
  Administered 2023-05-13: 4 mg via ORAL
  Filled 2023-05-13: qty 1

## 2023-05-13 MED ORDER — PROPOFOL 10 MG/ML IV BOLUS
INTRAVENOUS | Status: DC | PRN
  Administered 2023-05-13: 200 mg via INTRAVENOUS

## 2023-05-13 MED ORDER — TRIAMTERENE-HCTZ 37.5-25 MG PO TABS
1.0000 | ORAL_TABLET | Freq: Every day | ORAL | Status: DC
  Filled 2023-05-13: qty 1

## 2023-05-13 MED ORDER — CHOLESTYRAMINE 4 G PO PACK
4.0000 g | PACK | Freq: Two times a day (BID) | ORAL | Status: DC
  Administered 2023-05-13 – 2023-05-14 (×2): 4 g via ORAL
  Filled 2023-05-13 (×2): qty 1

## 2023-05-13 MED ORDER — ROCURONIUM BROMIDE 10 MG/ML (PF) SYRINGE
PREFILLED_SYRINGE | INTRAVENOUS | Status: AC
  Filled 2023-05-13: qty 10

## 2023-05-13 MED ORDER — LIDOCAINE HCL (CARDIAC) PF 100 MG/5ML IV SOSY
PREFILLED_SYRINGE | INTRAVENOUS | Status: DC | PRN
  Administered 2023-05-13: 60 mg via INTRATRACHEAL

## 2023-05-13 MED ORDER — ACETAMINOPHEN 10 MG/ML IV SOLN
INTRAVENOUS | Status: DC | PRN
  Administered 2023-05-13: 1000 mg via INTRAVENOUS

## 2023-05-13 MED ORDER — DEXAMETHASONE SODIUM PHOSPHATE 4 MG/ML IJ SOLN
4.0000 mg | Freq: Four times a day (QID) | INTRAMUSCULAR | Status: AC
  Administered 2023-05-13: 4 mg via INTRAVENOUS
  Filled 2023-05-13: qty 1

## 2023-05-13 MED ORDER — 0.9 % SODIUM CHLORIDE (POUR BTL) OPTIME
TOPICAL | Status: DC | PRN
  Administered 2023-05-13: 1000 mL

## 2023-05-13 MED ORDER — CHLORHEXIDINE GLUCONATE 0.12 % MT SOLN
OROMUCOSAL | Status: AC
  Administered 2023-05-13: 15 mL via OROMUCOSAL
  Filled 2023-05-13: qty 15

## 2023-05-13 MED ORDER — ONDANSETRON HCL 4 MG/2ML IJ SOLN: 4.0000 mg | Freq: Four times a day (QID) | INTRAMUSCULAR | Status: DC | PRN

## 2023-05-13 MED ORDER — ORAL CARE MOUTH RINSE: 15.0000 mL | Freq: Once | OROMUCOSAL | Status: AC

## 2023-05-13 MED ORDER — OXYCODONE HCL 5 MG PO TABS
10.0000 mg | ORAL_TABLET | ORAL | Status: DC | PRN
  Administered 2023-05-13 – 2023-05-14 (×3): 10 mg via ORAL
  Filled 2023-05-13 (×3): qty 2

## 2023-05-13 MED ORDER — ROCURONIUM BROMIDE 100 MG/10ML IV SOLN
INTRAVENOUS | Status: DC | PRN
  Administered 2023-05-13 (×2): 10 mg via INTRAVENOUS
  Administered 2023-05-13: 60 mg via INTRAVENOUS

## 2023-05-13 MED ORDER — PHENOL 1.4 % MT LIQD: 1.0000 | OROMUCOSAL | Status: DC | PRN

## 2023-05-13 MED ORDER — THROMBIN 5000 UNITS EX SOLR
OROMUCOSAL | Status: DC | PRN
  Administered 2023-05-13 (×2): 5 mL via TOPICAL

## 2023-05-13 MED ORDER — DEXAMETHASONE SODIUM PHOSPHATE 10 MG/ML IJ SOLN
INTRAMUSCULAR | Status: DC | PRN
  Administered 2023-05-13: 10 mg via INTRAVENOUS

## 2023-05-13 SURGICAL SUPPLY — 54 items
BAG COUNTER SPONGE SURGICOUNT (BAG) ×1 IMPLANT
BAND RUBBER #18 3X1/16 STRL (MISCELLANEOUS) ×2 IMPLANT
BENZOIN TINCTURE PRP APPL 2/3 (GAUZE/BANDAGES/DRESSINGS) ×2 IMPLANT
BIT DRILL NEURO 2X3.1 SFT TUCH (MISCELLANEOUS) ×1 IMPLANT
BLADE SURG 15 STRL LF DISP TIS (BLADE) ×1 IMPLANT
BLADE ULTRA TIP 2M (BLADE) ×1 IMPLANT
BUR BARREL STRAIGHT FLUTE 4.0 (BURR) ×1 IMPLANT
BUR MATCHSTICK NEURO 3.0 LAGG (BURR) ×1 IMPLANT
CAGE ANTC PEEK 11X11X7 0D (Spacer) IMPLANT
CAGE PEEK VISTAS 11X14X6 (Cage) IMPLANT
CANISTER SUCT 3000ML PPV (MISCELLANEOUS) ×1 IMPLANT
CLSR STERI-STRIP ANTIMIC 1/2X4 (GAUZE/BANDAGES/DRESSINGS) IMPLANT
COVER MAYO STAND STRL (DRAPES) ×1 IMPLANT
DRAIN 10X20 3/4 PERF LF SIL ST (DRAIN) IMPLANT
DRAPE LAPAROTOMY 100X72 PEDS (DRAPES) ×1 IMPLANT
DRAPE MICROSCOPE SLANT 54X150 (MISCELLANEOUS) IMPLANT
DRAPE SURG 17X23 STRL (DRAPES) ×2 IMPLANT
DRILL NEURO 2X3.1 SOFT TOUCH (MISCELLANEOUS) ×1
DRSG OPSITE POSTOP 3X4 (GAUZE/BANDAGES/DRESSINGS) ×1 IMPLANT
DRSG OPSITE POSTOP 4X6 (GAUZE/BANDAGES/DRESSINGS) IMPLANT
ELECT REM PT RETURN 9FT ADLT (ELECTROSURGICAL) ×1
ELECTRODE REM PT RTRN 9FT ADLT (ELECTROSURGICAL) ×1 IMPLANT
EVACUATOR SILICONE 100CC (DRAIN) IMPLANT
GAUZE 4X4 16PLY ~~LOC~~+RFID DBL (SPONGE) IMPLANT
GLOVE BIO SURGEON STRL SZ 6.5 (GLOVE) ×1 IMPLANT
GLOVE BIO SURGEON STRL SZ8 (GLOVE) ×1 IMPLANT
GLOVE BIO SURGEON STRL SZ8.5 (GLOVE) ×1 IMPLANT
GLOVE BIOGEL PI IND STRL 6.5 (GLOVE) ×1 IMPLANT
GLOVE EXAM NITRILE XL STR (GLOVE) IMPLANT
GOWN STRL REUS W/ TWL LRG LVL3 (GOWN DISPOSABLE) ×1 IMPLANT
GOWN STRL REUS W/ TWL XL LVL3 (GOWN DISPOSABLE) IMPLANT
HEMOSTAT POWDER KIT SURGIFOAM (HEMOSTASIS) ×1 IMPLANT
KIT BASIN OR (CUSTOM PROCEDURE TRAY) ×1 IMPLANT
KIT TURNOVER KIT B (KITS) ×1 IMPLANT
MARKER SKIN DUAL TIP RULER LAB (MISCELLANEOUS) ×1 IMPLANT
NDL HYPO 22X1.5 SAFETY MO (MISCELLANEOUS) ×1 IMPLANT
NDL SPNL 18GX3.5 QUINCKE PK (NEEDLE) ×1 IMPLANT
NEEDLE HYPO 22X1.5 SAFETY MO (MISCELLANEOUS) ×1
NEEDLE SPNL 18GX3.5 QUINCKE PK (NEEDLE) ×1
NS IRRIG 1000ML POUR BTL (IV SOLUTION) ×1 IMPLANT
PACK LAMINECTOMY NEURO (CUSTOM PROCEDURE TRAY) ×1 IMPLANT
PIN DISTRACTION 14MM (PIN) ×2 IMPLANT
PLATE ANT CERV XTEND 4 LV 60 (Plate) IMPLANT
PUTTY DBM 5CC CALC GRAN (Putty) IMPLANT
SCREW XTD VAR 4.2 SELF TAP 12 (Screw) IMPLANT
SPIKE FLUID TRANSFER (MISCELLANEOUS) ×1 IMPLANT
SPONGE INTESTINAL PEANUT (DISPOSABLE) ×2 IMPLANT
SPONGE SURGIFOAM ABS GEL 100 (HEMOSTASIS) IMPLANT
STRIP CLOSURE SKIN 1/2X4 (GAUZE/BANDAGES/DRESSINGS) ×1 IMPLANT
SUT VIC AB 0 CT1 27XBRD ANTBC (SUTURE) ×1 IMPLANT
SUT VIC AB 3-0 SH 8-18 (SUTURE) ×1 IMPLANT
TOWEL GREEN STERILE (TOWEL DISPOSABLE) ×1 IMPLANT
TOWEL GREEN STERILE FF (TOWEL DISPOSABLE) ×1 IMPLANT
WATER STERILE IRR 1000ML POUR (IV SOLUTION) ×1 IMPLANT

## 2023-05-13 NOTE — Op Note (Signed)
Brief history: The patient is a 78 year old white female who is complaining of neck pain with bilateral arm pain numbness tingling weakness.  She has failed medical management.  She was worked up with a cervical MRI which demonstrated multilevel disc degeneration, spondylosis, stenosis, etc.  I discussed the various treatment options with her.  She has decided proceed with surgery.  Preoperative diagnosis: Cervicalgia, cervical spondylosis, cervical stenosis, cervical myelopathy, cervical radiculopathy  Postoperative diagnosis: The same  Procedure: C3-4, C4-5, C5-6 and C6-7 anterior cervical discectomy/decompression; C3-4, C4-5, C5-6 and C6-7 interbody arthrodesis with local morcellized autograft bone and Zimmer DBM; insertion of interbody prosthesis at C3-4, C4-5, C5-6 and C6-7 (Zimmer peek interbody prosthesis); anterior cervical plating from C3-C7 with globus titanium plate  Surgeon: Dr. Delma Officer  Asst.: Dr. Lisbeth Renshaw  Anesthesia: Gen. endotracheal  Estimated blood loss: 150 cc  Drains: Jackson-Pratt drain in the prevertebral space  Complications: None  Description of procedure: The patient was brought to the operating room by the anesthesia team. General endotracheal anesthesia was induced. A roll was placed under the patient's shoulders to keep the neck in the neutral position. The patient's anterior cervical region was then prepared with Betadine scrub and Betadine solution. Sterile drapes were applied.  The area to be incised was then injected with Marcaine with epinephrine solution. I then used a scalpel to make a transverse incision in the patient's left anterior neck. I used the Metzenbaum scissors to divide the platysmal muscle and then to dissect medial to the sternocleidomastoid muscle, jugular vein, and carotid artery. I carefully dissected down towards the anterior cervical spine identifying the esophagus and retracting it medially. Then using Kitner swabs to clear  soft tissue from the anterior cervical spine. We then inserted a bent spinal needle into the upper exposed intervertebral disc space. We then obtained intraoperative radiographs confirm our location.  I then used electrocautery to detach the medial border of the longus colli muscle bilaterally from the C3-4, C4-5, C5-6 and C6-7 intervertebral disc spaces. I then inserted the Caspar self-retaining retractor underneath the longus colli muscle bilaterally to provide exposure.  We then incised the intervertebral disc at C3-4. We then performed a partial intervertebral discectomy with a pituitary forceps and the Karlin curettes. I then inserted distraction screws into the vertebral bodies at C3-4. We then distracted the interspace. We then used the high-speed drill to decorticate the vertebral endplates at C3-4, to drill away the remainder of the intervertebral disc, to drill away some posterior spondylosis, and to thin out the posterior longitudinal ligament. I then incised ligament with the arachnoid knife. We then removed the ligament with a Kerrison punches undercutting the vertebral endplates and decompressing the thecal sac. We then performed foraminotomies about the bilateral C4 nerve roots. This completed the decompression at this level.  We then repeated the procedure in analogous fashion at C4-5, C5-6 and C6-7 decompressing the thecal sac and the bilateral C5, C6 and C7 nerve roots.  We now turned our to attention to the interbody fusion. We used the trial spacers to determine the appropriate size for the interbody prosthesis. We then pre-filled prosthesis with a combination of local morcellized autograft bone that we obtained during decompression as well as Zimmer DBM. We then inserted the prosthesis into the distracted interspace at C3-4, C4-5, C5-6 and C6-7. We then removed the distraction screws. There was a good snug fit of the prosthesis in the interspace.  Having completed the fusion we now  turned attention to the anterior spinal  instrumentation. We used the high-speed drill to drill away some anterior spondylosis at the disc spaces so that the plate lay down flat. We selected the appropriate length titanium anterior cervical plate. We laid it along the anterior aspect of the vertebral bodies from C3-C7. We then drilled 12 mm holes at C3, C4, C5, C6 and C7. We then secured the plate to the vertebral bodies by placing two 12 mm self-tapping screws at C3, C4, C5, C6 and C7. We then obtained intraoperative radiograph. The demonstrating good position of the upper instrumentation.  We could not see the lower instrumentation on x-ray but it look good in vivo.  We therefore secured the screws the plate the locking each cam. This completed the instrumentation.  We then obtained hemostasis using bipolar electrocautery. We irrigated the wound out with saline solution. We then removed the retractor.  We placed a 10 mm flat Jackson-Pratt drain in the prevertebral space and tunneled it out through a separate stab wound.  We inspected the esophagus for any damage. There was none apparent. We then reapproximated patient's platysmal muscle with interrupted 3-0 Vicryl suture. We then reapproximated the subcutaneous tissue with interrupted 3-0 Vicryl suture. The skin was reapproximated with Steri-Strips and benzoin. The wound was then covered with bacitracin ointment. A sterile dressing was applied. The drapes were removed. Patient was subsequently extubated by the anesthesia team and transported to the post anesthesia care unit in stable condition. All sponge instrument and needle counts were reportedly correct at the end of this case.

## 2023-05-13 NOTE — Progress Notes (Signed)
Orthopedic Tech Progress Note Patient Details:  April Larsen 1946-02-08 [id number]  Aspen cervical collar delivered on behalf of [employer].  Ortho Devices Type of Ortho Device: Aspen cervical collar Ortho Device/Splint Location: delivered to [facility] for RN to apply to pt Ortho Device/Splint Interventions: Ordered      Docia Furl 05/13/2023, 6:19 PM

## 2023-05-13 NOTE — Anesthesia Procedure Notes (Signed)
Procedure Name: Intubation Date/Time: 05/13/2023 1:20 PM  Performed by: Thomasene Ripple, CRNAPre-anesthesia Checklist: Patient identified, Emergency Drugs available, Suction available and Patient being monitored Patient Re-evaluated:Patient Re-evaluated prior to induction Oxygen Delivery Method: Circle System Utilized Preoxygenation: Pre-oxygenation with 100% oxygen Induction Type: IV induction Ventilation: Mask ventilation without difficulty Laryngoscope Size: Glidescope and 3 Grade View: Grade I Tube type: Oral Tube size: 7.0 mm Number of attempts: 1 Airway Equipment and Method: Stylet and Oral airway Placement Confirmation: ETT inserted through vocal cords under direct vision, positive ETCO2 and breath sounds checked- equal and bilateral Secured at: 21 cm Tube secured with: Tape Dental Injury: Teeth and Oropharynx as per pre-operative assessment

## 2023-05-13 NOTE — Transfer of Care (Signed)
Immediate Anesthesia Transfer of Care Note  Patient: JUWANA THORESON  Procedure(s) Performed: ANTERIOR CERVICAL DECOMPRESSION/DISCECTOMY Aretha Parrot PROSTHESIS,PLATE SCREWS CERVICAL THREE-FOUR,CERVICAL FOUR-FIVE,CERVICAL FIVE-SIX,CERVICAL SIX-SEVEN (Spine Cervical)  Patient Location: PACU  Anesthesia Type:General  Level of Consciousness: awake, alert , and oriented  Airway & Oxygen Therapy: Patient Spontanous Breathing and Patient connected to face mask oxygen  Post-op Assessment: Report given to RN and Post -op Vital signs reviewed and stable  Post vital signs: Reviewed and stable  Last Vitals:  Vitals Value Taken Time  BP 93/53 05/13/23 1737  Temp    Pulse 92 05/13/23 1740  Resp 19 05/13/23 1740  SpO2 94 % 05/13/23 1740  Vitals shown include unfiled device data.  Last Pain:  Vitals:   05/13/23 1034  TempSrc:   PainSc: 3       Patients Stated Pain Goal: 0 (05/13/23 1034)  Complications: No notable events documented.

## 2023-05-13 NOTE — H&P (Signed)
Subjective: The patient is a 78 year old white female who was complained of neck pain with pain, weakness, numbness and tingling into her bilateral arms.  She failed medical management and was worked up with a cervical MRI and cervical x-rays which demonstrated cervical spondylosis with stenosis most prominent at C3-4, C4-5, C5-6 and C6-7.  I discussed the various treatment options with her.  She has tried to proceed with surgery.  Past Medical History:  Diagnosis Date   Actinic keratosis    Anemia    Anxiety    Arthritis    Cancer (HCC) 2016   skin   Cataract (lens) fragments in eye following cataract surgery, bilateral 2013   one eye done then 4 weeks later the other eye done.   Chronic ulcerative colitis, without complications (HCC) 10/29/2015   Diverticulitis 2018   Esophagitis, reflux 10/04/2014   Family history of adverse reaction to anesthesia    SISTER HARD TO WAKE UP and nausea and vomiting.   Female stress incontinence 08/09/2013   Gastroesophageal reflux disease without esophagitis 08/06/2016   GERD (gastroesophageal reflux disease)    attributed to Hiatal Hernia   Headache    MIGRAINE   History of hiatal hernia    Hypertension    Hyperthyroidism    Incomplete emptying of bladder 10/29/2013   Migraine 07/26/2013   Pneumonia due to COVID-19 virus 2020   PONV (postoperative nausea and vomiting)    nausea, does not remember vomiting.   Postherpetic neuralgia    RIGHT 7TH.CRANIAL NERVE from shingles followed by Bell's palsy   Postmenopausal 11/09/2016   PVC (premature ventricular contraction)    Scoliosis    Scoliosis (and kyphoscoliosis), idiopathic 07/26/2013   Scoliosis of lumbosacral spine 01/18/2015   Squamous cell carcinoma of skin 03/13/2013   L clavicle    Squamous cell carcinoma of skin 03/10/2018   L med knee   Squamous cell carcinoma of skin 02/21/2019   R cheek    Squamous cell carcinoma of skin 06/01/2019   L mid lat pretibial - ED&C   Trochanteric  bursitis 11/29/2014   Ulcerative colitis (HCC)    Urethral prolapse 08/09/2013   Urge incontinence 08/09/2013    Past Surgical History:  Procedure Laterality Date   ABDOMINAL HYSTERECTOMY     BACK SURGERY  03/12/2021   L3-L4 AND L3-S1 DECOMPRESSION   BIOPSY  03/01/2023   Procedure: BIOPSY;  Surgeon: Toney Reil, MD;  Location: [facility] ENDOSCOPY;  Service: Gastroenterology;;   CARPAL TUNNEL RELEASE Bilateral    CATARACT EXTRACTION W/ INTRAOCULAR LENS  IMPLANT, BILATERAL     CHOLECYSTECTOMY N/A 08/21/2016   Procedure: LAPAROSCOPIC CHOLECYSTECTOMY WITH INTRAOPERATIVE CHOLANGIOGRAM;  Surgeon: Nadeen Landau, MD;  Location: [facility] ORS;  Service: General;  Laterality: N/A;   COLONOSCOPY  11/06/2005   COLONOSCOPY  2010   COLONOSCOPY  2013   COLONOSCOPY WITH PROPOFOL N/A 07/29/2016   Procedure: COLONOSCOPY WITH PROPOFOL;  Surgeon: Scot Jun, MD;  Location: [facility] ENDOSCOPY;  Service: Endoscopy;  Laterality: N/A;   COLONOSCOPY WITH PROPOFOL N/A 08/22/2020   Procedure: COLONOSCOPY WITH PROPOFOL;  Surgeon: Pasty Spillers, MD;  Location: [facility] ENDOSCOPY;  Service: Endoscopy;  Laterality: N/A;   COLONOSCOPY WITH PROPOFOL N/A 01/22/2022   Procedure: COLONOSCOPY WITH PROPOFOL;  Surgeon: Toney Reil, MD;  Location: [facility] SURGERY CNTR;  Service: Endoscopy;  Laterality: N/A;   COLONOSCOPY WITH PROPOFOL N/A 07/23/2022   Procedure: COLONOSCOPY WITH PROPOFOL;  Surgeon: Toney Reil, MD;  Location: [facility] SURGERY CNTR;  Service: Endoscopy;  Laterality: N/A;   COLONOSCOPY WITH PROPOFOL N/A 03/01/2023   Procedure: COLONOSCOPY WITH PROPOFOL;  Surgeon: Toney Reil, MD;  Location: [facility] ENDOSCOPY;  Service: Gastroenterology;  Laterality: N/A;   ESOPHAGOGASTRODUODENOSCOPY  11/06/2005   ESOPHAGOGASTRODUODENOSCOPY N/A 06/24/2022   Procedure: ESOPHAGOGASTRODUODENOSCOPY (EGD);  Surgeon: Toney Reil, MD;  Location: [facility] ENDOSCOPY;  Service: Gastroenterology;  Laterality:  N/A;   ESOPHAGOGASTRODUODENOSCOPY (EGD) WITH PROPOFOL N/A 07/29/2016   Procedure: ESOPHAGOGASTRODUODENOSCOPY (EGD) WITH PROPOFOL;  Surgeon: Scot Jun, MD;  Location: [facility] ENDOSCOPY;  Service: Endoscopy;  Laterality: N/A;   ESOPHAGOGASTRODUODENOSCOPY (EGD) WITH PROPOFOL N/A 02/07/2020   Procedure: ESOPHAGOGASTRODUODENOSCOPY (EGD) WITH PROPOFOL;  Surgeon: Pasty Spillers, MD;  Location: [facility] ENDOSCOPY;  Service: Endoscopy;  Laterality: N/A;   ESOPHAGOGASTRODUODENOSCOPY (EGD) WITH PROPOFOL N/A 01/22/2022   Procedure: ESOPHAGOGASTRODUODENOSCOPY (EGD) WITH PROPOFOL;  Surgeon: Toney Reil, MD;  Location: [facility]GERY [facility];  Service: Endoscopy;  Laterality: N/A;   EYE SURGERY     GIVENS CAPSULE STUDY N/A 05/07/2022   Procedure: GIVENS CAPSULE STUDY;  Surgeon: Toney Reil, MD;  Location: [facility] ENDOSCOPY;  Service: Gastroenterology;  Laterality: N/A;   GIVENS CAPSULE STUDY N/A 06/24/2022   Procedure: GIVENS CAPSULE STUDY;  Surgeon: Toney Reil, MD;  Location: [facility] ENDOSCOPY;  Service: Gastroenterology;  Laterality: N/A;   KNEE ARTHROPLASTY Right 05/13/2022   Procedure: COMPUTER ASSISTED TOTAL KNEE ARTHROPLASTY;  Surgeon: Donato Heinz, MD;  Location: [facility] ORS;  Service: Orthopedics;  Laterality: Right;   LAPAROSCOPIC LOW ANTERIOR RESECTION  11/24/2016   Procedure: LAPAROSCOPIC LOW ANTERIOR RESECTION;  Surgeon: Leafy Ro, MD;  Location: [facility] ORS;  Service: General;;   LUMBAR LAMINECTOMY  2004   L3-4   PARTIAL COLECTOMY  2018   POLYPECTOMY  01/22/2022   Procedure: POLYPECTOMY;  Surgeon: Toney Reil, MD;  Location: [facility] SURGERY [facility];  Service: Endoscopy;;   POLYPECTOMY  03/01/2023   Procedure: POLYPECTOMY;  Surgeon: Toney Reil, MD;  Location: [facility] ENDOSCOPY;  Service: Gastroenterology;;   POSTERIOR LAMINECTOMY / DECOMPRESSION LUMBAR SPINE  2009   L3-S1   TONSILLECTOMY     XI ROBOTIC ASSISTED PARAESOPHAGEAL HERNIA REPAIR N/A 02/27/2020    Procedure: XI ROBOTIC ASSISTED PARAESOPHAGEAL HERNIA REPAIR;  Surgeon: Leafy Ro, MD;  Location: [facility] ORS;  Service: General;  Laterality: N/A;    Allergies  Allergen Reactions   Dilaudid [Hydromorphone] Other (See Comments)    Hallucinations   Nsaids Other (See Comments)    Patient has had part of her colon removed    Sulfa Antibiotics Rash    Social History   Tobacco Use   Smoking status: Never   Smokeless tobacco: Never  Substance Use Topics   Alcohol use: No    Family History  Problem Relation Age of Onset   Breast cancer Sister 38   Breast cancer Paternal Aunt 53   Breast cancer Cousin        1st paternal cousin   Bladder Cancer Neg Hx    Prostate cancer Neg Hx    Kidney cancer Neg Hx    Prior to Admission medications   Medication Sig Start Date End Date Taking? Authorizing Provider  acetaminophen (TYLENOL) 650 MG CR tablet Take 1,300 mg by mouth 2 (two) times daily.   Yes [provider]  Ascorbic Acid (VITAMIN C) 1000 MG tablet Take 1,000 mg by mouth daily.   Yes [provider]  Cholecalciferol (VITAMIN D) 50 MCG (2000 UT) tablet Take 2,000 Units by mouth daily.   Yes [provider]  cholestyramine (QUESTRAN) 4 g packet Take 1 packet (4 g total) by mouth 2 (two) times daily. 03/18/23  Yes Vanga, Loel Dubonnet, MD  DULoxetine (CYMBALTA) 60 MG capsule Take 60 mg by mouth at bedtime.  06/17/15  Yes [provider]  Magnesium 250 MG TABS Take 250 mg by mouth daily.   Yes [provider]  methimazole (TAPAZOLE) 5 MG tablet Take 2.5 mg by mouth daily.   Yes [provider]  Misc Natural Products (OSTEO BI-FLEX ADV JOINT SHIELD PO) Take 1 tablet by mouth daily.   Yes [provider]  pantoprazole (PROTONIX) 40 MG tablet Take 40 mg by mouth 2 (two) times daily. 08/21/22  Yes [provider]  triamterene-hydrochlorothiazide (DYAZIDE) 37.5-25 MG capsule Take 1 capsule by mouth daily.   Yes [provider]  vitamin B-12 (CYANOCOBALAMIN) 500 MCG tablet Take 500 mcg by mouth daily.   Yes [provider]  adalimumab (HUMIRA, 2 PEN,) 40 MG/0.4ML pen Inject 0.4 mLs (40 mg total) into the skin every 7 (seven) days. 10/05/22   Toney Reil, MD  amoxicillin (AMOXIL) 500 MG capsule Take 2,000 mg by mouth See admin instructions. Take prior dental procedures    [provider]  fluticasone (ALLERGY RELIEF) 50 MCG/ACT nasal spray Place 1 spray into both nostrils daily as needed for allergies or rhinitis.    [provider]  meclizine (ANTIVERT) 25 MG tablet Take 1 tablet (25 mg total) by mouth 3 (three) times daily as needed for dizziness or nausea. 06/23/21   Tresa Moore, MD     Review of Systems  Positive ROS: As above  All other systems have been reviewed and were otherwise negative with the exception of those mentioned in the HPI and as above.  Objective: Vital signs in last 24 hours: Temp:  [98.1 F (36.7 C)] 98.1 F (36.7 C) (02/13 1010) Pulse Rate:  [86] 86 (02/13 1010) Resp:  [18] 18 (02/13 1010) BP: (131)/(83) 131/83 (02/13 1010) SpO2:  [99 %] 99 % (02/13 1010) Weight:  [75.8 kg] 75.8 kg (02/13 1010) Estimated body mass index is 26.95 kg/m as calculated from the following:   Height as of this encounter: 5\' 6"  (1.676 m).   Weight as of this encounter: 75.8 kg.   General Appearance: Alert Head: Normocephalic, without obvious abnormality, atraumatic Eyes: PERRL, conjunctiva/corneas clear, EOM's intact,    Ears: Normal  Throat: Normal  Neck: Mid cervical range of motion.  Positive Spurling's testing Back: unremarkable Lungs: Clear to auscultation bilaterally, respirations unlabored Heart: Regular rate and rhythm, no murmur, rub or gallop Abdomen: Soft, non-tender Extremities: Extremities normal, atraumatic, no cyanosis or edema Skin: unremarkable  NEUROLOGIC:   Mental status: alert and oriented,Motor Exam - grossly  normal Sensory Exam - grossly normal Reflexes:  Coordination - grossly normal Gait - grossly normal Balance - grossly normal Cranial Nerves: I: smell Not tested  II: visual acuity  OS: Normal  OD: Normal   II: visual fields Full to confrontation  II: pupils Equal, round, reactive to light  III,VII: ptosis None  III,IV,VI: extraocular muscles  Full ROM  V: mastication Normal  V: facial light touch sensation  Normal  V,VII: corneal reflex  Present  VII: facial muscle function - upper  Normal  VII: facial muscle function - lower Normal  VIII: hearing Not tested  IX: soft palate elevation  Normal  IX,X: gag reflex Present  XI: trapezius strength  5/5  XI: sternocleidomastoid strength  5/5  XI: neck flexion strength  5/5  XII: tongue strength  Normal    Data Review Lab Results  Component Value Date   WBC 9.9 05/07/2023   HGB 14.2 05/07/2023   HCT 42.1 05/07/2023   MCV 93.6 05/07/2023   PLT 333 05/07/2023   Lab Results  Component Value Date   NA 137 05/07/2023   K 4.1 05/07/2023   CL 104 05/07/2023   CO2 23 05/07/2023   BUN 26 (H) 05/07/2023   CREATININE 0.92 05/07/2023   GLUCOSE 99 05/07/2023   No results found for: "INR", "PROTIME"  Assessment/Plan: Cervical spondylosis, cervical foraminal stenosis, cervical spinal stenosis, cervical radiculopathy, cervical myelopathy: I have discussed situation with the patient.  I reviewed her imaging studies with her and pointed out the abnormalities.  We have discussed the various treatment options including surgery.  I have described the surgical treatment option of a C3-4, C4-5, C5-6 and C6-7 anterior cervicectomy, fusion and plating.  I have shown her surgical models.  I have given her a surgical pamphlet.  We have discussed the risk, benefits, alternatives, expected postoperative course, and likelihood of achieving our goals with surgery.  I have answered all her questions.  She has decided proceed with surgery.   Cristi Loron 05/13/2023 12:04 PM

## 2023-05-14 ENCOUNTER — Other Ambulatory Visit: Payer: Self-pay

## 2023-05-14 ENCOUNTER — Encounter (HOSPITAL_COMMUNITY): Payer: Self-pay | Admitting: Neurosurgery

## 2023-05-14 MED ORDER — CYCLOBENZAPRINE HCL 5 MG PO TABS: 5.0000 mg | ORAL_TABLET | Freq: Three times a day (TID) | ORAL | 0 refills | Status: DC | PRN

## 2023-05-14 MED ORDER — OXYCODONE-ACETAMINOPHEN 5-325 MG PO TABS
1.0000 | ORAL_TABLET | ORAL | Status: DC | PRN
  Administered 2023-05-14: 2 via ORAL
  Filled 2023-05-14: qty 2

## 2023-05-14 MED ORDER — OXYCODONE-ACETAMINOPHEN 5-325 MG PO TABS
1.0000 | ORAL_TABLET | ORAL | 0 refills | Status: AC | PRN
Start: 2023-05-14 — End: ?

## 2023-05-14 MED ORDER — DOCUSATE SODIUM 100 MG PO CAPS: 100.0000 mg | ORAL_CAPSULE | Freq: Two times a day (BID) | ORAL | 0 refills | Status: AC

## 2023-05-14 MED ORDER — CYCLOBENZAPRINE HCL 5 MG PO TABS
5.0000 mg | ORAL_TABLET | Freq: Three times a day (TID) | ORAL | Status: DC | PRN
Start: 2023-05-14 — End: 2023-05-14

## 2023-05-14 MED FILL — Thrombin For Soln 5000 Unit: CUTANEOUS | Qty: 5000 | Status: AC

## 2023-05-14 NOTE — Discharge Summary (Signed)
Physician Discharge Summary  Patient ID: April Larsen MRN: [id number] DOB/AGE: 76-Apr-1947 76 y.o.  Admit date: 05/13/2023 Discharge date: 05/14/2023  Admission Diagnoses: Cervicalgia, cervical spondylosis, cervical radiculopathy, cervical myelopathy  Discharge Diagnoses: The Principal Problem:   Cervical spondylosis with myelopathy and radiculopathy   Discharged Condition: good  Hospital Course: I performed a C3-4, C4-5, C5-6 and C6-7 anterior cervicectomy fusion and plating on the patient on 05/13/2023.  The surgery went well.  The patient's postoperative course was unremarkable.  On postoperative day 1 she felt well and requested discharge home.  She was given verbal and written discharge instructions.  All her questions were answered.  Consults: OT, care management Significant Diagnostic Studies: None Treatments: C3-4, C4-5, C5-6 and C6-7 anterior cervicectomy fusion and plating. Discharge Exam: Blood pressure 107/63, pulse 73, temperature 97.6 F (36.4 C), temperature source Oral, resp. rate 18, height 5\' 6"  (1.676 m), weight 75.8 kg, SpO2 92%. The patient is alert and pleasant.  She looks well.  Her strength is normal.  There is no hematoma or shift.  Disposition: Home  Discharge Instructions     Call MD for:  difficulty breathing, headache or visual disturbances   Complete by: As directed    Call MD for:  extreme fatigue   Complete by: As directed    Call MD for:  hives   Complete by: As directed    Call MD for:  persistant dizziness or light-headedness   Complete by: As directed    Call MD for:  persistant nausea and vomiting   Complete by: As directed    Call MD for:  redness, tenderness, or signs of infection (pain, swelling, redness, odor or green/yellow discharge around incision site)   Complete by: As directed    Call MD for:  severe uncontrolled pain   Complete by: As directed    Call MD for:  temperature >100.4   Complete by: As directed    Diet - low  sodium heart healthy   Complete by: As directed    Discharge instructions   Complete by: As directed    Call [phone] for a followup appointment. Take a stool softener while you are using pain medications.   Driving Restrictions   Complete by: As directed    Do not drive for 2 weeks.   Increase activity slowly   Complete by: As directed    Lifting restrictions   Complete by: As directed    Do not lift more than 5 pounds. No excessive bending or twisting.   May shower / Bathe   Complete by: As directed    Remove the dressing for 3 days after surgery.  You may shower, but leave the incision alone.   Remove dressing in 48 hours   Complete by: As directed       Allergies as of 05/14/2023       Reactions   Dilaudid [hydromorphone] Other (See Comments)   Hallucinations   Nsaids Other (See Comments)   Patient has had part of her colon removed    Sulfa Antibiotics Rash        Medication List     STOP taking these medications    acetaminophen 650 MG CR tablet Commonly known as: TYLENOL   Humira (2 Pen) 40 MG/0.4ML pen Generic drug: adalimumab       TAKE these medications    Allergy Relief 50 MCG/ACT nasal spray Generic drug: fluticasone Place 1 spray into both nostrils daily as needed for allergies or rhinitis.  amoxicillin 500 MG capsule Commonly known as: AMOXIL Take 2,000 mg by mouth See admin instructions. Take prior dental procedures   cholestyramine 4 g packet Commonly known as: Questran Take 1 packet (4 g total) by mouth 2 (two) times daily.   cyclobenzaprine 5 MG tablet Commonly known as: FLEXERIL Take 1 tablet (5 mg total) by mouth 3 (three) times daily as needed for muscle spasms.   docusate sodium 100 MG capsule Commonly known as: COLACE Take 1 capsule (100 mg total) by mouth 2 (two) times daily.   DULoxetine 60 MG capsule Commonly known as: CYMBALTA Take 60 mg by mouth at bedtime.   Magnesium 250 MG Tabs Take 250 mg by mouth daily.    meclizine 25 MG tablet Commonly known as: ANTIVERT Take 1 tablet (25 mg total) by mouth 3 (three) times daily as needed for dizziness or nausea.   methimazole 5 MG tablet Commonly known as: TAPAZOLE Take 2.5 mg by mouth daily.   OSTEO BI-FLEX ADV JOINT SHIELD PO Take 1 tablet by mouth daily.   oxyCODONE-acetaminophen 5-325 MG tablet Commonly known as: PERCOCET/ROXICET Take 1-2 tablets by mouth every 4 (four) hours as needed for moderate pain (pain score 4-6).   pantoprazole 40 MG tablet Commonly known as: PROTONIX Take 40 mg by mouth 2 (two) times daily.   triamterene-hydrochlorothiazide 37.5-25 MG capsule Commonly known as: DYAZIDE Take 1 capsule by mouth daily.   vitamin B-12 500 MCG tablet Commonly known as: CYANOCOBALAMIN Take 500 mcg by mouth daily.   vitamin C 1000 MG tablet Take 1,000 mg by mouth daily.   Vitamin D 50 MCG (2000 UT) tablet Take 2,000 Units by mouth daily.         Signed: Cristi Loron 05/14/2023, 7:37 AM

## 2023-05-14 NOTE — Evaluation (Signed)
Occupational Therapy Evaluation and DC Summary  Patient Details Name: April Larsen MRN: [id number] DOB: 03-25-1946 Today's Date: 05/14/2023   History of Present Illness   Pt is a 78 yo female, dx of cervical spondylosis with myelopathy, presenting to [facility]/13 for elective C3-C7 ACDF. PmHx: Anxiety, Arthritis, Skin Cancer, Anemia, Migraine Headaches, Chronic Urinary Incontinence.     Clinical Impressions PTA pt lived with her son, she reports being ind in ADLs and mobility no AD, use to have strong BUE pain. Pt now with little c/o pain, only in her neck and BUE pain has resolved. Educated pt on C spine precautions and Compensatory strategies for ADLs, she was able to complete ADLs with setup to Mod I and mobility no AD with close supervision. CGA needed for steps, recommend there some be there for safety, she does need Moderate cues to maintain c spine precautions during activity but progressively became more aware of her limits-handout and rehab number left to promote carryover. Pt has no further acute skilled OT needs, no post acute OT recommended.      If plan is discharge home, recommend the following:   Assist for transportation;Help with stairs or ramp for entrance     Functional Status Assessment   Patient has had a recent decline in their functional status and demonstrates the ability to make significant improvements in function in a reasonable and predictable amount of time.     Equipment Recommendations   None recommended by OT     Recommendations for Other Services         Precautions/Restrictions   Precautions Precautions: Cervical Precaution Booklet Issued: Yes (comment) Recall of Precautions/Restrictions:  (Verbal recall intact, functional recall impaired) Precaution/Restrictions Comments: Reviewed C spine precautions Required Braces or Orthoses: Cervical Brace Cervical Brace: Hard collar Restrictions Weight Bearing Restrictions Per Provider Order:  No     Mobility Bed Mobility Overal bed mobility: Needs Assistance Bed Mobility: Rolling, Sidelying to Sit Rolling: Supervision Sidelying to sit: Supervision       General bed mobility comments: good recall of log roll    Transfers Overall transfer level: Needs assistance Equipment used: None Transfers: Sit to/from Stand Sit to Stand: Supervision           General transfer comment: STSx2 from EOB      Balance Overall balance assessment: Needs assistance Sitting-balance support: No upper extremity supported, Feet supported Sitting balance-Leahy Scale: Good     Standing balance support: No upper extremity supported, During functional activity Standing balance-Leahy Scale: Fair                             ADL either performed or assessed with clinical judgement   ADL Overall ADL's : Needs assistance/impaired Eating/Feeding: Independent;Sitting   Grooming: Standing;Set up;Brushing hair Grooming Details (indicate cue type and reason): educated pt on the use of spit/rinse cups. 1 vc to maintain precautions while combing hair Upper Body Bathing: Standing;Supervision/ safety   Lower Body Bathing: Sitting/lateral leans;Supervison/ safety Lower Body Bathing Details (indicate cue type and reason): Dicussed need for shower seat Upper Body Dressing : Modified independent;Sitting   Lower Body Dressing: Set up;Sitting/lateral leans Lower Body Dressing Details (indicate cue type and reason): educated pt on figure four technique Toilet Transfer: Supervision/safety;Ambulation Toilet Transfer Details (indicate cue type and reason): close supervision Toileting- Clothing Manipulation and Hygiene: Supervision/safety;Sit to/from stand       Functional mobility during ADLs: Supervision/safety General ADL Comments: Educated  pt on how to doff/don aspen collar and replace attachments, discussed modifying home setup to keep things shoulder level. Pt negotiated 3 steps with  CGA for safety + bilat rails     Vision         Perception         Praxis         Pertinent Vitals/Pain Pain Assessment Pain Assessment: Faces Faces Pain Scale: Hurts a little bit Pain Location: neck Pain Descriptors / Indicators: Aching Pain Intervention(s): Monitored during session, Repositioned     Extremity/Trunk Assessment Upper Extremity Assessment Upper Extremity Assessment: Overall WFL for tasks assessed   Lower Extremity Assessment Lower Extremity Assessment: Overall WFL for tasks assessed   Cervical / Trunk Assessment Cervical / Trunk Assessment: Neck Surgery   Communication Communication Communication: No apparent difficulties   Cognition Arousal: Alert Behavior During Therapy: WFL for tasks assessed/performed Cognition: No apparent impairments             OT - Cognition Comments: Decreased recall of precautions during functional activity                 Following commands: Intact       Cueing  General Comments   Cueing Techniques: Verbal cues      Exercises     Shoulder Instructions      Home Living Family/patient expects to be discharged to:: Private residence Living Arrangements: Children Available Help at Discharge: Family;Available PRN/intermittently (son) Type of Home: House Home Access: Stairs to enter [employer] of Steps: 3 Entrance Stairs-Rails: Can reach both Home Layout: One level     Bathroom Shower/Tub: Chief Strategy Officer: Handicapped height (vanity nearby) Bathroom Accessibility: Yes   Home Equipment: Agricultural consultant (2 wheels);Cane - single point;Grab bars - tub/shower          Prior Functioning/Environment Prior Level of Function : Independent/Modified Independent;Driving             Mobility Comments: ind no AD ADLs Comments: Ind    OT Problem List: Pain;Impaired balance (sitting and/or standing);Decreased knowledge of precautions   OT Treatment/Interventions:         OT Goals(Current goals can be found in the care plan section)   Acute Rehab OT Goals Patient Stated Goal: To go home OT Goal Formulation: All assessment and education complete, DC therapy Time For Goal Achievement: 05/28/23 Potential to Achieve Goals: Good   OT Frequency:       Co-evaluation              AM-PAC OT "6 Clicks" Daily Activity     Outcome Measure Help from another person eating meals?: None Help from another person taking care of personal grooming?: A Little Help from another person toileting, which includes using toliet, bedpan, or urinal?: A Little Help from another person bathing (including washing, rinsing, drying)?: A Little Help from another person to put on and taking off regular upper body clothing?: None Help from another person to put on and taking off regular lower body clothing?: A Little 6 Click Score: 20   End of Session Equipment Utilized During Treatment: Gait belt Nurse Communication: Mobility status  Activity Tolerance: Patient tolerated treatment well Patient left: in bed;with call bell/phone within reach  OT Visit Diagnosis: Unsteadiness on feet (R26.81);Pain Pain - Right/Left:  (neck)                Time: 4098-1191 OT Time Calculation (min): 42 min Charges:  OT General Charges $OT  Visit: 1 Visit OT Evaluation $OT Eval Low Complexity: 1 Low OT Treatments $Self Care/Home Management : 8-22 mins $Therapeutic Activity: 8-22 mins  05/14/2023  AB, OTR/L  Acute Rehabilitation Services  Office: [phone]   Tristan Schroeder 05/14/2023, 10:12 AM

## 2023-05-14 NOTE — Anesthesia Postprocedure Evaluation (Signed)
Anesthesia Post Note  Patient: April Larsen  Procedure(s) Performed: ANTERIOR CERVICAL DECOMPRESSION/DISCECTOMY Aretha Parrot PROSTHESIS,PLATE SCREWS CERVICAL THREE-FOUR,CERVICAL FOUR-FIVE,CERVICAL FIVE-SIX,CERVICAL SIX-SEVEN (Spine Cervical)     Patient location during evaluation: PACU Anesthesia Type: General Level of consciousness: awake and alert Pain management: pain level controlled Vital Signs Assessment: post-procedure vital signs reviewed and stable Respiratory status: spontaneous breathing, nonlabored ventilation, respiratory function stable and patient connected to nasal cannula oxygen Cardiovascular status: blood pressure returned to baseline and stable Postop Assessment: no apparent nausea or vomiting Anesthetic complications: no   No notable events documented.  Last Vitals:  Vitals:   05/14/23 0427 05/14/23 0800  BP: 107/63 (!) 97/51  Pulse: 73 84  Resp: 18 20  Temp: 36.4 C 36.4 C  SpO2: 92% 98%    Last Pain:  Vitals:   05/14/23 0946  TempSrc:   PainSc: 6                  [place]

## 2023-05-14 NOTE — Progress Notes (Signed)
Patient alert and oriented, mae's well, voiding adequate amount of urine, swallowing without difficulty, no c/o pain at time of discharge. Patient discharged home with family. Script and discharged instructions given to patient. Patient and family stated understanding of instructions given. Patient has an appointment with Dr. Lovell Sheehan in 3 weeks

## 2023-05-14 NOTE — Plan of Care (Signed)

## 2023-05-17 DIAGNOSIS — R300 Dysuria: Secondary | ICD-10-CM | POA: Diagnosis not present

## 2023-05-24 ENCOUNTER — Telehealth: Payer: Self-pay | Admitting: Gastroenterology

## 2023-05-24 NOTE — Telephone Encounter (Signed)
 The patient called and left a voicemail requesting to speak to Morrie Sheldon because she just had surgery they prescribe her oxycodone. The patient wanted to know if it is okay to take her shots. I called her back to let her know that we received his message and sent the message to the nurse.

## 2023-05-25 NOTE — Telephone Encounter (Signed)
 Patient had surgery on 05/13/2023 and her doctor wanted her off the Humira 4 weeks. She states when she left the hospital he told her to go back on the Humira 2 weeks after leaving the hospital. She states she is taking the oxycodone for the pain and wants to know if it is okay to take the medication with the Humira. She also wants to know if she can take the Humira today she was discharge from the hospital on 05/14/2023

## 2023-05-25 NOTE — Telephone Encounter (Signed)
 Okay to restart Humira and oxycodone as needed only  RV

## 2023-05-25 NOTE — Telephone Encounter (Signed)
 Patient verbalized understanding of instructions

## 2023-06-03 ENCOUNTER — Other Ambulatory Visit: Payer: Self-pay

## 2023-06-03 ENCOUNTER — Other Ambulatory Visit: Payer: Self-pay | Admitting: Gastroenterology

## 2023-06-04 DIAGNOSIS — M4712 Other spondylosis with myelopathy, cervical region: Secondary | ICD-10-CM | POA: Diagnosis not present

## 2023-06-07 ENCOUNTER — Encounter: Payer: Self-pay | Admitting: Gastroenterology

## 2023-06-07 ENCOUNTER — Ambulatory Visit: Payer: PPO | Admitting: Gastroenterology

## 2023-06-07 VITALS — BP 125/80 | HR 101 | Temp 98.2°F | Ht 65.0 in | Wt 167.4 lb

## 2023-06-07 DIAGNOSIS — K5100 Ulcerative (chronic) pancolitis without complications: Secondary | ICD-10-CM | POA: Diagnosis not present

## 2023-06-07 DIAGNOSIS — Z8719 Personal history of other diseases of the digestive system: Secondary | ICD-10-CM | POA: Diagnosis not present

## 2023-06-07 NOTE — Progress Notes (Signed)
 [name], MD [address]  [address]  [place], [place] [address]  Main: [phone]  Fax: [phone]    Gastroenterology Consultation  Referring Provider:     Danella Penton, MD Primary Care Physician:  Danella Penton, MD Primary Gastroenterologist:  Dr. [name] Reason for Consultation: Ulcerative colitis        HPI:   April Larsen is a 78 y.o. female referred by Dr. Hyacinth Meeker, Hardin Negus, MD  for consultation & management of ulcerative colitis.    History of present illness: April Larsen is here for follow-up of ulcerative colitis.  Patient reports feeling significantly better since increasing Humira to weekly from 09/2022, and adding budesonide 9 mg daily.  Her budesonide has been tapered off.  She called our office back in mid October due to flareup of diarrhea, stool studies were negative for infection as well as fecal calprotectin levels were normal.  These levels have significantly improved from 1030 which were checked end of June 2024.  I restarted her on budesonide 9 mg daily, her diarrhea has resolved.  Her Humira trough levels have been therapeutic on weekly dosing with undetectable antibodies. She reports that her diarrhea has resolved.    Follow-up visit 06/07/2023 [name] is here for follow-up of ulcerative colitis.  She had mild flareup of colitis symptoms after her C-spine surgery.  She restarted weekly Humira and I started her on cholestyramine 4 g packet once a day, the combination relieved her diarrhea and she is very pleased with it.  She is recovering from her C-spine surgery, has been wearing c-collar.  By this week, it would be 4 weeks and she started Humira 2 weeks after the surgery.  She does not have any concerns today.  She also notices consuming processed sugars or sweets increases her diarrhea.  Her weight has been stable  IBD diagnosis: Ulcerative colitis 2013  Disease course: Patient has longstanding history of ulcerative colitis for more  than 10 years, previously managed by [facility] gastroenterology.  She was on Asacol, later switched to Apriso.  Patient was previously seen by Dr. Maximino Greenland until 2022.  Her last colonoscopy from 08/22/2020 revealed chronic colitis with mild activity in the cecum and ascending colon.  Chronic colitis without activity and rest of the colon.  TI was normal.  Patient stayed on Apriso 0.375 g 4 pills daily.  Patient states that her ulcerative colitis has been in remission until she had lumbar fusion surgery in December 2022 for severe lumbar scoliosis.  Since surgery, she had 2 hospitalizations secondary to fall.  Since end of April 2023, patient reports that she has noticed flareup of her colitis symptoms including diarrhea, abdominal cramps, episodes of incontinence.  She denies any rectal bleeding.  She also developed hyperthyroidism and started on methimazole.  She has iron deficiency without anemia.  Patient is also on long term colestipol and sucralfate and not sure if these are helping.  Patient underwent hiatal hernia repair as well as sigmoid colectomy in 2018.  She underwent colonoscopy on 01/22/2022, which revealed chronic mild active colitis.  She had a flareup of ulcerative colitis after back surgery at that time which prompted colonoscopy.  Also, her fecal calprotectin levels were elevated at 145.  She received short course of prednisone followed by tried to start her on Uceris.  It was expensive.  I started her on Entocort 3 mg 3 pills daily which she has been taking since November.  Patient  gained about 15 to 20 pounds.  She reports having diarrheal episodes and Entocort is not helping much.  She was taking Imodium and Pepto-Bismol as needed for diarrhea.  She has postprandial urgency, sometimes nocturnal diarrhea, denies any rectal bleeding.  She denies any abdominal pain. Patient underwent knee replacement on 05/13/2022 by Dr. Ernest Pine. She underwent workup of iron deficiency anemia, found to have  small nonbleeding AVM in the proximal small bowel.  She had a video capsule endoscopy, capsule was retained in the cecum for few months and finally passed.  Humira was initiated on 06/30/2022 with standard induction and biweekly maintenance dose.  Due to ongoing diarrhea short course of low-dose prednisone 20mg  daily was initiated.  Her symptoms improved.  Developed recurrence of diarrhea on 09/10/2022, noticed improvement in her symptoms in first week after taking medication, develops breakthrough diarrhea during second week, worse closer to her next shot.  About 4-5 episodes of watery diarrhea with no relation to what she eats associated with left lower quadrant pain.  Imodium did not help.  She has significantly elevated fecal calprotectin levels of 1030 on 09/23/2022.  Humira trough levels were 6.7, undetectable antibodies.  Repeat stool studies came back negative for infection.  Therefore, Humira has been increased to every week and started on budesonide 9 mg daily.  Fecal calprotectin levels are normal as of 01/13/2023  Extra intestinal manifestations: None  IBD surgical history: Sigmoid colectomy in 2018  Imaging:  MRE none CTE none SBFT none  Procedures:  EGD and colonoscopy 01/22/2022 DIAGNOSIS: A. DUODENUM; COLD BIOPSY: - ENTERIC MUCOSA WITH PRESERVED VILLOUS ARCHITECTURE AND NO SIGNIFICANT HISTOPATHOLOGIC CHANGE. - NEGATIVE FOR FEATURES OF CELIAC, DYSPLASIA, AND MALIGNANCY.  B. STOMACH, RANDOM BIOPSY: - GASTRIC ANTRAL MUCOSA WITH FEATURES OF MILD REACTIVE GASTROPATHY. - GASTRIC OXYNTIC MUCOSA WITH CHANGES OF PPI EFFECT. - NEGATIVE FOR H. PYLORI, DYSPLASIA, AND MALIGNANCY.  C. COLON, RIGHT; COLD BIOPSY: - CHRONIC COLITIS WITH MILD ACTIVITY (CRYPTITIS). - NEGATIVE FOR GRANULOMA, DYSPLASIA, AND MALIGNANCY.  D. COLON POLYP, DESCENDING; COLD SNARE: - POLYPOID FRAGMENTS OF BENIGN COLONIC MUCOSA WITH SUPERFICIAL REACTIVE CHANGES AND SMALL LYMPHOID AGGREGATE. - NEGATIVE FOR DYSPLASIA  AND MALIGNANCY.  E. COLON, LEFT; COLD BIOPSY: - CHRONIC COLITIS WITH MILD ACTIVITY (FOCAL CRYPTITIS). - NEGATIVE FOR GRANULOMA, DYSPLASIA, AND MALIGNANCY.    Sigmoid colectomy 11/24/2016 DIAGNOSIS:  A.  SIGMOID COLON; LAPAROSCOPIC LOW ANTERIOR RESECTION:  - DIVERTICULITIS.  - SEROSAL ADHESIONS.  - REACTIVE LYMPH NODE.  - UNREMARKABLE RESECTION MARGINS.   Upper endoscopy 02/07/2020 - Normal esophagus. - 5 cm hiatal hernia. - Erythematous mucosa in the antrum. Biopsied. - A single gastric polyp. Biopsied. - Normal duodenal bulb, second portion of the duodenum and examined duodenum. - Biopsies were obtained in the gastric body, at the incisura and in the gastric antrum. DIAGNOSIS:  A. STOMACH ERYTHEMA; COLD BIOPSY:  - REACTIVE GASTROPATHY.  - NEGATIVE FOR ACTIVE INFLAMMATION AND H PYLORI.  - NEGATIVE FOR INTESTINAL METAPLASIA, DYSPLASIA, AND MALIGNANCY.   B. STOMACH POLYP; COLD BIOPSY:  - HYPERPLASTIC POLYP.  - NEGATIVE FOR DYSPLASIA AND MALIGNANCY.  Colonoscopy 08/22/2020 - Erythematous mucosa in the ascending colon and in the cecum. Biopsied. - Diverticulosis in the sigmoid colon. - Patent surgical anastomosis, characterized by erythema. - The examination was otherwise normal. - The rectum, sigmoid colon, descending colon, transverse colon and terminal ileum are normal. Biopsied. - Non-bleeding internal hemorrhoids. DIAGNOSIS:  A. TERMINAL ILEUM; COLD BIOPSY:  - ENTERIC MUCOSA WITH NORMAL VILLOUS ARCHITECTURE AND NO SIGNIFICANT  HISTOPATHOLOGIC CHANGE.  -  SINGLE FRAGMENT OF COLONIC MUCOSA WITH NO SIGNIFICANT HISTOPATHOLOGIC  CHANGE.  - NEGATIVE FOR ACTIVE ILEITIS/COLITIS.  - NEGATIVE FOR GRANULOMA, DYSPLASIA, AND MALIGNANCY.   B. COLON, CECUM AND ASCENDING; COLD BIOPSY:  - CHRONIC COLITIS WITH MILD ACTIVITY (FOCAL CRYPTITIS AND SUPERFICIAL  ACTIVE MUCOSAL INFLAMMATION).  - NEGATIVE FOR GRANULOMA, DYSPLASIA, AND MALIGNANCY.   C. COLON, TRANSVERSE AND DESCENDING; COLD  BIOPSY:  - CHRONIC COLITIS WITHOUT ACTIVITY.  - NEGATIVE FOR GRANULOMA, DYSPLASIA, AND MALIGNANCY.   D. COLON, RECTOSIGMOID; COLD BIOPSY:  - CHRONIC PROCTITIS WITHOUT ACTIVITY.  - NEGATIVE FOR GRANULOMA, DYSPLASIA, AND MALIGNANCY.    VCE 05/15/2022, study is incomplete, capsule in the stomach Repeat video capsule endoscopy 06/24/2022 Study is incomplete, capsule did not reach cecum Capsule was placed endoscopically and second portion of duodenum Solitary nonbleeding small bowel AVM was found in the proximal small bowel  IBD medications:  Steroids: Responsive to prednisone, budesonide 5-ASA: Treated with Apriso in the past  Immunomodulators: AZA, methotrexate nave TPMT status unknown Biologics:  Anti TNFs: Humira initiated on 06/30/2022, increased to every week in 09/2022 Anti Integrins: None Ustekinumab: None Tofactinib: None Clinical trial: None    NSAIDs: None  Antiplts/Anticoagulants/Anti thrombotics: None   Past Medical History:  Diagnosis Date   Actinic keratosis    Anemia    Anxiety    Arthritis    Cancer (HCC) 2016   skin   Cataract (lens) fragments in eye following cataract surgery, bilateral 2013   one eye done then 4 weeks later the other eye done.   Chronic ulcerative colitis, without complications (HCC) 10/29/2015   Diverticulitis 2018   Esophagitis, reflux 10/04/2014   Family history of adverse reaction to anesthesia    SISTER HARD TO WAKE UP and nausea and vomiting.   Female stress incontinence 08/09/2013   Gastroesophageal reflux disease without esophagitis 08/06/2016   GERD (gastroesophageal reflux disease)    attributed to Hiatal Hernia   Headache    MIGRAINE   History of hiatal hernia    Hypertension    Hyperthyroidism    Incomplete emptying of bladder 10/29/2013   Migraine 07/26/2013   Pneumonia due to COVID-19 virus 2020   PONV (postoperative nausea and vomiting)    nausea, does not remember vomiting.   Postherpetic neuralgia    RIGHT  7TH.CRANIAL NERVE from shingles followed by Bell's palsy   Postmenopausal 11/09/2016   PVC (premature ventricular contraction)    Scoliosis    Scoliosis (and kyphoscoliosis), idiopathic 07/26/2013   Scoliosis of lumbosacral spine 01/18/2015   Squamous cell carcinoma of skin 03/13/2013   L clavicle    Squamous cell carcinoma of skin 03/10/2018   L med knee   Squamous cell carcinoma of skin 02/21/2019   R cheek    Squamous cell carcinoma of skin 06/01/2019   L mid lat pretibial - ED&C   Trochanteric bursitis 11/29/2014   Ulcerative colitis (HCC)    Urethral prolapse 08/09/2013   Urge incontinence 08/09/2013    Past Surgical History:  Procedure Laterality Date   ABDOMINAL HYSTERECTOMY     ANTERIOR CERVICAL DECOMPRESSION/DISCECTOMY FUSION 4 LEVELS N/A 05/13/2023   Procedure: ANTERIOR CERVICAL DECOMPRESSION/DISCECTOMY Aretha Parrot PROSTHESIS,PLATE SCREWS CERVICAL THREE-FOUR,CERVICAL FOUR-FIVE,CERVICAL FIVE-SIX,CERVICAL SIX-SEVEN;  Surgeon: Tressie Stalker, MD;  Location: [facility];  Service: Neurosurgery;  Laterality: N/A;   BACK SURGERY  03/12/2021   L3-L4 AND L3-S1 DECOMPRESSION   BIOPSY  03/01/2023   Procedure: BIOPSY;  Surgeon: Toney Reil, MD;  Location: [facility] ENDOSCOPY;  Service: Gastroenterology;;   Fidela Salisbury  RELEASE Bilateral    CATARACT EXTRACTION W/ INTRAOCULAR LENS  IMPLANT, BILATERAL     CHOLECYSTECTOMY N/A 08/21/2016   Procedure: LAPAROSCOPIC CHOLECYSTECTOMY WITH INTRAOPERATIVE CHOLANGIOGRAM;  Surgeon: Nadeen Landau, MD;  Location: [facility] ORS;  Service: General;  Laterality: N/A;   COLONOSCOPY  11/06/2005   COLONOSCOPY  2010   COLONOSCOPY  2013   COLONOSCOPY WITH PROPOFOL N/A 07/29/2016   Procedure: COLONOSCOPY WITH PROPOFOL;  Surgeon: Scot Jun, MD;  Location: [facility] ENDOSCOPY;  Service: Endoscopy;  Laterality: N/A;   COLONOSCOPY WITH PROPOFOL N/A 08/22/2020   Procedure: COLONOSCOPY WITH PROPOFOL;  Surgeon: Pasty Spillers, MD;  Location: [facility]  ENDOSCOPY;  Service: Endoscopy;  Laterality: N/A;   COLONOSCOPY WITH PROPOFOL N/A 01/22/2022   Procedure: COLONOSCOPY WITH PROPOFOL;  Surgeon: Toney Reil, MD;  Location: [facility] SURGERY CNTR;  Service: Endoscopy;  Laterality: N/A;   COLONOSCOPY WITH PROPOFOL N/A 07/23/2022   Procedure: COLONOSCOPY WITH PROPOFOL;  Surgeon: Toney Reil, MD;  Location: [facility] SURGERY CNTR;  Service: Endoscopy;  Laterality: N/A;   COLONOSCOPY WITH PROPOFOL N/A 03/01/2023   Procedure: COLONOSCOPY WITH PROPOFOL;  Surgeon: Toney Reil, MD;  Location: [facility] ENDOSCOPY;  Service: Gastroenterology;  Laterality: N/A;   ESOPHAGOGASTRODUODENOSCOPY  11/06/2005   ESOPHAGOGASTRODUODENOSCOPY N/A 06/24/2022   Procedure: ESOPHAGOGASTRODUODENOSCOPY (EGD);  Surgeon: Toney Reil, MD;  Location: [facility] ENDOSCOPY;  Service: Gastroenterology;  Laterality: N/A;   ESOPHAGOGASTRODUODENOSCOPY (EGD) WITH PROPOFOL N/A 07/29/2016   Procedure: ESOPHAGOGASTRODUODENOSCOPY (EGD) WITH PROPOFOL;  Surgeon: Scot Jun, MD;  Location: [facility] ENDOSCOPY;  Service: Endoscopy;  Laterality: N/A;   ESOPHAGOGASTRODUODENOSCOPY (EGD) WITH PROPOFOL N/A 02/07/2020   Procedure: ESOPHAGOGASTRODUODENOSCOPY (EGD) WITH PROPOFOL;  Surgeon: Pasty Spillers, MD;  Location: [facility] ENDOSCOPY;  Service: Endoscopy;  Laterality: N/A;   ESOPHAGOGASTRODUODENOSCOPY (EGD) WITH PROPOFOL N/A 01/22/2022   Procedure: ESOPHAGOGASTRODUODENOSCOPY (EGD) WITH PROPOFOL;  Surgeon: Toney Reil, MD;  Location: [facility] SURGERY CNTR;  Service: Endoscopy;  Laterality: N/A;   EYE SURGERY     GIVENS CAPSULE STUDY N/A 05/07/2022   Procedure: GIVENS CAPSULE STUDY;  Surgeon: Toney Reil, MD;  Location: [facility] ENDOSCOPY;  Service: Gastroenterology;  Laterality: N/A;   GIVENS CAPSULE STUDY N/A 06/24/2022   Procedure: GIVENS CAPSULE STUDY;  Surgeon: Toney Reil, MD;  Location: [facility] ENDOSCOPY;  Service: Gastroenterology;  Laterality: N/A;   KNEE  ARTHROPLASTY Right 05/13/2022   Procedure: COMPUTER ASSISTED TOTAL KNEE ARTHROPLASTY;  Surgeon: Donato Heinz, MD;  Location: [facility] ORS;  Service: Orthopedics;  Laterality: Right;   LAPAROSCOPIC LOW ANTERIOR RESECTION  11/24/2016   Procedure: LAPAROSCOPIC LOW ANTERIOR RESECTION;  Surgeon: Leafy Ro, MD;  Location: [facility] ORS;  Service: General;;   LUMBAR LAMINECTOMY  2004   L3-4   PARTIAL COLECTOMY  2018   POLYPECTOMY  01/22/2022   Procedure: POLYPECTOMY;  Surgeon: Toney Reil, MD;  Location: [facility] SURGERY CNTR;  Service: Endoscopy;;   POLYPECTOMY  03/01/2023   Procedure: POLYPECTOMY;  Surgeon: Toney Reil, MD;  Location: [facility] ENDOSCOPY;  Service: Gastroenterology;;   POSTERIOR LAMINECTOMY / DECOMPRESSION LUMBAR SPINE  2009   L3-S1   TONSILLECTOMY     XI ROBOTIC ASSISTED PARAESOPHAGEAL HERNIA REPAIR N/A 02/27/2020   Procedure: XI ROBOTIC ASSISTED PARAESOPHAGEAL HERNIA REPAIR;  Surgeon: Leafy Ro, MD;  Location: [facility] ORS;  Service: General;  Laterality: N/A;     Current Outpatient Medications:    adalimumab (HUMIRA, 2 PEN,) 40 MG/0.8ML AJKT pen, , Disp: , Rfl:    amoxicillin (AMOXIL) 500 MG capsule, Take 2,000  mg by mouth See admin instructions. Take prior dental procedures, Disp: , Rfl:    cholestyramine (QUESTRAN) 4 g packet, MIX 1 PACKET IN LIQUID AND TAKE BY MOUTH TWICE DAILY, Disp: 60 each, Rfl: 0   docusate sodium (COLACE) 100 MG capsule, Take 1 capsule (100 mg total) by mouth 2 (two) times daily., Disp: 10 capsule, Rfl: 0   DULoxetine (CYMBALTA) 60 MG capsule, Take 60 mg by mouth at bedtime. , Disp: , Rfl: 3   fluticasone (ALLERGY RELIEF) 50 MCG/ACT nasal spray, Place 1 spray into both nostrils daily as needed for allergies or rhinitis., Disp: , Rfl:    meclizine (ANTIVERT) 25 MG tablet, Take 1 tablet (25 mg total) by mouth 3 (three) times daily as needed for dizziness or nausea., Disp: 30 tablet, Rfl: 0   methimazole (TAPAZOLE) 5 MG tablet, Take 2.5 mg by  mouth daily., Disp: , Rfl:    oxyCODONE-acetaminophen (PERCOCET/ROXICET) 5-325 MG tablet, Take 1-2 tablets by mouth every 4 (four) hours as needed for moderate pain (pain score 4-6)., Disp: 30 tablet, Rfl: 0   pantoprazole (PROTONIX) 40 MG tablet, Take 40 mg by mouth 2 (two) times daily., Disp: , Rfl:    triamterene-hydrochlorothiazide (DYAZIDE) 37.5-25 MG capsule, Take 1 capsule by mouth daily., Disp: , Rfl:    vitamin B-12 (CYANOCOBALAMIN) 500 MCG tablet, Take 500 mcg by mouth daily., Disp: , Rfl:    Ascorbic Acid (VITAMIN C) 1000 MG tablet, Take 1,000 mg by mouth daily. (Patient not taking: Reported on 06/07/2023), Disp: , Rfl:    Cholecalciferol (VITAMIN D) 50 MCG (2000 UT) tablet, Take 2,000 Units by mouth daily. (Patient not taking: Reported on 06/07/2023), Disp: , Rfl:    Magnesium 250 MG TABS, Take 250 mg by mouth daily. (Patient not taking: Reported on 06/07/2023), Disp: , Rfl:    Misc Natural Products (OSTEO BI-FLEX ADV JOINT SHIELD PO), Take 1 tablet by mouth daily. (Patient not taking: Reported on 06/07/2023), Disp: , Rfl:    Family History  Problem Relation Age of Onset   Breast cancer Sister 36   Breast cancer Paternal Aunt 50   Breast cancer Cousin        1st paternal cousin   Bladder Cancer Neg Hx    Prostate cancer Neg Hx    Kidney cancer Neg Hx      Social History   Tobacco Use   Smoking status: Never   Smokeless tobacco: Never  Vaping Use   Vaping status: Never Used  Substance Use Topics   Alcohol use: No   Drug use: No    Allergies as of 06/07/2023 - Review Complete 06/07/2023  Allergen Reaction Noted   Dilaudid [hydromorphone] Other (See Comments) 08/11/2021   Nsaids Other (See Comments) 01/05/2020   Sulfa antibiotics Rash 08/24/2015    Review of Systems:    All systems reviewed and negative except where noted in HPI.   Physical Exam:  BP 125/80 (BP Location: Right Arm, Patient Position: Sitting, Cuff Size: Normal)   Pulse (!) 101   Temp 98.2 F (36.8  C) (Oral)   Ht 5\' 5"  (1.651 m)   Wt 167 lb 6 oz (75.9 kg)   BMI 27.85 kg/m  No LMP recorded. Patient has had a hysterectomy.  General:   Alert,  Well-developed, well-nourished, pleasant and cooperative in NAD Head:  Normocephalic and atraumatic. Eyes:  Sclera clear, no icterus.   Conjunctiva pink. Ears:  Normal auditory acuity. Nose:  No deformity, discharge, or lesions. Mouth:  No  deformity or lesions,oropharynx pink & moist. Neck:  Supple; no masses or thyromegaly. Lungs:  Respirations even and unlabored.  Clear throughout to auscultation.   No wheezes, crackles, or rhonchi. No acute distress. Heart:  Regular rate and rhythm; no murmurs, clicks, rubs, or gallops. Abdomen:  Normal bowel sounds. Soft, non-tender and non-distended without masses, hepatosplenomegaly or hernias noted.  No guarding or rebound tenderness.   Rectal: Not performed Msk:  Symmetrical without gross deformities. Good, equal movement & strength bilaterally. Pulses:  Normal pulses noted. Extremities:  No clubbing or edema.  No cyanosis. Neurologic:  Alert and oriented x3;  grossly normal neurologically. Skin:  Intact without significant lesions or rashes. No jaundice. Psych:  Alert and cooperative. Normal mood and affect.  Imaging Studies: No recent abdominal imaging  Assessment and Plan:   DORTHA NEIGHBORS is a 78 y.o. female with longstanding history of ulcerative colitis, maintained on mesalamine, lumbar scoliosis s/p lumbar fusion in December 2022, large hiatal hernia s/p repair, sigmoid diverticulitis s/p sigmoid colectomy in 2018 is seen in consultation for ulcerative pan colitis and history of iron deficiency anemia.  Exacerbation since her back surgery in mid 2023. Stool studies negative for infection including C. Difficile. Colonoscopy revealed chronic mild active pan colitis in 12/2021. Given ongoing diarrhea and breakthrough symptoms 1 week before the next shot, subtherapeutic Humira levels and  significantly elevated fecal calprotectin levels, Humira has been increased to weekly since 09/2022 Started on budesonide 9 mg daily followed by taper after she attained clinical remission Another flareup in October 2024, stool studies negative for infection, restarted budesonide which helped improving diarrhea. Humira trough levels are therapeutic and undetectable antibodies   Ulcerative colitis: In clinical remission  Fecal calprotectin levels normalized in 12/2022 Recheck fecal calprotectin levels today Continue weekly Humira along with cholestyramine 4 g packet daily separate from other medications  Check CBC, LFTs every 3 months Recheck LFTs today  She is up-to-date with vaccinations Iron deficiency anemia as well as B12 deficiency: Resolved EGD and colonoscopy did not reveal source of anemia First video capsule endoscopy, capsule did not leave stomach.  Subsequently, underwent endoscopic placement of video capsule on 06/24/2022.  Revealed a solitary small nonbleeding AVM in the proximal small bowel.  Capsule did not reach cecum.  Serial x-rays revealed retained capsule in the cecum which is finally evacuated spontaneously.   Follow up in 6 months   [name], MD

## 2023-06-08 DIAGNOSIS — K5100 Ulcerative (chronic) pancolitis without complications: Secondary | ICD-10-CM | POA: Diagnosis not present

## 2023-06-08 LAB — HEPATIC FUNCTION PANEL
ALT: 16 IU/L (ref 0–32)
AST: 20 IU/L (ref 0–40)
Albumin: 4.6 g/dL (ref 3.8–4.8)
Alkaline Phosphatase: 171 IU/L — ABNORMAL HIGH (ref 44–121)
Bilirubin Total: 0.4 mg/dL (ref 0.0–1.2)
Bilirubin, Direct: 0.16 mg/dL (ref 0.00–0.40)
Total Protein: 7.6 g/dL (ref 6.0–8.5)

## 2023-06-09 ENCOUNTER — Ambulatory Visit: Payer: PPO | Admitting: Obstetrics and Gynecology

## 2023-06-10 LAB — CALPROTECTIN, FECAL: Calprotectin, Fecal: 394 ug/g — ABNORMAL HIGH (ref 0–120)

## 2023-06-15 ENCOUNTER — Telehealth: Payer: Self-pay

## 2023-06-15 DIAGNOSIS — R748 Abnormal levels of other serum enzymes: Secondary | ICD-10-CM

## 2023-06-15 MED ORDER — PREDNISONE 10 MG PO TABS: ORAL_TABLET | ORAL | 0 refills | Status: AC

## 2023-06-15 NOTE — Addendum Note (Signed)
 Addended by: Radene Knee L on: 06/15/2023 12:58 PM   Modules accepted: Orders

## 2023-06-15 NOTE — Telephone Encounter (Signed)
 Patient states it depends what she eats and she will diarrhea. She states she does not eat beef or pork only eats chicken if she eats meat. She states she tries to watch what she eats. She states she will have diarrhea 2 to 3 times a week. She states she saw mychart that her fecal calprotectin levels were elevated and on her blood work Alkaline Phosphatase is elevated.

## 2023-06-15 NOTE — Telephone Encounter (Signed)
 She probably has a mild flareup causing elevated fecal calprotectin levels in setting of recent surgery.  Please check with her if she would like to try a short course of prednisone like 20 mg daily for 2 weeks Recommend to check vitamin D levels, serum GGT and alkaline phosphatase isoenzymes for elevated alkaline phosphatase levels  RV

## 2023-06-15 NOTE — Telephone Encounter (Signed)
 Sent medication to the pharmacy for Prednisone and order the lab work. Patient verbalized understanding of results and states she would like to take Prednisone

## 2023-06-16 DIAGNOSIS — R748 Abnormal levels of other serum enzymes: Secondary | ICD-10-CM | POA: Diagnosis not present

## 2023-06-20 LAB — ALKALINE PHOSPHATASE, ISOENZYMES
Alkaline Phosphatase: 157 IU/L — ABNORMAL HIGH (ref 44–121)
BONE FRACTION: 62 % (ref 14–68)
INTESTINAL FRAC.: 3 % (ref 0–18)
LIVER FRACTION: 35 % (ref 18–85)

## 2023-06-20 LAB — GAMMA GT: GGT: 18 IU/L (ref 0–60)

## 2023-06-20 LAB — VITAMIN D 25 HYDROXY (VIT D DEFICIENCY, FRACTURES): Vit D, 25-Hydroxy: 29.9 ng/mL — ABNORMAL LOW (ref 30.0–100.0)

## 2023-06-21 ENCOUNTER — Telehealth: Payer: Self-pay

## 2023-06-21 NOTE — Telephone Encounter (Signed)
 Patient verbalized understanding of results

## 2023-06-21 NOTE — Telephone Encounter (Signed)
-----   Message from [facility] sent at 06/21/2023  9:53 AM EDT ----- Vitamin D levels are low normal, continue to take over-the-counter vitamin D supplements daily.  Alkaline phosphatase levels are mildly elevated secondary to low vitamin D levels.  Nothing to worry  RV

## 2023-07-03 ENCOUNTER — Other Ambulatory Visit: Payer: Self-pay | Admitting: Gastroenterology

## 2023-07-07 ENCOUNTER — Telehealth: Payer: Self-pay | Admitting: Gastroenterology

## 2023-07-07 DIAGNOSIS — K5100 Ulcerative (chronic) pancolitis without complications: Secondary | ICD-10-CM

## 2023-07-07 NOTE — Telephone Encounter (Signed)
 Pt requesting call back to discuss something stronger than pretnozone it stopped working

## 2023-07-08 DIAGNOSIS — N390 Urinary tract infection, site not specified: Secondary | ICD-10-CM | POA: Diagnosis not present

## 2023-07-08 DIAGNOSIS — R319 Hematuria, unspecified: Secondary | ICD-10-CM | POA: Diagnosis not present

## 2023-07-08 NOTE — Telephone Encounter (Signed)
 Patient just finished the Prednisone on yesterday and is having soft stool 5 to 6 times a day. She states sometimes they are forming then others. She states she has one or two true diarrhea this week. She is very careful on what she eats and this morning she ate 2 eggs and a piece of toast. She states she is having LLQ pain that is a dull pain that is worse at night but will be off and on during the day. She is still taking the Humira once a week and the [place] packet. She states she thinks she has UTI because she is having frequent urination, blood in urine and burning with urination. She is going to go to a urgent care now to get that looked at. She wants to know what you recommend because she feels like her symptoms are not controlled since she is having more symptoms now.

## 2023-07-08 NOTE — Telephone Encounter (Signed)
 Called and left a message for call back

## 2023-07-09 NOTE — Telephone Encounter (Signed)
 I am worried if Humira is not working for her anymore.  Please recheck fecal calprotectin levels  RV

## 2023-07-09 NOTE — Telephone Encounter (Signed)
 The patient called to inform Morrie Sheldon that she visited the walk-in clinic at [facility] and was diagnosed with a UTI. She was prescribed a 10-day course of antibiotics. The patient reported that she is still experiencing bleeding and some ongoing issues with her colitis. While she is not currently having diarrhea, she mentioned that she is still having frequent bowel movements.

## 2023-07-11 DIAGNOSIS — R319 Hematuria, unspecified: Secondary | ICD-10-CM | POA: Diagnosis not present

## 2023-07-11 DIAGNOSIS — N390 Urinary tract infection, site not specified: Secondary | ICD-10-CM | POA: Diagnosis not present

## 2023-07-12 DIAGNOSIS — K5190 Ulcerative colitis, unspecified, without complications: Secondary | ICD-10-CM | POA: Diagnosis not present

## 2023-07-12 DIAGNOSIS — E782 Mixed hyperlipidemia: Secondary | ICD-10-CM | POA: Diagnosis not present

## 2023-07-12 DIAGNOSIS — E0590 Thyrotoxicosis, unspecified without thyrotoxic crisis or storm: Secondary | ICD-10-CM | POA: Diagnosis not present

## 2023-07-12 DIAGNOSIS — Z79899 Other long term (current) drug therapy: Secondary | ICD-10-CM | POA: Diagnosis not present

## 2023-07-12 NOTE — Addendum Note (Signed)
 Addended by: Conny Del L on: 07/12/2023 09:06 AM   Modules accepted: Orders

## 2023-07-12 NOTE — Telephone Encounter (Signed)
 Called patient and she states she will go pick up the stool kit today.

## 2023-07-13 DIAGNOSIS — K5100 Ulcerative (chronic) pancolitis without complications: Secondary | ICD-10-CM | POA: Diagnosis not present

## 2023-07-15 LAB — CALPROTECTIN, FECAL: Calprotectin, Fecal: 690 ug/g — ABNORMAL HIGH (ref 0–120)

## 2023-07-19 ENCOUNTER — Telehealth: Payer: Self-pay

## 2023-07-19 DIAGNOSIS — R7989 Other specified abnormal findings of blood chemistry: Secondary | ICD-10-CM | POA: Diagnosis not present

## 2023-07-19 DIAGNOSIS — Z79899 Other long term (current) drug therapy: Secondary | ICD-10-CM | POA: Diagnosis not present

## 2023-07-19 DIAGNOSIS — K5190 Ulcerative colitis, unspecified, without complications: Secondary | ICD-10-CM | POA: Diagnosis not present

## 2023-07-19 DIAGNOSIS — M5090 Cervical disc disorder, unspecified, unspecified cervical region: Secondary | ICD-10-CM | POA: Diagnosis not present

## 2023-07-19 DIAGNOSIS — E782 Mixed hyperlipidemia: Secondary | ICD-10-CM | POA: Diagnosis not present

## 2023-07-19 DIAGNOSIS — I10 Essential (primary) hypertension: Secondary | ICD-10-CM | POA: Diagnosis not present

## 2023-07-19 DIAGNOSIS — R739 Hyperglycemia, unspecified: Secondary | ICD-10-CM | POA: Diagnosis not present

## 2023-07-19 NOTE — Telephone Encounter (Signed)
 Patient verbalized understanding of results. She is okay with switching to Entyvio and she will come by today and sign the Entyvio connect form.

## 2023-07-19 NOTE — Telephone Encounter (Signed)
 Filled out new start for for [facility] and faxed it to them with Labs, path report, last colonoscopy report, last office visit, demographics, insurance and medications list. Also sent Terri Fester a email to let her know it was coming her way.  Patient came and sign the Entyvio connect form. Filled out form and faxed to Entyvio connect. Faxed with labs, path report, last colonoscopy report, last office visit, demographics, insurance and medications list

## 2023-07-19 NOTE — Telephone Encounter (Signed)
-----   Message from [facility] sent at 07/19/2023  2:10 PM EDT ----- Her fecal calprotectin levels came back elevated compared to before.  I am worried that she is failing Humira  and would have to switch to different medication.  Given her age, I recommend Entyvio infusion followed by subcutaneous injection if she is agreeable  RV

## 2023-07-21 DIAGNOSIS — R300 Dysuria: Secondary | ICD-10-CM | POA: Diagnosis not present

## 2023-07-21 NOTE — Telephone Encounter (Signed)
 Patient returned a call and left a voicemail for [place] regarding a call she received. I returned the patient's call to confirm we received her message and informed her that I had forwarded the message to the nurse. I then transferred the patient to the nurse line and advised her to leave a voicemail if there was no answer, and to allow 24 to 48 hours for a return call.

## 2023-07-21 NOTE — Telephone Encounter (Signed)
 Patient was calling because she did not know if she was supposed to give information to the Entyvio connect team. Informed her yes they would be calling her. She states she will call them back then

## 2023-07-26 ENCOUNTER — Telehealth: Payer: Self-pay

## 2023-07-26 NOTE — Telephone Encounter (Signed)
 Insurance has approved medication till 03/29/2024

## 2023-07-26 NOTE — Telephone Encounter (Signed)
 Received a email from Kend[place] with [facility] Please be aware that I have initiated the PA request for the MDO via (CMM) Cover My Meds.  The MDO will need to log into CMM to complete the PA request by reviewing the responses and choosing "Send to Plan".     Patient Name: April Larsen  DOB: March 18, 1946  MD Name: Ellis Guys, MD  MD Phone Number: [phone]  MD Fax Number:[phone]  CMM Key: Key: [id number]  Pending PA: # PA Case ID #: [id number]   Submitted PA through cover my meds. Waiting on response from insurance company

## 2023-07-29 NOTE — Telephone Encounter (Signed)
 Called patient and explained this to the patient. She is okay with paying the 100 dollars. She states she will call [facility] today. She states she will let us  know when she is supposed to start them so we can tell her when to stop the Humira 

## 2023-07-29 NOTE — Telephone Encounter (Addendum)
 Per Terri Fester with [facility] Lunda Salines- she has to pay $100 up front and then can pay other $300 over time - we added to her financial hardship for the nursing so may even be cheaper once we file so $100 is all she owes if you want to call her and reassure her- its actually really cheap compared to most medicare patietns but def is the cheapest way to infuse based on her insurance     Quoted patient: PBMD copay $100 (due up front) $300 copay for n/s for weeks 0 & 2 combined  There is some cost concern as pt is on SS. I obtained permission to check FH eligibility through [employer] and pt was approved. Pt would like to follow up with MDO before deciding to move forward with Entyvio infusions. Patient will call me back Thursday.

## 2023-07-30 NOTE — Telephone Encounter (Signed)
 The patient called to update April Larsen regarding her conversation with the Entyvio support team. She stated that she spoke with them yesterday and was informed that it will take a few days for the next steps. They also mentioned something about the infusion, but she is unsure about the remaining details. She said she will keep us  posted as she receives more information.  I informed her that April Larsen is out of the office today, but I sent a message to [name] with the update. I advised the patient that April Larsen will call her on Monday to review everything and discuss next steps.

## 2023-08-02 NOTE — Telephone Encounter (Signed)
 Per Terri Fester with [facility] Ms. Mcclenahan is scheduled for her infusion for 5/9. The financial hardship program through us  not the manufacturer for both Entyvio and remicade- have no idea how!  But her copay was lowered to $0 so we are scheduling her!!

## 2023-08-02 NOTE — Telephone Encounter (Signed)
 Patient took the Humira  shot on Tuesday 08/26/2023 she is supposed to have her first infusion on 08/06/2023 of the Entyvio and wants to know if she needs to take or Humira  on this Tuesday or what you recommend

## 2023-08-02 NOTE — Telephone Encounter (Signed)
 Yes 07/27/2023. And patient verbalized understanding of results

## 2023-08-03 DIAGNOSIS — Z96651 Presence of right artificial knee joint: Secondary | ICD-10-CM | POA: Diagnosis not present

## 2023-08-04 DIAGNOSIS — K5100 Ulcerative (chronic) pancolitis without complications: Secondary | ICD-10-CM | POA: Diagnosis not present

## 2023-08-04 DIAGNOSIS — L570 Actinic keratosis: Secondary | ICD-10-CM | POA: Diagnosis not present

## 2023-08-04 DIAGNOSIS — D0471 Carcinoma in situ of skin of right lower limb, including hip: Secondary | ICD-10-CM | POA: Diagnosis not present

## 2023-08-06 DIAGNOSIS — K5100 Ulcerative (chronic) pancolitis without complications: Secondary | ICD-10-CM | POA: Diagnosis not present

## 2023-08-18 DIAGNOSIS — E0590 Thyrotoxicosis, unspecified without thyrotoxic crisis or storm: Secondary | ICD-10-CM | POA: Diagnosis not present

## 2023-08-20 DIAGNOSIS — K5100 Ulcerative (chronic) pancolitis without complications: Secondary | ICD-10-CM | POA: Diagnosis not present

## 2023-08-31 ENCOUNTER — Encounter: Payer: Self-pay | Admitting: Gastroenterology

## 2023-09-01 DIAGNOSIS — M4144 Neuromuscular scoliosis, thoracic region: Secondary | ICD-10-CM | POA: Diagnosis not present

## 2023-09-01 DIAGNOSIS — N3090 Cystitis, unspecified without hematuria: Secondary | ICD-10-CM | POA: Diagnosis not present

## 2023-09-01 DIAGNOSIS — M4722 Other spondylosis with radiculopathy, cervical region: Secondary | ICD-10-CM | POA: Diagnosis not present

## 2023-09-01 DIAGNOSIS — M4712 Other spondylosis with myelopathy, cervical region: Secondary | ICD-10-CM | POA: Diagnosis not present

## 2023-09-09 ENCOUNTER — Ambulatory Visit: Admitting: Obstetrics and Gynecology

## 2023-09-20 ENCOUNTER — Encounter: Payer: Self-pay | Admitting: Obstetrics and Gynecology

## 2023-09-20 ENCOUNTER — Ambulatory Visit: Admitting: Obstetrics and Gynecology

## 2023-09-20 VITALS — BP 105/69 | HR 90 | Ht 63.3 in | Wt 166.8 lb

## 2023-09-20 DIAGNOSIS — N368 Other specified disorders of urethra: Secondary | ICD-10-CM | POA: Diagnosis not present

## 2023-09-20 DIAGNOSIS — N952 Postmenopausal atrophic vaginitis: Secondary | ICD-10-CM | POA: Insufficient documentation

## 2023-09-20 DIAGNOSIS — R159 Full incontinence of feces: Secondary | ICD-10-CM | POA: Insufficient documentation

## 2023-09-20 DIAGNOSIS — N390 Urinary tract infection, site not specified: Secondary | ICD-10-CM | POA: Diagnosis not present

## 2023-09-20 DIAGNOSIS — N3941 Urge incontinence: Secondary | ICD-10-CM | POA: Diagnosis not present

## 2023-09-20 DIAGNOSIS — R310 Gross hematuria: Secondary | ICD-10-CM | POA: Insufficient documentation

## 2023-09-20 DIAGNOSIS — R350 Frequency of micturition: Secondary | ICD-10-CM | POA: Diagnosis not present

## 2023-09-20 LAB — POCT URINALYSIS DIP (CLINITEK)
Bilirubin, UA: NEGATIVE
Glucose, UA: NEGATIVE mg/dL
Ketones, POC UA: NEGATIVE mg/dL
Leukocytes, UA: NEGATIVE
Nitrite, UA: NEGATIVE
POC PROTEIN,UA: NEGATIVE
Spec Grav, UA: 1.025 (ref 1.010–1.025)
Urobilinogen, UA: 0.2 U/dL
pH, UA: 6 (ref 5.0–8.0)

## 2023-09-20 MED ORDER — CEPHALEXIN 250 MG PO TABS: 250.0000 mg | ORAL_TABLET | Freq: Every day | ORAL | 5 refills | Status: DC

## 2023-09-20 MED ORDER — ESTRADIOL 0.1 MG/GM VA CREA: TOPICAL_CREAM | VAGINAL | 11 refills | Status: AC

## 2023-09-20 NOTE — Assessment & Plan Note (Signed)
-   overall this is not occurring frequently but she is interested in improving pelvic floor strength. Referral was placed to PT

## 2023-09-20 NOTE — Assessment & Plan Note (Signed)
-   For management of gross hematuria, we discussed the importance of work-up including assessing the upper and lower GU tract with CT urogram and cystoscopy.  She will pursue this work-up and follow-up afterward to discuss the results and decide on a treatment plan based on the findings.  - Suspect gross hematuria is due to urethral prolapse

## 2023-09-20 NOTE — Assessment & Plan Note (Signed)
 For treatment of recurrent urinary tract infections, we discussed management of recurrent UTIs including prophylaxis with a daily low dose antibiotic, transvaginal estrogen therapy, D-mannose, and cranberry supplements.   - Start estrace cream 0.5g nightly for two weeks then twice a week after.  - Will also start daily cephalexin  250mg  x 6 months for prophylaxis
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
## 2023-09-20 NOTE — Patient Instructions (Addendum)
 Start vaginal estrogen therapy nightly for two weeks then 2 times weekly at night for treatment of vaginal atrophy (dryness of the vaginal tissues), and to help prevent urinary tract infections.  Please let us  know if the prescription is too expensive and we can look for alternative options.   Take antibiotic (cephalexin )- 1 tablet daily for 6 months to prevent UTIs.   We have ordered a CT to evaluate the kidneys due to blood in the urine.   You will also need a cystoscopy in the office to look inside the bladder.

## 2023-09-20 NOTE — Assessment & Plan Note (Signed)
-   Likely the cause of blood with wiping. Recommended vaginal estrogen cream. Will also plan for cystoscopy to assess urethra/ bladder

## 2023-09-20 NOTE — Assessment & Plan Note (Signed)
-   Due to UC- she is starting Entyvio soon and hopes this will help control some of her symptoms. We discussed that pelvic PT may help improve the bowel leakage and can also consider a sacral nerve stimulator.

## 2023-09-20 NOTE — Assessment & Plan Note (Deleted)
-   Likely the cause of blood with wiping. Recommended vaginal estrogen cream. Will also plan for cystoscopy to assess urethra/ bladder

## 2023-09-21 DIAGNOSIS — K5100 Ulcerative (chronic) pancolitis without complications: Secondary | ICD-10-CM | POA: Diagnosis not present

## 2023-09-22 DIAGNOSIS — K5100 Ulcerative (chronic) pancolitis without complications: Secondary | ICD-10-CM | POA: Diagnosis not present

## 2023-09-27 DIAGNOSIS — M4722 Other spondylosis with radiculopathy, cervical region: Secondary | ICD-10-CM | POA: Diagnosis not present

## 2023-09-27 DIAGNOSIS — N390 Urinary tract infection, site not specified: Secondary | ICD-10-CM | POA: Diagnosis not present

## 2023-09-27 DIAGNOSIS — M5090 Cervical disc disorder, unspecified, unspecified cervical region: Secondary | ICD-10-CM | POA: Diagnosis not present

## 2023-09-27 DIAGNOSIS — M4144 Neuromuscular scoliosis, thoracic region: Secondary | ICD-10-CM | POA: Diagnosis not present

## 2023-09-27 DIAGNOSIS — M4712 Other spondylosis with myelopathy, cervical region: Secondary | ICD-10-CM | POA: Diagnosis not present

## 2023-09-29 ENCOUNTER — Ambulatory Visit
Admission: RE | Admit: 2023-09-29 | Discharge: 2023-09-29 | Disposition: A | Discharge status: Home / Self Care | Source: Ambulatory Visit | Attending: Obstetrics and Gynecology | Admitting: Obstetrics and Gynecology

## 2023-09-29 DIAGNOSIS — K429 Umbilical hernia without obstruction or gangrene: Secondary | ICD-10-CM | POA: Diagnosis not present

## 2023-09-29 DIAGNOSIS — K5730 Diverticulosis of large intestine without perforation or abscess without bleeding: Secondary | ICD-10-CM | POA: Diagnosis not present

## 2023-09-29 DIAGNOSIS — R310 Gross hematuria: Secondary | ICD-10-CM | POA: Insufficient documentation

## 2023-09-29 DIAGNOSIS — K4090 Unilateral inguinal hernia, without obstruction or gangrene, not specified as recurrent: Secondary | ICD-10-CM | POA: Diagnosis not present

## 2023-09-29 DIAGNOSIS — K449 Diaphragmatic hernia without obstruction or gangrene: Secondary | ICD-10-CM | POA: Diagnosis not present

## 2023-09-29 MED ORDER — IOHEXOL 300 MG/ML  SOLN
100.0000 mL | Freq: Once | INTRAMUSCULAR | Status: AC | PRN
  Administered 2023-09-29: 100 mL via INTRAVENOUS

## 2023-09-29 MED ORDER — SODIUM CHLORIDE 0.9 % IV SOLN: INTRAVENOUS | Status: DC

## 2023-10-02 DIAGNOSIS — R350 Frequency of micturition: Secondary | ICD-10-CM | POA: Diagnosis not present

## 2023-10-02 DIAGNOSIS — K5190 Ulcerative colitis, unspecified, without complications: Secondary | ICD-10-CM | POA: Diagnosis not present

## 2023-10-02 DIAGNOSIS — R300 Dysuria: Secondary | ICD-10-CM | POA: Diagnosis not present

## 2023-10-02 DIAGNOSIS — N390 Urinary tract infection, site not specified: Secondary | ICD-10-CM | POA: Diagnosis not present

## 2023-10-06 DIAGNOSIS — H0012 Chalazion right lower eyelid: Secondary | ICD-10-CM | POA: Diagnosis not present

## 2023-10-13 DIAGNOSIS — R350 Frequency of micturition: Secondary | ICD-10-CM | POA: Diagnosis not present

## 2023-10-13 DIAGNOSIS — N390 Urinary tract infection, site not specified: Secondary | ICD-10-CM | POA: Diagnosis not present

## 2023-10-14 DIAGNOSIS — M65331 Trigger finger, right middle finger: Secondary | ICD-10-CM | POA: Diagnosis not present

## 2023-10-14 DIAGNOSIS — M152 Bouchard's nodes (with arthropathy): Secondary | ICD-10-CM | POA: Insufficient documentation

## 2023-10-15 DIAGNOSIS — M4712 Other spondylosis with myelopathy, cervical region: Secondary | ICD-10-CM | POA: Diagnosis not present

## 2023-10-15 DIAGNOSIS — Z6827 Body mass index (BMI) 27.0-27.9, adult: Secondary | ICD-10-CM | POA: Diagnosis not present

## 2023-10-29 NOTE — Progress Notes (Signed)
 [facility] Quality Team Note  Name: April Larsen Date of Birth: Aug 18, 1945 MRN: [id number] Date: 10/29/2023  [facility] Quality Team has reviewed this patient's chart, please see recommendations below:  [facility] Quality Other; (CHART REVIEWED FOR TRC. ABSTRACTED NIA AND ROD FOR 05/14/2023 D/C. SENT TO [facility] CLINIC FOR MRP/PE)

## 2023-11-04 DIAGNOSIS — E0590 Thyrotoxicosis, unspecified without thyrotoxic crisis or storm: Secondary | ICD-10-CM | POA: Diagnosis not present

## 2023-11-08 DIAGNOSIS — R300 Dysuria: Secondary | ICD-10-CM | POA: Diagnosis not present

## 2023-11-18 ENCOUNTER — Ambulatory Visit: Admitting: Obstetrics and Gynecology

## 2023-11-18 VITALS — BP 112/69 | HR 90

## 2023-11-18 DIAGNOSIS — R310 Gross hematuria: Secondary | ICD-10-CM

## 2023-11-18 DIAGNOSIS — R350 Frequency of micturition: Secondary | ICD-10-CM

## 2023-11-18 LAB — POCT URINALYSIS DIP (CLINITEK)
Bilirubin, UA: NEGATIVE
Blood, UA: NEGATIVE
Glucose, UA: NEGATIVE mg/dL
Ketones, POC UA: NEGATIVE mg/dL
Nitrite, UA: NEGATIVE
POC PROTEIN,UA: NEGATIVE
Spec Grav, UA: 1.015 (ref 1.010–1.025)
Urobilinogen, UA: 0.2 U/dL
pH, UA: 5.5 (ref 5.0–8.0)

## 2023-11-18 NOTE — Patient Instructions (Signed)
 Taking Care of Yourself after Urodynamics, Cystoscopy, Bulkamid Injection, or Botox Injection   Drink plenty of water for a day or two following your procedure. Try to have about 8 ounces (one cup) at a time, and do this 6 times or more per day unless you have fluid restrictitons AVOID irritative beverages such as coffee, tea, soda, alcoholic or citrus drinks for a day or two, as this may cause burning with urination.  For the first 1-2 days after the procedure, your urine may be pink or red in color. You may have some blood in your urine as a normal side effect of the procedure. Large amounts of bleeding or difficulty urinating are NOT normal. Call the nurse line if this happens or go to the nearest Emergency Room if the bleeding is heavy or you cannot urinate at all and it is after hours.  You may experience some discomfort or a burning sensation with urination after having this procedure. You can use over the counter Azo or pyridium to help with burning and follow the instructions on the packaging. If it does not improve within 1-2 days, or other symptoms appear (fever, chills, or difficulty urinating) call the office to speak to a nurse.  You may return to normal daily activities such as work, school, driving, exercising and housework on the day of the procedure. If your doctor gave you a prescription, take it as ordered.

## 2023-11-18 NOTE — Progress Notes (Signed)
 CYSTOSCOPY  CC:  This is a 78 y.o. with gross hematuria and recurrent UTI who presents today for cystoscopy.  She reports that she had a UTI in the beginning of July, right after she started taking prophylaxis. She reports that the culture was positive for E.Coli. She sometimes forgets to use the estradiol  cream.   She had CT Urogram 09/29/23:  IMPRESSION: 1. No CT findings to explain hematuria. No evidence of urinary tract calculus, mass, or hydronephrosis. No urinary tract filling defect on delayed phase imaging. 2. Status post rectosigmoid colon resection. Diverticulosis of the remnant sigmoid. 3. Status post cholecystectomy. 4. Small fat containing epigastric and umbilical hernias. Small fat containing right inguinal hernia.  Results for orders placed or performed in visit on 09/20/23  POCT URINALYSIS DIP (CLINITEK)   Collection Time: 09/20/23  1:18 PM  Result Value Ref Range   Color, UA yellow yellow   Clarity, UA clear clear   Glucose, UA negative negative mg/dL   Bilirubin, UA negative negative   Ketones, POC UA negative negative mg/dL   Spec Grav, UA 8.974 8.989 - 1.025   Blood, UA trace-intact (A) negative   pH, UA 6.0 5.0 - 8.0   POC PROTEIN,UA negative negative, trace   Urobilinogen, UA 0.2 0.2 or 1.0 E.U./dL   Nitrite, UA Negative Negative   Leukocytes, UA Negative Negative     BP 112/69   Pulse 90   CYSTOSCOPY: A time out was performed.  The periurethral area was prepped and draped in a sterile manner.  2% lidocaine  jetpack was inserted at the urethral meatus. The urethra and bladder were visualized with flexible cystoscope.  She had normal urethral coaptation and normal urethral mucosa.  She had normal bladder mucosa. She had bilateral clear efflux from both ureteral orifices.  She had no squamous metaplasia at the trigone, no trabeculations, cellules or diverticuli.     ASSESSMENT:  78 y.o. with gross hematuria. Cystoscopy today is normal.  PLAN:  -  Continue vaginal estrace  cream twice a week and daily prophylaxis with cephalexin  for UTI prevention.  - All questions answered and post-procedures instructions were given  - Follow up 4 months or sooner if needed  April LOISE Caper, MD

## 2023-11-24 DIAGNOSIS — H0012 Chalazion right lower eyelid: Secondary | ICD-10-CM | POA: Diagnosis not present

## 2023-11-24 DIAGNOSIS — H26492 Other secondary cataract, left eye: Secondary | ICD-10-CM | POA: Diagnosis not present

## 2023-11-24 DIAGNOSIS — H43813 Vitreous degeneration, bilateral: Secondary | ICD-10-CM | POA: Diagnosis not present

## 2023-11-24 DIAGNOSIS — M3501 Sicca syndrome with keratoconjunctivitis: Secondary | ICD-10-CM | POA: Diagnosis not present

## 2023-12-07 ENCOUNTER — Other Ambulatory Visit: Payer: Self-pay | Admitting: Internal Medicine

## 2023-12-07 DIAGNOSIS — Z1231 Encounter for screening mammogram for malignant neoplasm of breast: Secondary | ICD-10-CM

## 2023-12-10 DIAGNOSIS — H26492 Other secondary cataract, left eye: Secondary | ICD-10-CM | POA: Diagnosis not present

## 2023-12-27 DIAGNOSIS — K5100 Ulcerative (chronic) pancolitis without complications: Secondary | ICD-10-CM | POA: Diagnosis not present

## 2023-12-28 ENCOUNTER — Ambulatory Visit
Admission: RE | Admit: 2023-12-28 | Discharge: 2023-12-28 | Disposition: A | Discharge status: Home / Self Care | Source: Ambulatory Visit | Attending: Internal Medicine | Admitting: Internal Medicine

## 2023-12-28 DIAGNOSIS — M19071 Primary osteoarthritis, right ankle and foot: Secondary | ICD-10-CM | POA: Diagnosis not present

## 2023-12-28 DIAGNOSIS — M79671 Pain in right foot: Secondary | ICD-10-CM | POA: Diagnosis not present

## 2023-12-28 DIAGNOSIS — Z1231 Encounter for screening mammogram for malignant neoplasm of breast: Secondary | ICD-10-CM | POA: Insufficient documentation

## 2023-12-28 DIAGNOSIS — K5190 Ulcerative colitis, unspecified, without complications: Secondary | ICD-10-CM | POA: Diagnosis not present

## 2023-12-28 DIAGNOSIS — K5100 Ulcerative (chronic) pancolitis without complications: Secondary | ICD-10-CM | POA: Diagnosis not present

## 2023-12-28 DIAGNOSIS — K219 Gastro-esophageal reflux disease without esophagitis: Secondary | ICD-10-CM | POA: Diagnosis not present

## 2023-12-30 DIAGNOSIS — H26491 Other secondary cataract, right eye: Secondary | ICD-10-CM | POA: Diagnosis not present

## 2024-01-03 ENCOUNTER — Ambulatory Visit: Admitting: Physical Therapy

## 2024-01-03 ENCOUNTER — Ambulatory Visit: Admitting: Pain Medicine

## 2024-01-10 ENCOUNTER — Ambulatory Visit: Admitting: Physical Therapy

## 2024-01-16 DIAGNOSIS — G894 Chronic pain syndrome: Secondary | ICD-10-CM | POA: Insufficient documentation

## 2024-01-16 DIAGNOSIS — M899 Disorder of bone, unspecified: Secondary | ICD-10-CM | POA: Insufficient documentation

## 2024-01-16 DIAGNOSIS — Z789 Other specified health status: Secondary | ICD-10-CM | POA: Insufficient documentation

## 2024-01-16 DIAGNOSIS — Z79899 Other long term (current) drug therapy: Secondary | ICD-10-CM | POA: Insufficient documentation

## 2024-01-16 NOTE — Patient Instructions (Signed)

## 2024-01-16 NOTE — Progress Notes (Unsigned)
 PROVIDER NOTE: Interpretation of information contained herein should be left to medically-trained personnel. Specific patient instructions are provided elsewhere under Patient Instructions section of medical record. This document was created in part using AI and STT-dictation technology, any transcriptional errors that may result from this process are unintentional.  Patient: April Larsen  Service: E/M Encounter  Provider: Eric DELENA Como, MD  DOB: 18-Feb-1946  Delivery: Face-to-face  Specialty: Interventional Pain Management  MRN: [id number]  Setting: Ambulatory outpatient facility  Specialty designation: 09  Type: New Patient  Location: Outpatient office facility  PCP: Cleotilde Oneil FALCON, MD  DOS: 01/17/2024    Referring Prov.: Jennetta Gerard JONETTA, NP   Primary Reason(s) for Visit: Encounter for initial evaluation of one or more chronic problems (new to examiner) potentially causing chronic pain, and posing a threat to normal musculoskeletal function. (Level of risk: High) CC: Shoulder Pain (Bilateral shoulders and upper back)  HPI  April Larsen is a 78 y.o. year old, female patient, who comes for the first time to our practice referred by Jennetta Gerard D, NP for our initial evaluation of her chronic pain. She has Ulcerative colitis without complications (HCC); Diverticulitis; Female stress incontinence; Gastroesophageal reflux disease without esophagitis; Incomplete emptying of bladder; Migraine; Postmenopausal; Idiopathic scoliosis and kyphoscoliosis; Scoliosis of lumbosacral spine; Trochanteric bursitis; Urethral prolapse; Urge incontinence; Diverticulitis of large intestine; Generalized osteoarthritis; History of 2019 novel coronavirus disease (COVID-19); Hyperlipidemia, mixed; Major depressive disorder, recurrent, mild; Medicare annual wellness visit, initial; Osteopenia of multiple sites; Primary osteoarthritis of right knee; Hiatal hernia; History of repair of paraesophageal hernia; Mild dementia  (HCC); Vertigo; Essential hypertension; Spinal surgery in prior 3 months for correction of scoliosis; Fall (on) (from) other stairs and steps 06/14/21, subsequent encounter; Post concussion syndrome; Fall; Hyperthyroidism; External hordeolum; Anxiety; Atherosclerosis of abdominal aorta; Cervical spondylosis; Chronic low back pain; Hyperreflexia; Lumbar foraminal stenosis; Neck pain; Scoliosis due to degenerative disease of spine in adult patient; Polyp of descending colon; Total knee replacement status; Iron deficiency anemia; Polyp of transverse colon; Cervical spondylosis with myelopathy and radiculopathy; Fracture of one rib, left side, subsequent encounter for fracture with routine healing; Other specified disorders of bone density and structure, multiple sites; Recurrent UTI; Gross hematuria; Vaginal atrophy; Incontinence of feces; Degenerative arthritis 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 Today she comes in for evaluation of her Shoulder Pain (Bilateral shoulders and upper back)  Pain Assessment: Location: Right, Left (upper back) Shoulder Radiating: radiates  down both arms to elbows and occasionally up to neck Onset: More than a month ago Duration: Chronic pain Quality: Aching, Constant, Burning Severity: 5 /10 (subjective, self-reported pain score)  Effect on ADL: limits ADLs Timing: Constant Modifying factors: meds, heat , laying down BP: 120/66  HR: 89  Onset and Duration: Present longer than 3 months Cause of  pain: Unknown Severity: NAS-11 at its worse: 9/10, NAS-11 at its best: 2/10, NAS-11 now: 7/10, and NAS-11 on the average: 7/10 Timing: During activity or exercise and After activity or exercise Aggravating Factors: Bending, Climbing, Kneeling, Lifiting, Prolonged sitting, Prolonged standing, Squatting, Stooping , and Walking Alleviating Factors: Cold packs, Hot packs, Lying down,  and Resting Associated Problems: Dizziness, Fatigue, Numbness, Weakness, and Pain that does not allow patient to sleep Quality of Pain: Aching, Burning, Deep, Sharp, Shooting, and Uncomfortable Previous Examinations or Tests: CT scan and MRI scan Previous Treatments: Stretching exercises  Ms. Hosley is being evaluated for possible interventional pain management therapies for the treatment of her chronic pain.  Discussed the use of AI scribe software for clinical note transcription with the patient, who gave verbal consent to proceed.  History of Present Illness   April Larsen is a 78 year old female with scoliosis and ulcerative colitis who presents with worsening back and neck pain. She was referred by a nurse practitioner for pain management.  April Larsen has a history of scoliosis and has undergone back and neck surgeries, with the latter performed in February 2025. Despite these interventions, she experiences worsening pain, particularly in the neck and shoulder blade area, with occasional radiation to the elbows. The pain is severe, described as feeling like 'fire underneath my skin' by nighttime.  She is prescribed OxyContin  but is hesitant to use it regularly due to dependency concerns. She currently takes gabapentin  300 mg at night, sometimes splitting the dose between afternoon and night. She experiences stiffness and soreness upon waking, which improves throughout the day.  Her medical history includes ulcerative colitis, managed with bi-weekly Entyvio injections and a stool-forming powder. She avoids  anti-inflammatories due to her gastrointestinal history. Her daughter is concerned about her mobility, fearing potential wheelchair dependence if her condition worsens. Mack notes increased pain since her neck surgery, despite an improved appearance of her back.             Ms. Petros has been informed that this initial visit was an evaluation only.  On the follow up appointment I will go over the results, including ordered tests and available interventional therapies. At that time she will have the opportunity to decide whether to proceed with offered therapies or not. In the event that Ms. Polimeni prefers avoiding interventional options, this will conclude our involvement in the case.  Medication management recommendations may be provided upon request.  Patient informed that diagnostic tests may be ordered to assist in identifying underlying causes, narrow the list of differential diagnoses and aid in determining candidacy for (or contraindications to) planned therapeutic interventions.  Historic Controlled Substance Pharmacotherapy Review PMP and historical list of controlled substances: Gabapentin  300 Mg Capsule ;Oxycodone -Acetaminophen  5-325 ;Hydrocodone -Acetamin 5-325 Mg ;Tramadol  Hcl 50 Mg Tablet ;Oxycodone  Hcl (Ir) 5 Mg Tablet ; Most recently prescribed controlled substance(s): Opioid Analgesic: None MME/day: 0 mg/day  Historical Monitoring: The patient  reports no history of drug use. List of prior UDS Testing: No results found for: MDMA, COCAINSCRNUR, PCPSCRNUR, PCPQUANT, CANNABQUANT, THCU, ETH, CBDTHCR, D8THCCBX, D9THCCBX Historical Background Evaluation: [place] PMP: PDMP reviewed during this encounter. Review of the past 42-months conducted.             PMP NARX Score Report:  Narcotic: 150 Sedative: 060 Stimulant: 000 [place] Department of public safety, offender search: Engineer, mining Information) Non-contributory Risk Assessment Profile: Aberrant behavior: None observed  or detected today Risk factors for fatal opioid overdose: None identified today PMP NARX Overdose Risk Score: 000 Fatal overdose hazard ratio (HR): Calculation deferred Non-fatal overdose hazard ratio (HR): Calculation deferred Risk of opioid abuse or dependence: 0.7-3.0% with doses <= 36 MME/day and 6.1-26% with doses >= 120 MME/day. Substance use disorder (SUD) risk level: See below Personal History of Substance Abuse (SUD-Substance use disorder):  Alcohol: Negative  Illegal Drugs: Negative  Rx Drugs: Negative  ORT Risk Level calculation: Low Risk  Opioid Risk Tool - 01/17/24 1303       Family History of Substance Abuse   Alcohol Negative    Illegal Drugs Negative    Rx Drugs Negative      Personal History of Substance Abuse   Alcohol Negative    Illegal Drugs Negative    Rx Drugs Negative      Age   Age between 18-45 years  No      Psychological Disease   Psychological Disease Negative    Depression Negative      Total Score   Opioid Risk Tool Scoring 0    Opioid Risk Interpretation Low Risk         ORT Scoring interpretation table:  Score <3 = Low Risk for SUD  Score between 4-7 = Moderate Risk for SUD  Score >8 = High Risk for Opioid Abuse   PHQ-2 Depression Scale:  Total score:    PHQ-2 Scoring interpretation table: (Score and probability of major depressive disorder)  Score 0 = No depression  Score 1 = 15.4% Probability  Score 2 = 21.1% Probability  Score 3 = 38.4% Probability  Score 4 = 45.5% Probability  Score 5 = 56.4% Probability  Score 6 = 78.6% Probability   PHQ-9 Depression Scale:  Total score:    PHQ-9 Scoring interpretation table:  Score 0-4 = No depression  Score 5-9 = Mild depression  Score 10-14 = Moderate depression  Score 15-19 = Moderately severe depression  Score 20-27 = Severe depression (2.4 times higher risk of SUD and 2.89 times higher risk of overuse)   Pharmacologic Plan: As per protocol, I have not taken over any controlled  substance management, pending the results of ordered tests and/or consults.            Initial impression: Pending review of available data and ordered tests.  Meds   Current Outpatient Medications:    adalimumab  (HUMIRA , 2 PEN,) 40 MG/0.8ML AJKT pen, , Disp: , Rfl:    Ascorbic Acid  (VITAMIN C ) 1000 MG tablet, Take 1,000 mg by mouth daily., Disp: , Rfl:    Cholecalciferol  (VITAMIN D ) 50 MCG (2000 UT) tablet, Take 2,000 Units by mouth daily., Disp: , Rfl:    cholestyramine  (QUESTRAN ) 4 g packet, MIX 1 PACKET IN LIQUID AND TAKE BY MOUTH TWICE DAILY, Disp: 60 each, Rfl: 4   docusate sodium  (COLACE) 100 MG capsule, Take 1 capsule (100 mg total) by mouth 2 (two) times daily., Disp: 10 capsule, Rfl: 0   DULoxetine  (CYMBALTA ) 60 MG capsule, Take 60 mg by mouth at bedtime. , Disp: , Rfl: 3   ENTYVIO 300 MG injection, Inject into the vein., Disp: , Rfl:    estradiol  (ESTRACE ) 0.1 MG/GM vaginal cream, Place 0.5g nightly for two weeks then twice a week after, Disp: 42.5 g, Rfl: 11   fluticasone (ALLERGY RELIEF) 50 MCG/ACT nasal spray, Place 1 spray into both nostrils daily as needed for allergies or rhinitis., Disp: , Rfl:    gabapentin  (NEURONTIN ) 300 MG capsule, Take 300 mg by mouth at bedtime., Disp: , Rfl:    Magnesium  250 MG TABS, Take 250 mg by mouth daily., Disp: , Rfl:    meclizine  (ANTIVERT ) 25 MG tablet, Take 1 tablet (25 mg total) by mouth 3 (three) times daily as needed for dizziness or nausea., Disp: 30 tablet, Rfl: 0   methimazole  (TAPAZOLE ) 5 MG tablet, Take 2.5  mg by mouth daily., Disp: , Rfl:    Misc Natural Products (OSTEO BI-FLEX ADV JOINT SHIELD PO), Take 1 tablet by mouth daily., Disp: , Rfl:    neomycin -polymyxin b-dexamethasone  (MAXITROL ) 3.5-10000-0.1 SUSP, INSTILL 1 DROP INTO AFFECTED EYE THREE TIMES DAILY, Disp: , Rfl:    oxyCODONE -acetaminophen  (PERCOCET/ROXICET) 5-325 MG tablet, Take 1-2 tablets by mouth every 4 (four) hours as needed for moderate pain (pain score 4-6)., Disp:  30 tablet, Rfl: 0   pantoprazole  (PROTONIX ) 40 MG tablet, Take 40 mg by mouth 2 (two) times daily., Disp: , Rfl:    triamterene -hydrochlorothiazide  (DYAZIDE ) 37.5-25 MG capsule, Take 1 capsule by mouth daily., Disp: , Rfl:    vitamin B-12 (CYANOCOBALAMIN ) 500 MCG tablet, Take 500 mcg by mouth daily., Disp: , Rfl:    Cephalexin  250 MG tablet, Take 1 tablet (250 mg total) by mouth daily. (Patient not taking: Reported on 01/17/2024), Disp: 30 tablet, Rfl: 5  Imaging Review  Cervical Imaging: Cervical MR wo contrast: Results for orders placed during the hospital encounter of 02/21/23 MR CERVICAL SPINE WO CONTRAST  Narrative CLINICAL DATA:  Chronic neck pain radiating to both arms  EXAM: MRI CERVICAL SPINE WITHOUT CONTRAST  TECHNIQUE: Multiplanar, multisequence MR imaging of the cervical spine was performed. No intravenous contrast was administered.  COMPARISON:  06/16/2021  FINDINGS: Alignment: Physiologic.  Vertebrae: No acute fracture, evidence of discitis, or aggressive bone lesion.  Cord: Normal signal and morphology.  Posterior Fossa, vertebral arteries, paraspinal tissues: Posterior fossa demonstrates no focal abnormality. Vertebral artery flow voids are maintained. Paraspinal soft tissues are unremarkable.  Disc levels:  Discs: Moderate disc height loss at C3-4, C4-5, C5-6 and C6-7.  C2-3: Mild the spillage. Mild bilateral facet arthropathy. Moderate bilateral foraminal stenosis. No central canal stenosis.  C3-4: Moderate broad disc osteophyte complex mildly impressing on the ventral cervical spinal cord. Moderate left facet arthropathy. Bilateral uncovertebral degenerative changes. Moderate right and severe left foraminal stenosis. Moderate central canal stenosis.  C4-5: Mild disc osteophyte complex. Severe left and mild right facet arthropathy. Bilateral uncovertebral degenerative changes. Severe bilateral foraminal stenosis. Moderate spinal stenosis.  C5-6:  Mild disc osteophyte complex. Bilateral uncovertebral degenerative changes, right worse than left. Mild bilateral facet arthropathy. Severe right and moderate-severe left foraminal stenosis. Moderate spinal stenosis.  C6-7: Moderate disc osteophyte complex. Bilateral uncovertebral degenerative changes. Moderate bilateral facet arthropathy. Severe bilateral foraminal stenosis, right worse than left. Mild spinal stenosis.  C7-T1: Mild disc bulge. Moderate bilateral facet arthropathy. No significant foraminal or central canal stenosis.  T2-3: Mild disc bulge.  No foraminal or central canal stenosis.  IMPRESSION: 1. Diffuse cervical spine spondylosis as described above. 2. No acute osseous injury of the cervical spine.   Electronically Signed By: Julaine Blanch M.D. On: 03/09/2023 16:01  Cervical MR w/wo contrast: Results for orders placed during the hospital encounter of 06/15/21 MR CERVICAL SPINE W WO CONTRAST  Narrative CLINICAL DATA:  Fall with trauma to the head and neck  EXAM: MRI CERVICAL SPINE WITHOUT AND WITH CONTRAST  TECHNIQUE: Multiplanar and multiecho pulse sequences of the cervical spine, to include the craniocervical junction and cervicothoracic junction, were obtained without and with intravenous contrast.  CONTRAST:  7mL GADAVIST  GADOBUTROL  1 MMOL/ML IV SOLN  COMPARISON:  Cervical CT 06/14/2021.  Cervical MRI 09/29/2020.  FINDINGS: Alignment: Straightening of the normal cervical lordosis. 1-2 mm anterolisthesis C7-T1.  Vertebrae: No fracture or focal bone lesion. Mild cystic change of the dens.  Cord: No cord compression or focal cord lesion. See  below regarding stenosis.  Posterior Fossa, vertebral arteries, paraspinal tissues: Negative  Disc levels:  The foramen magnum is widely patent. There is ordinary mild osteoarthritis of the C1-2 articulation but no encroachment upon the neural structures.  C2-3: Mild bulging of the disc. No compressive  canal stenosis. Mild facet degeneration. Mild bilateral foraminal narrowing.  C3-4: Spondylosis with endplate osteophytes and bulging of the disc. Narrowing of the ventral subarachnoid space but no compression of the cord. AP diameter of the canal in the midline 8.3 mm. Facet osteoarthritis more pronounced on the left. Mild bony foraminal stenosis on the left.  C4-5: Spondylosis with endplate osteophytes and bulging of the disc. Narrowing of the subarachnoid space but no compression of the cord. AP diameter of the canal in the midline 9 mm. Facet osteoarthritis on the left. Mild left foraminal stenosis.  C5-6: Spondylosis with endplate osteophytes and bulging of the disc. Narrowing of the subarachnoid space but no compression of the cord. AP diameter of the canal in the midline 9 mm. There is bilateral bony foraminal stenosis that could affect either C6 nerve.  C6-7: Spondylosis with endplate osteophytes and bulging of the disc. Narrowing of the ventral subarachnoid space but no compression of the cord. AP diameter of the canal in the midline 10.7 mm. Mild bony foraminal stenosis on the right.  C7-T1: Facet osteoarthritis with 1-2 mm of anterolisthesis. No compressive canal or foraminal narrowing.  IMPRESSION: No acute or traumatic finding in the cervical region.  Chronic degenerative spondylosis as outlined above. Mild narrowing of the canal from C3-4 through C6-7, but no compressive canal stenosis. Foraminal narrowing due to osteophytic encroachment with some potential for relevant foraminal stenosis on the left at C3-4, on the left at C4-5, bilateral at C5-6 and on the right at C6-7. No change is appreciated from the recent CT or MRI.   Electronically Signed By: Oneil Officer M.D. On: 06/16/2021 13:38  Cervical CT wo contrast: Results for orders placed during the hospital encounter of 06/27/21 CT Cervical Spine Wo Contrast  Narrative CLINICAL DATA:  Multiple  falls  EXAM: CT CERVICAL SPINE WITHOUT CONTRAST  TECHNIQUE: Multidetector CT imaging of the cervical spine was performed without intravenous contrast. Multiplanar CT image reconstructions were also generated.  RADIATION DOSE REDUCTION: This exam was performed according to the departmental dose-optimization program which includes automated exposure control, adjustment of the mA and/or kV according to patient size and/or use of iterative reconstruction technique.  COMPARISON:  06/14/2021  FINDINGS: Alignment: Alignment normal.  Skull base and vertebrae: Osseous demineralization. Multilevel disc space narrowing and endplate spur formation. Multilevel facet degenerative changes. Vertebral body heights maintained. Skull base intact. No fracture, subluxation, or bone destruction.  Soft tissues and spinal canal: Prevertebral soft tissues normal thickness. Soft tissues unremarkable.  Disc levels:  No significant abnormalities  Upper chest: Lung apices clear  Other: Orthopedic hardware upper thoracic spine.  IMPRESSION: Multilevel degenerative disc and facet disease changes of the cervical spine.  Osseous demineralization.  No acute cervical spine abnormalities.   Electronically Signed By: Oneil Kiss M.D. On: 06/27/2021 16:01  Cervical DG 2-3 views: Results for orders placed during the hospital encounter of 05/13/23 DG Cervical Spine 2 or 3 views  Narrative CLINICAL DATA:  Elective surgery.  EXAM: CERVICAL SPINE - 2-3 VIEW  COMPARISON:  None Available.  FINDINGS: Two cross-table lateral views of the cervical spine from the operating room submitted.  Film timed 2:10 p.m.: Surgical instrument localizes over the anterior aspect of the C4-C5  disc space.  Film timed 5:05 p.m.: Anterior fusion hardware from C3 through tentative C7 with interbody spacers. Lower aspect of the hardware is not wall defined due to overlapping soft  tissues.  IMPRESSION: Intraoperative spot views during cervical spine surgery.   Electronically Signed By: Andrea Gasman M.D. On: 05/13/2023 17:36  Thoracic Imaging: Thoracic DG 2-3 views: Results for orders placed during the hospital encounter of 06/15/21 DG Thoracic Spine 2 View  Narrative CLINICAL DATA:  Fall, back pain  EXAM: THORACIC SPINE 2 VIEWS  COMPARISON:  None.  FINDINGS: Thoracolumbar spine fixation hardware with mild lower thoracic dextroscoliosis. Normal thoracic kyphosis.  No evidence of fracture or dislocation. Vertebral body heights are maintained.  No evidence of hardware complication.  Mild degenerative changes of the mid/lower thoracic spine.  IMPRESSION: Thoracolumbar spine fixation hardware, without evidence of complication.  No fracture or dislocation is seen.   Electronically Signed By: Pinkie Pebbles M.D. On: 06/15/2021 01:27  Lumbosacral Imaging: Lumbar MR wo contrast: Results for orders placed during the hospital encounter of 09/29/20 MR LUMBAR SPINE WO CONTRAST  Narrative CLINICAL DATA:  Chronic low back pain weakness  EXAM: MRI LUMBAR SPINE WITHOUT CONTRAST  TECHNIQUE: Multiplanar, multisequence MR imaging of the lumbar spine was performed. No intravenous contrast was administered.  COMPARISON:  08/24/2014  FINDINGS: Segmentation: Normal  Alignment: Levoscoliosis with apex at L2. Left lateral subluxation of L3 on L4.  Vertebrae:  No fracture, evidence of discitis, or bone lesion.  Conus medullaris and cauda equina: Conus extends to the L1 level. Conus and cauda equina appear normal.  Paraspinal and other soft tissues: Negative.  Disc levels:  L1-L2: Sagittal images. Small disc bulge. No spinal canal stenosis. Unchanged severe right and left neural foraminal stenosis.  L2-L3: Sagittal images only. Small disc bulge. No spinal canal stenosis. Unchanged severe right and mild left neural  foraminal stenosis.  L3-L4: Sagittal images only. Small disc bulge with endplate spurring. No spinal canal stenosis. Unchanged severe right and mild left neural foraminal stenosis.  L4-L5: Unchanged small left asymmetric disc bulge Left lateral recess narrowing without central spinal canal stenosis. Unchanged severe left neural foraminal stenosis.  L5-S1: Left asymmetric disc bulge with moderate facet hypertrophy, unchanged. No spinal canal stenosis. Unchanged mild left neural foraminal stenosis.  Visualized sacrum: Normal.  IMPRESSION: 1. Unchanged examination with moderate-to-severe neural foraminal stenosis at multiple levels. 2. No central spinal canal stenosis. 3. Marked levoscoliosis with apex at L2.   Electronically Signed By: Franky Stanford M.D. On: 09/30/2020 20:24  Lumbar CT w contrast: Results for orders placed during the hospital encounter of 10/17/20 CT LUMBAR SPINE W CONTRAST  Narrative CLINICAL DATA:  Chronic back pain  EXAM: CT MYELOGRAPHY LUMBAR SPINE  TECHNIQUE: CT imaging of the lumbar spine was performed after intrathecal iodinated contrast administration, reference procedure note. Multiplanar CT image reconstructions were also generated.  COMPARISON:  Lumbar MRI 09/29/2020  FINDINGS: Segmentation:  5 lumbar type vertebrae  Alignment: Marked levoscoliosis with L3-4 leftward translation measuring 12 mm.  Vertebrae: Negative for fracture or aggressive bone lesion. Generalized osteopenia  Conus medullaris: Extends to the L1 level and appears normal. No intrathecal contrast collection or leakage seen.  Paraspinal and other soft tissues: No perispinal mass or inflammation is seen. Fatty intrinsic back muscle atrophy along the concavity of the scoliosis.  Disc levels:  T12- L1: Disc narrowing and bulging with facet spurring. Moderate right foraminal stenosis.  L1-L2: Disc narrowing eccentric to the right where there is a gas containing  fissure. Degenerative right-sided facet spurring. Moderate right foraminal impingement.  L2-L3: Disc narrowing eccentric to the right where there is gas containing fissure and asymmetric endplate spurring. Right-sided facet spurring. Moderate right foraminal impingement.  L3-L4: Disc collapse eccentric to the right with diffuse gas containing fissure and right predominant disc bulging. Right-sided facet spurring. Severe right foraminal impingement. Right subarticular recess stenosis impinging on the right L4 nerve root.  L4-L5: Disc narrowing and bulging eccentric to the left. Asymmetric left facet spurring. Severe left foraminal stenosis.  L5-S1:Disc narrowing and bulging eccentric to the left. Degenerative facet spurring on the left more than right. Moderate left foraminal impingement.  IMPRESSION: 1. Levoscoliosis with generalized disc and facet degeneration. 2. Severe foraminal impingement on the right at L3-4 and left at L4-5. 3. Moderate foraminal impingement on the right at T12-L1 to L2-3 and on the left at L5-S1. 4. Right subarticular recess stenosis at L3-4. Diffusely patent spinal canal.   Electronically Signed By: Cassondra Roulette M.D. On: 10/18/2020 04:14  Lumbar DG (Complete) 4+V: Results for orders placed during the hospital encounter of 06/15/21 DG Lumbar Spine Complete  Narrative CLINICAL DATA:  Fall, back pain  EXAM: LUMBAR SPINE - COMPLETE 4+ VIEW  COMPARISON:  None.  FINDINGS: Lumbosacral spine fixation hardware, without evidence of complication. Moderate lumbar levoscoliosis with degenerative changes.  Normal lumbar lordosis.  No evidence of fracture or dislocation. Vertebral body heights are maintained.  Visualized bony pelvis appears intact.  IMPRESSION: Lumbosacral spine fixation hardware, without evidence of complication.  No fracture or dislocation is seen.   Electronically Signed By: Pinkie Pebbles M.D. On: 06/15/2021  01:28  Lumbar DG Myelogram Lumbosacral: Results for orders placed during the hospital encounter of 10/17/20 DG MYELOGRAPHY LUMBAR INJ LUMBOSACRAL  Narrative CLINICAL DATA:  78 year old female with advanced levoconvex scoliosis and multilevel degenerative disc disease. She has chronic back pain.  FLUOROSCOPY TIME:  1 minutes 24 seconds 22.8 mGy  PROCEDURE: LUMBAR MYELOGRAM  After thorough discussion of risks and benefits of the procedure including bleeding, infection, injury to nerves, blood vessels, adjacent structures as well as headache and CSF leak, written and oral informed consent was obtained. Consent was obtained by Dr. Wilkie Lent. Time out form was completed.  Patient was positioned prone on the fluoroscopy table. Local anesthesia was provided with 1% lidocaine  without epinephrine  after prepped and draped in the usual sterile fashion. Puncture was performed at L2-L3 using a 3 1/2 inch 22-gauge spinal needle via paramedian approach. Using a single pass through the dura, the needle was placed within the thecal sac, with return of clear CSF. 15 mL of Isovue  M-200 was injected into the thecal sac, with normal opacification of the nerve roots and cauda equina consistent with free flow within the subarachnoid space.  I personally performed the lumbar puncture and administered the intrathecal contrast. I also personally supervised acquisition of the myelogram images.  COMPARISON:  MRI lumbar spine 09/29/2020  FINDINGS: Alignment: Marked levoconvex scoliosis.  No significant listhesis.  Vertebra: No acute fracture.  Vertebral body heights are maintained.  T12-L1: Moderate ventral epidural defect.  L1-L2: Moderate ventral epidural defect.  L2-L3: Large ventral epidural defect with significant effacement of the CSF space.  L3-L4: Moderate ventral epidural defect with effacement of the CSF space.  L4-L5: Small ventral epidural defect.  L5-S1:  Normal.  IMPRESSION: 1. Successful L2-L3 lumbar puncture for CT myelography. 2. Advanced levoconvex scoliosis of the lumbar spine with the apex at L2. There is also a significant rotary component.  3. Moderately large ventral epidural defects with effacement of the CSF space at L2-L3 and L3-L4. Please see concurrently obtained but separately dictated CT myelogram for further detail.   Electronically Signed By: Wilkie Lent M.D. On: 10/17/2020 11:03  Hip Imaging: Hip-R DG 2-3 views: Results for orders placed during the hospital encounter of 09/03/15 DG Hip Unilat  With Pelvis 2-3 Views Right  Narrative CLINICAL DATA:  Patient reports fall x1 day ago. Pain on posterior lower back radiating into right hip and leg. No previous injuries to hip.  EXAM: DG HIP (WITH OR WITHOUT PELVIS) 2-3V RIGHT  COMPARISON:  CT 08/24/2015 and previous  FINDINGS: Lumbar levoscoliosis, incompletely visualized. Bony pelvis intact. Bilateral hips unremarkable.  IMPRESSION: 1. Negative right hip. 2. Lumbar levoscoliosis as before, incompletely visualized.   Electronically Signed By: JONETTA Faes M.D. On: 09/03/2015 12:04  Knee Imaging: Knee-R MR wo contrast: Results for orders placed during the hospital encounter of 01/19/22 MR KNEE RIGHT WO CONTRAST  Narrative CLINICAL DATA:  Knee pain for 1-2 years. Weakness with stair climbing. No known injury or prior relevant surgery.  EXAM: MRI OF THE RIGHT KNEE WITHOUT CONTRAST  TECHNIQUE: Multiplanar, multisequence MR imaging of the knee was performed. No intravenous contrast was administered.  COMPARISON:  None Available.  FINDINGS: MENISCI  Medial meniscus: Oblique tear of the posterior horn and body, most obvious on the coronal images. There is resulting peripheral extrusion of the meniscus from the joint space with mild caudal displacement of a small flap fragment into the meniscotibial recess. The meniscal root appears intact. No  centrally displaced meniscal fragments are identified.  Lateral meniscus:  Intact with normal morphology.  LIGAMENTS  Cruciates:  Intact.  Collaterals: Intact. Small amount of fluid within the pes anserine bursa.  CARTILAGE  Patellofemoral: Diffuse patellofemoral chondral thinning with osteophytes and subchondral cyst formation.  Medial: Moderate medial compartment osteoarthritis with diffuse chondral thinning, osteophytes and subchondral edema in the femoral condyle and tibial plateau.  Lateral: Milder lateral compartment osteoarthritis with full-thickness chondral thinning over the central aspect of the lateral tibial plateau with underlying subchondral cyst formation. Prominent peripheral osteophytes.  MISCELLANEOUS  Joint: Moderate-sized joint effusion with diffuse synovitis. No discrete loose bodies identified.  Popliteal Fossa: The popliteus muscle and tendon are intact. Tiny Baker's cyst.  Extensor Mechanism:  Intact.  Bones: No acute osseous findings are evident. There is a septated T2 hyperintense lesion within the fibular head measuring approximately 1.9 x 1.4 x 1.3 cm. This appears nonaggressive, without endosteal thinning, pathologic fracture or adjacent soft tissue mass, and probably reflects an incidental intraosseous ganglion. There is a smaller cystic lesion posteriorly in the proximal tibia measuring 10 mm in diameter. No worrisome osseous lesions.  Other: Mild anterior subcutaneous edema without other focal soft tissue abnormality.  IMPRESSION: 1. Oblique tear of the posterior horn and body of the medial meniscus with resulting peripheral extrusion of the meniscus from the joint space and a small flap fragment displaced into the meniscotibial recess. 2. Tricompartmental osteoarthritis, most advanced in the medial compartment. 3. Moderate-sized joint effusion with diffuse synovitis. 4. Intact lateral meniscus, cruciate and collateral  ligaments.   Electronically Signed By: Elsie Perone M.D. On: 01/21/2022 11:05  Complexity Note: Imaging results reviewed.                         ROS  Cardiovascular: High blood pressure and Chest pain Pulmonary or Respiratory: No reported pulmonary signs or symptoms such  as wheezing and difficulty taking a deep full breath (Asthma), difficulty blowing air out (Emphysema), coughing up mucus (Bronchitis), persistent dry cough, or temporary stoppage of breathing during sleep Neurological: No reported neurological signs or symptoms such as seizures, abnormal skin sensations, urinary and/or fecal incontinence, being born with an abnormal open spine and/or a tethered spinal cord Psychological-Psychiatric: No reported psychological or psychiatric signs or symptoms such as difficulty sleeping, anxiety, depression, delusions or hallucinations (schizophrenial), mood swings (bipolar disorders) or suicidal ideations or attempts Gastrointestinal: Vomiting blood (Ulcers) Genitourinary: No reported renal or genitourinary signs or symptoms such as difficulty voiding or producing urine, peeing blood, non-functioning kidney, kidney stones, difficulty emptying the bladder, difficulty controlling the flow of urine, or chronic kidney disease Hematological: Brusing easily and Bleeding easily Endocrine: No reported endocrine signs or symptoms such as high or low blood sugar, rapid heart rate due to high thyroid  levels, obesity or weight gain due to slow thyroid  or thyroid  disease Rheumatologic: Joint aches and or swelling due to excess weight (Osteoarthritis) Musculoskeletal: Negative for myasthenia gravis, muscular dystrophy, multiple sclerosis or malignant hyperthermia Work History: Retired  Allergies  Ms. Kell is allergic to dilaudid  [hydromorphone ], nsaids, and sulfa antibiotics.  Laboratory Chemistry Profile   Renal Lab Results  Component Value Date   BUN 26 (H) 05/07/2023   CREATININE 0.92  05/07/2023   BCR 32 (H) 11/09/2022   GFRAA >60 01/14/2019   GFRNONAA >60 05/07/2023   SPECGRAV 1.015 11/18/2023   PHUR 5.5 11/18/2023   PROTEINUR NEGATIVE 05/01/2022     Electrolytes Lab Results  Component Value Date   NA 137 05/07/2023   K 4.1 05/07/2023   CL 104 05/07/2023   CALCIUM  10.0 05/07/2023   MG 1.9 06/29/2021   PHOS 3.6 06/29/2021     Hepatic Lab Results  Component Value Date   AST 20 06/07/2023   ALT 16 06/07/2023   ALBUMIN  4.6 06/07/2023   ALKPHOS 157 (H) 06/16/2023   LIPASE 27 06/15/2021     ID Lab Results  Component Value Date   SARSCOV2NAA NEGATIVE 06/27/2021   STAPHAUREUS NEGATIVE 05/07/2023   MRSAPCR NEGATIVE 05/07/2023     Bone Lab Results  Component Value Date   VD25OH 29.9 (L) 06/16/2023   VD125OH2TOT 25.4 06/29/2021     Endocrine Lab Results  Component Value Date   GLUCOSE 99 05/07/2023   GLUCOSEU NEGATIVE 05/01/2022   TSH <0.010 (L) 06/27/2021   FREET4 3.25 (H) 06/29/2021     Neuropathy Lab Results  Component Value Date   VITAMINB12 713 11/09/2022     CNS No results found for: COLORCSF, APPEARCSF, RBCCOUNTCSF, WBCCSF, POLYSCSF, LYMPHSCSF, EOSCSF, PROTEINCSF, GLUCCSF, JCVIRUS, CSFOLI, IGGCSF, LABACHR, ACETBL   Inflammation (CRP: Acute  ESR: Chronic) Lab Results  Component Value Date   CRP 0.7 05/01/2022   ESRSEDRATE 8 05/01/2022     Rheumatology No results found for: RF, ANA, LABURIC, URICUR, LYMEIGGIGMAB, LYMEABIGMQN, HLAB27   Coagulation Lab Results  Component Value Date   PLT 333 05/07/2023   DDIMER 0.58 (H) 01/13/2019     Cardiovascular Lab Results  Component Value Date   BNP 12.3 06/27/2021   TROPONINI <0.03 08/24/2015   HGB 14.2 05/07/2023   HCT 42.1 05/07/2023     Screening Lab Results  Component Value Date   SARSCOV2NAA NEGATIVE 06/27/2021   STAPHAUREUS NEGATIVE 05/07/2023   MRSAPCR NEGATIVE 05/07/2023     Cancer No results found for: CEA, CA125,  LABCA2   Allergens No results found for: ALMOND, APPLE, ASPARAGUS, AVOCADO,  BANANA, BARLEY, BASIL, BAYLEAF, GREENBEAN, LIMABEAN, WHITEBEAN, BEEFIGE, REDBEET, BLUEBERRY, BROCCOLI, CABBAGE, MELON, CARROT, CASEIN, CASHEWNUT, CAULIFLOWER, CELERY     Note: Lab results reviewed.  PFSH  Drug: Ms. Hernan  reports no history of drug use. Alcohol:  reports no history of alcohol use. Tobacco:  reports that she has never smoked. She has never used smokeless tobacco. Medical:  has a past medical history of Actinic keratosis, Anemia, Anxiety, Arthritis, Cancer (HCC) (2016), Cataract (lens) fragments in eye following cataract surgery, bilateral (2013), Chronic ulcerative colitis, without complications (HCC) (10/29/2015), Diverticulitis (2018), Esophagitis, reflux (10/04/2014), Family history of adverse reaction to anesthesia, Female stress incontinence (08/09/2013), Foreign body in alimentary tract (07/23/2022), Gastroesophageal reflux disease without esophagitis (08/06/2016), GERD (gastroesophageal reflux disease), Headache, History of hiatal hernia, Hypertension, Hyperthyroidism, Incomplete emptying of bladder (10/29/2013), Migraine (07/26/2013), Pneumonia due to COVID-19 virus (2020), PONV (postoperative nausea and vomiting), Postherpetic neuralgia, Postmenopausal (11/09/2016), PVC (premature ventricular contraction), Scoliosis, Scoliosis (and kyphoscoliosis), idiopathic (07/26/2013), Scoliosis of lumbosacral spine (01/18/2015), Squamous cell carcinoma of skin (03/13/2013), Squamous cell carcinoma of skin (03/10/2018), Squamous cell carcinoma of skin (02/21/2019), Squamous cell carcinoma of skin (06/01/2019), Trochanteric bursitis (11/29/2014), Ulcerative colitis (HCC), Urethral prolapse (08/09/2013), and Urge incontinence (08/09/2013). Family: family history includes Breast cancer in her cousin; Breast cancer (age of onset: 8) in her paternal aunt; Breast cancer (age  of onset: 46) in her sister.  Past Surgical History:  Procedure Laterality Date   ABDOMINAL HYSTERECTOMY     ANTERIOR CERVICAL DECOMPRESSION/DISCECTOMY FUSION 4 LEVELS N/A 05/13/2023   Procedure: ANTERIOR CERVICAL DECOMPRESSION/DISCECTOMY CORTEZ MESSICK PROSTHESIS,PLATE SCREWS CERVICAL THREE-FOUR,CERVICAL FOUR-FIVE,CERVICAL FIVE-SIX,CERVICAL SIX-SEVEN;  Surgeon: Mavis Purchase, MD;  Location: [facility] OR;  Service: Neurosurgery;  Laterality: N/A;   BACK SURGERY  03/12/2021   L3-L4 AND L3-S1 DECOMPRESSION   BIOPSY  03/01/2023   Procedure: BIOPSY;  Surgeon: Unk Corinn Skiff, MD;  Location: [facility] ENDOSCOPY;  Service: Gastroenterology;;   CARPAL TUNNEL RELEASE Bilateral    CATARACT EXTRACTION W/ INTRAOCULAR LENS  IMPLANT, BILATERAL     CHOLECYSTECTOMY N/A 08/21/2016   Procedure: LAPAROSCOPIC CHOLECYSTECTOMY WITH INTRAOPERATIVE CHOLANGIOGRAM;  Surgeon: Claudene Larinda Bolder, MD;  Location: [facility] ORS;  Service: General;  Laterality: N/A;   COLONOSCOPY  11/06/2005   COLONOSCOPY  2010   COLONOSCOPY  2013   COLONOSCOPY WITH PROPOFOL  N/A 07/29/2016   Procedure: COLONOSCOPY WITH PROPOFOL ;  Surgeon: Lamar ONEIDA Holmes, MD;  Location: [facility] ENDOSCOPY;  Service: Endoscopy;  Laterality: N/A;   COLONOSCOPY WITH PROPOFOL  N/A 08/22/2020   Procedure: COLONOSCOPY WITH PROPOFOL ;  Surgeon: Janalyn Keene NOVAK, MD;  Location: [facility] ENDOSCOPY;  Service: Endoscopy;  Laterality: N/A;   COLONOSCOPY WITH PROPOFOL  N/A 01/22/2022   Procedure: COLONOSCOPY WITH PROPOFOL ;  Surgeon: Unk Corinn Skiff, MD;  Location: [facility] SURGERY CNTR;  Service: Endoscopy;  Laterality: N/A;   COLONOSCOPY WITH PROPOFOL  N/A 07/23/2022   Procedure: COLONOSCOPY WITH PROPOFOL ;  Surgeon: Unk Corinn Skiff, MD;  Location: [facility] SURGERY CNTR;  Service: Endoscopy;  Laterality: N/A;   COLONOSCOPY WITH PROPOFOL  N/A 03/01/2023   Procedure: COLONOSCOPY WITH PROPOFOL ;  Surgeon: Unk Corinn Skiff, MD;  Location: [facility] ENDOSCOPY;  Service: Gastroenterology;   Laterality: N/A;   ESOPHAGOGASTRODUODENOSCOPY  11/06/2005   ESOPHAGOGASTRODUODENOSCOPY N/A 06/24/2022   Procedure: ESOPHAGOGASTRODUODENOSCOPY (EGD);  Surgeon: Unk Corinn Skiff, MD;  Location: [facility] ENDOSCOPY;  Service: Gastroenterology;  Laterality: N/A;   ESOPHAGOGASTRODUODENOSCOPY (EGD) WITH PROPOFOL  N/A 07/29/2016   Procedure: ESOPHAGOGASTRODUODENOSCOPY (EGD) WITH PROPOFOL ;  Surgeon: Lamar ONEIDA Holmes, MD;  Location: [facility] ENDOSCOPY;  Service: Endoscopy;  Laterality: N/A;   ESOPHAGOGASTRODUODENOSCOPY (EGD)  WITH PROPOFOL  N/A 02/07/2020   Procedure: ESOPHAGOGASTRODUODENOSCOPY (EGD) WITH PROPOFOL ;  Surgeon: Janalyn Keene NOVAK, MD;  Location: [facility] ENDOSCOPY;  Service: Endoscopy;  Laterality: N/A;   ESOPHAGOGASTRODUODENOSCOPY (EGD) WITH PROPOFOL  N/A 01/22/2022   Procedure: ESOPHAGOGASTRODUODENOSCOPY (EGD) WITH PROPOFOL ;  Surgeon: Unk Corinn Skiff, MD;  Location: [facility] SURGERY CNTR;  Service: Endoscopy;  Laterality: N/A;   EYE SURGERY     GIVENS CAPSULE STUDY N/A 05/07/2022   Procedure: GIVENS CAPSULE STUDY;  Surgeon: Unk Corinn Skiff, MD;  Location: [facility] ENDOSCOPY;  Service: Gastroenterology;  Laterality: N/A;   GIVENS CAPSULE STUDY N/A 06/24/2022   Procedure: GIVENS CAPSULE STUDY;  Surgeon: Unk Corinn Skiff, MD;  Location: [facility] ENDOSCOPY;  Service: Gastroenterology;  Laterality: N/A;   KNEE ARTHROPLASTY Right 05/13/2022   Procedure: COMPUTER ASSISTED TOTAL KNEE ARTHROPLASTY;  Surgeon: Mardee Lynwood SQUIBB, MD;  Location: [facility] ORS;  Service: Orthopedics;  Laterality: Right;   LAPAROSCOPIC LOW ANTERIOR RESECTION  11/24/2016   Procedure: LAPAROSCOPIC LOW ANTERIOR RESECTION;  Surgeon: Jordis Laneta FALCON, MD;  Location: [facility] ORS;  Service: General;;   LUMBAR LAMINECTOMY  2004   L3-4   PARTIAL COLECTOMY  2018   POLYPECTOMY  01/22/2022   Procedure: POLYPECTOMY;  Surgeon: Unk Corinn Skiff, MD;  Location: [facility] SURGERY CNTR;  Service: Endoscopy;;   POLYPECTOMY  03/01/2023   Procedure:  POLYPECTOMY;  Surgeon: Unk Corinn Skiff, MD;  Location: [facility] ENDOSCOPY;  Service: Gastroenterology;;   POSTERIOR LAMINECTOMY / DECOMPRESSION LUMBAR SPINE  2009   L3-S1   TONSILLECTOMY     XI ROBOTIC ASSISTED PARAESOPHAGEAL HERNIA REPAIR N/A 02/27/2020   Procedure: XI ROBOTIC ASSISTED PARAESOPHAGEAL HERNIA REPAIR;  Surgeon: Jordis Laneta FALCON, MD;  Location: [facility] ORS;  Service: General;  Laterality: N/A;   Active Ambulatory Problems    Diagnosis Date Noted   Ulcerative colitis without complications (HCC) 10/29/2015   Diverticulitis 08/06/2016   Female stress incontinence 08/09/2013   Gastroesophageal reflux disease without esophagitis 08/06/2016   Incomplete emptying of bladder 10/29/2013   Migraine 07/26/2013   Postmenopausal 11/09/2016   Idiopathic scoliosis and kyphoscoliosis 07/26/2013   Scoliosis of lumbosacral spine 01/18/2015   Trochanteric bursitis 11/29/2014   Urethral prolapse 08/09/2013   Urge incontinence 08/09/2013   Diverticulitis of large intestine 05/18/2017   Generalized osteoarthritis 04/13/2019   History of 2019 novel coronavirus disease (COVID-19) 01/19/2019   Hyperlipidemia, mixed 11/19/2017   Major depressive disorder, recurrent, mild 12/08/2018   Medicare annual wellness visit, initial 11/11/2016   Osteopenia of multiple sites 04/13/2019   Primary osteoarthritis of right knee 01/05/2020   Hiatal hernia    History of repair of paraesophageal hernia 02/27/2020   Mild dementia (HCC) 06/12/2020   Vertigo 06/16/2021   Essential hypertension 10/08/2020   Spinal surgery in prior 3 months for correction of scoliosis 06/16/2021   Fall (on) (from) other stairs and steps 06/14/21, subsequent encounter 06/16/2021   Post concussion syndrome    Fall 06/27/2021   Hyperthyroidism 07/01/2021   External hordeolum 07/01/2021   Anxiety 01/07/2022   Atherosclerosis of abdominal aorta 10/08/2020   Cervical spondylosis 01/07/2022   Chronic low back pain 01/07/2022    Hyperreflexia 01/07/2022   Lumbar foraminal stenosis 01/07/2022   Neck pain 01/07/2022   Scoliosis due to degenerative disease of spine in adult patient 03/12/2021   Polyp of descending colon    Total knee replacement status 05/13/2022   Iron deficiency anemia 07/19/2022   Polyp of transverse colon 03/01/2023   Cervical spondylosis with myelopathy and radiculopathy 05/13/2023   Fracture of  one rib, left side, subsequent encounter for fracture with routine healing 07/02/2021   Other specified disorders of bone density and structure, multiple sites 07/02/2021   Recurrent UTI 09/20/2023   Gross hematuria 09/20/2023   Vaginal atrophy 09/20/2023   Incontinence of feces 09/20/2023   Degenerative arthritis of proximal interphalangeal joint of middle finger of right hand 10/14/2023   Vitamin D  deficiency 07/02/2021   Cervical disc disease 07/19/2023   Thyrotoxicosis 07/02/2021   Idiopathic scoliosis of lumbar spine 01/18/2015   Nutritional anemia 07/02/2021   Disorder of bone 07/02/2021   Dizziness and giddiness 07/02/2021   Chronic pain syndrome 01/16/2024   Pharmacologic therapy 01/16/2024   Disorder of skeletal system 01/16/2024   Problems influencing health status 01/16/2024   Chronic thoracic back pain (Bilateral) 01/17/2024   Chronic neck and back pain (Bilateral) 01/17/2024   Fusion of spine of cervicothoracic region 01/17/2024   Personal history of spine surgery 01/17/2024   Resolved Ambulatory Problems    Diagnosis Date Noted   Esophagitis, reflux 10/04/2014   Ulcerative colitis (HCC) 07/26/2013   Pneumonia due to COVID-19 virus 01/09/2019   Acute respiratory failure with hypoxia (HCC) 01/09/2019   Stomach irritation    Concussion 06/16/2021   Nausea and vomiting    Hypokalemia    Chest pain 06/28/2021   Chronic diarrhea    Gastric erythema    Foreign body in alimentary tract 07/23/2022   Past Medical History:  Diagnosis Date   Actinic keratosis    Anemia     Arthritis    Cancer (HCC) 2016   Cataract (lens) fragments in eye following cataract surgery, bilateral 2013   Chronic ulcerative colitis, without complications (HCC) 10/29/2015   Family history of adverse reaction to anesthesia    GERD (gastroesophageal reflux disease)    Headache    History of hiatal hernia    Hypertension    PONV (postoperative nausea and vomiting)    Postherpetic neuralgia    PVC (premature ventricular contraction)    Scoliosis    Scoliosis (and kyphoscoliosis), idiopathic 07/26/2013   Squamous cell carcinoma of skin 03/13/2013   Squamous cell carcinoma of skin 03/10/2018   Squamous cell carcinoma of skin 02/21/2019   Squamous cell carcinoma of skin 06/01/2019   Constitutional Exam  General appearance: Well nourished, well developed, and well hydrated. In no apparent acute distress Vitals:   01/17/24 1256  BP: 120/66  Pulse: 89  Temp: (!) 97.4 F (36.3 C)  SpO2: 99%  Weight: 155 lb (70.3 kg)  Height: 5' 5 (1.651 m)   BMI Assessment: Estimated body mass index is 25.79 kg/m as calculated from the following:   Height as of this encounter: 5' 5 (1.651 m).   Weight as of this encounter: 155 lb (70.3 kg).  BMI interpretation table: BMI level Category Range association with higher incidence of chronic pain  <18 kg/m2 Underweight   18.5-24.9 kg/m2 Ideal body weight   25-29.9 kg/m2 Overweight Increased incidence by 20%  30-34.9 kg/m2 Obese (Class I) Increased incidence by 68%  35-39.9 kg/m2 Severe obesity (Class II) Increased incidence by 136%  >40 kg/m2 Extreme obesity (Class III) Increased incidence by 254%   Patient's current BMI Ideal Body weight  Body mass index is 25.79 kg/m. Ideal body weight: 57 kg (125 lb 10.6 oz) Adjusted ideal body weight: 62.3 kg (137 lb 6.4 oz)   BMI Readings from Last 4 Encounters:  01/17/24 25.79 kg/m  09/20/23 29.27 kg/m  06/07/23 27.85 kg/m  05/13/23 26.95 kg/m  Wt Readings from Last 4 Encounters:  01/17/24  155 lb (70.3 kg)  09/20/23 166 lb 12.8 oz (75.7 kg)  06/07/23 167 lb 6 oz (75.9 kg)  05/13/23 167 lb (75.8 kg)    Psych/Mental status: Alert, oriented x 3 (person, place, & time)       Eyes: PERLA Respiratory: No evidence of acute respiratory distress  Assessment  Primary Diagnosis & Pertinent Problem List: The primary encounter diagnosis was Chronic pain syndrome. Diagnoses of Pharmacologic therapy, Disorder of skeletal system, Problems influencing health status, Chronic thoracic back pain (Bilateral), Chronic neck and back pain (Bilateral), Fusion of spine of cervicothoracic region, and Personal history of spine surgery were also pertinent to this visit.  Visit Diagnosis (New problems to examiner): 1. Chronic pain syndrome   2. Pharmacologic therapy   3. Disorder of skeletal system   4. Problems influencing health status   5. Chronic thoracic back pain (Bilateral)   6. Chronic neck and back pain (Bilateral)   7. Fusion of spine of cervicothoracic region   8. Personal history of spine surgery    Plan of Care (Initial workup plan)  Note: Ms. Pollok was reminded that as per protocol, today's visit has been an evaluation only. We have not taken over the patient's controlled substance management.  Problem-specific plan: Assessment and Plan    Chronic pain syndrome secondary to post-surgical scoliosis and cervical/thoracic spine arthritis   Chronic pain in the cervical and thoracic spine is severe, with burning sensations and morning stiffness, exacerbated by post-surgical changes and arthritis. Current management includes gabapentin  300 mg at night, which can be increased to 600 mg if tolerated. Pain is not fully controlled, and there is concern about dependency on stronger medications like OxyContin . Discussed the limitations of pain management in completely eliminating pain and the potential role of muscle pain. Order blood work and x-rays to assess the current condition. Consider  adding a low-dose anti-inflammatory such as Mobic or Celebrex, pending approval from her ulcerative colitis specialist. Consider magnesium  supplementation at bedtime if kidney function is normal. Consider a low dose of amitriptyline or Elavil at bedtime for additional pain control.  Ulcerative colitis   Ulcerative colitis is managed with Entyvio injections every other week and a powder supplement to aid in stool formation. There is concern about the interaction of anti-inflammatory medications with her condition. Consult with the ulcerative colitis specialist before starting any anti-inflammatory medications like Mobic or Celebrex.       Lab Orders         Compliance Drug Analysis, Ur         Comp. Metabolic Panel (12)         Magnesium          Vitamin B12         Sedimentation rate         25-Hydroxy vitamin D  Lcms D2+D3         C-reactive protein     Imaging Orders         DG Cervical Spine With Flex & Extend         DG Thoracic Spine W/Swimmers     Referral Orders  No referral(s) requested today   Procedure Orders    No procedure(s) ordered today   Pharmacotherapy (current): Medications ordered:  No orders of the defined types were placed in this encounter.  Medications administered during this visit: Tyiesha Brackney. Heumann had no medications administered during this visit.   Analgesic Pharmacotherapy:  Opioid Analgesics: For patients  currently taking or requesting to take opioid analgesics, in accordance with [place]  Medical Board Guidelines, we will assess their risks and indications for the use of these substances. After completing our evaluation, we may offer recommendations, but we no longer take patients for medication management. The prescribing physician will ultimately decide, based on his/her training and level of comfort whether to adopt any of the recommendations, including whether or not to prescribe such medicines.  Membrane stabilizer: To be determined at a later  time  Muscle relaxant: To be determined at a later time  NSAID: To be determined at a later time  Other analgesic(s): To be determined at a later time   Interventional management options: Ms. Restivo was informed that there is no guarantee that she would be a candidate for interventional therapies. The decision will be based on the results of diagnostic studies, as well as Ms. Aveni's risk profile.  Procedure(s) under consideration:  Pending results of ordered studies     Interventional Therapies  Risk Factors  Considerations  Medical Comorbidities:  AAo Atherosclerosis  HTN  GERD  Hx. Hematuria  Osteopenia  Ulcerative Colitis  Anxiety/Depression            Planned  Pending:      Under consideration:   Pending   Completed: (Analgesic benefit)1  None at this time   Therapeutic  Palliative (PRN) options:   None established   Completed by other providers:   None reported  1(Analgesic benefit): Expressed in percentage (%). (Local anesthetic[LA] +/- sedation  L.A.Local Anesthetic  Steroid benefit  Ongoing benefit)   Provider-requested follow-up: Return in about 2 weeks (around 01/31/2024) for ( ), Eval-day (M,W), (Face2F), 2nd Visit, to review of ordered test(s).  Future Appointments  Date Time Provider Department Center  03/13/2024  1:00 PM Zuleta, Kaitlin G, NP [facility]   I discussed the assessment and treatment plan with the patient. The patient was provided an opportunity to ask questions and all were answered. The patient agreed with the plan and demonstrated an understanding of the instructions.  Patient advised to call back or seek an in-person evaluation if the symptoms or condition worsens.  Duration of encounter: 50 minutes.  Total time on encounter, as per AMA guidelines included both the face-to-face and non-face-to-face time personally spent by the physician and/or other qualified health care professional(s) on the day of the encounter (includes  time in activities that require the physician or other qualified health care professional and does not include time in activities normally performed by clinical staff). Physician's time may include the following activities when performed: Preparing to see the patient (e.g., pre-charting review of records, searching for previously ordered imaging, lab work, and nerve conduction tests) Review of prior analgesic pharmacotherapies. Reviewing PMP Interpreting ordered tests (e.g., lab work, imaging, nerve conduction tests) Performing post-procedure evaluations, including interpretation of diagnostic procedures Obtaining and/or reviewing separately obtained history Performing a medically appropriate examination and/or evaluation Counseling and educating the patient/family/caregiver Ordering medications, tests, or procedures Referring and communicating with other health care professionals (when not separately reported) Documenting clinical information in the electronic or other health record Independently interpreting results (not separately reported) and communicating results to the patient/ family/caregiver Care coordination (not separately reported)  Note by: Eric DELENA Como, MD (TTS and AI technology used. I apologize for any typographical errors that were not detected and corrected.) Date: 01/17/2024; Time: 1:58 PM

## 2024-01-17 ENCOUNTER — Ambulatory Visit
Admission: RE | Admit: 2024-01-17 | Discharge: 2024-01-17 | Disposition: A | Discharge status: Home / Self Care | Attending: Pain Medicine | Admitting: Pain Medicine

## 2024-01-17 ENCOUNTER — Encounter: Payer: Self-pay | Admitting: Pain Medicine

## 2024-01-17 ENCOUNTER — Ambulatory Visit: Admitting: Pain Medicine

## 2024-01-17 ENCOUNTER — Ambulatory Visit: Admitting: Physical Therapy

## 2024-01-17 ENCOUNTER — Ambulatory Visit
Admission: RE | Admit: 2024-01-17 | Discharge: 2024-01-17 | Disposition: A | Source: Ambulatory Visit | Attending: Pain Medicine | Admitting: Pain Medicine

## 2024-01-17 VITALS — BP 120/66 | HR 89 | Temp 97.4°F | Ht 65.0 in | Wt 155.0 lb

## 2024-01-17 DIAGNOSIS — Z981 Arthrodesis status: Secondary | ICD-10-CM | POA: Diagnosis not present

## 2024-01-17 DIAGNOSIS — Z9889 Other specified postprocedural states: Secondary | ICD-10-CM

## 2024-01-17 DIAGNOSIS — M4323 Fusion of spine, cervicothoracic region: Secondary | ICD-10-CM

## 2024-01-17 DIAGNOSIS — G894 Chronic pain syndrome: Secondary | ICD-10-CM | POA: Diagnosis not present

## 2024-01-17 DIAGNOSIS — Z789 Other specified health status: Secondary | ICD-10-CM | POA: Diagnosis not present

## 2024-01-17 DIAGNOSIS — Z79899 Other long term (current) drug therapy: Secondary | ICD-10-CM

## 2024-01-17 DIAGNOSIS — G8929 Other chronic pain: Secondary | ICD-10-CM | POA: Insufficient documentation

## 2024-01-17 DIAGNOSIS — M47812 Spondylosis without myelopathy or radiculopathy, cervical region: Secondary | ICD-10-CM | POA: Diagnosis not present

## 2024-01-17 DIAGNOSIS — M542 Cervicalgia: Secondary | ICD-10-CM | POA: Diagnosis not present

## 2024-01-17 DIAGNOSIS — M899 Disorder of bone, unspecified: Secondary | ICD-10-CM

## 2024-01-17 DIAGNOSIS — M546 Pain in thoracic spine: Secondary | ICD-10-CM | POA: Insufficient documentation

## 2024-01-17 DIAGNOSIS — M549 Dorsalgia, unspecified: Secondary | ICD-10-CM | POA: Diagnosis not present

## 2024-01-17 DIAGNOSIS — M4184 Other forms of scoliosis, thoracic region: Secondary | ICD-10-CM | POA: Diagnosis not present

## 2024-01-17 NOTE — Progress Notes (Signed)
 Safety precautions to be maintained throughout the outpatient stay will include: orient to surroundings, keep bed in low position, maintain call bell within reach at all times, provide assistance with transfer out of bed and ambulation.

## 2024-01-20 LAB — COMPLIANCE DRUG ANALYSIS, UR

## 2024-01-21 DIAGNOSIS — Z79899 Other long term (current) drug therapy: Secondary | ICD-10-CM | POA: Diagnosis not present

## 2024-01-21 DIAGNOSIS — E782 Mixed hyperlipidemia: Secondary | ICD-10-CM | POA: Diagnosis not present

## 2024-01-21 DIAGNOSIS — R739 Hyperglycemia, unspecified: Secondary | ICD-10-CM | POA: Diagnosis not present

## 2024-01-22 LAB — COMP. METABOLIC PANEL (12)
AST: 17 IU/L (ref 0–40)
Albumin: 4.2 g/dL (ref 3.8–4.8)
Alkaline Phosphatase: 129 IU/L (ref 49–135)
BUN/Creatinine Ratio: 37 — ABNORMAL HIGH (ref 12–28)
BUN: 34 mg/dL — ABNORMAL HIGH (ref 8–27)
Bilirubin Total: 0.4 mg/dL (ref 0.0–1.2)
Calcium: 10.5 mg/dL — ABNORMAL HIGH (ref 8.7–10.3)
Chloride: 99 mmol/L (ref 96–106)
Creatinine, Ser: 0.92 mg/dL (ref 0.57–1.00)
Globulin, Total: 2.9 g/dL (ref 1.5–4.5)
Glucose: 90 mg/dL (ref 70–99)
Potassium: 4 mmol/L (ref 3.5–5.2)
Sodium: 138 mmol/L (ref 134–144)
Total Protein: 7.1 g/dL (ref 6.0–8.5)
eGFR: 65 mL/min/1.73 (ref >59)

## 2024-01-22 LAB — MAGNESIUM: Magnesium: 2 mg/dL (ref 1.6–2.3)

## 2024-01-22 LAB — SEDIMENTATION RATE: Sed Rate: 25 mm/h (ref 0–40)

## 2024-01-22 LAB — VITAMIN B12: Vitamin B-12: 1145 pg/mL (ref 232–1245)

## 2024-01-22 LAB — 25-HYDROXY VITAMIN D LCMS D2+D3
25-Hydroxy, Vitamin D-2: <1 ng/mL
25-Hydroxy, Vitamin D-3: 56 ng/mL
25-Hydroxy, Vitamin D: 56 ng/mL

## 2024-01-22 LAB — C-REACTIVE PROTEIN: CRP: 1 mg/L (ref 0–10)

## 2024-01-24 ENCOUNTER — Ambulatory Visit: Admitting: Physical Therapy

## 2024-01-28 DIAGNOSIS — E782 Mixed hyperlipidemia: Secondary | ICD-10-CM | POA: Diagnosis not present

## 2024-01-28 DIAGNOSIS — Z79899 Other long term (current) drug therapy: Secondary | ICD-10-CM | POA: Diagnosis not present

## 2024-01-28 DIAGNOSIS — Z0000 Encounter for general adult medical examination without abnormal findings: Secondary | ICD-10-CM | POA: Diagnosis not present

## 2024-01-28 DIAGNOSIS — M5010 Cervical disc disorder with radiculopathy, unspecified cervical region: Secondary | ICD-10-CM | POA: Diagnosis not present

## 2024-01-28 DIAGNOSIS — R739 Hyperglycemia, unspecified: Secondary | ICD-10-CM | POA: Diagnosis not present

## 2024-01-28 DIAGNOSIS — Z23 Encounter for immunization: Secondary | ICD-10-CM | POA: Diagnosis not present

## 2024-01-31 ENCOUNTER — Ambulatory Visit: Admitting: Physical Therapy

## 2024-02-07 ENCOUNTER — Ambulatory Visit: Admitting: Physical Therapy

## 2024-02-08 ENCOUNTER — Ambulatory Visit: Admitting: Physical Therapy

## 2024-02-14 ENCOUNTER — Ambulatory Visit: Admitting: Physical Therapy

## 2024-02-15 DIAGNOSIS — T85848A Pain due to other internal prosthetic devices, implants and grafts, initial encounter: Secondary | ICD-10-CM | POA: Insufficient documentation

## 2024-02-15 DIAGNOSIS — Z981 Arthrodesis status: Secondary | ICD-10-CM | POA: Insufficient documentation

## 2024-02-15 NOTE — Progress Notes (Unsigned)
 PROVIDER NOTE: Interpretation of information contained herein should be left to medically-trained personnel. Specific patient instructions are provided elsewhere under Patient Instructions section of medical record. This document was created in part using AI and STT-dictation technology, any transcriptional errors that may result from this process are unintentional.  Patient: April Larsen  Service: E/M   PCP: Cleotilde Oneil FALCON, MD  DOB: January 19, 1946  DOS: 02/16/2024  Provider: Eric DELENA Como, MD  MRN: [id number]  Delivery: Face-to-face  Specialty: Interventional Pain Management  Type: Established Patient  Setting: Ambulatory outpatient facility  Specialty designation: 09  Referring Prov.: Cleotilde Oneil FALCON, MD  Location: Outpatient office facility       Primary Reason(s) for Visit: Encounter for evaluation before starting new chronic pain management plan of care (Level of risk: moderate) CC: No chief complaint on file.  HPI  April Larsen is a 78 y.o. year old, female patient, who comes today for a follow-up evaluation to review the test results and decide on a treatment plan. She has Ulcerative colitis without complications (HCC); Diverticulitis; Female stress incontinence; Gastroesophageal reflux disease without esophagitis; Incomplete emptying of bladder; Migraine; Postmenopausal; Idiopathic scoliosis and kyphoscoliosis; Scoliosis of lumbosacral spine; Trochanteric bursitis; Urethral prolapse; Urge incontinence; Diverticulitis of large intestine; Generalized osteoarthritis; History of 2019 novel coronavirus disease (COVID-19); Hyperlipidemia, mixed; Major depressive disorder, recurrent, mild; Medicare annual wellness visit, initial; Osteopenia of multiple sites; Primary osteoarthritis of right knee; Hiatal hernia; History of repair of paraesophageal hernia; Mild dementia (HCC); Vertigo; Essential hypertension; Spinal surgery in prior 3 months for correction of scoliosis; Fall (on) (from) other stairs and  steps 06/14/21, subsequent encounter; Post concussion syndrome; Fall; Primary hyperthyroidism; External hordeolum; Anxiety; Atherosclerosis of abdominal aorta; Cervical spondylosis; Chronic low back pain; Hyperreflexia; Lumbar foraminal stenosis; Neck pain; Scoliosis due to degenerative disease of spine in adult patient; Polyp of descending colon; Total knee replacement status; Iron deficiency anemia; Polyp of transverse colon; Cervical spondylosis with myelopathy and radiculopathy; Fracture of one rib, left side, subsequent encounter for fracture with routine healing; Other specified disorders of bone density and structure, multiple sites; Recurrent UTI; Gross hematuria; Vaginal atrophy; Incontinence of feces; Degenerative arthritis of proximal interphalangeal joint of middle finger of right hand; Vitamin D  deficiency; Cervical disc disease; Thyrotoxicosis; Idiopathic scoliosis of lumbar spine; Nutritional anemia; Disorder of bone; Dizziness and giddiness; Chronic pain syndrome; Pharmacologic therapy; Disorder of skeletal system; Problems influencing health status; Chronic thoracic back pain (Bilateral); Chronic neck and back pain (Bilateral); Fusion of spine of cervicothoracic region; Personal history of spine surgery; History of fusion of thoracolumbar spine; and Pain from implanted hardware on their problem list. Her primarily concern today is the No chief complaint on file.  Pain Assessment: Location:     Radiating:   Onset:   Duration:   Quality:   Severity:  /10 (subjective, self-reported pain score)  Effect on ADL:   Timing:   Modifying factors:   BP:    HR:    April Larsen comes in today for a follow-up visit after her initial evaluation on 01/17/2024. Today we went over the results of her tests. These were explained in Layman's terms. During today's appointment we went over my diagnostic impression, as well as the proposed treatment plan.  Review of initial evaluation (01/17/2024): April Larsen is a 78 year old female with scoliosis and ulcerative colitis who presents with worsening back and neck pain. She was referred by a nurse practitioner for pain management.   April Larsen has a history of  scoliosis and has undergone back and neck surgeries, with the latter performed in February 2025. Despite these interventions, she experiences worsening pain, particularly in the neck and shoulder blade area, with occasional radiation to the elbows. The pain is severe, described as feeling like 'fire underneath my skin' by nighttime.   She is prescribed OxyContin  but is hesitant to use it regularly due to dependency concerns. She currently takes gabapentin  300 mg at night, sometimes splitting the dose between afternoon and night. She experiences stiffness and soreness upon waking, which improves throughout the day.   Her medical history includes ulcerative colitis, managed with bi-weekly Entyvio injections and a stool-forming powder. She avoids anti-inflammatories due to her gastrointestinal history. Her daughter is concerned about her mobility, fearing potential wheelchair dependence if her condition worsens. April Larsen notes increased pain since her neck surgery, despite an improved appearance of her back.         Review of diagnostic test ordered on 01/17/2024:  Diagnostic lab work: Vitamin D  levels, C-reactive protein, magnesium , sed rate, and vitamin B12 levels are all within normal limits.  UDS did not demonstrate any illicit substances but it also did not show any oxycodone  in her system.  Comprehensive metabolic panel showed an elevated BUN level of 34 mg/dL (normal 1-72), and elevated BUN/creatinine ratio of 37 (normal 12-28), and an elevated calcium  level of 10.5 mg/dL (normal 1.2-89.6). Diagnostic imaging: Diagnostic x-rays of the cervical spine with flexion and extension views demonstrated no acute fracture or subluxation of the cervical spine.  It did demonstrate multilevel degenerative  changes with evidence of a C3-C7 ACDF.  No listhesis on flexion or extension.  Diagnostic x-rays of the thoracic spine show status post thoracolumbar fixation without acute abnormalities.  There is evidence of cervical fusion from C3-C7.  No soft tissue abnormalities.  Stable scoliosis of the thoracolumbar spine.  Multilevel thoracolumbar fixation hardware seen and stable.  Discussed the use of AI scribe software for clinical note transcription with the patient, who gave verbal consent to proceed.  History of Present Illness          Patient presented with interventional treatment options. Ms. Brazell was informed that I will not be providing medication management. Pharmacotherapy evaluation including recommendations may be offered, if specifically requested.   Controlled Substance Pharmacotherapy Assessment REMS (Risk Evaluation and Mitigation Strategy)  Opioid Analgesic: None MME/day: 0 mg/day   Pill Count: None expected due to no prior prescriptions written by our practice. No notes on file  Pharmacokinetics: Liberation and absorption (onset of action): WNL Distribution (time to peak effect): WNL Metabolism and excretion (duration of action): WNL         Pharmacodynamics: Desired effects: Analgesia: April Larsen reports >50% benefit. Functional ability: Patient reports that medication allows her to accomplish basic ADLs Clinically meaningful improvement in function (CMIF): Sustained CMIF goals met Perceived effectiveness: Described as relatively effective, allowing for increase in activities of daily living (ADL) Undesirable effects: Side-effects or Adverse reactions: None reported Monitoring: [place] PMP: PDMP reviewed during this encounter. Online review of the past 72-month period previously conducted. Not applicable at this point since we have not taken over the patient's medication management yet. List of other Serum/Urine Drug Screening Test(s):  No results found for: AMPHSCRSER,  BARBSCRSER, BENZOSCRSER, COCAINSCRSER, COCAINSCRNUR, PCPSCRSER, THCSCRSER, THCU, CANNABQUANT, OPIATESCRSER, OXYSCRSER, PROPOXSCRSER, ETH, CBDTHCR, D8THCCBX, D9THCCBX List of all UDS test(s) done:  Lab Results  Component Value Date   SUMMARY FINAL 01/17/2024   Last UDS on record: Summary  Date Value  Ref Range Status  01/17/2024 FINAL  Final    Comment:    ==================================================================== Compliance Drug Analysis, Ur ==================================================================== Test                             Result       Flag       Units  Drug Present and Declared for Prescription Verification   Gabapentin                      PRESENT      EXPECTED   Duloxetine                      PRESENT      EXPECTED   Acetaminophen                   PRESENT      EXPECTED  Drug Present not Declared for Prescription Verification   Diphenhydramine                 PRESENT      UNEXPECTED  Drug Absent but Declared for Prescription Verification   Oxycodone                       Not Detected UNEXPECTED ng/mg creat ==================================================================== Test                      Result    Flag   Units      Ref Range   Creatinine              30               mg/dL      >=79 ==================================================================== Declared Medications:  The flagging and interpretation on this report are based on the  following declared medications.  Unexpected results may arise from  inaccuracies in the declared medications.   **Note: The testing scope of this panel includes these medications:   Duloxetine   Gabapentin   Oxycodone    **Note: The testing scope of this panel does not include small to  moderate amounts of these reported medications:   Acetaminophen    **Note: The testing scope of this panel does not include the  following reported medications:   Adalimumab  (Humira )   Cephalexin   Cholestyramine  (Questran )  Cyanocobalamin   Dexamethasone  (Maxitrol )  Docusate (Colace)  Estradiol  (Estrace )  Fluticasone (Flonase)  Hydrochlorothiazide  (Dyazide )  Magnesium   Meclizine  (Antivert )  Methimazole  (Tapazole )  Neomycin  (Maxitrol )  Pantoprazole  (Protonix )  Polymyxin B (Maxitrol )  Supplement  Triamterene  (Dyazide )  Vedolizumab (Entyvio)  Vitamin C   Vitamin D  ==================================================================== For clinical consultation, please call [phone]. ====================================================================    UDS interpretation: No unexpected findings.          Medication Assessment Form: Not applicable. No opioids. Treatment compliance: Not applicable Risk Assessment Profile: Aberrant behavior: See initial evaluations. None observed or detected today Comorbid factors increasing risk of overdose: See initial evaluation. No additional risks detected today Opioid risk tool (ORT):     01/17/2024    1:03 PM  Opioid Risk   Alcohol 0  Illegal Drugs 0  Rx Drugs 0  Alcohol 0  Illegal Drugs 0  Rx Drugs 0  Age between 16-45 years  0  Psychological Disease 0  Depression 0  Opioid Risk Tool Scoring 0  Opioid Risk Interpretation Low Risk    ORT Scoring interpretation table:  Score <3 = Low Risk for  SUD  Score between 4-7 = Moderate Risk for SUD  Score >8 = High Risk for Opioid Abuse   Risk of substance use disorder (SUD): Low  Risk Mitigation Strategies:  Patient opioid safety counseling: No controlled substances prescribed. Patient-Prescriber Agreement (PPA): No agreement signed.  Controlled substance notification to other providers: None required. No opioid therapy.  Pharmacologic Plan: Non-opioid analgesic therapy offered. Interventional alternatives discussed.             Laboratory Chemistry Profile   Renal Lab Results  Component Value Date   BUN 34 (H) 01/17/2024   CREATININE 0.92 01/17/2024    BCR 37 (H) 01/17/2024   GFRAA >60 01/14/2019   GFRNONAA >60 05/07/2023   SPECGRAV 1.015 11/18/2023   PHUR 5.5 11/18/2023   PROTEINUR NEGATIVE 05/01/2022     Electrolytes Lab Results  Component Value Date   NA 138 01/17/2024   K 4.0 01/17/2024   CL 99 01/17/2024   CALCIUM  10.5 (H) 01/17/2024   MG 2.0 01/17/2024   PHOS 3.6 06/29/2021     Hepatic Lab Results  Component Value Date   AST 17 01/17/2024   ALT 16 06/07/2023   ALBUMIN  4.2 01/17/2024   ALKPHOS 129 01/17/2024   LIPASE 27 06/15/2021     ID Lab Results  Component Value Date   SARSCOV2NAA NEGATIVE 06/27/2021   STAPHAUREUS NEGATIVE 05/07/2023   MRSAPCR NEGATIVE 05/07/2023     Bone Lab Results  Component Value Date   VD25OH 29.9 (L) 06/16/2023   VD125OH2TOT 25.4 06/29/2021   25OHVITD1 56 01/17/2024   25OHVITD2 <1.0 01/17/2024   25OHVITD3 56 01/17/2024     Endocrine Lab Results  Component Value Date   GLUCOSE 90 01/17/2024   GLUCOSEU NEGATIVE 05/01/2022   TSH <0.010 (L) 06/27/2021   FREET4 3.25 (H) 06/29/2021     Neuropathy Lab Results  Component Value Date   VITAMINB12 1,145 01/17/2024     CNS No results found for: COLORCSF, APPEARCSF, RBCCOUNTCSF, WBCCSF, POLYSCSF, LYMPHSCSF, EOSCSF, PROTEINCSF, GLUCCSF, JCVIRUS, CSFOLI, IGGCSF, LABACHR, ACETBL   Inflammation (CRP: Acute  ESR: Chronic) Lab Results  Component Value Date   CRP 1 01/17/2024   ESRSEDRATE 25 01/17/2024     Rheumatology No results found for: RF, ANA, LABURIC, URICUR, LYMEIGGIGMAB, LYMEABIGMQN, HLAB27   Coagulation Lab Results  Component Value Date   PLT 333 05/07/2023   DDIMER 0.58 (H) 01/13/2019     Cardiovascular Lab Results  Component Value Date   BNP 12.3 06/27/2021   TROPONINI <0.03 08/24/2015   HGB 14.2 05/07/2023   HCT 42.1 05/07/2023     Screening Lab Results  Component Value Date   SARSCOV2NAA NEGATIVE 06/27/2021   STAPHAUREUS NEGATIVE 05/07/2023   MRSAPCR  NEGATIVE 05/07/2023     Cancer No results found for: CEA, CA125, LABCA2   Allergens No results found for: ALMOND, APPLE, ASPARAGUS, AVOCADO, BANANA, BARLEY, BASIL, BAYLEAF, GREENBEAN, LIMABEAN, WHITEBEAN, BEEFIGE, REDBEET, BLUEBERRY, BROCCOLI, CABBAGE, MELON, CARROT, CASEIN, CASHEWNUT, CAULIFLOWER, CELERY     Note: Lab results reviewed.  Recent Diagnostic Imaging Review  Cervical Imaging: Cervical MR wo contrast: Results for orders placed during the hospital encounter of 02/21/23 MR CERVICAL SPINE WO CONTRAST  Narrative CLINICAL DATA:  Chronic neck pain radiating to both arms  EXAM: MRI CERVICAL SPINE WITHOUT CONTRAST  TECHNIQUE: Multiplanar, multisequence MR imaging of the cervical spine was performed. No intravenous contrast was administered.  COMPARISON:  06/16/2021  FINDINGS: Alignment: Physiologic.  Vertebrae: No acute fracture, evidence of discitis, or aggressive bone lesion.  Cord: Normal signal and morphology.  Posterior Fossa, vertebral arteries, paraspinal tissues: Posterior fossa demonstrates no focal abnormality. Vertebral artery flow voids are maintained. Paraspinal soft tissues are unremarkable.  Disc levels:  Discs: Moderate disc height loss at C3-4, C4-5, C5-6 and C6-7.  C2-3: Mild the spillage. Mild bilateral facet arthropathy. Moderate bilateral foraminal stenosis. No central canal stenosis.  C3-4: Moderate broad disc osteophyte complex mildly impressing on the ventral cervical spinal cord. Moderate left facet arthropathy. Bilateral uncovertebral degenerative changes. Moderate right and severe left foraminal stenosis. Moderate central canal stenosis.  C4-5: Mild disc osteophyte complex. Severe left and mild right facet arthropathy. Bilateral uncovertebral degenerative changes. Severe bilateral foraminal stenosis. Moderate spinal stenosis.  C5-6: Mild disc osteophyte complex. Bilateral  uncovertebral degenerative changes, right worse than left. Mild bilateral facet arthropathy. Severe right and moderate-severe left foraminal stenosis. Moderate spinal stenosis.  C6-7: Moderate disc osteophyte complex. Bilateral uncovertebral degenerative changes. Moderate bilateral facet arthropathy. Severe bilateral foraminal stenosis, right worse than left. Mild spinal stenosis.  C7-T1: Mild disc bulge. Moderate bilateral facet arthropathy. No significant foraminal or central canal stenosis.  T2-3: Mild disc bulge.  No foraminal or central canal stenosis.  IMPRESSION: 1. Diffuse cervical spine spondylosis as described above. 2. No acute osseous injury of the cervical spine.   Electronically Signed By: Julaine Blanch M.D. On: 03/09/2023 16:01  Cervical CT wo contrast: Results for orders placed during the hospital encounter of 06/27/21 CT Cervical Spine Wo Contrast  Narrative CLINICAL DATA:  Multiple falls  EXAM: CT CERVICAL SPINE WITHOUT CONTRAST  TECHNIQUE: Multidetector CT imaging of the cervical spine was performed without intravenous contrast. Multiplanar CT image reconstructions were also generated.  RADIATION DOSE REDUCTION: This exam was performed according to the departmental dose-optimization program which includes automated exposure control, adjustment of the mA and/or kV according to patient size and/or use of iterative reconstruction technique.  COMPARISON:  06/14/2021  FINDINGS: Alignment: Alignment normal.  Skull base and vertebrae: Osseous demineralization. Multilevel disc space narrowing and endplate spur formation. Multilevel facet degenerative changes. Vertebral body heights maintained. Skull base intact. No fracture, subluxation, or bone destruction.  Soft tissues and spinal canal: Prevertebral soft tissues normal thickness. Soft tissues unremarkable.  Disc levels:  No significant abnormalities  Upper chest: Lung apices clear  Other:  Orthopedic hardware upper thoracic spine.  IMPRESSION: Multilevel degenerative disc and facet disease changes of the cervical spine.  Osseous demineralization.  No acute cervical spine abnormalities.   Electronically Signed By: Oneil Kiss M.D. On: 06/27/2021 16:01  Cervical DG Bending/F/E views: Results for orders placed during the hospital encounter of 01/17/24 DG Cervical Spine With Flex & Extend  Narrative CLINICAL DATA:  Neck pain.  EXAM: DG CERVICAL SPINE COMP WITH FLEX  COMPARISON:  Cervical spine radiograph dated 10/15/2023.  FINDINGS: No acute fracture or subluxation. Evaluation for fracture however is very limited due to advanced osteopenia and degenerative changes. C3-C7 ACDF. No listhesis on flexion or extension. The visualized posterior elements and odontoid appear intact. There is anatomic alignment of the lateral masses of C1 and C2. The soft tissues are unremarkable.  IMPRESSION: 1. No acute fracture or subluxation of the cervical spine. 2. Multilevel degenerative changes.   Electronically Signed By: Vanetta Chou M.D. On: 01/19/2024 20:00  Thoracic Imaging: Thoracic DG w/swimmers view: Results for orders placed during the hospital encounter of 01/17/24 [facility] Thoracic Spine W/Swimmers  Narrative CLINICAL DATA:  Upper back pain, history of prior cervical and thoracolumbar fusion, initial encounter  EXAM: THORACIC  SPINE - 3 VIEWS  COMPARISON:  06/15/2021  FINDINGS: Multilevel thoracolumbar fixation hardware is again seen and stable. No definitive loosening is seen. Cervical fusion from C3 to C7 is noted. No soft tissue abnormality is noted. Stable scoliosis of the thoracic spine is noted.  IMPRESSION: Status post thoracolumbar fixation without acute abnormality.  Electronically Signed By: Oneil Devonshire M.D. On: 01/19/2024 20:01   Lumbosacral Imaging: Lumbar MR wo contrast: Results for orders placed during the hospital encounter of  09/29/20 MR LUMBAR SPINE WO CONTRAST  Narrative CLINICAL DATA:  Chronic low back pain weakness  EXAM: MRI LUMBAR SPINE WITHOUT CONTRAST  TECHNIQUE: Multiplanar, multisequence MR imaging of the lumbar spine was performed. No intravenous contrast was administered.  COMPARISON:  08/24/2014  FINDINGS: Segmentation: Normal  Alignment: Levoscoliosis with apex at L2. Left lateral subluxation of L3 on L4.  Vertebrae:  No fracture, evidence of discitis, or bone lesion.  Conus medullaris and cauda equina: Conus extends to the L1 level. Conus and cauda equina appear normal.  Paraspinal and other soft tissues: Negative.  Disc levels:  L1-L2: Sagittal images. Small disc bulge. No spinal canal stenosis. Unchanged severe right and left neural foraminal stenosis.  L2-L3: Sagittal images only. Small disc bulge. No spinal canal stenosis. Unchanged severe right and mild left neural foraminal stenosis.  L3-L4: Sagittal images only. Small disc bulge with endplate spurring. No spinal canal stenosis. Unchanged severe right and mild left neural foraminal stenosis.  L4-L5: Unchanged small left asymmetric disc bulge Left lateral recess narrowing without central spinal canal stenosis. Unchanged severe left neural foraminal stenosis.  L5-S1: Left asymmetric disc bulge with moderate facet hypertrophy, unchanged. No spinal canal stenosis. Unchanged mild left neural foraminal stenosis.  Visualized sacrum: Normal.  IMPRESSION: 1. Unchanged examination with moderate-to-severe neural foraminal stenosis at multiple levels. 2. No central spinal canal stenosis. 3. Marked levoscoliosis with apex at L2.   Electronically Signed By: Franky Stanford M.D. On: 09/30/2020 20:24  Hip Imaging: Hip-R DG 2-3 views: Results for orders placed during the hospital encounter of 09/03/15 DG Hip Unilat  With Pelvis 2-3 Views Right  Narrative CLINICAL DATA:  Patient reports fall x1 day ago. Pain on  posterior lower back radiating into right hip and leg. No previous injuries to hip.  EXAM: DG HIP (WITH OR WITHOUT PELVIS) 2-3V RIGHT  COMPARISON:  CT 08/24/2015 and previous  FINDINGS: Lumbar levoscoliosis, incompletely visualized. Bony pelvis intact. Bilateral hips unremarkable.  IMPRESSION: 1. Negative right hip. 2. Lumbar levoscoliosis as before, incompletely visualized.   Electronically Signed By: JONETTA Faes M.D. On: 09/03/2015 12:04  Knee Imaging: Knee-R MR wo contrast: Results for orders placed during the hospital encounter of 01/19/22 MR KNEE RIGHT WO CONTRAST  Narrative CLINICAL DATA:  Knee pain for 1-2 years. Weakness with stair climbing. No known injury or prior relevant surgery.  EXAM: MRI OF THE RIGHT KNEE WITHOUT CONTRAST  TECHNIQUE: Multiplanar, multisequence MR imaging of the knee was performed. No intravenous contrast was administered.  COMPARISON:  None Available.  FINDINGS: MENISCI  Medial meniscus: Oblique tear of the posterior horn and body, most obvious on the coronal images. There is resulting peripheral extrusion of the meniscus from the joint space with mild caudal displacement of a small flap fragment into the meniscotibial recess. The meniscal root appears intact. No centrally displaced meniscal fragments are identified.  Lateral meniscus:  Intact with normal morphology.  LIGAMENTS  Cruciates:  Intact.  Collaterals: Intact. Small amount of fluid within the pes anserine bursa.  CARTILAGE  Patellofemoral: Diffuse patellofemoral chondral thinning with osteophytes and subchondral cyst formation.  Medial: Moderate medial compartment osteoarthritis with diffuse chondral thinning, osteophytes and subchondral edema in the femoral condyle and tibial plateau.  Lateral: Milder lateral compartment osteoarthritis with full-thickness chondral thinning over the central aspect of the lateral tibial plateau with underlying subchondral  cyst formation. Prominent peripheral osteophytes.  MISCELLANEOUS  Joint: Moderate-sized joint effusion with diffuse synovitis. No discrete loose bodies identified.  Popliteal Fossa: The popliteus muscle and tendon are intact. Tiny Baker's cyst.  Extensor Mechanism:  Intact.  Bones: No acute osseous findings are evident. There is a septated T2 hyperintense lesion within the fibular head measuring approximately 1.9 x 1.4 x 1.3 cm. This appears nonaggressive, without endosteal thinning, pathologic fracture or adjacent soft tissue mass, and probably reflects an incidental intraosseous ganglion. There is a smaller cystic lesion posteriorly in the proximal tibia measuring 10 mm in diameter. No worrisome osseous lesions.  Other: Mild anterior subcutaneous edema without other focal soft tissue abnormality.  IMPRESSION: 1. Oblique tear of the posterior horn and body of the medial meniscus with resulting peripheral extrusion of the meniscus from the joint space and a small flap fragment displaced into the meniscotibial recess. 2. Tricompartmental osteoarthritis, most advanced in the medial compartment. 3. Moderate-sized joint effusion with diffuse synovitis. 4. Intact lateral meniscus, cruciate and collateral ligaments.   Electronically Signed By: Elsie Perone M.D. On: 01/21/2022 11:05  Complexity Note: Imaging results reviewed.                         Meds   Current Outpatient Medications:    adalimumab  (HUMIRA , 2 PEN,) 40 MG/0.8ML AJKT pen, , Disp: , Rfl:    Ascorbic Acid  (VITAMIN C ) 1000 MG tablet, Take 1,000 mg by mouth daily., Disp: , Rfl:    Cephalexin  250 MG tablet, Take 1 tablet (250 mg total) by mouth daily. (Patient not taking: Reported on 01/17/2024), Disp: 30 tablet, Rfl: 5   Cholecalciferol  (VITAMIN D ) 50 MCG (2000 UT) tablet, Take 2,000 Units by mouth daily., Disp: , Rfl:    cholestyramine  (QUESTRAN ) 4 g packet, MIX 1 PACKET IN LIQUID AND TAKE BY MOUTH TWICE  DAILY, Disp: 60 each, Rfl: 4   docusate sodium  (COLACE) 100 MG capsule, Take 1 capsule (100 mg total) by mouth 2 (two) times daily., Disp: 10 capsule, Rfl: 0   DULoxetine  (CYMBALTA ) 60 MG capsule, Take 60 mg by mouth at bedtime. , Disp: , Rfl: 3   ENTYVIO 300 MG injection, Inject into the vein., Disp: , Rfl:    estradiol  (ESTRACE ) 0.1 MG/GM vaginal cream, Place 0.5g nightly for two weeks then twice a week after, Disp: 42.5 g, Rfl: 11   fluticasone (ALLERGY RELIEF) 50 MCG/ACT nasal spray, Place 1 spray into both nostrils daily as needed for allergies or rhinitis., Disp: , Rfl:    gabapentin  (NEURONTIN ) 300 MG capsule, Take 300 mg by mouth at bedtime., Disp: , Rfl:    Magnesium  250 MG TABS, Take 250 mg by mouth daily., Disp: , Rfl:    meclizine  (ANTIVERT ) 25 MG tablet, Take 1 tablet (25 mg total) by mouth 3 (three) times daily as needed for dizziness or nausea., Disp: 30 tablet, Rfl: 0   methimazole  (TAPAZOLE ) 5 MG tablet, Take 2.5 mg by mouth daily., Disp: , Rfl:    Misc Natural Products (OSTEO BI-FLEX ADV JOINT SHIELD PO), Take 1 tablet by mouth daily., Disp: , Rfl:    neomycin -polymyxin  b-dexamethasone  (MAXITROL ) 3.5-10000-0.1 SUSP, INSTILL 1 DROP INTO AFFECTED EYE THREE TIMES DAILY, Disp: , Rfl:    oxyCODONE -acetaminophen  (PERCOCET/ROXICET) 5-325 MG tablet, Take 1-2 tablets by mouth every 4 (four) hours as needed for moderate pain (pain score 4-6)., Disp: 30 tablet, Rfl: 0   pantoprazole  (PROTONIX ) 40 MG tablet, Take 40 mg by mouth 2 (two) times daily., Disp: , Rfl:    triamterene -hydrochlorothiazide  (DYAZIDE ) 37.5-25 MG capsule, Take 1 capsule by mouth daily., Disp: , Rfl:    vitamin B-12 (CYANOCOBALAMIN ) 500 MCG tablet, Take 500 mcg by mouth daily., Disp: , Rfl:   ROS  Constitutional: Denies any fever or chills Gastrointestinal: No reported hemesis, hematochezia, vomiting, or acute GI distress Musculoskeletal: Denies any acute onset joint swelling, redness, loss of ROM, or  weakness Neurological: No reported episodes of acute onset apraxia, aphasia, dysarthria, agnosia, amnesia, paralysis, loss of coordination, or loss of consciousness  Allergies  April Larsen is allergic to dilaudid  [hydromorphone ], nsaids, and sulfa antibiotics.  PFSH  Drug: April Larsen  reports no history of drug use. Alcohol:  reports no history of alcohol use. Tobacco:  reports that she has never smoked. She has never used smokeless tobacco. Medical:  has a past medical history of Actinic keratosis, Anemia, Anxiety, Arthritis, Cancer (HCC) (2016), Cataract (lens) fragments in eye following cataract surgery, bilateral (2013), Chronic ulcerative colitis, without complications (HCC) (10/29/2015), Diverticulitis (2018), Esophagitis, reflux (10/04/2014), Family history of adverse reaction to anesthesia, Female stress incontinence (08/09/2013), Foreign body in alimentary tract (07/23/2022), Gastroesophageal reflux disease without esophagitis (08/06/2016), GERD (gastroesophageal reflux disease), Headache, History of hiatal hernia, Hypertension, Hyperthyroidism, Incomplete emptying of bladder (10/29/2013), Migraine (07/26/2013), Pneumonia due to COVID-19 virus (2020), PONV (postoperative nausea and vomiting), Postherpetic neuralgia, Postmenopausal (11/09/2016), PVC (premature ventricular contraction), Scoliosis, Scoliosis (and kyphoscoliosis), idiopathic (07/26/2013), Scoliosis of lumbosacral spine (01/18/2015), Squamous cell carcinoma of skin (03/13/2013), Squamous cell carcinoma of skin (03/10/2018), Squamous cell carcinoma of skin (02/21/2019), Squamous cell carcinoma of skin (06/01/2019), Trochanteric bursitis (11/29/2014), Ulcerative colitis (HCC), Urethral prolapse (08/09/2013), and Urge incontinence (08/09/2013). Surgical: April Larsen  has a past surgical history that includes Carpal tunnel release (Bilateral); Back surgery (03/12/2021); Abdominal hysterectomy; Colonoscopy (11/06/2005);  Esophagogastroduodenoscopy (11/06/2005); Esophagogastroduodenoscopy (egd) with propofol  (N/A, 07/29/2016); Colonoscopy with propofol  (N/A, 07/29/2016); Cholecystectomy (N/A, 08/21/2016); Laparoscopic low anterior resection (11/24/2016); Esophagogastroduodenoscopy (egd) with propofol  (N/A, 02/07/2020); Xi robotic assisted paraesophageal hernia repair (N/A, 02/27/2020); Partial colectomy (2018); Colonoscopy with propofol  (N/A, 08/22/2020); Cataract extraction w/ intraocular lens  implant, bilateral; Colonoscopy with propofol  (N/A, 01/22/2022); Esophagogastroduodenoscopy (egd) with propofol  (N/A, 01/22/2022); polypectomy (01/22/2022); Lumbar laminectomy (2004); Posterior laminectomy / decompression lumbar spine (2009); Colonoscopy (2010); Colonoscopy (2013); Tonsillectomy; Givens capsule study (N/A, 05/07/2022); Knee Arthroplasty (Right, 05/13/2022); Eye surgery; Givens capsule study (N/A, 06/24/2022); Esophagogastroduodenoscopy (N/A, 06/24/2022); Colonoscopy with propofol  (N/A, 07/23/2022); Colonoscopy with propofol  (N/A, 03/01/2023); biopsy (03/01/2023); polypectomy (03/01/2023); and Anterior cervical decompression/discectomy fusion 4 level (N/A, 05/13/2023). Family: family history includes Breast cancer in her cousin; Breast cancer (age of onset: 12) in her paternal aunt; Breast cancer (age of onset: 39) in her sister.  Constitutional Exam  General appearance: Well nourished, well developed, and well hydrated. In no apparent acute distress There were no vitals filed for this visit. BMI Assessment: Estimated body mass index is 25.79 kg/m as calculated from the following:   Height as of 01/17/24: 5' 5 (1.651 m).   Weight as of 01/17/24: 155 lb (70.3 kg).  BMI interpretation table: BMI level Category Range association with higher incidence of chronic pain  <18 kg/m2 Underweight  18.5-24.9 kg/m2 Ideal body weight   25-29.9 kg/m2 Overweight Increased incidence by 20%  30-34.9 kg/m2 Obese (Class I) Increased  incidence by 68%  35-39.9 kg/m2 Severe obesity (Class II) Increased incidence by 136%  >40 kg/m2 Extreme obesity (Class III) Increased incidence by 254%   Patient's current BMI Ideal Body weight  There is no height or weight on file to calculate BMI. Patient weight not recorded   BMI Readings from Last 4 Encounters:  01/17/24 25.79 kg/m  09/20/23 29.27 kg/m  06/07/23 27.85 kg/m  05/13/23 26.95 kg/m   Wt Readings from Last 4 Encounters:  01/17/24 155 lb (70.3 kg)  09/20/23 166 lb 12.8 oz (75.7 kg)  06/07/23 167 lb 6 oz (75.9 kg)  05/13/23 167 lb (75.8 kg)    Psych/Mental status: Alert, oriented x 3 (person, place, & time)       Eyes: PERLA Respiratory: No evidence of acute respiratory distress  Assessment & Plan  Primary Diagnosis & Pertinent Problem List: The primary encounter diagnosis was Chronic neck and back pain (Bilateral). Diagnoses of Chronic thoracic back pain (Bilateral), Cervical disc disease, Fusion of spine of cervicothoracic region, Personal history of spine surgery, History of fusion of thoracolumbar spine, and Pain from implanted hardware, sequela were also pertinent to this visit. Visit Diagnosis: 1. Chronic neck and back pain (Bilateral)   2. Chronic thoracic back pain (Bilateral)   3. Cervical disc disease   4. Fusion of spine of cervicothoracic region   5. Personal history of spine surgery   6. History of fusion of thoracolumbar spine   7. Pain from implanted hardware, sequela    Problems updated and reviewed during this visit: Problem  History of Fusion of Thoracolumbar Spine  Pain From Implanted Hardware  Primary Hyperthyroidism  Medicare Annual Wellness Visit, Initial   Formatting of this note might be different from the original. 8/18, 2/19, 9/20, 9/21  10/25     Plan of Care  Assessment and Plan             Pharmacotherapy (Medications Ordered): No orders of the defined types were placed in this encounter.  Procedure Orders     No procedure(s) ordered today   No orders of the defined types were placed in this encounter.  Lab Orders  No laboratory test(s) ordered today   Imaging Orders  No imaging studies ordered today   Referral Orders  No referral(s) requested today    Pharmacological management:  Opioid Analgesics: I will not be prescribing any opioids at this time Membrane stabilizer: I will not be prescribing any at this time Muscle relaxant: I will not be prescribing any at this time NSAID: I will not be prescribing any at this time Other analgesic(s): I will not be prescribing any at this time      Interventional Therapies  Risk Factors  Considerations  Medical Comorbidities:  AAo Atherosclerosis  HTN  GERD  Hx. Hematuria  Osteopenia  Ulcerative Colitis  Anxiety/Depression            Planned  Pending:      Under consideration:   Pending   Completed: (Analgesic benefit)1  None at this time   Therapeutic  Palliative (PRN) options:   None established   Completed by other providers:   None reported  1(Analgesic benefit): Expressed in percentage (%). (Local anesthetic[LA] +/- sedation  L.A.Local Anesthetic  Steroid benefit  Ongoing benefit)      Provider-requested follow-up: No follow-ups on file. Recent Visits Date Type Provider  Dept  01/17/24 Office Visit Tanya Glisson, MD [facility]  Showing recent visits within past 90 days and meeting all other requirements Future Appointments Date Type Provider Dept  02/16/24 Appointment Tanya Glisson, MD [facility]  Showing future appointments within next 90 days and meeting all other requirements   Primary Care Physician: Cleotilde Oneil FALCON, MD  Duration of encounter: *** minutes.  Total time on encounter, as per AMA guidelines included both the face-to-face and non-face-to-face time personally spent by the physician and/or other qualified health care professional(s) on the day of the encounter  (includes time in activities that require the physician or other qualified health care professional and does not include time in activities normally performed by clinical staff). Physician's time may include the following activities when performed: Preparing to see the patient (e.g., pre-charting review of records, searching for previously ordered imaging, lab work, and nerve conduction tests) Review of prior analgesic pharmacotherapies. Reviewing PMP Interpreting ordered tests (e.g., lab work, imaging, nerve conduction tests) Performing post-procedure evaluations, including interpretation of diagnostic procedures Obtaining and/or reviewing separately obtained history Performing a medically appropriate examination and/or evaluation Counseling and educating the patient/family/caregiver Ordering medications, tests, or procedures Referring and communicating with other health care professionals (when not separately reported) Documenting clinical information in the electronic or other health record Independently interpreting results (not separately reported) and communicating results to the patient/ family/caregiver Care coordination (not separately reported)  Note by: Glisson DELENA Tanya, MD (TTS technology used. I apologize for any typographical errors that were not detected and corrected.) Date: 02/16/2024; Time: 12:47 PM

## 2024-02-16 ENCOUNTER — Ambulatory Visit: Attending: Pain Medicine | Admitting: Pain Medicine

## 2024-02-16 VITALS — BP 144/85 | HR 80 | Temp 97.3°F | Resp 16 | Ht 65.5 in | Wt 150.0 lb

## 2024-02-16 DIAGNOSIS — M4323 Fusion of spine, cervicothoracic region: Secondary | ICD-10-CM | POA: Diagnosis not present

## 2024-02-16 DIAGNOSIS — T85848S Pain due to other internal prosthetic devices, implants and grafts, sequela: Secondary | ICD-10-CM | POA: Diagnosis not present

## 2024-02-16 DIAGNOSIS — M549 Dorsalgia, unspecified: Secondary | ICD-10-CM | POA: Diagnosis not present

## 2024-02-16 DIAGNOSIS — M546 Pain in thoracic spine: Secondary | ICD-10-CM | POA: Diagnosis not present

## 2024-02-16 DIAGNOSIS — G894 Chronic pain syndrome: Secondary | ICD-10-CM | POA: Insufficient documentation

## 2024-02-16 DIAGNOSIS — G8929 Other chronic pain: Secondary | ICD-10-CM | POA: Insufficient documentation

## 2024-02-16 DIAGNOSIS — M542 Cervicalgia: Secondary | ICD-10-CM | POA: Diagnosis not present

## 2024-02-16 DIAGNOSIS — Z981 Arthrodesis status: Secondary | ICD-10-CM | POA: Insufficient documentation

## 2024-02-16 DIAGNOSIS — Z9889 Other specified postprocedural states: Secondary | ICD-10-CM | POA: Diagnosis not present

## 2024-02-16 DIAGNOSIS — M5090 Cervical disc disorder, unspecified, unspecified cervical region: Secondary | ICD-10-CM | POA: Insufficient documentation

## 2024-02-16 NOTE — Patient Instructions (Signed)
 ______________________________________________________________________    Opioid Pain Medication Update  To: All patients taking opioid pain medications. (I.e.: hydrocodone , hydromorphone , oxycodone , oxymorphone, morphine , codeine, methadone, tapentadol, tramadol , buprenorphine, fentanyl , etc.)  Re: Updated review of side effects and adverse reactions of opioid analgesics, as well as new information about long term effects of this class of medications.  Direct risks of long-term opioid therapy are not limited to opioid addiction and overdose. Potential medical risks include serious fractures, breathing problems during sleep, hyperalgesia, immunosuppression, chronic constipation, bowel obstruction, myocardial infarction, and tooth decay secondary to xerostomia.  Unpredictable adverse effects that can occur even if you take your medication correctly: Cognitive impairment, respiratory depression, and death. Most people think that if they take their medication correctly, and as instructed, that they will be safe. Nothing could be farther from the truth. In reality, a significant amount of recorded deaths associated with the use of opioids has occurred in individuals that had taken the medication for a long time, and were taking their medication correctly. The following are examples of how this can happen: Patient taking his/her medication for a long time, as instructed, without any side effects, is given a certain antibiotic or another unrelated medication, which in turn triggers a Drug-to-drug interaction leading to disorientation, cognitive impairment, impaired reflexes, respiratory depression or an untoward event leading to serious bodily harm or injury, including death.  Patient taking his/her medication for a long time, as instructed, without any side effects, develops an acute impairment of liver and/or kidney function. This will lead to a rapid inability of the body to breakdown and eliminate  their pain medication, which will result in effects similar to an overdose, but with the same medicine and dose that they had always taken. This again may lead to disorientation, cognitive impairment, impaired reflexes, respiratory depression or an untoward event leading to serious bodily harm or injury, including death.  A similar problem will occur with patients as they grow older and their liver and kidney function begins to decrease as part of the aging process.  Background information: Historically, the original case for using long-term opioid therapy to treat chronic noncancer pain was based on safety assumptions that subsequent experience has called into question. In 1996, the American Pain Society and the American Academy of Pain Medicine issued a consensus statement supporting long-term opioid therapy. This statement acknowledged the dangers of opioid prescribing but concluded that the risk for addiction was low; respiratory depression induced by opioids was short-lived, occurred mainly in opioid-naive patients, and was antagonized by pain; tolerance was not a common problem; and efforts to control diversion should not constrain opioid prescribing. This has now proven to be wrong. Experience regarding the risks for opioid addiction, misuse, and overdose in community practice has failed to support these assumptions.  According to the Centers for Disease Control and Prevention, fatal overdoses involving opioid analgesics have increased sharply over the past decade. Currently, more than 96,700 people die from drug overdoses every year. Opioids are a factor in 7 out of every 10 overdose deaths. Deaths from drug overdose have surpassed motor vehicle accidents as the leading cause of death for individuals between the ages of 80 and 75.  Clinical data suggest that neuroendocrine dysfunction may be very common in both men and women, potentially causing hypogonadism, erectile dysfunction, infertility,  decreased libido, osteoporosis, and depression. Recent studies linked higher opioid dose to increased opioid-related mortality. Controlled observational studies reported that long-term opioid therapy may be associated with increased risk for cardiovascular events. Subsequent  meta-analysis concluded that the safety of long-term opioid therapy in elderly patients has not been proven.   Side Effects and adverse reactions: Common side effects: Drowsiness (sedation). Dizziness. Nausea and vomiting. Constipation. Physical dependence -- Dependence often manifests with withdrawal symptoms when opioids are discontinued or decreased. Tolerance -- As you take repeated doses of opioids, you require increased medication to experience the same effect of pain relief. Respiratory depression -- This can occur in healthy people, especially with higher doses. However, people with COPD, asthma or other lung conditions may be even more susceptible to fatal respiratory impairment.  Uncommon side effects: An increased sensitivity to feeling pain and extreme response to pain (hyperalgesia). Chronic use of opioids can lead to this. Delayed gastric emptying (the process by which the contents of your stomach are moved into your small intestine). Muscle rigidity. Immune system and hormonal dysfunction. Quick, involuntary muscle jerks (myoclonus). Arrhythmia. Itchy skin (pruritus). Dry mouth (xerostomia).  Long-term side effects: Chronic constipation. Sleep-disordered breathing (SDB). Increased risk of bone fractures. Hypothalamic-pituitary-adrenal dysregulation. Increased risk of overdose.  RISKS: Respiratory depression and death: Opioids increase the risk of respiratory depression and death.  Drug-to-drug interactions: Opioids are relatively contraindicated in combination with benzodiazepines, sleep inducers, and other central nervous system depressants. Other classes of medications (i.e.: certain antibiotics  and even over-the-counter medications) may also trigger or induce respiratory depression in some patients.  Medical conditions: Patients with pre-existing respiratory problems are at higher risk of respiratory failure and/or depression when in combination with opioid analgesics. Opioids are relatively contraindicated in some medical conditions such as central sleep apnea.   Fractures and Falls:  Opioids increase the risk and incidence of falls. This is of particular importance in elderly patients.  Endocrine System:  Long-term administration is associated with endocrine abnormalities (endocrinopathies). (Also known as Opioid-induced Endocrinopathy) Influences on both the hypothalamic-pituitary-adrenal axis?and the hypothalamic-pituitary-gonadal axis have been demonstrated with consequent hypogonadism and adrenal insufficiency in both sexes. Hypogonadism and decreased levels of dehydroepiandrosterone sulfate have been reported in men and women. Endocrine effects include: Amenorrhoea in women (abnormal absence of menstruation) Reduced libido in both sexes Decreased sexual function Erectile dysfunction in men Hypogonadisms (decreased testicular function with shrinkage of testicles) Infertility Depression and fatigue Loss of muscle mass Anxiety Depression Immune suppression Hyperalgesia Weight gain Anemia Osteoporosis Patients (particularly women of childbearing age) should avoid opioids. There is insufficient evidence to recommend routine monitoring of asymptomatic patients taking opioids in the long-term for hormonal deficiencies.  Immune System: Human studies have demonstrated that opioids have an immunomodulating effect. These effects are mediated via opioid receptors both on immune effector cells and in the central nervous system. Opioids have been demonstrated to have adverse effects on antimicrobial response and anti-tumour surveillance. Buprenorphine has been demonstrated to have  no impact on immune function.  Opioid Induced Hyperalgesia: Human studies have demonstrated that prolonged use of opioids can lead to a state of abnormal pain sensitivity, sometimes called opioid induced hyperalgesia (OIH). Opioid induced hyperalgesia is not usually seen in the absence of tolerance to opioid analgesia. Clinically, hyperalgesia may be diagnosed if the patient on long-term opioid therapy presents with increased pain. This might be qualitatively and anatomically distinct from pain related to disease progression or to breakthrough pain resulting from development of opioid tolerance. Pain associated with hyperalgesia tends to be more diffuse than the pre-existing pain and less defined in quality. Management of opioid induced hyperalgesia requires opioid dose reduction.  Cancer: Chronic opioid therapy has been associated with an increased risk  of cancer among noncancer patients with chronic pain. This association was more evident in chronic strong opioid users. Chronic opioid consumption causes significant pathological changes in the small intestine and colon. Epidemiological studies have found that there is a link between opium  dependence and initiation of gastrointestinal cancers. Cancer is the second leading cause of death after cardiovascular disease. Chronic use of opioids can cause multiple conditions such as GERD, immunosuppression and renal damage as well as carcinogenic effects, which are associated with the incidence of cancers.   Mortality: Long-term opioid use has been associated with increased mortality among patients with chronic non-cancer pain (CNCP).  Prescription of long-acting opioids for chronic noncancer pain was associated with a significantly increased risk of all-cause mortality, including deaths from causes other than overdose.  Reference: Von Korff M, Kolodny A, Deyo RA, Chou R. Long-term opioid therapy reconsidered. Ann Intern Med. 2011 Sep 6;155(5):325-8. doi:  10.7326/[id number]. PMID: 78106373; PMCID: EFR6719914. Kit JINNY Laurence CINDERELLA Pearley JINNY, Hayward RA, Dunn KM, Jordan KP. Risk of adverse events in patients prescribed long-term opioids: A cohort study in the [place] [employer]. Eur J Pain. 2019 May;23(5):908-922. doi: 10.1002/ejp.1357. Epub 2019 Jan 31. PMID: 69379883. Colameco S, Coren JS, Ciervo CA. Continuous opioid treatment for chronic noncancer pain: a time for moderation in prescribing. Postgrad Med. 2009 Jul;121(4):61-6. doi: 10.3810/pgm.2009.07.2032. PMID: 80358728. Gigi JONELLE Shlomo MILUS Levern IVER Conny RN, Mentor SD, Blazina I, Lonell DASEN, Bougatsos C, Deyo RA. The effectiveness and risks of long-term opioid therapy for chronic pain: a systematic review for a [employer] of [employer] to [employer]. Ann Intern Med. 2015 Feb 17;162(4):276-86. doi: 10.7326/M14-2559. PMID: 74418742. Rory CHRISTELLA Laurence [facility], [place] DM. NCHS Data Brief No. 22. [place]: Centers for Disease Control and Prevention; 2009. Sep, Increase in Fatal Poisonings Involving Opioid Analgesics in the [place] , 1999-2006. Song IA, Choi HR, Oh TK. Long-term opioid use and mortality in patients with chronic non-cancer pain: Ten-year follow-up study in [place] from 2010 through 2019. EClinicalMedicine. 2022 Jul 18;51:101558. doi: 10.1016/j.eclinm.2022.898441. PMID: 64124182; PMCID: EFR0695089. Huser, W., Schubert, T., Vogelmann, T. et al. All-cause mortality in patients with long-term opioid therapy compared with non-opioid analgesics for chronic non-cancer pain: a database study. BMC Med 18, 162 (2020). http://lester.info/ Rashidian H, Zendehdel K, Kamangar F, Malekzadeh R, Haghdoost AA. An Ecological Study of the Association between Opiate Use and Incidence of Cancers. Addict Health. 2016 Fall;8(4):252-260. PMID: 71180443; PMCID: EFR4445194.  Our Goal: Our goal is to control your pain with means other  than the use of opioid pain medications.  Our Recommendation: Talk to your physician about coming off of these medications. We can assist you with the tapering down and stopping these medicines. Based on the new information, even if you cannot completely stop the medication, a decrease in the dose may be associated with a lesser risk. Ask for other means of controlling the pain. Decrease or eliminate those factors that significantly contribute to your pain such as smoking, obesity, and a diet heavily tilted towards inflammatory nutrients.  Last Updated: 10/05/2022   ______________________________________________________________________     ___________________________________________________________________________________  Maximum Recommended Dosing in CKD* (Lyrica   Neurontin )    *Chronic Kidney Disease __________________________________________________________________________________   Renal Dysfunction and opioids [Aronoff 1999; Dean 2004]  Opioid Recommended Comment  Morphine  Use cautiously; adjust dose as appropriate. Metabolites can accumulate causing increased therapeutic and adverse effects.  Hydromorphone / Hydrocodone  Hydrocodone  Use cautiously; adjust dose as appropriate. The 3-glucuronide metabolite can accumulate and cause neuro-excitatory effects.  Oxycodone  Use cautiously  with careful monitoring; adjust dose if necessary. Metabolites and parent drug can accumulate causing toxic and CNS-depressant effects.  Codeine Do not use. Metabolites can accumulate causing adverse effects  Methadone* Appears safe. Metabolites are inactive.  Fentanyl * Appears safe; however, a dose reduction is necessary. No active metabolites and appears to have no added risk of adverse effects; monitor with long term use.  Tramadol  Use cautiously with careful monitoring; adjust dose if necessary. Overdose is known to cause hepatotoxicity. Tramadol  is metabolized by the liver, predominantly by CYP  2D6 and 3A4 to its active form.  * Negligible or no active metabolites; although, not considered first-line therapy.

## 2024-02-16 NOTE — Progress Notes (Signed)
 Safety precautions to be maintained throughout the outpatient stay will include: orient to surroundings, keep bed in low position, maintain call bell within reach at all times, provide assistance with transfer out of bed and ambulation.

## 2024-02-18 DIAGNOSIS — K529 Noninfective gastroenteritis and colitis, unspecified: Secondary | ICD-10-CM | POA: Diagnosis not present

## 2024-02-21 ENCOUNTER — Ambulatory Visit: Admitting: Physical Therapy

## 2024-02-28 ENCOUNTER — Ambulatory Visit: Admitting: Physical Therapy

## 2024-02-28 DIAGNOSIS — N182 Chronic kidney disease, stage 2 (mild): Secondary | ICD-10-CM | POA: Diagnosis not present

## 2024-02-28 DIAGNOSIS — R600 Localized edema: Secondary | ICD-10-CM | POA: Diagnosis not present

## 2024-02-28 DIAGNOSIS — K51918 Ulcerative colitis, unspecified with other complication: Secondary | ICD-10-CM | POA: Diagnosis not present

## 2024-03-06 ENCOUNTER — Encounter: Admitting: Physical Therapy

## 2024-03-06 ENCOUNTER — Ambulatory Visit: Admitting: Physical Therapy

## 2024-03-06 DIAGNOSIS — M4126 Other idiopathic scoliosis, lumbar region: Secondary | ICD-10-CM | POA: Diagnosis not present

## 2024-03-06 DIAGNOSIS — K5190 Ulcerative colitis, unspecified, without complications: Secondary | ICD-10-CM | POA: Diagnosis not present

## 2024-03-06 DIAGNOSIS — M4712 Other spondylosis with myelopathy, cervical region: Secondary | ICD-10-CM | POA: Diagnosis not present

## 2024-03-06 DIAGNOSIS — M4722 Other spondylosis with radiculopathy, cervical region: Secondary | ICD-10-CM | POA: Diagnosis not present

## 2024-03-07 ENCOUNTER — Encounter: Admitting: Physical Therapy

## 2024-03-08 DIAGNOSIS — D485 Neoplasm of uncertain behavior of skin: Secondary | ICD-10-CM | POA: Diagnosis not present

## 2024-03-08 DIAGNOSIS — D2261 Melanocytic nevi of right upper limb, including shoulder: Secondary | ICD-10-CM | POA: Diagnosis not present

## 2024-03-08 DIAGNOSIS — L565 Disseminated superficial actinic porokeratosis (DSAP): Secondary | ICD-10-CM | POA: Diagnosis not present

## 2024-03-08 DIAGNOSIS — Z85828 Personal history of other malignant neoplasm of skin: Secondary | ICD-10-CM | POA: Diagnosis not present

## 2024-03-08 DIAGNOSIS — L570 Actinic keratosis: Secondary | ICD-10-CM | POA: Diagnosis not present

## 2024-03-08 DIAGNOSIS — D225 Melanocytic nevi of trunk: Secondary | ICD-10-CM | POA: Diagnosis not present

## 2024-03-08 DIAGNOSIS — D2262 Melanocytic nevi of left upper limb, including shoulder: Secondary | ICD-10-CM | POA: Diagnosis not present

## 2024-03-13 ENCOUNTER — Encounter: Payer: Self-pay | Admitting: Obstetrics and Gynecology

## 2024-03-13 ENCOUNTER — Ambulatory Visit: Admitting: Obstetrics and Gynecology

## 2024-03-13 DIAGNOSIS — Z87440 Personal history of urinary (tract) infections: Secondary | ICD-10-CM | POA: Diagnosis not present

## 2024-03-13 DIAGNOSIS — N390 Urinary tract infection, site not specified: Secondary | ICD-10-CM

## 2024-03-13 MED ORDER — CEPHALEXIN 250 MG PO TABS: 250.0000 mg | ORAL_TABLET | Freq: Every day | ORAL | 1 refills | Status: AC

## 2024-03-13 NOTE — Patient Instructions (Signed)
 Continue with Keflex  daily for a few more months and continue the estrogen cream twice a week.

## 2024-03-13 NOTE — Progress Notes (Signed)
 [facility] Return Visit  SUBJECTIVE  History of Present Illness: JOLIANA CLAFLIN is a 78 y.o. female seen in follow-up for rUTI. Plan at last visit was do low dose keflex  daily for UTI prevention. She reports she has not had a UTI since she last saw Dr. Marilynne for cystoscopy.   She would like to continue on her prophylaxis through the holiday season as she has done well. Does not always remember her estrogen cream but does her best.    Past Medical History: Patient  has a past medical history of Actinic keratosis, Anemia, Anxiety, Arthritis, Cancer (HCC) (2016), Cataract (lens) fragments in eye following cataract surgery, bilateral (2013), Chronic ulcerative colitis, without complications (HCC) (10/29/2015), Diverticulitis (2018), Esophagitis, reflux (10/04/2014), Family history of adverse reaction to anesthesia, Female stress incontinence (08/09/2013), Foreign body in alimentary tract (07/23/2022), Gastroesophageal reflux disease without esophagitis (08/06/2016), GERD (gastroesophageal reflux disease), Headache, History of hiatal hernia, Hypertension, Hyperthyroidism, Incomplete emptying of bladder (10/29/2013), Migraine (07/26/2013), Pneumonia due to COVID-19 virus (2020), PONV (postoperative nausea and vomiting), Postherpetic neuralgia, Postmenopausal (11/09/2016), PVC (premature ventricular contraction), Scoliosis, Scoliosis (and kyphoscoliosis), idiopathic (07/26/2013), Scoliosis of lumbosacral spine (01/18/2015), Squamous cell carcinoma of skin (03/13/2013), Squamous cell carcinoma of skin (03/10/2018), Squamous cell carcinoma of skin (02/21/2019), Squamous cell carcinoma of skin (06/01/2019), Trochanteric bursitis (11/29/2014), Ulcerative colitis (HCC), Urethral prolapse (08/09/2013), and Urge incontinence (08/09/2013).   Past Surgical History: She  has a past surgical history that includes Carpal tunnel release (Bilateral); Back surgery (03/12/2021); Abdominal hysterectomy;  Colonoscopy (11/06/2005); Esophagogastroduodenoscopy (11/06/2005); Esophagogastroduodenoscopy (egd) with propofol  (N/A, 07/29/2016); Colonoscopy with propofol  (N/A, 07/29/2016); Cholecystectomy (N/A, 08/21/2016); Laparoscopic low anterior resection (11/24/2016); Esophagogastroduodenoscopy (egd) with propofol  (N/A, 02/07/2020); Xi robotic assisted paraesophageal hernia repair (N/A, 02/27/2020); Partial colectomy (2018); Colonoscopy with propofol  (N/A, 08/22/2020); Cataract extraction w/ intraocular lens  implant, bilateral; Colonoscopy with propofol  (N/A, 01/22/2022); Esophagogastroduodenoscopy (egd) with propofol  (N/A, 01/22/2022); polypectomy (01/22/2022); Lumbar laminectomy (2004); Posterior laminectomy / decompression lumbar spine (2009); Colonoscopy (2010); Colonoscopy (2013); Tonsillectomy; Givens capsule study (N/A, 05/07/2022); Knee Arthroplasty (Right, 05/13/2022); Eye surgery; Givens capsule study (N/A, 06/24/2022); Esophagogastroduodenoscopy (N/A, 06/24/2022); Colonoscopy with propofol  (N/A, 07/23/2022); Colonoscopy with propofol  (N/A, 03/01/2023); biopsy (03/01/2023); polypectomy (03/01/2023); and Anterior cervical decompression/discectomy fusion 4 level (N/A, 05/13/2023).   Medications: She has a current medication list which includes the following prescription(s): humira  (2 pen), vitamin c , vitamin d , cholestyramine , docusate sodium , duloxetine , entyvio, estradiol , fluticasone, gabapentin , magnesium , meclizine , methimazole , misc natural products, neomycin -polymyxin b-dexamethasone , oxycodone -acetaminophen , pantoprazole , triamterene -hydrochlorothiazide , vitamin b-12, and cephalexin .   Allergies: Patient is allergic to dilaudid  [hydromorphone ], nsaids, and sulfa antibiotics.   Social History: Patient  reports that she has never smoked. She has never used smokeless tobacco. She reports that she does not drink alcohol and does not use drugs.     OBJECTIVE     Physical Exam: Vitals:   03/13/24 1306   BP: 116/76  Pulse: 83   Gen: No apparent distress, A&O x 3.  Detailed Urogynecologic Evaluation:  Deferred.    ASSESSMENT AND PLAN    Ms. Mercer is a 78 y.o. with:  1. Recurrent urinary tract infection    Patient can continue her keflex  for 6 more months. We will plan for her to stop after this round and then start Methenamine with estrogen cream for prophylaxis, so she will not continue on antibiotics. Encouraged patient to continue her estrogen cream twice a week.   Patient to follow up in 6 months or sooner if needed. Encouraged to call with any UTI symptoms.  Saher Davee G Kenley Troop, NP

## 2024-04-10 ENCOUNTER — Encounter: Payer: Self-pay | Admitting: *Deleted
